# Patient Record
Sex: Female | Born: 1945 | Race: White | Hispanic: No | Smoking: Former smoker
Health system: Southern US, Community
[De-identification: ages and names within clinical notes are randomized; demographics above are authoritative.]

## PROBLEM LIST (undated history)

## (undated) DIAGNOSIS — I10 Essential (primary) hypertension: Secondary | ICD-10-CM

## (undated) DIAGNOSIS — R0600 Dyspnea, unspecified: Secondary | ICD-10-CM

## (undated) DIAGNOSIS — C801 Malignant (primary) neoplasm, unspecified: Secondary | ICD-10-CM

## (undated) DIAGNOSIS — J449 Chronic obstructive pulmonary disease, unspecified: Secondary | ICD-10-CM

## (undated) DIAGNOSIS — K219 Gastro-esophageal reflux disease without esophagitis: Secondary | ICD-10-CM

## (undated) DIAGNOSIS — E119 Type 2 diabetes mellitus without complications: Secondary | ICD-10-CM

## (undated) DIAGNOSIS — E785 Hyperlipidemia, unspecified: Secondary | ICD-10-CM

## (undated) DIAGNOSIS — Z8489 Family history of other specified conditions: Secondary | ICD-10-CM

## (undated) DIAGNOSIS — G43909 Migraine, unspecified, not intractable, without status migrainosus: Secondary | ICD-10-CM

## (undated) DIAGNOSIS — J189 Pneumonia, unspecified organism: Secondary | ICD-10-CM

## (undated) HISTORY — PX: DENTAL SURGERY: SHX609

## (undated) HISTORY — PX: BREAST BIOPSY: SHX20

## (undated) HISTORY — PX: RETINAL TEAR REPAIR CRYOTHERAPY: SHX5304

## (undated) HISTORY — PX: COLON SURGERY: SHX602

## (undated) HISTORY — DX: Chronic obstructive pulmonary disease, unspecified: J44.9

## (undated) HISTORY — PX: BREAST LUMPECTOMY: SHX2

## (undated) HISTORY — DX: Migraine, unspecified, not intractable, without status migrainosus: G43.909

## (undated) HISTORY — DX: Hyperlipidemia, unspecified: E78.5

## (undated) HISTORY — DX: Type 2 diabetes mellitus without complications: E11.9

## (undated) HISTORY — DX: Essential (primary) hypertension: I10

## (undated) HISTORY — PX: BREAST EXCISIONAL BIOPSY: SUR124

---

## 2002-10-17 HISTORY — PX: BUNIONECTOMY: SHX129

## 2010-01-25 LAB — PULMONARY FUNCTION TEST

## 2011-01-04 ENCOUNTER — Encounter: Payer: Self-pay | Admitting: Pulmonary Disease

## 2011-01-05 ENCOUNTER — Encounter: Payer: Self-pay | Admitting: Pulmonary Disease

## 2011-01-05 ENCOUNTER — Ambulatory Visit (INDEPENDENT_AMBULATORY_CARE_PROVIDER_SITE_OTHER): Payer: 59 | Admitting: Pulmonary Disease

## 2011-01-05 DIAGNOSIS — J449 Chronic obstructive pulmonary disease, unspecified: Secondary | ICD-10-CM | POA: Insufficient documentation

## 2011-01-05 DIAGNOSIS — J438 Other emphysema: Secondary | ICD-10-CM

## 2011-01-05 MED ORDER — FLUTICASONE-SALMETEROL 100-50 MCG/DOSE IN AEPB: 1.0000 | INHALATION_SPRAY | Freq: Two times a day (BID) | RESPIRATORY_TRACT | Status: DC

## 2011-01-05 MED ORDER — ALBUTEROL SULFATE HFA 108 (90 BASE) MCG/ACT IN AERS: 2.0000 | INHALATION_SPRAY | Freq: Four times a day (QID) | RESPIRATORY_TRACT | Status: DC | PRN

## 2011-01-05 NOTE — Progress Notes (Signed)
  Subjective:    Patient ID: Yolanda Morris, female    DOB: [birth year], 65 y.o.   MRN: [id number]  HPI The pt is a 64y/o female who I have been asked to see for management of emphysema.  She was diagnosed in 2009 by Dr Gerome Apley in [place], and I have yet to get his records.  She tells me she has "severe" disease.  She has fairly good exercise tolerance, and gets on treadmill everyday.  No sob with ADL's, but will get sob when around cleaning liquids.  She currently is on advair, and rarely uses rescue inhaler.  Is not on oxygen.  She has not had an exac in over a year, and no recent chest infection.  Denies chronic cough  Or LE edema.  Her last pfts were a year ago.   Review of Systems Pt c/o shortness of breath with activity, non-productive cough, sore throat and ear ache.  Pt denies shortness of breath at rest, productive cough, coughing up blood, chest pain, irregular heartbeats, acid heartburn, indigestion, loss of appetite, weight change, abdominal pain, difficulty swallowing, tooth/dental problems, headache, nasal congestion, sneezing, itching, anxiety, depression, hand/feet swelling, joint stiffness or pain, rash or change in color of mucus.      Objective:   Physical Exam  Constitutional: She is oriented to person, place, and time. She appears well-developed and well-nourished.  HENT:  Nose: Nose normal.  Mouth/Throat: Oropharynx is clear and moist. No oropharyngeal exudate.  Eyes: EOM are normal. Pupils are equal, round, and reactive to light. Left eye exhibits no discharge.  Neck: Neck supple. No JVD present. No thyromegaly present.  Cardiovascular: Normal rate, regular rhythm and normal heart sounds.  Exam reveals no gallop and no friction rub.   No murmur heard. Pulmonary/Chest: Effort normal and breath sounds normal. No respiratory distress. She has no wheezes. She has no rales.  Abdominal: Soft. Bowel sounds are normal. There is no tenderness.  Musculoskeletal: She exhibits no  edema.  Lymphadenopathy:    She has no cervical adenopathy.  Neurological: She is alert and oriented to person, place, and time.       Moves all 4 without obvious deficits  Skin:       No cyanosis           Assessment & Plan:  Emphysema The pt has a history of significant emphysema by her history, but I have yet to get records from her prior pulmonologist.  She is doing quite well on current meds, with last exacerb over a year ago.  She has not been tried on spiriva, and may be worthwhile giving her a short trial on this med.  She is exercising on her own, but I have asked her to consider pulm rehab program at [facility]  Overall, is doing well from a QOL standpoint.  Will also need to see if she has had an AAT level sent in the past?

## 2011-01-05 NOTE — Progress Notes (Deleted)
  Subjective:    Patient ID: Yolanda Morris, female    DOB: 10-19-1945, 65 y.o.   MRN: [id number]  HPI    Review of Systems Pt c/o shortness of breath with activity, non-productive cough, sore throat and ear ache. Pt denies shortness of breath at rest, productive cough, coughing up blood, chest pain, irregular heartbeats, acid heartburn, indigestion, loss of appetite, weight change, abdominal pain, difficulty swallowing, tooth/dental problems, headache, nasal congestion, sneezing, itching, anxiety, depression, hand/feet swelling, joint stiffness or pain, rash or change in color of mucus.     Objective:   Physical Exam        Assessment & Plan:

## 2011-01-05 NOTE — Progress Notes (Signed)
Addended by: Arman Filter on: 01/05/2011 11:37 AM   Modules accepted: Orders

## 2011-01-05 NOTE — Assessment & Plan Note (Signed)
The pt has a history of significant emphysema by her history, but I have yet to get records from her prior pulmonologist.  She is doing quite well on current meds, with last exacerb over a year ago.  She has not been tried on spiriva, and may be worthwhile giving her a short trial on this med.  She is exercising on her own, but I have asked her to consider pulm rehab program at [facility].  Overall, is doing well from a QOL standpoint.  Will also need to see if she has had an AAT level sent in the past?

## 2011-01-05 NOTE — Patient Instructions (Addendum)
Continue with current advair  Trial of spiriva one inhalation each am.  Call if helpful and we can send in script Think about pulmonary rehab program at [place]. Work on weight loss followup with me in 6mos, but sooner if having problems.  I will call if something additional needs to be done after getting records from Dr. Gerome Apley.

## 2011-01-07 ENCOUNTER — Telehealth: Payer: Self-pay | Admitting: Pulmonary Disease

## 2011-01-07 NOTE — Telephone Encounter (Signed)
Megan please advise if you have received these records [facility], [place]

## 2011-01-07 NOTE — Telephone Encounter (Signed)
Called and spoke with pt.  Informed her I still haven't received records from Dr. Eulis Foster.  Refaxed ROI to Dr. Eulis Foster office's. Informed pt I will call her once I receive these records.  Pt states she does have some records at home and will check to see if any of it is records from Dr. Eulis Foster and then will call me back and let me know.

## 2011-01-11 ENCOUNTER — Telehealth: Payer: Self-pay | Admitting: *Deleted

## 2011-01-11 NOTE — Telephone Encounter (Signed)
LMOM to inform pt we finally received records from [facility].

## 2011-01-11 NOTE — Telephone Encounter (Signed)
Pt called back. Informed her we received records.  Nothing further needed.

## 2011-01-12 ENCOUNTER — Telehealth: Payer: Self-pay | Admitting: Pulmonary Disease

## 2011-01-13 ENCOUNTER — Telehealth: Payer: Self-pay | Admitting: Pulmonary Disease

## 2011-01-13 NOTE — Telephone Encounter (Signed)
.... (  cont. From prev documentation)  Pt states she will check through her records she has at home to see if Dr. Eulis Foster checked an AAT.  Pt states she will call me if she finds this results.  Otherwise, pt states she will come to the lab tomorrow to have bloodwork drawn.

## 2011-01-13 NOTE — Telephone Encounter (Signed)
[id number] 

## 2011-01-13 NOTE — Telephone Encounter (Signed)
Returning Megans call. 

## 2011-01-13 NOTE — Telephone Encounter (Signed)
Spoke with pt and she states that she never had alpha1 done and so she will come to the lab tomorrow am and have this done as per previous conversation with [place].

## 2011-01-13 NOTE — Telephone Encounter (Signed)
Called and spoke with pt.  Pt aware of KC's recs.  Pt thought Dr. Eulis Foster did an AAT level on her.  I informed pt the records that Dr. Eulis Foster sent didn't have an AAT.  Pt states she will check

## 2011-01-14 ENCOUNTER — Other Ambulatory Visit: Payer: 59

## 2011-01-14 ENCOUNTER — Other Ambulatory Visit: Payer: Self-pay | Admitting: Pulmonary Disease

## 2011-01-18 LAB — ALPHA-1-ANTITRYPSIN: A-1 Antitrypsin, Ser: 117 mg/dL (ref 83–200)

## 2011-01-19 ENCOUNTER — Encounter: Payer: Self-pay | Admitting: Pulmonary Disease

## 2011-01-26 ENCOUNTER — Telehealth: Payer: Self-pay | Admitting: *Deleted

## 2011-01-26 MED ORDER — TIOTROPIUM BROMIDE MONOHYDRATE 18 MCG IN CAPS: 18.0000 ug | ORAL_CAPSULE | Freq: Every day | RESPIRATORY_TRACT | Status: DC

## 2011-01-26 NOTE — Telephone Encounter (Signed)
Called and spoke with pt.  Pt aware rx sent to [facility] for 90 day supply. And to keep 6 month f/u appt but call if any issues arise.

## 2011-01-26 NOTE — Telephone Encounter (Signed)
Per KC, need to inform pt AAT results were normal. [id number]

## 2011-01-26 NOTE — Telephone Encounter (Signed)
Called and spoke with pt. Pt aware of AAT results.  Pt also wanted to give [facility] an update.  Pt states she was started on Spiriva at last ov and was told to call back and give update.  Pt states she feels the spiriva is helping her breathing.  Pt states she is has joined an exercise group with her church and has noticed she is able to stay on the treadmill longer.  Therefore, pt is requesting rx for Spiriva (90 day supply) be sent to [facility], please advise if this is ok or not.  Thank you.

## 2011-01-26 NOTE — Telephone Encounter (Signed)
Ok to call in spiriva.  Tell her to keep 6mos f/u apptm, and let me know if having issues.

## 2011-06-09 ENCOUNTER — Other Ambulatory Visit (HOSPITAL_COMMUNITY)
Admission: RE | Admit: 2011-06-09 | Discharge: 2011-06-09 | Disposition: A | Discharge status: Home / Self Care | Payer: Medicare Other | Source: Ambulatory Visit | Attending: Family Medicine | Admitting: Family Medicine

## 2011-06-09 DIAGNOSIS — Z0000 Encounter for general adult medical examination without abnormal findings: Secondary | ICD-10-CM | POA: Insufficient documentation

## 2011-07-08 ENCOUNTER — Telehealth: Payer: Self-pay | Admitting: Pulmonary Disease

## 2011-07-08 ENCOUNTER — Encounter: Payer: Self-pay | Admitting: Pulmonary Disease

## 2011-07-08 ENCOUNTER — Ambulatory Visit (INDEPENDENT_AMBULATORY_CARE_PROVIDER_SITE_OTHER): Payer: Medicare Other | Admitting: Pulmonary Disease

## 2011-07-08 DIAGNOSIS — J209 Acute bronchitis, unspecified: Secondary | ICD-10-CM | POA: Insufficient documentation

## 2011-07-08 DIAGNOSIS — J438 Other emphysema: Secondary | ICD-10-CM

## 2011-07-08 MED ORDER — CEFDINIR 300 MG PO CAPS: ORAL_CAPSULE | ORAL | Status: DC

## 2011-07-08 MED ORDER — TIOTROPIUM BROMIDE MONOHYDRATE 18 MCG IN CAPS: 18.0000 ug | ORAL_CAPSULE | Freq: Every day | RESPIRATORY_TRACT | Status: DC

## 2011-07-08 MED ORDER — HYDROCODONE-HOMATROPINE 5-1.5 MG/5ML PO SYRP: 5.0000 mL | ORAL_SOLUTION | Freq: Four times a day (QID) | ORAL | Status: AC | PRN

## 2011-07-08 MED ORDER — FLUTICASONE-SALMETEROL 100-50 MCG/DOSE IN AEPB: 1.0000 | INHALATION_SPRAY | Freq: Two times a day (BID) | RESPIRATORY_TRACT | Status: DC

## 2011-07-08 NOTE — Telephone Encounter (Signed)
Pt is aware prescriptions have been sent to [facility] as requested.

## 2011-07-08 NOTE — Assessment & Plan Note (Signed)
The patient had been doing very well from an emphysema standpoint on her current meds prior to getting sick.  I've asked her to stay on her current medical regimen, and to followup with me in 6 months.  She has already received her flu shot this fall from her primary care physician.

## 2011-07-08 NOTE — Progress Notes (Signed)
  Subjective:    Patient ID: Yolanda Morris, female    DOB: 19-Nov-1945, 65 y.o.   MRN: [id number]  HPI Patient comes in today for followup of her known emphysema.  She been doing very well on her Advair and Spiriva for the summer, and had not had an acute exacerbation or pulmonary infection.  2 weeks ago she developed a runny nose and sore throat, followed by chest congestion, and now cough with purulent mucus.  She has been trying over-the-counter medications at home without improvement.  She denies any fevers, chills, or sweats.  She has noticed only some increasing shortness of breath since being sick.   Review of Systems  Constitutional: Negative for fever and unexpected weight change.  HENT: Positive for sore throat, rhinorrhea and sneezing. Negative for ear pain, nosebleeds, congestion, trouble swallowing, dental problem, postnasal drip and sinus pressure.   Eyes: Negative for redness and itching.  Respiratory: Positive for cough, chest tightness, shortness of breath and wheezing.   Cardiovascular: Negative for palpitations and leg swelling.  Gastrointestinal: Negative for nausea and vomiting.  Genitourinary: Negative for dysuria.  Musculoskeletal: Negative for joint swelling.  Skin: Negative for rash.  Neurological: Positive for headaches.  Hematological: Bruises/bleeds easily.  Psychiatric/Behavioral: Negative for dysphoric mood. The patient is not nervous/anxious.        Objective:   Physical Exam Well-developed female in no acute distress Nose without purulence or discharge, oropharynx clear Chest with mild decrease in breath sounds, no wheezes or rhonchi Cardiac exam with regular rate and rhythm Lower extremities without edema, no cyanosis noted Alert and oriented, moves all 4 extremities.       Assessment & Plan:

## 2011-07-08 NOTE — Assessment & Plan Note (Signed)
The patient has had a congested cough for the last 2 weeks, and now productive of yellow mucus.  More than likely this is a bacterial infection given its length of time, and I would also lean toward treatment with an antibiotic given her underlying lung disease.  We'll give her 5 days of Omnicef, and she is to call us if she does not see improvement.

## 2011-07-08 NOTE — Patient Instructions (Addendum)
Will treat with omnicef 300mg  two each am for next 5 days Can use cough syrup as needed for cough at night No change in breathing meds. followup with me in 6mos.

## 2011-07-12 ENCOUNTER — Telehealth: Payer: Self-pay | Admitting: Pulmonary Disease

## 2011-07-12 MED ORDER — PREDNISONE 10 MG PO TABS: ORAL_TABLET | ORAL | Status: DC

## 2011-07-12 MED ORDER — LEVOFLOXACIN 750 MG PO TABS: 750.0000 mg | ORAL_TABLET | Freq: Every day | ORAL | Status: DC

## 2011-07-12 MED ORDER — LEVOFLOXACIN 750 MG PO TABS: 750.0000 mg | ORAL_TABLET | Freq: Every day | ORAL | Status: AC

## 2011-07-12 NOTE — Telephone Encounter (Signed)
Error.Yolanda Morris ° °

## 2011-07-12 NOTE — Telephone Encounter (Signed)
Ok to call in levquin 750mg  one a day for 5 days Prednisone 10mg , 4 each day for 2 days, then 3 each day for 2 days, then 2 each day for 2 days.  Then stop

## 2011-07-12 NOTE — Telephone Encounter (Signed)
Spoke with pt and notified of recs per [facility]. She verbalized understanding and rxs were sent to pharm.

## 2011-07-12 NOTE — Telephone Encounter (Signed)
levaquin and pred taper were sent earlier to mail pharm, [facility].  I have now resent these to [facility] on [address].  I called pt and informed her rxs have now been sent to [facility] on [address] and will call [facility] and cancel the rxs so they will not be mailed.  I apologized for this inconvenience.  She verbalized understanding and was ok with this.  I called [facility] and spoke with Bernette Redbird, Pharmacist.  I advised to pls cancel the rxs for levaquin and pred taper as they were sent in error.  He verbalized understanding of this.

## 2011-07-12 NOTE — Telephone Encounter (Signed)
Spoke with pt. She was last seen on 07/08/11- was txed with 5 days of omnicef, last dose is today and she states cough no better. Still producing sputum, moderate amount that has changed from yellow to green in color. No fever, wheeze, or change in her breathing. She states feels achy, but this is no change since seen last wk. She states will come in for ov if [facility] thinks necc, but prefers not to since just seen. KC, pls advise, thanks! Allergies  Allergen Reactions  . Bacitracin     rash

## 2011-12-05 ENCOUNTER — Other Ambulatory Visit: Payer: Self-pay | Admitting: Family Medicine

## 2011-12-05 DIAGNOSIS — Z1231 Encounter for screening mammogram for malignant neoplasm of breast: Secondary | ICD-10-CM

## 2011-12-06 DIAGNOSIS — H2510 Age-related nuclear cataract, unspecified eye: Secondary | ICD-10-CM | POA: Diagnosis not present

## 2011-12-27 ENCOUNTER — Ambulatory Visit
Admission: RE | Admit: 2011-12-27 | Discharge: 2011-12-27 | Disposition: A | Discharge status: Home / Self Care | Payer: Medicare Other | Source: Ambulatory Visit | Attending: Family Medicine | Admitting: Family Medicine

## 2011-12-27 DIAGNOSIS — Z1231 Encounter for screening mammogram for malignant neoplasm of breast: Secondary | ICD-10-CM

## 2012-01-06 ENCOUNTER — Encounter: Payer: Self-pay | Admitting: Pulmonary Disease

## 2012-01-06 ENCOUNTER — Ambulatory Visit (INDEPENDENT_AMBULATORY_CARE_PROVIDER_SITE_OTHER): Payer: Medicare Other | Admitting: Pulmonary Disease

## 2012-01-06 DIAGNOSIS — J438 Other emphysema: Secondary | ICD-10-CM | POA: Diagnosis not present

## 2012-01-06 NOTE — Patient Instructions (Signed)
Will increase advair to 250/50 strength as a trial.  Let me know if works well for you, and we can send in prescription Ok to try coming off spiriva to see how you respond. Try to stay as active as possible, work on weight loss followup with me in 6mos.

## 2012-01-06 NOTE — Assessment & Plan Note (Signed)
The patient has done very well since her last visit, and feels that her breathing is at baseline.  She is asking about the cost of her medications, and whether she can get by on one medication alone.  I discussed with her the treatment of COPD and medical guidelines, and would try discontinuing the Spiriva to see if she can get by on Advair alone.  However, I think she needs to be on the 250/50 strength if she wishes to do this.  She will let me know how the trial goes.

## 2012-01-06 NOTE — Progress Notes (Signed)
  Subjective:    Patient ID: Yolanda Morris, female    DOB: [birth year], 66 y.o.   MRN: [id number]  HPI The patient comes in today for followup of her known COPD.  She has done fairly well since the last visit, and has not had a chest infection or acute exacerbation since that time.  She notices that her breathing is not as good when temperatures are very cold outside, and I discussed with her using a scarf or cloth of some kind to warm the air.    Review of Systems  Constitutional: Negative for fever and unexpected weight change.  HENT: Negative for ear pain, nosebleeds, congestion, sore throat, rhinorrhea, sneezing, trouble swallowing, dental problem, postnasal drip and sinus pressure.   Eyes: Positive for itching. Negative for redness.  Respiratory: Positive for shortness of breath. Negative for cough, chest tightness and wheezing.   Cardiovascular: Negative for palpitations and leg swelling.  Gastrointestinal: Negative for nausea and vomiting.  Genitourinary: Negative for dysuria.  Musculoskeletal: Negative for joint swelling.  Skin: Negative for rash.  Neurological: Positive for headaches.  Hematological: Bruises/bleeds easily.  Psychiatric/Behavioral: Negative for dysphoric mood. The patient is not nervous/anxious.        Objective:   Physical Exam Overweight female in no acute distress Nose without purulence or discharge noted Chest with mild decreased breath sounds, but adequate air flow, no wheezing Cardiac exam with regular rate and rhythm Lower extremities without edema, cyanosis Alert and oriented, moves all 4 extremities.       Assessment & Plan:

## 2012-01-09 ENCOUNTER — Telehealth: Payer: Self-pay | Admitting: Pulmonary Disease

## 2012-01-09 NOTE — Telephone Encounter (Signed)
I spoke with pt and she states when she was here on 01/06/12 she forgot to mention that she just received a 3 month supply of the advair 150. She is wanting to know can she finish out this supply or does KC want her to go ahead and start the advair 250. Please advise Dr. Shelle Iron, thanks

## 2012-01-09 NOTE — Telephone Encounter (Signed)
It is ok for her to use up the supply of lower dose advair, but if her breathing worsens off spiriva, need to get on the advair 250/50.

## 2012-01-09 NOTE — Telephone Encounter (Signed)
Yolanda Morris spoke with pt.

## 2012-01-09 NOTE — Telephone Encounter (Signed)
I spoke with pt and is aware of KC recs. She voiced her understanding and had no questions 

## 2012-01-12 DIAGNOSIS — I10 Essential (primary) hypertension: Secondary | ICD-10-CM | POA: Diagnosis not present

## 2012-01-12 DIAGNOSIS — E785 Hyperlipidemia, unspecified: Secondary | ICD-10-CM | POA: Diagnosis not present

## 2012-01-12 DIAGNOSIS — J449 Chronic obstructive pulmonary disease, unspecified: Secondary | ICD-10-CM | POA: Diagnosis not present

## 2012-01-12 DIAGNOSIS — E119 Type 2 diabetes mellitus without complications: Secondary | ICD-10-CM | POA: Diagnosis not present

## 2012-01-12 DIAGNOSIS — Z86010 Personal history of colonic polyps: Secondary | ICD-10-CM | POA: Diagnosis not present

## 2012-02-15 ENCOUNTER — Telehealth: Payer: Self-pay | Admitting: Pulmonary Disease

## 2012-02-15 NOTE — Telephone Encounter (Signed)
I do not want her to be OFF advair She can restart the spiriva, but I think she needs to be either on the 250/50 or 100/50 of advair at the same time.

## 2012-02-15 NOTE — Telephone Encounter (Signed)
I spoke with pt and she states since being on the lower dose of advair 150 she has noticed her breathing has became worse. This morning she restarted the spiriva and has a 30 day supply of this to see if this will help. She states she is going to take this and once this finishes she will start he samples of the advair 250-50. Pt states she just wanted to give [facility] an heads up on what she is doing. Will forward to [facility] so he is aware.

## 2012-02-15 NOTE — Telephone Encounter (Signed)
I spoke with pt and she states she will keep taking the advair 100-50 with the spiriva this month and then in June she is going to start the advair 250-50 since she has enough samples for one month. She states the reason is bc the insurance will cover the advair 250-50 starting July. Will forward back to [facility] as an [place]

## 2012-02-16 NOTE — Telephone Encounter (Signed)
Because this is an FYI, will sign and forward to [facility].

## 2012-03-23 ENCOUNTER — Telehealth: Payer: Self-pay | Admitting: Pulmonary Disease

## 2012-03-23 MED ORDER — FLUTICASONE-SALMETEROL 250-50 MCG/DOSE IN AEPB: 1.0000 | INHALATION_SPRAY | Freq: Two times a day (BID) | RESPIRATORY_TRACT | Status: DC

## 2012-03-23 NOTE — Telephone Encounter (Signed)
Patient is requesting RX for Advair 250/50 which she has been taking along with the Spiriva and is doing well. She requests new RX be sent to [facility] for 90 day supply with refills. This has been done for the pt.

## 2012-04-12 DIAGNOSIS — Z09 Encounter for follow-up examination after completed treatment for conditions other than malignant neoplasm: Secondary | ICD-10-CM | POA: Diagnosis not present

## 2012-04-12 DIAGNOSIS — Z86010 Personal history of colonic polyps: Secondary | ICD-10-CM | POA: Diagnosis not present

## 2012-06-01 DIAGNOSIS — N390 Urinary tract infection, site not specified: Secondary | ICD-10-CM | POA: Diagnosis not present

## 2012-06-01 DIAGNOSIS — R350 Frequency of micturition: Secondary | ICD-10-CM | POA: Diagnosis not present

## 2012-06-01 DIAGNOSIS — J449 Chronic obstructive pulmonary disease, unspecified: Secondary | ICD-10-CM | POA: Diagnosis not present

## 2012-07-09 ENCOUNTER — Ambulatory Visit: Payer: Medicare Other | Admitting: Pulmonary Disease

## 2012-07-10 DIAGNOSIS — Z23 Encounter for immunization: Secondary | ICD-10-CM | POA: Diagnosis not present

## 2012-07-10 DIAGNOSIS — E785 Hyperlipidemia, unspecified: Secondary | ICD-10-CM | POA: Diagnosis not present

## 2012-07-10 DIAGNOSIS — I10 Essential (primary) hypertension: Secondary | ICD-10-CM | POA: Diagnosis not present

## 2012-07-10 DIAGNOSIS — J449 Chronic obstructive pulmonary disease, unspecified: Secondary | ICD-10-CM | POA: Diagnosis not present

## 2012-07-10 DIAGNOSIS — E119 Type 2 diabetes mellitus without complications: Secondary | ICD-10-CM | POA: Diagnosis not present

## 2012-07-10 DIAGNOSIS — Z124 Encounter for screening for malignant neoplasm of cervix: Secondary | ICD-10-CM | POA: Diagnosis not present

## 2012-07-10 DIAGNOSIS — Z0000 Encounter for general adult medical examination without abnormal findings: Secondary | ICD-10-CM | POA: Diagnosis not present

## 2012-07-10 DIAGNOSIS — Z1331 Encounter for screening for depression: Secondary | ICD-10-CM | POA: Diagnosis not present

## 2012-07-10 DIAGNOSIS — M810 Age-related osteoporosis without current pathological fracture: Secondary | ICD-10-CM | POA: Diagnosis not present

## 2012-07-11 ENCOUNTER — Encounter: Payer: Self-pay | Admitting: Pulmonary Disease

## 2012-07-11 ENCOUNTER — Ambulatory Visit (INDEPENDENT_AMBULATORY_CARE_PROVIDER_SITE_OTHER): Payer: Medicare Other | Admitting: Pulmonary Disease

## 2012-07-11 VITALS — BP 120/64 | HR 71 | Temp 97.5°F | Ht 64.0 in | Wt 157.6 lb

## 2012-07-11 DIAGNOSIS — J449 Chronic obstructive pulmonary disease, unspecified: Secondary | ICD-10-CM

## 2012-07-11 NOTE — Progress Notes (Signed)
  Subjective:    Patient ID: Yolanda Morris, female    DOB: 13-Dec-1945, 66 y.o.   MRN: [id number]  HPI The patient comes in today for followup of her known COPD.  She has been fairly stable since the last visit, but did have to stop taking Spiriva because of medication cost.  She has seen a difference in her breathing and mucus production since being off the medication.  She has been trying to stay active, and is interested in attending pulmonary rehabilitation.  She is also noting some postnasal drip with a cough productive of slightly tinted mucus.  She has not had an acute exacerbation since the last visit.  She has received a flu shot from her primary care physician.   Review of Systems  Constitutional: Negative for fever and unexpected weight change.  HENT: Positive for rhinorrhea, sneezing and postnasal drip. Negative for ear pain, nosebleeds, congestion, sore throat, trouble swallowing, dental problem and sinus pressure.   Eyes: Negative for redness and itching.  Respiratory: Positive for cough and shortness of breath. Negative for chest tightness and wheezing.   Cardiovascular: Negative for palpitations and leg swelling.  Gastrointestinal: Negative for nausea and vomiting.  Genitourinary: Negative for dysuria.  Musculoskeletal: Negative for joint swelling.  Skin: Negative for rash.  Neurological: Positive for headaches.  Hematological: Does not bruise/bleed easily.  Psychiatric/Behavioral: Negative for dysphoric mood. The patient is not nervous/anxious.        Objective:   Physical Exam Well-developed female in no acute distress Nose without purulence or discharge noted Chest with decreased breath sounds throughout, no wheezing Cardiac exam with regular rate and rhythm Lower extremities without significant edema, no cyanosis Alert and oriented, moves all 4 extremities.       Assessment & Plan:

## 2012-07-11 NOTE — Assessment & Plan Note (Addendum)
PFT's 2011:  FEV1 1.25 (54%), ratio 52, +airtrapping, DLCO 77% Cleda Daub 06/2012:  FEV1 1.00 (43%), ratio 51%   The patient is maintaining a reasonable baseline on Advair alone, but clearly had less symptoms when Spiriva was added to to this medication.  Unfortunately, she will not qualify for any patient assistance programs, and she will let me know when she can possibly afford going back on Spiriva with her Advair.  I will refer her to pulmonary rehabilitation, and I've asked her to work on weight loss as well. Her spiro today shows a decline in FEV1, but only a little more than expected for natural aging.

## 2012-07-11 NOTE — Patient Instructions (Addendum)
Stay on advair Will refer to pulmonary rehab at [place] Work on weight reduction followup with me in 6mos.

## 2012-07-12 DIAGNOSIS — Z23 Encounter for immunization: Secondary | ICD-10-CM | POA: Diagnosis not present

## 2012-07-14 DIAGNOSIS — R21 Rash and other nonspecific skin eruption: Secondary | ICD-10-CM | POA: Diagnosis not present

## 2012-08-06 DIAGNOSIS — J449 Chronic obstructive pulmonary disease, unspecified: Secondary | ICD-10-CM | POA: Diagnosis not present

## 2012-08-06 DIAGNOSIS — J40 Bronchitis, not specified as acute or chronic: Secondary | ICD-10-CM | POA: Diagnosis not present

## 2012-09-19 ENCOUNTER — Encounter (HOSPITAL_COMMUNITY): Payer: Self-pay

## 2012-09-19 ENCOUNTER — Encounter (HOSPITAL_COMMUNITY)
Admission: RE | Admit: 2012-09-19 | Discharge: 2012-09-19 | Disposition: A | Discharge status: Home / Self Care | Payer: Medicare Other | Source: Ambulatory Visit | Attending: Pulmonary Disease | Admitting: Pulmonary Disease

## 2012-09-19 DIAGNOSIS — R0602 Shortness of breath: Secondary | ICD-10-CM | POA: Insufficient documentation

## 2012-09-19 DIAGNOSIS — Z5189 Encounter for other specified aftercare: Secondary | ICD-10-CM | POA: Insufficient documentation

## 2012-09-19 DIAGNOSIS — R05 Cough: Secondary | ICD-10-CM | POA: Insufficient documentation

## 2012-09-19 DIAGNOSIS — R059 Cough, unspecified: Secondary | ICD-10-CM | POA: Insufficient documentation

## 2012-09-19 DIAGNOSIS — J45909 Unspecified asthma, uncomplicated: Secondary | ICD-10-CM | POA: Insufficient documentation

## 2012-09-19 NOTE — Progress Notes (Addendum)
Yolanda Morris came today for orientation to Pulmonary Rehab.  On arrival, she was very hoarse, talking in whispers,  Having increased cough, non productive.  She also has had some general aching and more dyspnea with exertion.   Dr. Teddy Spike office notified of the above and appointment made with Yolanda Morris for tomorrow at 10:45 am.  Yolanda Morris has not used her rescue inhaler and does not have it with her today.  She was instructed to used when she returned home, use saline gargle and nasal irrigation and rest.  We did review her health history and medications,  Demonstration and practice of PLB using pulse oximeter.  Patient able to return demonstration satisfactorily. Safety and hand hygiene in the exercise area reviewed with patient.  Patient voices understanding.   Goals were set for Pulmonary Rehab.  We did not do 6 min walk test today, will do on her first day of exercise.  She is advised to wait at least one week until she if fully recovered from this current illness.  We look forward to working with this very nice lady.   Total time one on one 55 minutes.  Cathie Olden RN

## 2012-09-20 ENCOUNTER — Ambulatory Visit (INDEPENDENT_AMBULATORY_CARE_PROVIDER_SITE_OTHER): Payer: Medicare Other | Admitting: Adult Health

## 2012-09-20 ENCOUNTER — Encounter: Payer: Self-pay | Admitting: Adult Health

## 2012-09-20 ENCOUNTER — Encounter (HOSPITAL_COMMUNITY): Admission: RE | Admit: 2012-09-20 | Payer: Medicare Other | Source: Ambulatory Visit

## 2012-09-20 VITALS — BP 134/78 | HR 87 | Temp 97.6°F | Ht 63.5 in | Wt 153.0 lb

## 2012-09-20 DIAGNOSIS — J449 Chronic obstructive pulmonary disease, unspecified: Secondary | ICD-10-CM | POA: Diagnosis not present

## 2012-09-20 MED ORDER — AMOXICILLIN-POT CLAVULANATE 875-125 MG PO TABS: 1.0000 | ORAL_TABLET | Freq: Two times a day (BID) | ORAL | Status: AC

## 2012-09-20 MED ORDER — PREDNISONE 10 MG PO TABS: ORAL_TABLET | ORAL | Status: DC

## 2012-09-20 NOTE — Assessment & Plan Note (Signed)
Exacerbation  xopenex in office given   Plan  Augmentin 875mg  Twice daily  For 7 days  Mucinex DM Twice daily  For cough/congestion  Prednisone taper over next week.  Fluids and rest .  Saline nasal rinses As needed   Please contact office for sooner follow up if symptoms do not improve or worsen or seek emergency care  follow up Dr. Shelle Iron as planned and As needed

## 2012-09-20 NOTE — Progress Notes (Signed)
  Subjective:    Patient ID: Yolanda Morris, female    DOB: Sep 01, 1946, 66 y.o.   MRN: [id number]  HPI 66 yr old female with known hx of COPD   09/20/2012 Acute OV  Complains of increased SOB, tightness in chest, prod cough with dark yellow/brown mucus, sore throat, head congestion w/ PND, hoarseness x4days - denies wheezing, f/c/s Mucinex is not helping.  Cough and congestion are getting worse.  No fever , chest pain , hemoptysis .     Review of Systems Constitutional:   No  weight loss, night sweats,  Fevers, chills, fatigue, or  lassitude.  HEENT:   No headaches,  Difficulty swallowing,  Tooth/dental problems, or  Sore throat,                No sneezing, itching, ear ache,  +nasal congestion, post nasal drip,   CV:  No chest pain,  Orthopnea, PND, swelling in lower extremities, anasarca, dizziness, palpitations, syncope.   GI  No heartburn, indigestion, abdominal pain, nausea, vomiting, diarrhea, change in bowel habits, loss of appetite, bloody stools.   Resp:    No coughing up of blood.   No chest wall deformity  Skin: no rash or lesions.  GU: no dysuria, change in color of urine, no urgency or frequency.  No flank pain, no hematuria   MS:  No joint pain or swelling.  No decreased range of motion.     Psych:  No change in mood or affect. No depression or anxiety.  No memory loss.        Objective:   Physical Exam GEN: A/Ox3; pleasant , NAD, well nourished   HEENT:  [place]/AT,  EACs-clear, TMs-wnl, NOSE-clear drainage THROAT-clear, no lesions, no postnasal drip or exudate noted.   NECK:  Supple w/ fair ROM; no JVD; normal carotid impulses w/o bruits; no thyromegaly or nodules palpated; no lymphadenopathy.  RESP  Coarse Rhonchi no accessory muscle use, no dullness to percussion  CARD:  RRR, no m/r/g  , no peripheral edema, pulses intact, no cyanosis or clubbing.  GI:   Soft & nt; nml bowel sounds; no organomegaly or masses detected.  Musco: Warm bil, no deformities  or joint swelling noted.   Neuro: alert, no focal deficits noted.    Skin: Warm, no lesions or rashes          Assessment & Plan:

## 2012-09-20 NOTE — Patient Instructions (Addendum)
Augmentin 875mg  Twice daily  For 7 days  Mucinex DM Twice daily  For cough/congestion  Prednisone taper over next week.  Fluids and rest .  Saline nasal rinses As needed   Please contact office for sooner follow up if symptoms do not improve or worsen or seek emergency care  follow up Dr. Shelle Iron as planned and As needed

## 2012-09-21 NOTE — Progress Notes (Signed)
Ov reviewed, and agree with above.

## 2012-09-25 ENCOUNTER — Encounter (HOSPITAL_COMMUNITY): Payer: Medicare Other

## 2012-09-27 ENCOUNTER — Encounter (HOSPITAL_COMMUNITY)
Admission: RE | Admit: 2012-09-27 | Discharge: 2012-09-27 | Disposition: A | Discharge status: Home / Self Care | Payer: Medicare Other | Source: Ambulatory Visit | Attending: Pulmonary Disease | Admitting: Pulmonary Disease

## 2012-09-27 DIAGNOSIS — Z5189 Encounter for other specified aftercare: Secondary | ICD-10-CM | POA: Diagnosis not present

## 2012-09-27 DIAGNOSIS — R05 Cough: Secondary | ICD-10-CM | POA: Diagnosis not present

## 2012-09-27 DIAGNOSIS — J45909 Unspecified asthma, uncomplicated: Secondary | ICD-10-CM | POA: Diagnosis not present

## 2012-09-27 DIAGNOSIS — R0602 Shortness of breath: Secondary | ICD-10-CM | POA: Diagnosis not present

## 2012-09-27 NOTE — Progress Notes (Signed)
First day of exercise in Pulmonary Rehab.  She had a delayed start due to exacerbation.  She is still somewhat hoarse.  Exercise limited to low level.  Tolerated well.  We will continue to encourage and support.  Cathie Olden RN

## 2012-10-02 ENCOUNTER — Encounter (HOSPITAL_COMMUNITY)
Admission: RE | Admit: 2012-10-02 | Discharge: 2012-10-02 | Disposition: A | Discharge status: Home / Self Care | Payer: Medicare Other | Source: Ambulatory Visit | Attending: Pulmonary Disease | Admitting: Pulmonary Disease

## 2012-10-02 DIAGNOSIS — R0602 Shortness of breath: Secondary | ICD-10-CM | POA: Diagnosis not present

## 2012-10-02 DIAGNOSIS — R05 Cough: Secondary | ICD-10-CM | POA: Diagnosis not present

## 2012-10-02 DIAGNOSIS — Z5189 Encounter for other specified aftercare: Secondary | ICD-10-CM | POA: Diagnosis not present

## 2012-10-02 DIAGNOSIS — J45909 Unspecified asthma, uncomplicated: Secondary | ICD-10-CM | POA: Diagnosis not present

## 2012-10-04 ENCOUNTER — Encounter (HOSPITAL_COMMUNITY)
Admission: RE | Admit: 2012-10-04 | Discharge: 2012-10-04 | Disposition: A | Discharge status: Home / Self Care | Payer: Medicare Other | Source: Ambulatory Visit | Attending: Pulmonary Disease | Admitting: Pulmonary Disease

## 2012-10-04 DIAGNOSIS — J45909 Unspecified asthma, uncomplicated: Secondary | ICD-10-CM | POA: Diagnosis not present

## 2012-10-04 DIAGNOSIS — R0602 Shortness of breath: Secondary | ICD-10-CM | POA: Diagnosis not present

## 2012-10-04 DIAGNOSIS — Z5189 Encounter for other specified aftercare: Secondary | ICD-10-CM | POA: Diagnosis not present

## 2012-10-04 DIAGNOSIS — R05 Cough: Secondary | ICD-10-CM | POA: Diagnosis not present

## 2012-10-09 ENCOUNTER — Encounter (HOSPITAL_COMMUNITY): Payer: Medicare Other

## 2012-10-11 ENCOUNTER — Encounter (HOSPITAL_COMMUNITY): Payer: Medicare Other

## 2012-10-16 ENCOUNTER — Other Ambulatory Visit: Payer: Self-pay | Admitting: Adult Health

## 2012-10-16 ENCOUNTER — Encounter (HOSPITAL_COMMUNITY)
Admission: RE | Admit: 2012-10-16 | Discharge: 2012-10-16 | Disposition: A | Discharge status: Home / Self Care | Payer: Medicare Other | Source: Ambulatory Visit | Attending: Pulmonary Disease | Admitting: Pulmonary Disease

## 2012-10-16 DIAGNOSIS — R05 Cough: Secondary | ICD-10-CM | POA: Diagnosis not present

## 2012-10-16 DIAGNOSIS — J45909 Unspecified asthma, uncomplicated: Secondary | ICD-10-CM | POA: Diagnosis not present

## 2012-10-16 DIAGNOSIS — Z5189 Encounter for other specified aftercare: Secondary | ICD-10-CM | POA: Diagnosis not present

## 2012-10-16 DIAGNOSIS — R0602 Shortness of breath: Secondary | ICD-10-CM | POA: Diagnosis not present

## 2012-10-16 NOTE — Progress Notes (Signed)
Completed home exercise with patient. Reviewed exercise progression, routine, exercising at a comfortable pace, RPE/Dyspnea scales, how important it is to own a pulse oximeter and how to use one, weather conditions, warning signs and symptoms with exercise, and CP/NTG. We discussed when to call MD. Patient voices understanding. Patient has a goal to  Improve her ADLs and her breathing. No how far she can push herself with exertion. Will continue to encourage and support.

## 2012-10-18 ENCOUNTER — Encounter (HOSPITAL_COMMUNITY)
Admission: RE | Admit: 2012-10-18 | Discharge: 2012-10-18 | Disposition: A | Discharge status: Home / Self Care | Payer: Medicare Other | Source: Ambulatory Visit | Attending: Pulmonary Disease | Admitting: Pulmonary Disease

## 2012-10-18 ENCOUNTER — Other Ambulatory Visit: Payer: Self-pay | Admitting: Pulmonary Disease

## 2012-10-18 DIAGNOSIS — R05 Cough: Secondary | ICD-10-CM | POA: Insufficient documentation

## 2012-10-18 DIAGNOSIS — R0602 Shortness of breath: Secondary | ICD-10-CM | POA: Diagnosis not present

## 2012-10-18 DIAGNOSIS — Z5189 Encounter for other specified aftercare: Secondary | ICD-10-CM | POA: Insufficient documentation

## 2012-10-18 DIAGNOSIS — R059 Cough, unspecified: Secondary | ICD-10-CM | POA: Insufficient documentation

## 2012-10-18 DIAGNOSIS — J45909 Unspecified asthma, uncomplicated: Secondary | ICD-10-CM | POA: Diagnosis not present

## 2012-10-18 MED ORDER — ALBUTEROL SULFATE HFA 108 (90 BASE) MCG/ACT IN AERS: 2.0000 | INHALATION_SPRAY | Freq: Four times a day (QID) | RESPIRATORY_TRACT | Status: DC | PRN

## 2012-10-18 MED ORDER — FLUTICASONE-SALMETEROL 250-50 MCG/DOSE IN AEPB: 1.0000 | INHALATION_SPRAY | Freq: Two times a day (BID) | RESPIRATORY_TRACT | Status: DC

## 2012-10-18 NOTE — Telephone Encounter (Signed)
Rx's have been sent, pt is aware.

## 2012-10-23 ENCOUNTER — Encounter (HOSPITAL_COMMUNITY)
Admission: RE | Admit: 2012-10-23 | Discharge: 2012-10-23 | Disposition: A | Discharge status: Home / Self Care | Payer: Medicare Other | Source: Ambulatory Visit | Attending: Pulmonary Disease | Admitting: Pulmonary Disease

## 2012-10-25 ENCOUNTER — Encounter (HOSPITAL_COMMUNITY)
Admission: RE | Admit: 2012-10-25 | Discharge: 2012-10-25 | Disposition: A | Discharge status: Home / Self Care | Payer: Medicare Other | Source: Ambulatory Visit | Attending: Pulmonary Disease | Admitting: Pulmonary Disease

## 2012-10-30 ENCOUNTER — Encounter (HOSPITAL_COMMUNITY)
Admission: RE | Admit: 2012-10-30 | Discharge: 2012-10-30 | Disposition: A | Discharge status: Home / Self Care | Payer: Medicare Other | Source: Ambulatory Visit | Attending: Pulmonary Disease | Admitting: Pulmonary Disease

## 2012-10-31 ENCOUNTER — Encounter: Payer: Self-pay | Admitting: Pulmonary Disease

## 2012-10-31 ENCOUNTER — Telehealth: Payer: Self-pay | Admitting: Pulmonary Disease

## 2012-10-31 NOTE — Telephone Encounter (Signed)
She is to stay on the advair, and caution her about using old medications. She was on spiriva in past, but stopped because she could not afford.

## 2012-10-31 NOTE — Telephone Encounter (Signed)
ATC NA unable to leave VM WCB 

## 2012-10-31 NOTE — Telephone Encounter (Signed)
Pt returned call. Yolanda Morris °

## 2012-10-31 NOTE — Telephone Encounter (Signed)
I spoke with pt. She stated she ran out of advair 250-50 waiting for her shipment from [facility]. So she decided to use her old advair 100/50 this morning. Reason being is because she was noticing chest tx. She also found an old box of spiriva KC had her on in the past with the advair and decided to use that as well bc per pt this helped her in the past. Today she should get her new adviar 250/50 and her rescue inhaler. Per pt it was [facility] fault reason being she is getting rx late and she ran out bc she ordered this on time. She just wanted to make [facility] aware of what she did do with old RX. Please advise KC thanks

## 2012-10-31 NOTE — Telephone Encounter (Signed)
Pt is advised.Remie Mathison, CMA  

## 2012-11-01 ENCOUNTER — Encounter (HOSPITAL_COMMUNITY)
Admission: RE | Admit: 2012-11-01 | Discharge: 2012-11-01 | Disposition: A | Discharge status: Home / Self Care | Payer: Medicare Other | Source: Ambulatory Visit | Attending: Pulmonary Disease | Admitting: Pulmonary Disease

## 2012-11-01 NOTE — Progress Notes (Signed)
Yolanda Morris's post exercise blood sugar was 51 post exercise.  Patient was asymptomatic.  Patient was given a glucose gel.  Repeat blood sugar 142.  Yolanda Morris takes metformin every evening.  Dr Michaelle Copas office called and notified. Will fax exercise flow sheets to Dr. Michaelle Copas office for review.  Yolanda Morris had a ham sandwich and fruit for lunch today.  Yolanda Morris will eat a little more prior to her next exercise session.

## 2012-11-06 ENCOUNTER — Encounter (HOSPITAL_COMMUNITY)
Admission: RE | Admit: 2012-11-06 | Discharge: 2012-11-06 | Disposition: A | Discharge status: Home / Self Care | Payer: Medicare Other | Source: Ambulatory Visit | Attending: Pulmonary Disease | Admitting: Pulmonary Disease

## 2012-11-08 ENCOUNTER — Encounter (HOSPITAL_COMMUNITY)
Admission: RE | Admit: 2012-11-08 | Discharge: 2012-11-08 | Disposition: A | Discharge status: Home / Self Care | Payer: Medicare Other | Source: Ambulatory Visit | Attending: Pulmonary Disease | Admitting: Pulmonary Disease

## 2012-11-13 ENCOUNTER — Encounter (HOSPITAL_COMMUNITY)
Admission: RE | Admit: 2012-11-13 | Discharge: 2012-11-13 | Disposition: A | Discharge status: Home / Self Care | Payer: Medicare Other | Source: Ambulatory Visit | Attending: Pulmonary Disease | Admitting: Pulmonary Disease

## 2012-11-13 NOTE — Progress Notes (Signed)
Pt post exercise CBG-66.  Pt given ginger ale.  Recheck 15 minutes later-119.  Pt asymptomatic.  CBG-140 prior to exercise.  Pt states she is holding her metformin and keeping journal at home. Pt reports her highest CBG-173, she did not work out the day before and she admits to eating cookies and coffee. Pt plans to share this information with Dr. Katrinka Blazing at upcoming appt.    Pt will continue her current regimen.

## 2012-11-15 ENCOUNTER — Encounter (HOSPITAL_COMMUNITY)
Admission: RE | Admit: 2012-11-15 | Discharge: 2012-11-15 | Disposition: A | Discharge status: Home / Self Care | Payer: Medicare Other | Source: Ambulatory Visit | Attending: Pulmonary Disease | Admitting: Pulmonary Disease

## 2012-11-20 ENCOUNTER — Encounter (HOSPITAL_COMMUNITY)
Admission: RE | Admit: 2012-11-20 | Discharge: 2012-11-20 | Disposition: A | Discharge status: Home / Self Care | Payer: Medicare Other | Source: Ambulatory Visit | Attending: Pulmonary Disease | Admitting: Pulmonary Disease

## 2012-11-20 DIAGNOSIS — R059 Cough, unspecified: Secondary | ICD-10-CM | POA: Diagnosis not present

## 2012-11-20 DIAGNOSIS — R0602 Shortness of breath: Secondary | ICD-10-CM | POA: Insufficient documentation

## 2012-11-20 DIAGNOSIS — Z5189 Encounter for other specified aftercare: Secondary | ICD-10-CM | POA: Diagnosis not present

## 2012-11-20 DIAGNOSIS — J45909 Unspecified asthma, uncomplicated: Secondary | ICD-10-CM | POA: Diagnosis not present

## 2012-11-20 DIAGNOSIS — R05 Cough: Secondary | ICD-10-CM | POA: Insufficient documentation

## 2012-11-22 ENCOUNTER — Encounter (HOSPITAL_COMMUNITY)
Admission: RE | Admit: 2012-11-22 | Discharge: 2012-11-22 | Disposition: A | Discharge status: Home / Self Care | Payer: Medicare Other | Source: Ambulatory Visit | Attending: Pulmonary Disease | Admitting: Pulmonary Disease

## 2012-11-23 DIAGNOSIS — E119 Type 2 diabetes mellitus without complications: Secondary | ICD-10-CM | POA: Diagnosis not present

## 2012-11-23 DIAGNOSIS — H02839 Dermatochalasis of unspecified eye, unspecified eyelid: Secondary | ICD-10-CM | POA: Diagnosis not present

## 2012-11-23 DIAGNOSIS — H2510 Age-related nuclear cataract, unspecified eye: Secondary | ICD-10-CM | POA: Diagnosis not present

## 2012-11-27 ENCOUNTER — Encounter (HOSPITAL_COMMUNITY)
Admission: RE | Admit: 2012-11-27 | Discharge: 2012-11-27 | Disposition: A | Discharge status: Home / Self Care | Payer: Medicare Other | Source: Ambulatory Visit | Attending: Pulmonary Disease | Admitting: Pulmonary Disease

## 2012-11-27 NOTE — Progress Notes (Addendum)
Yolanda Morris 67 y.o. female Time Spent: 40 minutes Nutrition Note Spoke with pt. Pt is overweight.  Pt eats 3 meals a day; most meals prepared at home.  There are some ways the pt can make her eating habits healthier.  Pt's Rate Your Plate results reviewed with pt.  Pt expressed understanding.  Pt does avoids some salty food; uses no added salt canned food "when available."  Pt does not add salt to food. Pt is diabetic. Pt has held her Metformin x "about 2 weeks" due to hypoglycemia during exercise. Pt checks CBG's 1 time a day.  CBG's reportedly 90-120 mg/dL.  Pt unable to recall recommended CBG ranges before meals.  Recommended CBG ranges before and after meals reviewed with pt. Pt expressed understanding of the information reviewed. Per discussion, pt wants to lose wt. 24 hour food recall completed with pt. Pt educated re: 1200 kcal diet and given 5-day, 1200 kcal menu idea handout.   24 hour food recall Breakfast 2 slices whole wheat toast with Promise margarine and black coffee  Lunch Ham sandwich on honey wheat bread with mustard, 1/2 apple with 1 TBSP Peanut Butter, and 5 green olives  Dinner 1.5 c salad with 4 TBSP [place] Onion dressing 1 cup white pasta 1.5 cup meat sauce Water  Bedtime snack Popcorn popped in oil Diet Coke  Nutrition Diagnosis   Food-and nutrition-related knowledge deficit related to lack of exposure to information as related to diagnosis of pulmonary disease   Overweight related to excessive energy intake as evidenced by a BMI of 26.2 Nutrition Rx/Est. Daily Nutrition Needs for: ? wt loss 1200-1300 Kcal  85-95 gm protein   1500 mg or less sodium     150 gm CHO Nutrition Intervention   Pt's individual nutrition plan and goals reviewed with pt.   Pt to remove Promise and 1 slice of whole wheat toast from breakfast and add protein and fruit   Add a whole piece of fruit and/or 1/2 piece of fruit and non-starchy vegetables to lunch.   Pt to incorporate more  whole grains in diet (e.g. Switch from white pasta to wheat pasta)   Pt to decrease salad dressing added to salads by placing salad dressing on the side and dipping her fork or measuring salad dressing used.    Benefits of adopting healthy eating habits discussed when pt's Rate Your Plate reviewed.   Pt to attend the Nutrition and Lung Disease class - met   Pt's PCP, Merri Brunette, notified re: pt holding Metformin and recommend A1c re-check in 3 months.   Continual client-centered nutrition education by RD, as part of interdisciplinary care. Goal(s) 1. Pt to identify and limit food sources of sodium. 2. Identify food quantities necessary to achieve wt loss of  -2# per week to a goal wt of 57.2-65.5 kg (126-144 lb) at graduation from pulmonary rehab. 3. Describe the benefit of including fruits, vegetables, whole grains, and low-fat dairy products in a healthy meal plan. Monitor and Evaluate progress toward nutrition goal with team.  Nutrition Risk: Low

## 2012-11-29 ENCOUNTER — Encounter (HOSPITAL_COMMUNITY): Payer: Medicare Other

## 2012-12-04 ENCOUNTER — Encounter (HOSPITAL_COMMUNITY)
Admission: RE | Admit: 2012-12-04 | Discharge: 2012-12-04 | Disposition: A | Discharge status: Home / Self Care | Payer: Medicare Other | Source: Ambulatory Visit | Attending: Pulmonary Disease | Admitting: Pulmonary Disease

## 2012-12-06 ENCOUNTER — Encounter (HOSPITAL_COMMUNITY)
Admission: RE | Admit: 2012-12-06 | Discharge: 2012-12-06 | Disposition: A | Discharge status: Home / Self Care | Payer: Medicare Other | Source: Ambulatory Visit | Attending: Pulmonary Disease | Admitting: Pulmonary Disease

## 2012-12-11 ENCOUNTER — Encounter (HOSPITAL_COMMUNITY)
Admission: RE | Admit: 2012-12-11 | Discharge: 2012-12-11 | Disposition: A | Discharge status: Home / Self Care | Payer: Medicare Other | Source: Ambulatory Visit | Attending: Pulmonary Disease | Admitting: Pulmonary Disease

## 2012-12-13 ENCOUNTER — Encounter (HOSPITAL_COMMUNITY)
Admission: RE | Admit: 2012-12-13 | Discharge: 2012-12-13 | Disposition: A | Discharge status: Home / Self Care | Payer: Medicare Other | Source: Ambulatory Visit | Attending: Pulmonary Disease | Admitting: Pulmonary Disease

## 2012-12-18 ENCOUNTER — Encounter (HOSPITAL_COMMUNITY)
Admission: RE | Admit: 2012-12-18 | Discharge: 2012-12-18 | Disposition: A | Discharge status: Home / Self Care | Payer: Medicare Other | Source: Ambulatory Visit | Attending: Pulmonary Disease | Admitting: Pulmonary Disease

## 2012-12-18 DIAGNOSIS — R05 Cough: Secondary | ICD-10-CM | POA: Diagnosis not present

## 2012-12-18 DIAGNOSIS — J45909 Unspecified asthma, uncomplicated: Secondary | ICD-10-CM | POA: Insufficient documentation

## 2012-12-18 DIAGNOSIS — R0602 Shortness of breath: Secondary | ICD-10-CM | POA: Insufficient documentation

## 2012-12-18 DIAGNOSIS — Z5189 Encounter for other specified aftercare: Secondary | ICD-10-CM | POA: Diagnosis not present

## 2012-12-18 DIAGNOSIS — R059 Cough, unspecified: Secondary | ICD-10-CM | POA: Insufficient documentation

## 2012-12-20 ENCOUNTER — Encounter (HOSPITAL_COMMUNITY)
Admission: RE | Admit: 2012-12-20 | Discharge: 2012-12-20 | Disposition: A | Discharge status: Home / Self Care | Payer: Medicare Other | Source: Ambulatory Visit | Attending: Pulmonary Disease | Admitting: Pulmonary Disease

## 2012-12-25 ENCOUNTER — Encounter (HOSPITAL_COMMUNITY)
Admission: RE | Admit: 2012-12-25 | Discharge: 2012-12-25 | Disposition: A | Discharge status: Home / Self Care | Payer: Medicare Other | Source: Ambulatory Visit | Attending: Pulmonary Disease | Admitting: Pulmonary Disease

## 2012-12-25 DIAGNOSIS — Z5189 Encounter for other specified aftercare: Secondary | ICD-10-CM | POA: Diagnosis not present

## 2012-12-25 DIAGNOSIS — R0602 Shortness of breath: Secondary | ICD-10-CM | POA: Diagnosis not present

## 2012-12-25 DIAGNOSIS — J45909 Unspecified asthma, uncomplicated: Secondary | ICD-10-CM | POA: Diagnosis not present

## 2012-12-25 DIAGNOSIS — R05 Cough: Secondary | ICD-10-CM | POA: Diagnosis not present

## 2012-12-27 ENCOUNTER — Encounter (HOSPITAL_COMMUNITY)
Admission: RE | Admit: 2012-12-27 | Discharge: 2012-12-27 | Disposition: A | Discharge status: Home / Self Care | Payer: Medicare Other | Source: Ambulatory Visit | Attending: Pulmonary Disease | Admitting: Pulmonary Disease

## 2012-12-27 DIAGNOSIS — Z5189 Encounter for other specified aftercare: Secondary | ICD-10-CM | POA: Diagnosis not present

## 2012-12-27 DIAGNOSIS — R0602 Shortness of breath: Secondary | ICD-10-CM | POA: Diagnosis not present

## 2012-12-27 DIAGNOSIS — R05 Cough: Secondary | ICD-10-CM | POA: Diagnosis not present

## 2012-12-27 NOTE — Progress Notes (Signed)
Teresea graduates today from Pulmonary rehab.  Navya plans to continue exercise at the [facility].  Wiley has a treadmill at home.

## 2013-01-01 ENCOUNTER — Encounter (HOSPITAL_COMMUNITY): Payer: Medicare Other

## 2013-01-02 NOTE — Progress Notes (Signed)
Pulmonary Rehabilitation Program Outcomes Report   Orientation:  09/19/12 Graduate Date:  12/28/11 Discharge Date:  12/28/11  # of sessions completed: 24  Pulmonologist: Clance Class Time:  1:30  A.  Exercise Program:  Tolerates exercise @ 2.61 METS for 45 minutes, Walk Test Results:  Pre: 1,090 ft and Post: 1,100 ft, Improved functional capacity  0.92 %, No Change  muscular strength  0.00 %, Improved dyspnea score 54.29 %, Improved education score 8.33 %, Discharged to home exercise program.  Anticipated compliance:  excellent and Discharged  B.  Mental Health:  Good mental attitude and Quality of Life (QOL)  changes:  Overall  +3.18 %, Health/Functioning +5.56 %, Socioeconomics +3.22 %, Psych/Spiritual +3.58 %, Family -4.00 %    C.  Education/Instruction/Skills  Uses Perceived Exertion Scale and/or Dyspnea Scale and Attended 8 education classes  Demonstrates accurate diaphragmatic breathing and pursed lip breathing. Home Exercise Given on 10-16-2012  D.  Nutrition/Weight Control/Body Composition:  % Body Fat  34.7 and Patient has gained .7 kg   E.  Blood Lipids    No results found for this basename: CHOL, HDL, LDLCALC, LDLDIRECT, TRIG, CHOLHDL    F.  Lifestyle Changes:  Making positive lifestyle changes  G.  Symptoms noted with exercise:  Hyperglycemic and Exertional hypertension  Report Completed By:  Yolanda Morris. Yolanda Thomann, MS, NASM, CES    Comments:  Pt has completed the pulmonary rehab undergrad accomplishing all program and personal goals. Pt tolerated well and did excellent with all exercises. Patient had no complaints. Pt plans to go to [employer] to continue her exercising. Patient overall quality of life has improved and so has her health and function scores. Patient was very compliant with her attendance and her home exercises. Exercise sessions have been scanned in.

## 2013-01-03 ENCOUNTER — Encounter (HOSPITAL_COMMUNITY): Payer: Medicare Other

## 2013-01-08 ENCOUNTER — Encounter: Payer: Self-pay | Admitting: Pulmonary Disease

## 2013-01-08 ENCOUNTER — Ambulatory Visit (INDEPENDENT_AMBULATORY_CARE_PROVIDER_SITE_OTHER): Payer: Medicare Other | Admitting: Pulmonary Disease

## 2013-01-08 VITALS — BP 118/72 | HR 78 | Temp 98.1°F | Ht 63.5 in | Wt 156.4 lb

## 2013-01-08 DIAGNOSIS — J449 Chronic obstructive pulmonary disease, unspecified: Secondary | ICD-10-CM

## 2013-01-08 NOTE — Patient Instructions (Addendum)
No change in medications, but will give you the patient assistance forms for spiriva.  Work on conditioning and weight loss followup with me in 6mos if doing well.

## 2013-01-08 NOTE — Progress Notes (Signed)
  Subjective:    Patient ID: Yolanda Morris, female    DOB: September 10, 1946, 67 y.o.   MRN: [id number]  HPI Patient comes in today for followup of her known COPD.  She is staying on Advair, and has completed her pulmonary rehabilitation.  She feels this has helped her greatly, and plans on continuing her program at a [employer] facility.  She had a mild flare in December of last year, but not since that time.  She rarely uses her rescue inhaler.   Review of Systems  Constitutional: Negative for fever and unexpected weight change.  HENT: Negative for ear pain, nosebleeds, congestion, sore throat, rhinorrhea, sneezing, trouble swallowing, dental problem, postnasal drip and sinus pressure.   Eyes: Negative for redness and itching.  Respiratory: Positive for cough. Negative for chest tightness, shortness of breath and wheezing.   Cardiovascular: Negative for palpitations and leg swelling.  Gastrointestinal: Negative for nausea and vomiting.  Genitourinary: Negative for dysuria.  Musculoskeletal: Negative for joint swelling.  Skin: Negative for rash.  Neurological: Positive for headaches.  Hematological: Does not bruise/bleed easily.  Psychiatric/Behavioral: Negative for dysphoric mood. The patient is not nervous/anxious.        Objective:   Physical Exam Overweight female in no acute distress Nose with purulent discharge noted Neck without lymphadenopathy or thyromegaly Chest with mild decrease in breath sounds, no wheezes or crackles Cardiac exam with regular rate and rhythm Lower extremities without edema, no cyanosis Alert and oriented, moves all 4 extremities.       Assessment & Plan:

## 2013-01-08 NOTE — Assessment & Plan Note (Signed)
The patient is doing fairly well from a COPD standpoint.  She feels that her breathing and functional status are much improved since completing pulmonary rehabilitation.  She has not had a recent acute exacerbation, and rarely uses her rescue inhaler.  I have stressed her the importance of continuing her conditioning program, as well as working on weight loss.  She did have a significant response to the addition of Spiriva, but was unable to afford the medication.  We'll give her the paperwork for the patient assistance program.

## 2013-01-09 DIAGNOSIS — I10 Essential (primary) hypertension: Secondary | ICD-10-CM | POA: Diagnosis not present

## 2013-01-09 DIAGNOSIS — E119 Type 2 diabetes mellitus without complications: Secondary | ICD-10-CM | POA: Diagnosis not present

## 2013-01-09 DIAGNOSIS — E785 Hyperlipidemia, unspecified: Secondary | ICD-10-CM | POA: Diagnosis not present

## 2013-02-01 DIAGNOSIS — J209 Acute bronchitis, unspecified: Secondary | ICD-10-CM | POA: Diagnosis not present

## 2013-02-01 DIAGNOSIS — R0989 Other specified symptoms and signs involving the circulatory and respiratory systems: Secondary | ICD-10-CM | POA: Diagnosis not present

## 2013-02-01 DIAGNOSIS — R05 Cough: Secondary | ICD-10-CM | POA: Diagnosis not present

## 2013-02-12 ENCOUNTER — Ambulatory Visit (INDEPENDENT_AMBULATORY_CARE_PROVIDER_SITE_OTHER): Payer: Medicare Other | Admitting: Pulmonary Disease

## 2013-02-12 ENCOUNTER — Encounter: Payer: Self-pay | Admitting: Pulmonary Disease

## 2013-02-12 VITALS — BP 138/80 | HR 83 | Temp 97.0°F | Ht 63.5 in | Wt 155.0 lb

## 2013-02-12 DIAGNOSIS — J019 Acute sinusitis, unspecified: Secondary | ICD-10-CM

## 2013-02-12 DIAGNOSIS — J209 Acute bronchitis, unspecified: Secondary | ICD-10-CM | POA: Diagnosis not present

## 2013-02-12 DIAGNOSIS — J449 Chronic obstructive pulmonary disease, unspecified: Secondary | ICD-10-CM

## 2013-02-12 MED ORDER — PREDNISONE 10 MG PO TABS: ORAL_TABLET | ORAL | Status: DC

## 2013-02-12 MED ORDER — AMOXICILLIN-POT CLAVULANATE 875-125 MG PO TABS: 1.0000 | ORAL_TABLET | Freq: Two times a day (BID) | ORAL | Status: DC

## 2013-02-12 MED ORDER — NYSTATIN 100000 UNIT/ML MT SUSP: OROMUCOSAL | Status: DC

## 2013-02-12 NOTE — Assessment & Plan Note (Signed)
The patient's history is most consistent with an acute sinobronchitis.  She was initially treated for her sinus infection, and now it sounds more of a chest issue.  I think she will need an extended treatment of antibiotics, as well as a course of prednisone.  She is at increased risk for COPD exacerbation given her underlying lung disease.  We'll also get her to work more aggressively on nasal hygiene.

## 2013-02-12 NOTE — Progress Notes (Signed)
  Subjective:    Patient ID: Yolanda Morris, female    DOB: 01-12-1946, 67 y.o.   MRN: [id number]  HPI Patient comes in today for an acute sick visit.  She has a history of COPD, and gives a few week history that started out as sinus congestion with purulence and postnasal drip.  She was seen by an urgent care while out-of-town, and treated with an antibiotic for a week.  She did have improvement in her sinus symptoms, but then she began to develop chest congestion, as well as a cough with purulent mucus.  She has had some increasing shortness of breath.  Her sinus drainage and congestion has not totally resolved as well.  She denies any fevers or chills currently.   Review of Systems  Constitutional: Negative for fever and unexpected weight change.  HENT: Positive for congestion, sore throat, rhinorrhea, sneezing, mouth sores ( x 2-3 days---in mouth and throat), postnasal drip and sinus pressure. Negative for ear pain, nosebleeds, trouble swallowing and dental problem.   Eyes: Negative for redness and itching.  Respiratory: Positive for cough, chest tightness and shortness of breath. Negative for wheezing.   Cardiovascular: Negative for palpitations and leg swelling.  Gastrointestinal: Negative for nausea and vomiting.  Genitourinary: Negative for dysuria.  Musculoskeletal: Negative for joint swelling.  Skin: Negative for rash.  Neurological: Negative for headaches.  Hematological: Does not bruise/bleed easily.  Psychiatric/Behavioral: Negative for dysphoric mood. The patient is not nervous/anxious.        Objective:   Physical Exam Well-developed female in no acute distress Nose with erythematous changes, but no purulence Oropharynx without exudates, but mild thrush noted on tongue  Neck without lymphadenopathy or thyromegaly Chest with decreased breath sounds, no wheezing Cardiac exam is regular rate and rhythm Lower extremities without edema, no cyanosis  alert and oriented, moves  all 4 extremities.       Assessment & Plan:

## 2013-02-12 NOTE — Addendum Note (Signed)
Addended by: Nita Sells on: 02/12/2013 12:22 PM   Modules accepted: Orders

## 2013-02-12 NOTE — Patient Instructions (Addendum)
Will treat with augmentin 875mg  one in am and pm for next 7 days.  Take on full stomach with large glass of water. Use neilmed sinus rinses to clean out sinuses/congestion. Will treat with a short course of prednisone to decrease inflammation and get you better faster. Nystatin mouthwash for your mouth ulcers as directed.  Let us know if you are not improving.

## 2013-03-27 ENCOUNTER — Telehealth: Payer: Self-pay | Admitting: Pulmonary Disease

## 2013-03-27 MED ORDER — CEFDINIR 300 MG PO CAPS: ORAL_CAPSULE | ORAL | Status: DC

## 2013-03-27 NOTE — Telephone Encounter (Signed)
Pt aware of recs. rx called in 

## 2013-03-27 NOTE — Telephone Encounter (Signed)
Can do omnicef 30mg  take 2 each am for 5 days.

## 2013-03-27 NOTE — Telephone Encounter (Signed)
Called spoke with patient who reports her cough never resolved from last ov on 4.29.14 but has now worsened x2 weeks w/ yellow mucus production, wheezing, some increased DOE, chest tightness and fatigue.  Pt denies any f/c/s.  Pt stated that while her cough has worsened, she does not feel ill enough to where she is not as active (still working out on the treadmill, running errands, yard work, Catering manager.).   Last ov w/ KC 4.29.14: Patient Instructions    Will treat with augmentin 875mg  one in am and pm for next 7 days. Take on full stomach with large glass of water.  Use neilmed sinus rinses to clean out sinuses/congestion.  Will treat with a short course of prednisone to decrease inflammation and get you better faster.  Nystatin mouthwash for your mouth ulcers as directed.  Let us know if you are not improving.    Dr Shelle Iron please advise, thank you [facility] Allergies  Allergen Reactions  . Bacitracin     rash  . Levofloxacin     hives

## 2013-03-27 NOTE — Telephone Encounter (Signed)
Ok to treat her with levaquin 750mg  one a day for 5 days for possible bronchitis. She can take mucinex, and needs to continue sinus rinses if she is having nasal symptoms. If this does not resolve, or if it comes back quickly, will do scan of sinuses.

## 2013-03-27 NOTE — Telephone Encounter (Signed)
Dr Shelle Iron, pt has documented allergy to levaquin. Please advise, thank you.

## 2013-04-08 ENCOUNTER — Telehealth: Payer: Self-pay | Admitting: Pulmonary Disease

## 2013-04-08 MED ORDER — FLUTICASONE-SALMETEROL 250-50 MCG/DOSE IN AEPB: 1.0000 | INHALATION_SPRAY | Freq: Two times a day (BID) | RESPIRATORY_TRACT | Status: DC

## 2013-04-08 NOTE — Telephone Encounter (Signed)
Spoke with pt requesting rx for advair rx sent nothing further needed.

## 2013-05-22 ENCOUNTER — Other Ambulatory Visit: Payer: Self-pay

## 2013-07-05 ENCOUNTER — Ambulatory Visit (INDEPENDENT_AMBULATORY_CARE_PROVIDER_SITE_OTHER): Payer: Medicare Other | Admitting: Pulmonary Disease

## 2013-07-05 ENCOUNTER — Encounter: Payer: Self-pay | Admitting: Pulmonary Disease

## 2013-07-05 ENCOUNTER — Ambulatory Visit (INDEPENDENT_AMBULATORY_CARE_PROVIDER_SITE_OTHER)
Admission: RE | Admit: 2013-07-05 | Discharge: 2013-07-05 | Disposition: A | Discharge status: Home / Self Care | Payer: Medicare Other | Source: Ambulatory Visit | Attending: Pulmonary Disease | Admitting: Pulmonary Disease

## 2013-07-05 VITALS — BP 122/76 | HR 84 | Temp 97.3°F | Ht 63.5 in | Wt 156.6 lb

## 2013-07-05 DIAGNOSIS — J449 Chronic obstructive pulmonary disease, unspecified: Secondary | ICD-10-CM

## 2013-07-05 DIAGNOSIS — R05 Cough: Secondary | ICD-10-CM

## 2013-07-05 DIAGNOSIS — J40 Bronchitis, not specified as acute or chronic: Secondary | ICD-10-CM | POA: Diagnosis not present

## 2013-07-05 NOTE — Assessment & Plan Note (Signed)
The patient is stable from a COPD standpoint.

## 2013-07-05 NOTE — Assessment & Plan Note (Signed)
The patient has a persistent dry cough that is occasionally productive of discolored mucus.  The differential for this includes chronic sinusitis, postnasal drip, reflux disease, and cyclical coughing.  I do not think it is related to something in the chest, but will check a chest x-ray today for completeness.  If this is unremarkable, she will need a limited scan of her sinuses for completeness.  I would also like to work on treatment for various causes of upper airway cough, including postnasal drip and reflux.  Finally, I am concerned her dry powder Advair may be contributing to her upper airway dysfunction and cough.  We'll try her on an MDI with a spacer.

## 2013-07-05 NOTE — Progress Notes (Signed)
  Subjective:    Patient ID: Yolanda Morris, female    DOB: [birth year], 67 y.o.   MRN: [id number]  HPI Patient comes in today for acute sick visit.  She has known COPD that has been well controlled with her Advair inhaler, but over the last 3-4 months has had a persistent cough occasionally productive of purulent mucus.  It is primarily dry in nature.  The patient has had an acute sinusitis in the interim, and treated with antibiotics.  She continues to have some sinus symptoms.  She does have residual postnasal drip, and most recently has been having some reflux symptoms at night.  She clearly has a cyclical cough mechanism as well.  She's tells me that her shortness of breath is well controlled.   Review of Systems  Constitutional: Negative for fever and unexpected weight change.  HENT: Positive for congestion ( chest congestion--yellow mucus), sore throat and voice change. Negative for ear pain, nosebleeds, rhinorrhea, sneezing, trouble swallowing, dental problem, postnasal drip and sinus pressure.   Eyes: Negative for redness and itching.  Respiratory: Positive for cough and shortness of breath. Negative for chest tightness and wheezing.   Cardiovascular: Positive for chest pain. Negative for palpitations and leg swelling.  Gastrointestinal: Positive for nausea. Negative for vomiting.  Genitourinary: Negative for dysuria.  Musculoskeletal: Negative for joint swelling.  Skin: Negative for rash.  Neurological: Negative for headaches.  Hematological: Does not bruise/bleed easily.  Psychiatric/Behavioral: Negative for dysphoric mood. The patient is not nervous/anxious.        Objective:   Physical Exam Overweight female in no acute distress Nose without purulence, but mild erythema and edema noted Oropharynx clear Neck without lymphadenopathy or thyromegaly Chest totally clear to auscultation, no wheezes Cardiac exam regular rate and rhythm Lower extremities without significant edema,  no cyanosis Alert and oriented, moves 4 extremities.       Assessment & Plan:

## 2013-07-05 NOTE — Patient Instructions (Addendum)
Will check a chest xray today.  If normal, will image sinuses to make sure no chronic sinusitis. Hold zyrtec, and take chlorpheniramine 4mg  otc, take 2 at bedtime and one at lunch each day for next 2 weeks. Take dexilant 60mg  one each am for next 2 weeks. Stop advair for now, and will change to symbicort 160/4.5  2 inhalations each am and pm with spacer.  Rinse mouth well and gargle after using. Use hard candy to prevent throat clearing and cough. Please call me in 2 weeks to give update.

## 2013-07-08 ENCOUNTER — Other Ambulatory Visit: Payer: Self-pay | Admitting: Pulmonary Disease

## 2013-07-08 DIAGNOSIS — R05 Cough: Secondary | ICD-10-CM

## 2013-07-10 ENCOUNTER — Ambulatory Visit (INDEPENDENT_AMBULATORY_CARE_PROVIDER_SITE_OTHER)
Admission: RE | Admit: 2013-07-10 | Discharge: 2013-07-10 | Disposition: A | Discharge status: Home / Self Care | Payer: Medicare Other | Source: Ambulatory Visit | Attending: Pulmonary Disease | Admitting: Pulmonary Disease

## 2013-07-10 DIAGNOSIS — R05 Cough: Secondary | ICD-10-CM

## 2013-07-11 ENCOUNTER — Ambulatory Visit: Payer: Medicare Other | Admitting: Pulmonary Disease

## 2013-07-12 ENCOUNTER — Telehealth: Payer: Self-pay | Admitting: Pulmonary Disease

## 2013-07-12 NOTE — Telephone Encounter (Signed)
Result Notes    Notes Recorded by Barbaraann Share, MD on 07/10/2013 at 5:59 PM Please let pt know that scan showed no sinusitis or sinus disease. Stick with prior plan as outlined.   I spoke with patient about results and she verbalized understanding and had no questions

## 2013-07-17 ENCOUNTER — Telehealth: Payer: Self-pay | Admitting: Pulmonary Disease

## 2013-07-17 NOTE — Telephone Encounter (Signed)
If her cough is better, ok to stop PPI.  Would continue antihistamine as needed, and avoid throat clearing.  Stay on symbicort.  Would like to see her back in 4 weeks to check on things.

## 2013-07-17 NOTE — Telephone Encounter (Signed)
     Patient Instructions    Will check a chest xray today.  If normal, will image sinuses to make sure no chronic sinusitis. Hold zyrtec, and take chlorpheniramine 4mg  otc, take 2 at bedtime and one at lunch each day for next 2 weeks. Take dexilant 60mg  one each am for next 2 weeks. Stop advair for now, and will change to symbicort 160/4.5  2 inhalations each am and pm with spacer.  Rinse mouth well and gargle after using. Use hard candy to prevent throat clearing and cough. Please call me in 2 weeks to give update  ----  I spoke with pt. She calling to give update. She has been doing everything KC has recommended. She tried dexilant 60 mg and will soon be out after thrusday. She is wanting to know if she can stop this medication and see how well she does.Pt reports she prefers not to be on acid reflux medication if she does not have to.  She reports she still has mostly dry cough w/ occasional yellow phlem. She stated the cough is about 80% better. The symbicort seems to be doing well with her. She is rinsing out after each use. Using hard candy to bathe back of throat. She is staying away from peppermint. She is taking chlorpheniramine 1 at lunch and 2 at bedtime. Please advise regarding dexilant KC thanks

## 2013-07-18 NOTE — Telephone Encounter (Signed)
Pt is aware and appt scheduled. Nothing further needed

## 2013-07-30 DIAGNOSIS — Z23 Encounter for immunization: Secondary | ICD-10-CM | POA: Diagnosis not present

## 2013-08-05 ENCOUNTER — Telehealth: Payer: Self-pay | Admitting: Pulmonary Disease

## 2013-08-05 MED ORDER — BUDESONIDE-FORMOTEROL FUMARATE 160-4.5 MCG/ACT IN AERO: 2.0000 | INHALATION_SPRAY | Freq: Two times a day (BID) | RESPIRATORY_TRACT | Status: DC

## 2013-08-05 NOTE — Telephone Encounter (Signed)
Advised pt that we did not have samples of Symbicort at this time. Requested for a refill be sent to her pharmacy. Nothing further was needed at this time.

## 2013-08-22 ENCOUNTER — Other Ambulatory Visit: Payer: Self-pay

## 2013-08-22 DIAGNOSIS — Z1231 Encounter for screening mammogram for malignant neoplasm of breast: Secondary | ICD-10-CM

## 2013-08-23 ENCOUNTER — Encounter: Payer: Self-pay | Admitting: Pulmonary Disease

## 2013-08-23 ENCOUNTER — Ambulatory Visit (INDEPENDENT_AMBULATORY_CARE_PROVIDER_SITE_OTHER): Payer: Medicare Other | Admitting: Pulmonary Disease

## 2013-08-23 VITALS — BP 150/80 | HR 94 | Temp 98.0°F | Ht 63.0 in | Wt 157.6 lb

## 2013-08-23 DIAGNOSIS — R05 Cough: Secondary | ICD-10-CM | POA: Diagnosis not present

## 2013-08-23 DIAGNOSIS — J449 Chronic obstructive pulmonary disease, unspecified: Secondary | ICD-10-CM

## 2013-08-23 NOTE — Assessment & Plan Note (Signed)
The patient's cough is completely resolved, and I suspect it was secondary to her Advair.  We also treated her for reflux and postnasal drip for a short period of time, and the patient tells me that she also discontinued her osteoporosis medication.  Will leave her on symbicort with the spacer and see how she does.  She needs to talk with her primary physician about osteoporosis prevention.

## 2013-08-23 NOTE — Assessment & Plan Note (Signed)
The patient is doing well from a COPD standpoint on her current regimen.  I have stressed to her the importance of some type of exercise program and aggressive weight loss.

## 2013-08-23 NOTE — Progress Notes (Signed)
  Subjective:    Patient ID: Yolanda Morris, female    DOB: 05-06-1946, 67 y.o.   MRN: [id number]  HPI The patient comes in today for followup of her known COPD.  She had an increased cough for the last visit, and this was felt to be secondary to upper airway issues.  Her Advair was changed to symbicort in order to minimize irritation to the upper airway, and she was also treated for a few weeks with an antihistamine and proton pump inhibitor.  She tells me that her cough totally resolved, and that she is much improved.  Her breathing has been stable from the last visit.   Review of Systems  Constitutional: Negative for fever and unexpected weight change.  HENT: Negative for congestion, dental problem, ear pain, nosebleeds, postnasal drip, rhinorrhea, sinus pressure, sneezing, sore throat and trouble swallowing.   Eyes: Negative for redness and itching.  Respiratory: Negative for cough, chest tightness, shortness of breath and wheezing.   Cardiovascular: Negative for palpitations and leg swelling.  Gastrointestinal: Negative for nausea and vomiting.  Genitourinary: Negative for dysuria.  Musculoskeletal: Negative for joint swelling.  Skin: Negative for rash.  Neurological: Negative for headaches.  Hematological: Does not bruise/bleed easily.  Psychiatric/Behavioral: Negative for dysphoric mood. The patient is not nervous/anxious.        Objective:   Physical Exam Overweight female in no acute distress Nose without purulence or discharge noted Neck without lymphadenopathy or thyromegaly Chest with decreased breath sounds, no active wheezing or crackles Cardiac exam with regular rate and rhythm Lower extremities with minimal edema, no cyanosis Alert and oriented, moves all 4 extremities.       Assessment & Plan:

## 2013-08-23 NOTE — Patient Instructions (Signed)
Continue on symbicort with spacer.  Keep mouth rinsed well. Change your antihistamine to as needed. If increased reflux symptoms, get back on some type of medication. Work on weight reduction, as well as some type of exercise program. Discuss with your primary osteoporosis prevention (you have compressed T7 on xray). followup with me in 6mos if doing well.

## 2013-09-13 ENCOUNTER — Other Ambulatory Visit: Payer: Self-pay | Admitting: Pulmonary Disease

## 2013-09-16 DIAGNOSIS — J449 Chronic obstructive pulmonary disease, unspecified: Secondary | ICD-10-CM | POA: Diagnosis not present

## 2013-09-16 DIAGNOSIS — M810 Age-related osteoporosis without current pathological fracture: Secondary | ICD-10-CM | POA: Diagnosis not present

## 2013-09-16 DIAGNOSIS — Z1331 Encounter for screening for depression: Secondary | ICD-10-CM | POA: Diagnosis not present

## 2013-09-16 DIAGNOSIS — E119 Type 2 diabetes mellitus without complications: Secondary | ICD-10-CM | POA: Diagnosis not present

## 2013-09-16 DIAGNOSIS — I10 Essential (primary) hypertension: Secondary | ICD-10-CM | POA: Diagnosis not present

## 2013-09-16 DIAGNOSIS — E785 Hyperlipidemia, unspecified: Secondary | ICD-10-CM | POA: Diagnosis not present

## 2013-09-19 DIAGNOSIS — M810 Age-related osteoporosis without current pathological fracture: Secondary | ICD-10-CM | POA: Diagnosis not present

## 2013-09-24 ENCOUNTER — Ambulatory Visit: Payer: Medicare Other

## 2013-10-31 ENCOUNTER — Telehealth: Payer: Self-pay | Admitting: Pulmonary Disease

## 2013-10-31 MED ORDER — BUDESONIDE-FORMOTEROL FUMARATE 160-4.5 MCG/ACT IN AERO: INHALATION_SPRAY | RESPIRATORY_TRACT | Status: DC

## 2013-10-31 NOTE — Telephone Encounter (Signed)
Spoke with pt. Aware RX will be sent. Nothing further needed

## 2013-11-12 ENCOUNTER — Ambulatory Visit
Admission: RE | Admit: 2013-11-12 | Discharge: 2013-11-12 | Disposition: A | Discharge status: Home / Self Care | Payer: Medicare Other | Source: Ambulatory Visit

## 2013-11-12 DIAGNOSIS — Z1231 Encounter for screening mammogram for malignant neoplasm of breast: Secondary | ICD-10-CM

## 2013-11-21 DIAGNOSIS — L259 Unspecified contact dermatitis, unspecified cause: Secondary | ICD-10-CM | POA: Diagnosis not present

## 2013-11-21 DIAGNOSIS — L819 Disorder of pigmentation, unspecified: Secondary | ICD-10-CM | POA: Diagnosis not present

## 2013-11-21 DIAGNOSIS — L919 Hypertrophic disorder of the skin, unspecified: Secondary | ICD-10-CM | POA: Diagnosis not present

## 2013-11-21 DIAGNOSIS — D1801 Hemangioma of skin and subcutaneous tissue: Secondary | ICD-10-CM | POA: Diagnosis not present

## 2013-11-21 DIAGNOSIS — L909 Atrophic disorder of skin, unspecified: Secondary | ICD-10-CM | POA: Diagnosis not present

## 2013-11-21 DIAGNOSIS — D239 Other benign neoplasm of skin, unspecified: Secondary | ICD-10-CM | POA: Diagnosis not present

## 2013-11-21 DIAGNOSIS — L821 Other seborrheic keratosis: Secondary | ICD-10-CM | POA: Diagnosis not present

## 2013-11-21 DIAGNOSIS — Z85828 Personal history of other malignant neoplasm of skin: Secondary | ICD-10-CM | POA: Diagnosis not present

## 2013-11-26 DIAGNOSIS — M62838 Other muscle spasm: Secondary | ICD-10-CM | POA: Diagnosis not present

## 2013-11-26 DIAGNOSIS — IMO0002 Reserved for concepts with insufficient information to code with codable children: Secondary | ICD-10-CM | POA: Diagnosis not present

## 2013-12-02 ENCOUNTER — Ambulatory Visit
Admission: RE | Admit: 2013-12-02 | Discharge: 2013-12-02 | Disposition: A | Discharge status: Home / Self Care | Payer: Medicare Other | Source: Ambulatory Visit | Attending: Family Medicine | Admitting: Family Medicine

## 2013-12-02 ENCOUNTER — Other Ambulatory Visit: Payer: Self-pay | Admitting: Family Medicine

## 2013-12-02 DIAGNOSIS — M545 Low back pain, unspecified: Secondary | ICD-10-CM

## 2013-12-02 DIAGNOSIS — M4120 Other idiopathic scoliosis, site unspecified: Secondary | ICD-10-CM | POA: Diagnosis not present

## 2013-12-06 DIAGNOSIS — M546 Pain in thoracic spine: Secondary | ICD-10-CM | POA: Diagnosis not present

## 2013-12-11 DIAGNOSIS — M546 Pain in thoracic spine: Secondary | ICD-10-CM | POA: Diagnosis not present

## 2013-12-16 DIAGNOSIS — M546 Pain in thoracic spine: Secondary | ICD-10-CM | POA: Diagnosis not present

## 2013-12-18 DIAGNOSIS — E119 Type 2 diabetes mellitus without complications: Secondary | ICD-10-CM | POA: Diagnosis not present

## 2013-12-18 DIAGNOSIS — B009 Herpesviral infection, unspecified: Secondary | ICD-10-CM | POA: Diagnosis not present

## 2013-12-18 DIAGNOSIS — J449 Chronic obstructive pulmonary disease, unspecified: Secondary | ICD-10-CM | POA: Diagnosis not present

## 2013-12-18 DIAGNOSIS — M810 Age-related osteoporosis without current pathological fracture: Secondary | ICD-10-CM | POA: Diagnosis not present

## 2013-12-18 DIAGNOSIS — Z0000 Encounter for general adult medical examination without abnormal findings: Secondary | ICD-10-CM | POA: Diagnosis not present

## 2013-12-18 DIAGNOSIS — I10 Essential (primary) hypertension: Secondary | ICD-10-CM | POA: Diagnosis not present

## 2013-12-18 DIAGNOSIS — Z23 Encounter for immunization: Secondary | ICD-10-CM | POA: Diagnosis not present

## 2013-12-19 DIAGNOSIS — M546 Pain in thoracic spine: Secondary | ICD-10-CM | POA: Diagnosis not present

## 2013-12-23 DIAGNOSIS — M546 Pain in thoracic spine: Secondary | ICD-10-CM | POA: Diagnosis not present

## 2013-12-25 DIAGNOSIS — H33329 Round hole, unspecified eye: Secondary | ICD-10-CM | POA: Diagnosis not present

## 2013-12-25 DIAGNOSIS — E119 Type 2 diabetes mellitus without complications: Secondary | ICD-10-CM | POA: Diagnosis not present

## 2013-12-25 DIAGNOSIS — H2510 Age-related nuclear cataract, unspecified eye: Secondary | ICD-10-CM | POA: Diagnosis not present

## 2013-12-26 DIAGNOSIS — M546 Pain in thoracic spine: Secondary | ICD-10-CM | POA: Diagnosis not present

## 2013-12-30 DIAGNOSIS — M546 Pain in thoracic spine: Secondary | ICD-10-CM | POA: Diagnosis not present

## 2013-12-30 DIAGNOSIS — M810 Age-related osteoporosis without current pathological fracture: Secondary | ICD-10-CM | POA: Diagnosis not present

## 2013-12-30 DIAGNOSIS — Z8781 Personal history of (healed) traumatic fracture: Secondary | ICD-10-CM | POA: Diagnosis not present

## 2014-01-01 DIAGNOSIS — M810 Age-related osteoporosis without current pathological fracture: Secondary | ICD-10-CM | POA: Diagnosis not present

## 2014-01-01 DIAGNOSIS — Z8781 Personal history of (healed) traumatic fracture: Secondary | ICD-10-CM | POA: Diagnosis not present

## 2014-01-02 DIAGNOSIS — M546 Pain in thoracic spine: Secondary | ICD-10-CM | POA: Diagnosis not present

## 2014-01-02 DIAGNOSIS — Z8781 Personal history of (healed) traumatic fracture: Secondary | ICD-10-CM | POA: Diagnosis not present

## 2014-01-02 DIAGNOSIS — M810 Age-related osteoporosis without current pathological fracture: Secondary | ICD-10-CM | POA: Diagnosis not present

## 2014-01-10 DIAGNOSIS — M546 Pain in thoracic spine: Secondary | ICD-10-CM | POA: Diagnosis not present

## 2014-01-16 DIAGNOSIS — M546 Pain in thoracic spine: Secondary | ICD-10-CM | POA: Diagnosis not present

## 2014-01-22 DIAGNOSIS — M546 Pain in thoracic spine: Secondary | ICD-10-CM | POA: Diagnosis not present

## 2014-01-30 DIAGNOSIS — M546 Pain in thoracic spine: Secondary | ICD-10-CM | POA: Diagnosis not present

## 2014-02-05 DIAGNOSIS — Z8781 Personal history of (healed) traumatic fracture: Secondary | ICD-10-CM | POA: Diagnosis not present

## 2014-02-05 DIAGNOSIS — M810 Age-related osteoporosis without current pathological fracture: Secondary | ICD-10-CM | POA: Diagnosis not present

## 2014-02-11 DIAGNOSIS — M810 Age-related osteoporosis without current pathological fracture: Secondary | ICD-10-CM | POA: Diagnosis not present

## 2014-02-11 DIAGNOSIS — Z79899 Other long term (current) drug therapy: Secondary | ICD-10-CM | POA: Diagnosis not present

## 2014-02-20 ENCOUNTER — Ambulatory Visit (INDEPENDENT_AMBULATORY_CARE_PROVIDER_SITE_OTHER): Payer: Medicare Other | Admitting: Pulmonary Disease

## 2014-02-20 ENCOUNTER — Encounter: Payer: Self-pay | Admitting: Pulmonary Disease

## 2014-02-20 VITALS — BP 140/96 | HR 73 | Temp 98.0°F | Ht 62.4 in | Wt 157.0 lb

## 2014-02-20 DIAGNOSIS — J4489 Other specified chronic obstructive pulmonary disease: Secondary | ICD-10-CM

## 2014-02-20 DIAGNOSIS — J449 Chronic obstructive pulmonary disease, unspecified: Secondary | ICD-10-CM

## 2014-02-20 NOTE — Progress Notes (Signed)
   Subjective:    Patient ID: Yolanda Morris, female    DOB: 1946-04-09, 68 y.o.   MRN: [id number]  HPI Patient comes in today for followup of her known COPD. She is maintaining on Symbicort with a spacer and has done well with this. She has not had an acute exacerbation or infection since last visit, although she feels that her dyspnea on exertion is a little more than baseline. She does blames this on her increased allergy symptoms, but is not taking an antihistamine on a consistent basis.   Review of Systems  Constitutional: Negative for fever and unexpected weight change.  HENT: Negative for congestion, dental problem, ear pain, nosebleeds, postnasal drip, rhinorrhea, sinus pressure, sneezing, sore throat and trouble swallowing.   Eyes: Negative for redness and itching.  Respiratory: Positive for shortness of breath. Negative for cough, chest tightness and wheezing.   Cardiovascular: Negative for palpitations and leg swelling.  Gastrointestinal: Negative for nausea and vomiting.  Genitourinary: Negative for dysuria.  Musculoskeletal: Negative for joint swelling.  Skin: Negative for rash.  Neurological: Negative for headaches.  Hematological: Does not bruise/bleed easily.  Psychiatric/Behavioral: Negative for dysphoric mood. The patient is not nervous/anxious.        Objective:   Physical Exam Overweight female in no acute distress Nose without purulence or discharge noted Neck without lymphadenopathy or thyromegaly Chest with mild decrease in breath sounds, adequate airflow, no active wheezing. Cardiac exam with regular rate and rhythm Lower extremities without significant edema, no cyanosis Alert and oriented, moves all 4 extremities.       Assessment & Plan:

## 2014-02-20 NOTE — Patient Instructions (Signed)
Continue on Symbicort on a daily basis, but call if he feel your breathing is not adequate. Trial of Zyrtec 10 mg at bedtime for the next few weeks to see if it helps with your allergy symptoms Try and stay as active as possible, and work on weight loss. Followup with me in 6 months.

## 2014-02-20 NOTE — Assessment & Plan Note (Signed)
The patient overall is doing very well from a COPD standpoint. She has not had an acute exacerbation or pulmonary infection, although she feels that her dyspnea on exertion is a little more than usual because of allergies. I have discussed with her the various medications for treatment of COPD, and how we typically do a staircase method for an escalation of therapy as needed. For now, I would like for her to try Zyrtec at bedtime, and see how she does. I also stressed to her the importance of weight loss and conditioning

## 2014-03-12 ENCOUNTER — Ambulatory Visit (HOSPITAL_COMMUNITY)
Admission: RE | Admit: 2014-03-12 | Discharge: 2014-03-12 | Disposition: A | Discharge status: Home / Self Care | Payer: Medicare Other | Source: Ambulatory Visit | Attending: Internal Medicine | Admitting: Internal Medicine

## 2014-03-12 ENCOUNTER — Encounter (HOSPITAL_COMMUNITY): Payer: Self-pay

## 2014-03-12 ENCOUNTER — Other Ambulatory Visit (HOSPITAL_COMMUNITY): Payer: Self-pay | Admitting: Internal Medicine

## 2014-03-12 DIAGNOSIS — M810 Age-related osteoporosis without current pathological fracture: Secondary | ICD-10-CM | POA: Insufficient documentation

## 2014-03-12 MED ORDER — SODIUM CHLORIDE 0.9 % IV SOLN
Freq: Once | INTRAVENOUS | Status: AC
  Administered 2014-03-12: 14:00:00 via INTRAVENOUS

## 2014-03-12 MED ORDER — ZOLEDRONIC ACID 5 MG/100ML IV SOLN
5.0000 mg | Freq: Once | INTRAVENOUS | Status: DC
  Filled 2014-03-12: qty 100

## 2014-03-12 NOTE — Discharge Instructions (Signed)
RECLAST °Zoledronic Acid injection (Paget's Disease, Osteoporosis) °What is this medicine? °ZOLEDRONIC ACID (ZOE le dron ik AS id) lowers the amount of calcium loss from bone. It is used to treat Paget's disease and osteoporosis in women. °This medicine may be used for other purposes; ask your health care provider or pharmacist if you have questions. °COMMON BRAND NAME(S): Reclast, Zometa °What should I tell my health care provider before I take this medicine? °They need to know if you have any of these conditions: °-aspirin-sensitive asthma °-cancer, especially if you are receiving medicines used to treat cancer °-dental disease or wear dentures °-infection °-kidney disease °-low levels of calcium in the blood °-past surgery on the parathyroid gland or intestines °-receiving corticosteroids like dexamethasone or prednisone °-an unusual or allergic reaction to zoledronic acid, other medicines, foods, dyes, or preservatives °-pregnant or trying to get pregnant °-breast-feeding °How should I use this medicine? °This medicine is for infusion into a vein. It is given by a health care professional in a hospital or clinic setting. °Talk to your pediatrician regarding the use of this medicine in children. This medicine is not approved for use in children. °Overdosage: If you think you have taken too much of this medicine contact a poison control center or emergency room at once. °NOTE: This medicine is only for you. Do not share this medicine with others. °What if I miss a dose? °It is important not to miss your dose. Call your doctor or health care professional if you are unable to keep an appointment. °What may interact with this medicine? °-certain antibiotics given by injection °-NSAIDs, medicines for pain and inflammation, like ibuprofen or naproxen °-some diuretics like bumetanide, furosemide °-teriparatide °This list may not describe all possible interactions. Give your health care provider a list of all the  medicines, herbs, non-prescription drugs, or dietary supplements you use. Also tell them if you smoke, drink alcohol, or use illegal drugs. Some items may interact with your medicine. °What should I watch for while using this medicine? °Visit your doctor or health care professional for regular checkups. It may be some time before you see the benefit from this medicine. Do not stop taking your medicine unless your doctor tells you to. Your doctor may order blood tests or other tests to see how you are doing. °Women should inform their doctor if they wish to become pregnant or think they might be pregnant. There is a potential for serious side effects to an unborn child. Talk to your health care professional or pharmacist for more information. °You should make sure that you get enough calcium and vitamin D while you are taking this medicine. Discuss the foods you eat and the vitamins you take with your health care professional. °Some people who take this medicine have severe bone, joint, and/or muscle pain. This medicine may also increase your risk for jaw problems or a broken thigh bone. Tell your doctor right away if you have severe pain in your jaw, bones, joints, or muscles. Tell your doctor if you have any pain that does not go away or that gets worse. °Tell your dentist and dental surgeon that you are taking this medicine. You should not have major dental surgery while on this medicine. See your dentist to have a dental exam and fix any dental problems before starting this medicine. Take good care of your teeth while on this medicine. Make sure you see your dentist for regular follow-up appointments. °What side effects may I notice from receiving this   medicine? °Side effects that you should report to your doctor or health care professional as soon as possible: °-allergic reactions like skin rash, itching or hives, swelling of the face, lips, or tongue °-anxiety, confusion, or depression °-breathing  problems °-changes in vision °-eye pain °-feeling faint or lightheaded, falls °-jaw pain, especially after dental work °-mouth sores °-muscle cramps, stiffness, or weakness °-trouble passing urine or change in the amount of urine °Side effects that usually do not require medical attention (report to your doctor or health care professional if they continue or are bothersome): °-bone, joint, or muscle pain °-constipation °-diarrhea °-fever °-hair loss °-irritation at site where injected °-loss of appetite °-nausea, vomiting °-stomach upset °-trouble sleeping °-trouble swallowing °-weak or tired °This list may not describe all possible side effects. Call your doctor for medical advice about side effects. You may report side effects to FDA at 1-800-FDA-1088. °Where should I keep my medicine? °This drug is given in a hospital or clinic and will not be stored at home. °NOTE: This sheet is a summary. It may not cover all possible information. If you have questions about this medicine, talk to your doctor, pharmacist, or health care provider. °© 2014, [employer]. (2013-03-18 10:03:48) °Osteoporosis °Throughout your life, your body breaks down old bone and replaces it with new bone. As you get older, your body does not replace bone as quickly as it breaks it down. By the age of 30 years, most people begin to gradually lose bone because of the imbalance between bone loss and replacement. Some people lose more bone than others. Bone loss beyond a specified normal degree is considered osteoporosis.  °Osteoporosis affects the strength and durability of your bones. The inside of the ends of your bones and your flat bones, like the bones of your pelvis, look like honeycomb, filled with tiny open spaces. As bone loss occurs, your bones become less dense. This means that the open spaces inside your bones become bigger and the walls between these spaces become thinner. This makes your bones weaker. Bones of a person with  osteoporosis can become so weak that they can break (fracture) during minor accidents, such as a simple fall. °CAUSES  °The following factors have been associated with the development of osteoporosis: °· Smoking. °· Drinking more than 2 alcoholic drinks several days per week. °· Long-term use of certain medicines: °· Corticosteroids. °· Chemotherapy medicines. °· Thyroid medicines. °· Antiepileptic medicines. °· Gonadal hormone suppression medicine. °· Immunosuppression medicine. °· Being underweight. °· Lack of physical activity. °· Lack of exposure to the sun. This can lead to vitamin D deficiency. °· Certain medical conditions: °· Certain inflammatory bowel diseases, such as Crohn disease and ulcerative colitis. °· Diabetes. °· Hyperthyroidism. °· Hyperparathyroidism. °RISK FACTORS °Anyone can develop osteoporosis. However, the following factors can increase your risk of developing osteoporosis: °· Gender Women are at higher risk than men. °· Age Being older than 50 years increases your risk. °· Ethnicity White and Asian people have an increased risk. °· Weight Being extremely underweight can increase your risk of osteoporosis. °· Family history of osteoporosis Having a family member who has developed osteoporosis can increase your risk. °SYMPTOMS  °Usually, people with osteoporosis have no symptoms.  °DIAGNOSIS  °Signs during a physical exam that may prompt your caregiver to suspect osteoporosis include: °· Decreased height. This is usually caused by the compression of the bones that form your spine (vertebrae) because they have weakened and become fractured. °· A curving or rounding of   the upper back (kyphosis). To confirm signs of osteoporosis, your caregiver may request a procedure that uses 2 low-dose X-ray beams with different levels of energy to measure your bone mineral density (dual-energy X-ray absorptiometry [DXA]). Also, your caregiver may check your level of vitamin D. TREATMENT  The goal of  osteoporosis treatment is to strengthen bones in order to decrease the risk of bone fractures. There are different types of medicines available to help achieve this goal. Some of these medicines work by slowing the processes of bone loss. Some medicines work by increasing bone density. Treatment also involves making sure that your levels of calcium and vitamin D are adequate. PREVENTION  There are things you can do to help prevent osteoporosis. Adequate intake of calcium and vitamin D can help you achieve optimal bone mineral density. Regular exercise can also help, especially resistance and weight-bearing activities. If you smoke, quitting smoking is an important part of osteoporosis prevention. MAKE SURE YOU:  Understand these instructions.  Will watch your condition.  Will get help right away if you are not doing well or get worse. FOR MORE INFORMATION www.osteo.org and EquipmentWeekly.com.ee Document Released: 07/13/2005 Document Revised: 01/28/2013 Document Reviewed: 09/17/2011 [facility] Patient Information 2014 [place], [place].

## 2014-03-12 NOTE — Progress Notes (Signed)
Uneventful infusion of 1st RECLAST. Pt discharged accompanied by sister

## 2014-04-28 DIAGNOSIS — E119 Type 2 diabetes mellitus without complications: Secondary | ICD-10-CM | POA: Diagnosis not present

## 2014-04-28 DIAGNOSIS — I10 Essential (primary) hypertension: Secondary | ICD-10-CM | POA: Diagnosis not present

## 2014-04-28 DIAGNOSIS — E785 Hyperlipidemia, unspecified: Secondary | ICD-10-CM | POA: Diagnosis not present

## 2014-04-28 DIAGNOSIS — J449 Chronic obstructive pulmonary disease, unspecified: Secondary | ICD-10-CM | POA: Diagnosis not present

## 2014-04-28 DIAGNOSIS — M810 Age-related osteoporosis without current pathological fracture: Secondary | ICD-10-CM | POA: Diagnosis not present

## 2014-06-17 ENCOUNTER — Ambulatory Visit: Payer: Medicare Other | Attending: Internal Medicine | Admitting: Physical Therapy

## 2014-06-17 DIAGNOSIS — M8448XA Pathological fracture, other site, initial encounter for fracture: Secondary | ICD-10-CM | POA: Insufficient documentation

## 2014-06-17 DIAGNOSIS — E119 Type 2 diabetes mellitus without complications: Secondary | ICD-10-CM | POA: Diagnosis not present

## 2014-06-17 DIAGNOSIS — IMO0001 Reserved for inherently not codable concepts without codable children: Secondary | ICD-10-CM | POA: Diagnosis not present

## 2014-06-17 DIAGNOSIS — M810 Age-related osteoporosis without current pathological fracture: Secondary | ICD-10-CM | POA: Insufficient documentation

## 2014-06-17 DIAGNOSIS — J449 Chronic obstructive pulmonary disease, unspecified: Secondary | ICD-10-CM | POA: Diagnosis not present

## 2014-06-17 DIAGNOSIS — R5381 Other malaise: Secondary | ICD-10-CM | POA: Diagnosis not present

## 2014-06-17 DIAGNOSIS — J4489 Other specified chronic obstructive pulmonary disease: Secondary | ICD-10-CM | POA: Diagnosis not present

## 2014-06-17 DIAGNOSIS — I10 Essential (primary) hypertension: Secondary | ICD-10-CM | POA: Insufficient documentation

## 2014-06-17 DIAGNOSIS — M546 Pain in thoracic spine: Secondary | ICD-10-CM | POA: Insufficient documentation

## 2014-06-25 ENCOUNTER — Ambulatory Visit: Payer: Medicare Other | Admitting: Physical Therapy

## 2014-06-25 DIAGNOSIS — IMO0001 Reserved for inherently not codable concepts without codable children: Secondary | ICD-10-CM | POA: Diagnosis not present

## 2014-07-01 ENCOUNTER — Ambulatory Visit: Payer: Medicare Other | Admitting: Physical Therapy

## 2014-07-01 DIAGNOSIS — IMO0001 Reserved for inherently not codable concepts without codable children: Secondary | ICD-10-CM | POA: Diagnosis not present

## 2014-07-08 ENCOUNTER — Ambulatory Visit: Payer: Medicare Other | Admitting: Physical Therapy

## 2014-07-08 DIAGNOSIS — IMO0001 Reserved for inherently not codable concepts without codable children: Secondary | ICD-10-CM | POA: Diagnosis not present

## 2014-07-15 ENCOUNTER — Ambulatory Visit: Payer: Medicare Other | Admitting: Physical Therapy

## 2014-07-15 DIAGNOSIS — IMO0001 Reserved for inherently not codable concepts without codable children: Secondary | ICD-10-CM | POA: Diagnosis not present

## 2014-07-15 DIAGNOSIS — Z23 Encounter for immunization: Secondary | ICD-10-CM | POA: Diagnosis not present

## 2014-07-22 ENCOUNTER — Encounter: Payer: Medicare Other | Admitting: Physical Therapy

## 2014-07-23 ENCOUNTER — Encounter: Payer: Medicare Other | Admitting: Physical Therapy

## 2014-07-25 ENCOUNTER — Ambulatory Visit: Payer: Medicare Other | Attending: Internal Medicine | Admitting: Physical Therapy

## 2014-07-25 DIAGNOSIS — R5381 Other malaise: Secondary | ICD-10-CM | POA: Insufficient documentation

## 2014-07-25 DIAGNOSIS — M810 Age-related osteoporosis without current pathological fracture: Secondary | ICD-10-CM | POA: Insufficient documentation

## 2014-07-25 DIAGNOSIS — M546 Pain in thoracic spine: Secondary | ICD-10-CM | POA: Insufficient documentation

## 2014-07-25 DIAGNOSIS — I10 Essential (primary) hypertension: Secondary | ICD-10-CM | POA: Insufficient documentation

## 2014-07-25 DIAGNOSIS — M8088XA Other osteoporosis with current pathological fracture, vertebra(e), initial encounter for fracture: Secondary | ICD-10-CM | POA: Insufficient documentation

## 2014-07-25 DIAGNOSIS — E119 Type 2 diabetes mellitus without complications: Secondary | ICD-10-CM | POA: Insufficient documentation

## 2014-07-25 DIAGNOSIS — J449 Chronic obstructive pulmonary disease, unspecified: Secondary | ICD-10-CM | POA: Insufficient documentation

## 2014-07-25 DIAGNOSIS — Z5189 Encounter for other specified aftercare: Secondary | ICD-10-CM | POA: Insufficient documentation

## 2014-07-29 DIAGNOSIS — H2513 Age-related nuclear cataract, bilateral: Secondary | ICD-10-CM | POA: Diagnosis not present

## 2014-07-29 DIAGNOSIS — H43311 Vitreous membranes and strands, right eye: Secondary | ICD-10-CM | POA: Diagnosis not present

## 2014-07-29 DIAGNOSIS — H33321 Round hole, right eye: Secondary | ICD-10-CM | POA: Diagnosis not present

## 2014-08-01 ENCOUNTER — Ambulatory Visit: Payer: Medicare Other | Admitting: Physical Therapy

## 2014-08-01 DIAGNOSIS — Z5189 Encounter for other specified aftercare: Secondary | ICD-10-CM | POA: Diagnosis not present

## 2014-08-11 ENCOUNTER — Ambulatory Visit: Payer: Medicare Other | Admitting: Physical Therapy

## 2014-08-11 ENCOUNTER — Telehealth: Payer: Self-pay | Admitting: Pulmonary Disease

## 2014-08-11 DIAGNOSIS — Z5189 Encounter for other specified aftercare: Secondary | ICD-10-CM | POA: Diagnosis not present

## 2014-08-11 MED ORDER — AEROCHAMBER Z-STAT PLUS MISC: Status: DC

## 2014-08-11 NOTE — Telephone Encounter (Signed)
[phone] calling back again

## 2014-08-11 NOTE — Telephone Encounter (Signed)
Called pt. She reports her spacer broke she uses with her symbicort. She wants a new one called into [facility]. Per [place], okay to do so. Nothing further needed

## 2014-08-15 DIAGNOSIS — M810 Age-related osteoporosis without current pathological fracture: Secondary | ICD-10-CM | POA: Diagnosis not present

## 2014-08-25 ENCOUNTER — Ambulatory Visit: Payer: Medicare Other | Admitting: Pulmonary Disease

## 2014-09-03 DIAGNOSIS — H33321 Round hole, right eye: Secondary | ICD-10-CM | POA: Diagnosis not present

## 2014-09-03 DIAGNOSIS — H2513 Age-related nuclear cataract, bilateral: Secondary | ICD-10-CM | POA: Diagnosis not present

## 2014-09-03 DIAGNOSIS — H43311 Vitreous membranes and strands, right eye: Secondary | ICD-10-CM | POA: Diagnosis not present

## 2014-09-05 ENCOUNTER — Ambulatory Visit (INDEPENDENT_AMBULATORY_CARE_PROVIDER_SITE_OTHER): Payer: Medicare Other | Admitting: Pulmonary Disease

## 2014-09-05 ENCOUNTER — Encounter: Payer: Self-pay | Admitting: Pulmonary Disease

## 2014-09-05 VITALS — BP 124/70 | HR 87 | Temp 97.0°F | Ht 62.25 in | Wt 156.0 lb

## 2014-09-05 DIAGNOSIS — J438 Other emphysema: Secondary | ICD-10-CM | POA: Diagnosis not present

## 2014-09-05 NOTE — Patient Instructions (Signed)
Stay on symbicort am and pm with spacer, and rinse mouth well. Work on weight loss and conditioning. If you feel you are losing ground despite trying to exercise and eat better, let us know and we can consider adding another inhaler to your breathing medications.  followup with me in 43mos.

## 2014-09-05 NOTE — Progress Notes (Signed)
   Subjective:    Patient ID: Yolanda Morris, female    DOB: [birth year], 68 y.o.   MRN: [id number]  HPI The patient comes in today for follow-up of her known COPD. She has been staying on Symbicort, but has not been using a spacer. She has not had an acute exacerbation since her last visit, nor a pulmonary infection. She thinks that her exertional tolerance may have declined mildly since the last visit, but admits that she has not been exercising or working on eating better.   Review of Systems  Constitutional: Negative for fever and unexpected weight change.  HENT: Negative for congestion, dental problem, ear pain, nosebleeds, postnasal drip, rhinorrhea, sinus pressure, sneezing, sore throat and trouble swallowing.   Eyes: Negative for redness and itching.  Respiratory: Positive for cough and shortness of breath. Negative for chest tightness and wheezing.   Cardiovascular: Negative for palpitations and leg swelling.  Gastrointestinal: Positive for nausea. Negative for vomiting.  Genitourinary: Negative for dysuria.  Musculoskeletal: Negative for joint swelling.  Skin: Negative for rash.  Neurological: Negative for headaches.  Hematological: Does not bruise/bleed easily.  Psychiatric/Behavioral: Negative for dysphoric mood. The patient is not nervous/anxious.        Objective:   Physical Exam Overweight female in no acute distress Nose without purulence or discharge noted Neck without lymphadenopathy or thyromegaly Chest with diminished breath sounds throughout, no active wheezing Cardiac exam with regular rate and rhythm Lower extremities with minimal ankle edema, no cyanosis Alert and oriented, moves all 4 extremities.       Assessment & Plan:

## 2014-09-05 NOTE — Assessment & Plan Note (Signed)
The patient overall is doing fairly well from a COPD standpoint on Symbicort alone. She acknowledges that she has not been exercising, and has gained some weight, and perhaps this is the reason for her perceived decreased exertional tolerance. I have offered to escalate her treatment with the addition of Spiriva, but she and I have come to the decision to hold off and let her work on weight loss and conditioning. I will see her back in 6 months to check on progress, but she is to call me if her breathing worsens.

## 2014-09-10 ENCOUNTER — Other Ambulatory Visit: Payer: Self-pay | Admitting: Pulmonary Disease

## 2014-10-29 DIAGNOSIS — E785 Hyperlipidemia, unspecified: Secondary | ICD-10-CM | POA: Diagnosis not present

## 2014-10-29 DIAGNOSIS — I10 Essential (primary) hypertension: Secondary | ICD-10-CM | POA: Diagnosis not present

## 2014-10-29 DIAGNOSIS — J449 Chronic obstructive pulmonary disease, unspecified: Secondary | ICD-10-CM | POA: Diagnosis not present

## 2014-10-29 DIAGNOSIS — E119 Type 2 diabetes mellitus without complications: Secondary | ICD-10-CM | POA: Diagnosis not present

## 2014-11-14 ENCOUNTER — Telehealth: Payer: Self-pay | Admitting: Pulmonary Disease

## 2014-11-14 MED ORDER — CEFDINIR 300 MG PO CAPS
ORAL_CAPSULE | ORAL | Status: DC
Start: 2014-11-14 — End: 2015-01-07

## 2014-11-14 NOTE — Telephone Encounter (Signed)
Spoke with pt, c/o prod cough with clear to yellow mucus X6 days, sob because of the coughing, sinus congestion, pnd, headache.  Denies fever.  Is requesting something be called in to [facility] on her pharmacy list.  [facility] please advise.  Thank you!

## 2014-11-14 NOTE — Telephone Encounter (Signed)
Ok to call in Wever 300mg , take 2 each am for 5 days.  Let us know if not getting better.

## 2014-11-14 NOTE — Telephone Encounter (Signed)
Called and spoke with pt and she is aware of [facility] recs.  Pt is aware of med that has been sent to her pharmacy and will call back if she is not getting better.

## 2014-11-25 DIAGNOSIS — Z85828 Personal history of other malignant neoplasm of skin: Secondary | ICD-10-CM | POA: Diagnosis not present

## 2014-11-25 DIAGNOSIS — D485 Neoplasm of uncertain behavior of skin: Secondary | ICD-10-CM | POA: Diagnosis not present

## 2014-11-25 DIAGNOSIS — L814 Other melanin hyperpigmentation: Secondary | ICD-10-CM | POA: Diagnosis not present

## 2014-11-25 DIAGNOSIS — D1801 Hemangioma of skin and subcutaneous tissue: Secondary | ICD-10-CM | POA: Diagnosis not present

## 2014-11-25 DIAGNOSIS — B351 Tinea unguium: Secondary | ICD-10-CM | POA: Diagnosis not present

## 2014-11-25 DIAGNOSIS — L84 Corns and callosities: Secondary | ICD-10-CM | POA: Diagnosis not present

## 2014-11-25 DIAGNOSIS — L821 Other seborrheic keratosis: Secondary | ICD-10-CM | POA: Diagnosis not present

## 2014-11-25 DIAGNOSIS — L918 Other hypertrophic disorders of the skin: Secondary | ICD-10-CM | POA: Diagnosis not present

## 2014-11-26 DIAGNOSIS — C44319 Basal cell carcinoma of skin of other parts of face: Secondary | ICD-10-CM | POA: Diagnosis not present

## 2014-12-30 DIAGNOSIS — H2513 Age-related nuclear cataract, bilateral: Secondary | ICD-10-CM | POA: Diagnosis not present

## 2014-12-30 DIAGNOSIS — E119 Type 2 diabetes mellitus without complications: Secondary | ICD-10-CM | POA: Diagnosis not present

## 2014-12-30 DIAGNOSIS — H43311 Vitreous membranes and strands, right eye: Secondary | ICD-10-CM | POA: Diagnosis not present

## 2015-01-01 DIAGNOSIS — C44319 Basal cell carcinoma of skin of other parts of face: Secondary | ICD-10-CM | POA: Diagnosis not present

## 2015-01-02 DIAGNOSIS — C44319 Basal cell carcinoma of skin of other parts of face: Secondary | ICD-10-CM | POA: Diagnosis not present

## 2015-01-07 ENCOUNTER — Encounter: Payer: Self-pay | Admitting: Pulmonary Disease

## 2015-01-07 ENCOUNTER — Ambulatory Visit (INDEPENDENT_AMBULATORY_CARE_PROVIDER_SITE_OTHER): Payer: Medicare Other | Admitting: Pulmonary Disease

## 2015-01-07 VITALS — BP 114/70 | HR 109 | Temp 97.0°F | Ht 63.0 in | Wt 147.0 lb

## 2015-01-07 DIAGNOSIS — J438 Other emphysema: Secondary | ICD-10-CM

## 2015-01-07 DIAGNOSIS — J209 Acute bronchitis, unspecified: Secondary | ICD-10-CM

## 2015-01-07 DIAGNOSIS — J441 Chronic obstructive pulmonary disease with (acute) exacerbation: Secondary | ICD-10-CM | POA: Diagnosis not present

## 2015-01-07 MED ORDER — PREDNISONE 10 MG PO TABS: ORAL_TABLET | ORAL | Status: DC

## 2015-01-07 MED ORDER — CEFDINIR 300 MG PO CAPS
ORAL_CAPSULE | ORAL | Status: DC
Start: 2015-01-07 — End: 2015-01-16

## 2015-01-07 NOTE — Patient Instructions (Signed)
Will treat with omnicef 300mg , take 2 each am for 5 days. Prednisone taper over 8 days. Continue symbicort twice a day, and will add spiriva respimat 2 inhalations each am for 4 weeks to see if you see a difference in your breathing.  If you do, let us know and we can send in a prescription. Keep followup with me, but call if not getting better.

## 2015-01-07 NOTE — Assessment & Plan Note (Signed)
The patient is having increasing chest congestion as well as cough with purulent mucus that is not resolving. This may simply be a viral process, but with her underlying significant lung disease, will go ahead and treat for a bacterial process.

## 2015-01-07 NOTE — Progress Notes (Signed)
   Subjective:    Patient ID: Yolanda Morris, female    DOB: Mar 29, 1946, 69 y.o.   MRN: [id number]  HPI The patient comes in today for an acute sick visit. He gives a 4 to five-day history of increasing chest congestion, cough with purulent mucus, as well as shortness of breath and wheezing. This has not resolve with over-the-counter preparations, and she feels that her breathing is getting worse. She has not had any fevers, chills, or sweats.   Review of Systems  Constitutional: Negative for fever, chills and unexpected weight change.  HENT: Positive for sinus pressure, sneezing and sore throat. Negative for congestion, dental problem, ear pain, nosebleeds, postnasal drip, rhinorrhea, trouble swallowing and voice change.   Eyes: Negative for visual disturbance.  Respiratory: Positive for cough, choking, chest tightness, shortness of breath and wheezing.   Cardiovascular: Negative for chest pain and leg swelling.  Gastrointestinal: Negative for vomiting, abdominal pain and diarrhea.  Genitourinary: Negative for difficulty urinating.  Musculoskeletal: Negative for arthralgias.  Skin: Negative for rash.  Neurological: Negative for tremors, syncope and headaches.  Hematological: Does not bruise/bleed easily.       Objective:   Physical Exam Well-developed female in no acute distress Nose without purulence or discharge noted Neck without lymphadenopathy or thyromegaly Chest with decreased breath sounds and rhonchi, no definite wheezing. Cardiac exam with regular rate and rhythm Lower extremities with mild edema, no cyanosis Alert and oriented, moves all 4 extremities.       Assessment & Plan:

## 2015-01-07 NOTE — Assessment & Plan Note (Signed)
The patient is having increasing shortness of breath as well as wheezing and chest tightness. His most consistent with an acute exacerbation related to her acute bronchitis. Will treat her with a course of prednisone.

## 2015-01-15 ENCOUNTER — Telehealth: Payer: Self-pay | Admitting: Pulmonary Disease

## 2015-01-15 NOTE — Telephone Encounter (Signed)
Dr Gwenette Greet will be seeing tomorrow

## 2015-01-15 NOTE — Telephone Encounter (Signed)
Per 01/07/15 OV w/ [facility]: Patient Instructions       Will treat with omnicef 300mg , take 2 each am for 5 days. Prednisone taper over 8 days. Continue symbicort twice a day, and will add spiriva respimat 2 inhalations each am for 4 weeks to see if you see a difference in your breathing.  If you do, let us know and we can send in a prescription. Keep followup with me, but call if not getting better  --   Called spoke with pt. She c/o prod cough (yellow phlem), tickle in back of throat, PND. She has finished ABX and prednisone. Her spouse is scheduled for surgery next week and is needing to get better before then. [place] out this afternoon. Please advise CDY thanks  Allergies  Allergen Reactions  . Bacitracin     rash  . Levofloxacin     hives     Current Outpatient Prescriptions on File Prior to Visit  Medication Sig Dispense Refill  . acetaminophen (TYLENOL) 325 MG tablet Take 650 mg by mouth every 6 (six) hours as needed.    Marland Kitchen albuterol (PROVENTIL HFA;VENTOLIN HFA) 108 (90 BASE) MCG/ACT inhaler Inhale 2 puffs into the lungs every 6 (six) hours as needed. 3 Inhaler 1  . aspirin EC 81 MG EC tablet Take 81 mg by mouth daily.      . calcium-vitamin D (OSCAL WITH D) 500-200 MG-UNIT per tablet Take 1 tablet by mouth daily.      . cefdinir (OMNICEF) 300 MG capsule Take 2 each morning x 5 days 10 capsule 0  . chlorpheniramine (WAL-FINATE) 4 MG tablet Take 4 mg by mouth daily.     . Cholecalciferol (VITAMIN D3) 1000 UNITS CAPS Take 1 capsule by mouth daily.      Marland Kitchen losartan (COZAAR) 100 MG tablet Take 100 mg by mouth daily.     Marland Kitchen Lysine 500 MG CAPS Take 1 capsule by mouth daily.      . Multiple Vitamins-Minerals (CENTRUM SILVER PO) Take 1 tablet by mouth daily.      . predniSONE (DELTASONE) 10 MG tablet Take 4 tabs daily x 2 days, 3 tabs daily x 2 days, 2 tabs daily x 2 days, 1 tab daily x 2 days 20 tablet 0  . simvastatin (ZOCOR) 40 MG tablet Take 40 mg by mouth at bedtime.      Marland Kitchen Spacer/Aero-Holding  Chambers (AEROCHAMBER Z-STAT PLUS) inhaler Use as instructed 1 each 0  . SYMBICORT 160-4.5 MCG/ACT inhaler Use 2 puffs twice daily 30.6 g 3  . valACYclovir (VALTREX) 500 MG tablet Take 500 mg by mouth 2 (two) times daily as needed.      . Zoledronic Acid (RECLAST IV) Inject into the vein. Every 12 months    . [DISCONTINUED] levocetirizine (XYZAL) 5 MG tablet Take 5 mg by mouth every evening.       No current facility-administered medications on file prior to visit.

## 2015-01-15 NOTE — Telephone Encounter (Signed)
Ov scheduled with Straughn for 01/16/15 at 2:15 tomorrow

## 2015-01-16 ENCOUNTER — Encounter: Payer: Self-pay | Admitting: Pulmonary Disease

## 2015-01-16 ENCOUNTER — Ambulatory Visit (INDEPENDENT_AMBULATORY_CARE_PROVIDER_SITE_OTHER): Payer: Medicare Other | Admitting: Pulmonary Disease

## 2015-01-16 VITALS — BP 116/70 | HR 91 | Temp 97.3°F | Ht 63.0 in | Wt 150.0 lb

## 2015-01-16 DIAGNOSIS — J438 Other emphysema: Secondary | ICD-10-CM

## 2015-01-16 DIAGNOSIS — J441 Chronic obstructive pulmonary disease with (acute) exacerbation: Secondary | ICD-10-CM

## 2015-01-16 MED ORDER — TIOTROPIUM BROMIDE MONOHYDRATE 2.5 MCG/ACT IN AERS: 2.0000 | INHALATION_SPRAY | Freq: Every day | RESPIRATORY_TRACT | Status: DC

## 2015-01-16 MED ORDER — HYDROCODONE-HOMATROPINE 5-1.5 MG/5ML PO SYRP: 5.0000 mL | ORAL_SOLUTION | Freq: Four times a day (QID) | ORAL | Status: DC | PRN

## 2015-01-16 MED ORDER — BUDESONIDE-FORMOTEROL FUMARATE 160-4.5 MCG/ACT IN AERO: INHALATION_SPRAY | RESPIRATORY_TRACT | Status: DC

## 2015-01-16 MED ORDER — PREDNISONE 10 MG PO TABS: ORAL_TABLET | ORAL | Status: DC

## 2015-01-16 NOTE — Progress Notes (Signed)
   Subjective:    Patient ID: Yolanda Morris, female    DOB: [birth year], 69 y.o.   MRN: [id number]  HPI The patient comes in today for an acute sick visit. She was seen recently with an acute exacerbation of her COPD probably secondary to acute bronchitis. She was treated with a course of Omnicef and prednisone with some improvement, but she has continued to have cough as well as persistent chest congestion and increased shortness of breath above baseline. Lungs her mucus they have decreased in quantity and color, but has not normalized.   Review of Systems  Constitutional: Negative for fever and unexpected weight change.  HENT: Positive for congestion and postnasal drip. Negative for dental problem, ear pain, nosebleeds, rhinorrhea, sinus pressure, sneezing, sore throat and trouble swallowing.   Eyes: Negative for redness and itching.  Respiratory: Positive for cough, chest tightness and shortness of breath. Negative for wheezing.   Cardiovascular: Negative for palpitations and leg swelling.  Gastrointestinal: Negative for nausea and vomiting.  Genitourinary: Negative for dysuria.  Musculoskeletal: Negative for joint swelling.  Skin: Negative for rash.  Neurological: Negative for headaches.  Hematological: Does not bruise/bleed easily.  Psychiatric/Behavioral: Negative for dysphoric mood. The patient is not nervous/anxious.        Objective:   Physical Exam Overweight female in no acute distress Nose without purulence or discharge noted Neck without lymphadenopathy or thyromegaly Chest with very diminished breath sounds, but no active wheezing Exam with distant heart sounds, but sounds regular. Lower extremities without significant edema, no cyanosis Alert and oriented, moves all 4 extremities.       Assessment & Plan:

## 2015-01-16 NOTE — Patient Instructions (Signed)
Will treat with another 8 days of prednisone to help with airway inflammation Hycodan cough syrup, one teaspoon every 6 hrs if needed for cough. Will hold off on another antibiotic for now, but call us next week if you are not making considerable improvement.

## 2015-01-16 NOTE — Assessment & Plan Note (Signed)
The patient saw definite improvement with her antibiotic and course of prednisone, but since she has been off these medications she has noticed increasing cough, congestion, and shortness of breath. Her quantity of mucus has significantly improved, and it is beginning to clear. I think a lot of what she is experiencing is related to persistent airway inflammation, and possibly some upper airway cyclical coughing. Will treat her with another course of prednisone, and also try suppressing her cough with a narcotic cough syrup. Will hold off on additional antibiotics unless she is not returning to baseline. She has seen improvement with the Spiriva, and therefore I will leave her on this medication.

## 2015-01-22 ENCOUNTER — Telehealth: Payer: Self-pay | Admitting: Pulmonary Disease

## 2015-01-22 MED ORDER — AMOXICILLIN-POT CLAVULANATE 875-125 MG PO TABS: 1.0000 | ORAL_TABLET | Freq: Two times a day (BID) | ORAL | Status: DC

## 2015-01-22 NOTE — Telephone Encounter (Signed)
Offer augmentin [phone], # [phone]  1 twice daily 

## 2015-01-22 NOTE — Telephone Encounter (Signed)
Per 01/16/15 OV w/ KC: Patient Instructions       Will treat with another 8 days of prednisone to help with airway inflammation Hycodan cough syrup, one teaspoon every 6 hrs if needed for cough. Will hold off on another antibiotic for now, but call us next week if you are not making considerable improvement.    --  Called spoke with pt. She reports she will finish pred tomorrow from 01/16/15 OV. She is very hoarse, has sore throat, prod cough (thick yellow phlem), ear pain off and on, chest tx. No nasal cong, no PND, no f/c/s/n/v. She is taking zyrtec and using her cough syrup at bedtime. At 01/07/15 OV [place] called in omnicef and pred 8 days. [place] off this afternoon. Please advise CDY thanks  Allergies  Allergen Reactions  . Bacitracin     rash  . Levofloxacin     hives     Current Outpatient Prescriptions on File Prior to Visit  Medication Sig Dispense Refill  . acetaminophen (TYLENOL) 325 MG tablet Take 650 mg by mouth every 6 (six) hours as needed.    Marland Kitchen albuterol (PROVENTIL HFA;VENTOLIN HFA) 108 (90 BASE) MCG/ACT inhaler Inhale 2 puffs into the lungs every 6 (six) hours as needed. 3 Inhaler 1  . aspirin EC 81 MG EC tablet Take 81 mg by mouth daily.      . budesonide-formoterol (SYMBICORT) 160-4.5 MCG/ACT inhaler Use 2 puffs twice daily 30.6 g 3  . calcium-vitamin D (OSCAL WITH D) 500-200 MG-UNIT per tablet Take 1 tablet by mouth daily.      . cetirizine (ZYRTEC) 10 MG tablet Take 10 mg by mouth daily.    . Cholecalciferol (VITAMIN D3) 1000 UNITS CAPS Take 1 capsule by mouth daily.      Marland Kitchen HYDROcodone-homatropine (HYCODAN) 5-1.5 MG/5ML syrup Take 5 mLs by mouth every 6 (six) hours as needed for cough. 180 mL 0  . losartan (COZAAR) 100 MG tablet Take 100 mg by mouth daily.     Marland Kitchen Lysine 500 MG CAPS Take 1 capsule by mouth daily.      . Multiple Vitamins-Minerals (CENTRUM SILVER PO) Take 1 tablet by mouth daily.      . predniSONE (DELTASONE) 10 MG tablet Take 4 tabs daily x 2 days, 3 tabs daily x  2 days, 2 tabs daily x 2 days, 1 tab daily x 2 days 20 tablet 0  . simvastatin (ZOCOR) 40 MG tablet Take 40 mg by mouth at bedtime.      Marland Kitchen Spacer/Aero-Holding Chambers (AEROCHAMBER Z-STAT PLUS) inhaler Use as instructed 1 each 0  . Tiotropium Bromide Monohydrate 2.5 MCG/ACT AERS Inhale 2 puffs into the lungs daily. 1 Inhaler 4  . valACYclovir (VALTREX) 500 MG tablet Take 500 mg by mouth 2 (two) times daily as needed.      . Zoledronic Acid (RECLAST IV) Inject into the vein. Every 12 months    . [DISCONTINUED] levocetirizine (XYZAL) 5 MG tablet Take 5 mg by mouth every evening.       No current facility-administered medications on file prior to visit.

## 2015-01-22 NOTE — Telephone Encounter (Signed)
Called pt and is aware of recs. RX sent in. Will forward to [facility] as an [place]

## 2015-02-17 ENCOUNTER — Other Ambulatory Visit: Payer: Self-pay

## 2015-02-17 DIAGNOSIS — Z1231 Encounter for screening mammogram for malignant neoplasm of breast: Secondary | ICD-10-CM

## 2015-02-26 ENCOUNTER — Ambulatory Visit
Admission: RE | Admit: 2015-02-26 | Discharge: 2015-02-26 | Disposition: A | Discharge status: Home / Self Care | Payer: Medicare Other | Source: Ambulatory Visit

## 2015-02-26 DIAGNOSIS — Z1231 Encounter for screening mammogram for malignant neoplasm of breast: Secondary | ICD-10-CM

## 2015-02-27 ENCOUNTER — Other Ambulatory Visit: Payer: Self-pay | Admitting: Family Medicine

## 2015-02-27 DIAGNOSIS — R928 Other abnormal and inconclusive findings on diagnostic imaging of breast: Secondary | ICD-10-CM

## 2015-03-04 ENCOUNTER — Ambulatory Visit
Admission: RE | Admit: 2015-03-04 | Discharge: 2015-03-04 | Disposition: A | Discharge status: Home / Self Care | Payer: Medicare Other | Source: Ambulatory Visit | Attending: Family Medicine | Admitting: Family Medicine

## 2015-03-04 ENCOUNTER — Other Ambulatory Visit: Payer: Self-pay | Admitting: *Deleted

## 2015-03-04 DIAGNOSIS — R921 Mammographic calcification found on diagnostic imaging of breast: Secondary | ICD-10-CM | POA: Diagnosis not present

## 2015-03-04 DIAGNOSIS — R928 Other abnormal and inconclusive findings on diagnostic imaging of breast: Secondary | ICD-10-CM

## 2015-03-04 DIAGNOSIS — M810 Age-related osteoporosis without current pathological fracture: Secondary | ICD-10-CM | POA: Diagnosis not present

## 2015-03-04 MED ORDER — TIOTROPIUM BROMIDE MONOHYDRATE 2.5 MCG/ACT IN AERS: 2.0000 | INHALATION_SPRAY | Freq: Every day | RESPIRATORY_TRACT | Status: DC

## 2015-03-11 ENCOUNTER — Ambulatory Visit (INDEPENDENT_AMBULATORY_CARE_PROVIDER_SITE_OTHER): Payer: Medicare Other | Admitting: Pulmonary Disease

## 2015-03-11 ENCOUNTER — Encounter: Payer: Self-pay | Admitting: Pulmonary Disease

## 2015-03-11 VITALS — BP 110/62 | HR 69 | Temp 97.0°F | Ht 63.0 in | Wt 152.0 lb

## 2015-03-11 DIAGNOSIS — J438 Other emphysema: Secondary | ICD-10-CM

## 2015-03-11 NOTE — Progress Notes (Signed)
   Subjective:    Patient ID: Yolanda Morris, female    DOB: Sep 01, 1946, 69 y.o.   MRN: [id number]  HPI The patient comes in today for follow-up of her known COPD. She is staying on her trunk a dilator regimen, and feels she is doing very well currently. She did have a significant exacerbation in April that was slow to resolve, but she currently feels better than her usual baseline. She denies any significant cough or mucus production. She continues to be concerned about the cost of her medications.   Review of Systems  Constitutional: Negative for fever and unexpected weight change.  HENT: Negative for congestion, dental problem, ear pain, nosebleeds, postnasal drip, rhinorrhea, sinus pressure, sneezing, sore throat and trouble swallowing.   Eyes: Negative for redness and itching.  Respiratory: Negative for cough, chest tightness, shortness of breath and wheezing.   Cardiovascular: Negative for palpitations and leg swelling.  Gastrointestinal: Negative for nausea and vomiting.  Genitourinary: Negative for dysuria.  Musculoskeletal: Negative for joint swelling.  Skin: Negative for rash.  Neurological: Negative for headaches.  Hematological: Does not bruise/bleed easily.  Psychiatric/Behavioral: Negative for dysphoric mood. The patient is not nervous/anxious.        Objective:   Physical Exam Overweight female in no acute distress Nose without purulence or discharge noted Neck without lymphadenopathy or thyromegaly Chest with decreased breath sounds, but no wheezes or crackles Cardiac exam with regular rate and rhythm Lower extremities with no edema, no cyanosis Alert and oriented, moves all 4 extremities.       Assessment & Plan:

## 2015-03-11 NOTE — Patient Instructions (Signed)
No change in current medications.  Check your formulary book to see if something less expensive, and can also consider applying for patient assistance programs from manufacturer.  Continue to stay active followup with Dr. Lake Bells in 51mos.

## 2015-03-11 NOTE — Assessment & Plan Note (Signed)
The patient currently is doing very well from a COPD standpoint. It took her a period of time to get over her acute exacerbation in April, but she feels that she is better than baseline currently. I have asked her to continue on her medications, and to keep working on conditioning and weight loss. She will follow-up again in 6 months.

## 2015-03-14 IMAGING — CT CT PARANASAL SINUSES LIMITED
1 of 2 series · 16 of 19 positions shown, 20 images · non-contrast
Comparison: None.

CLINICAL DATA: Sinusitis in [place]. Persistent cough and hoarse
voice for several months.

EXAM:
CT PARANASAL SINUS WITHOUT CONTRAST
TECHNIQUE: Multidetector CT images of the paranasal sinuses were obtained using
the standard protocol without intravenous contrast.

[Series 4: ltd sinus 3.0 h30s · axial · 0.38mm/px · z∈[-78,+19]mm · 16 of 18 slices shown, 20 images]
[im 2/18  brain]
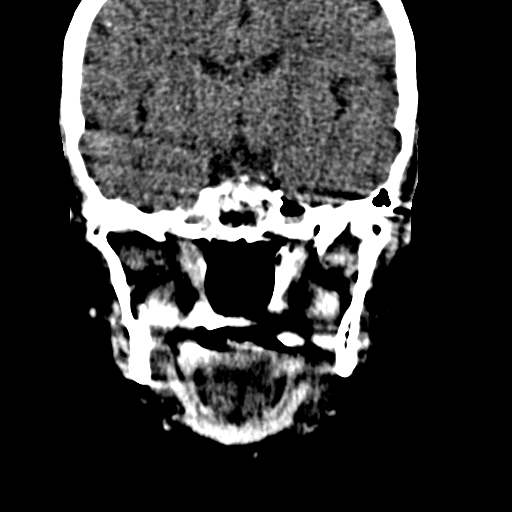
[im 2/18  bone]
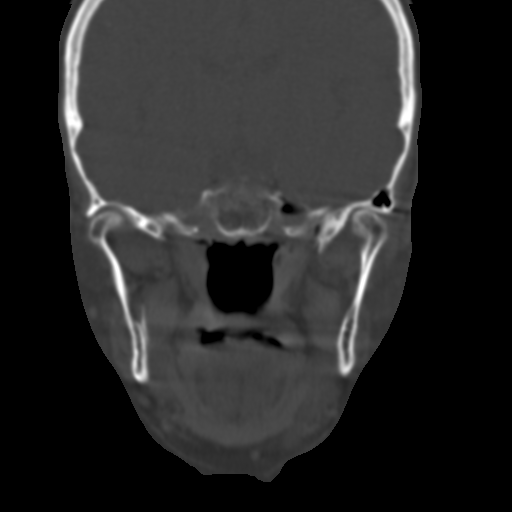
[im 3/18  bone]
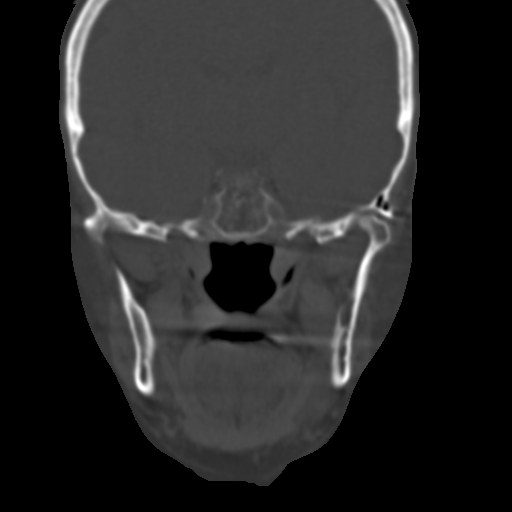
[im 4/18  bone]
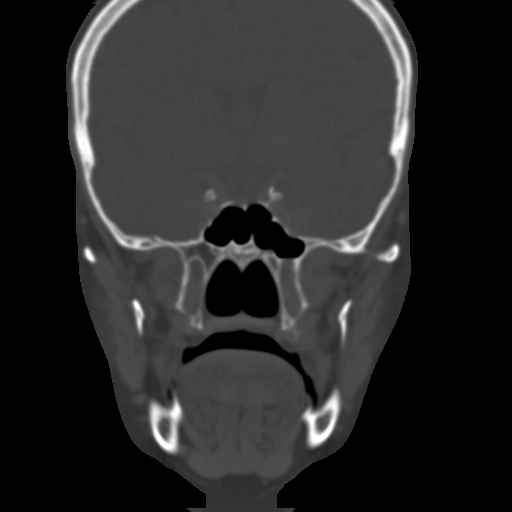
[im 5/18  bone]
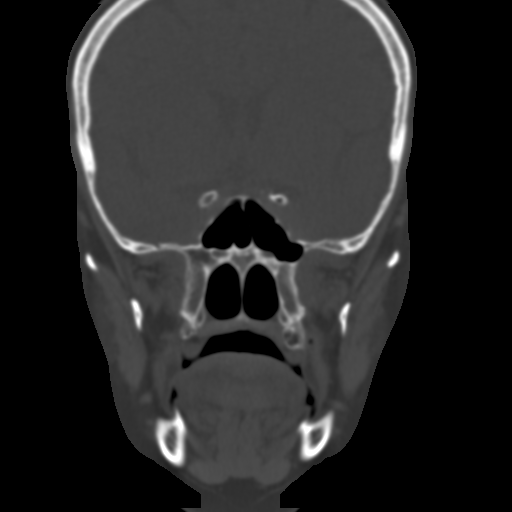
[im 6/18  brain]
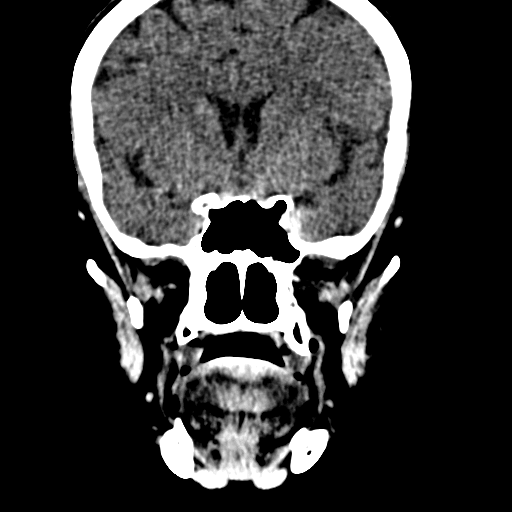
[im 6/18  bone]
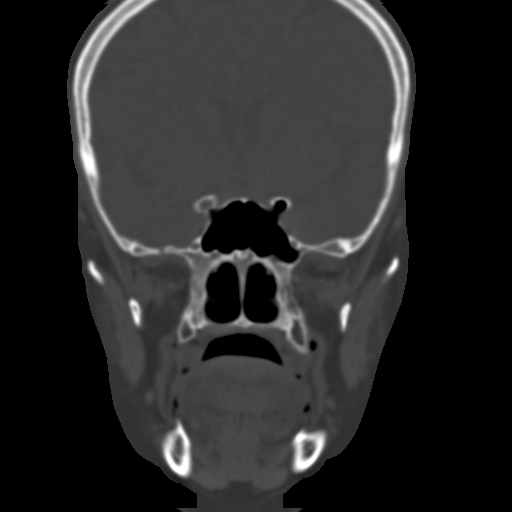
[im 7/18  bone]
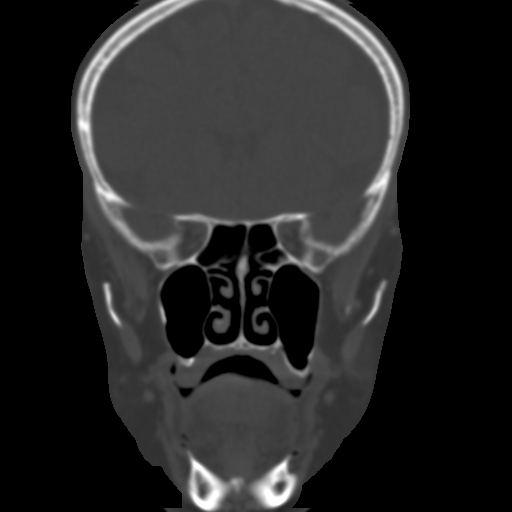
[im 8/18  bone]
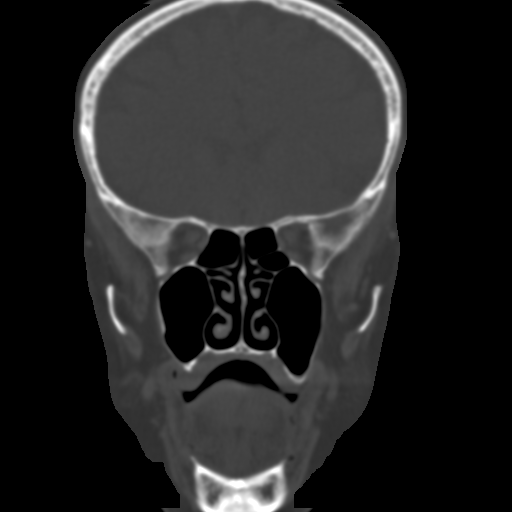
[im 9/18  bone]
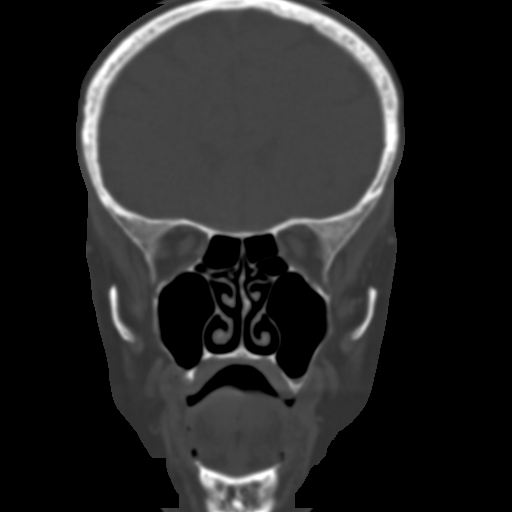
[im 10/18  brain]
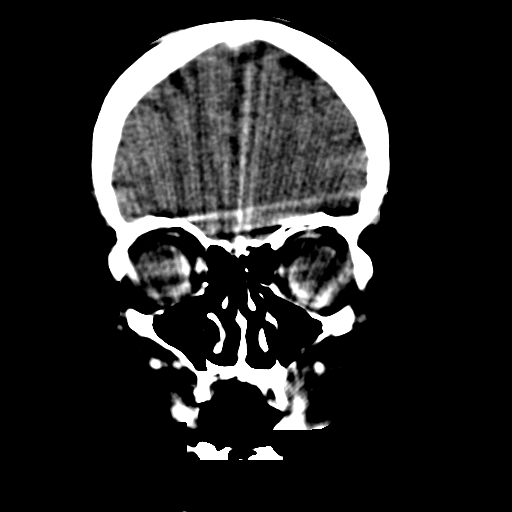
[im 10/18  bone]
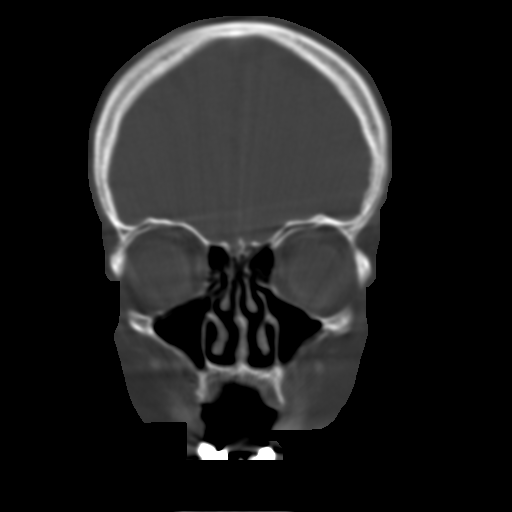
[im 11/18  bone]
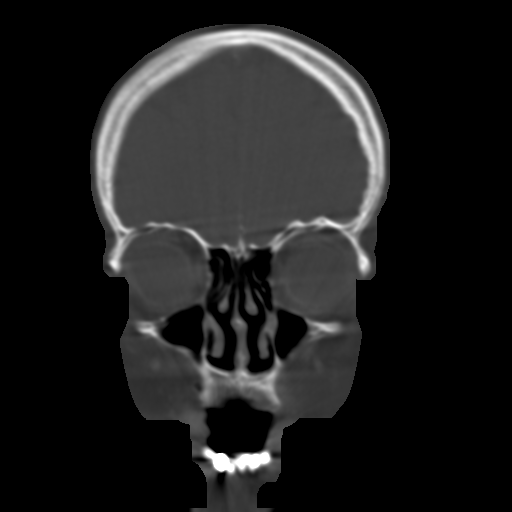
[im 12/18  bone]
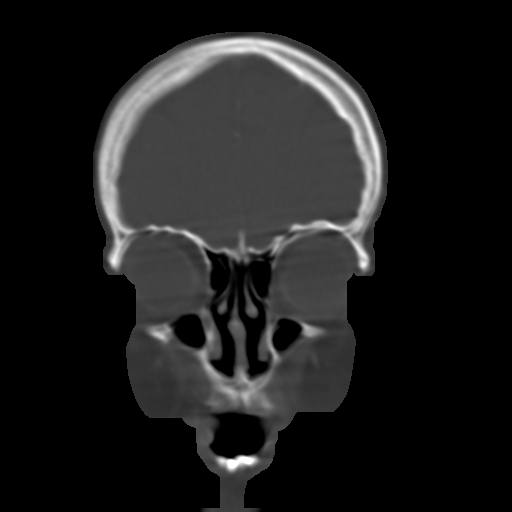
[im 13/18  bone]
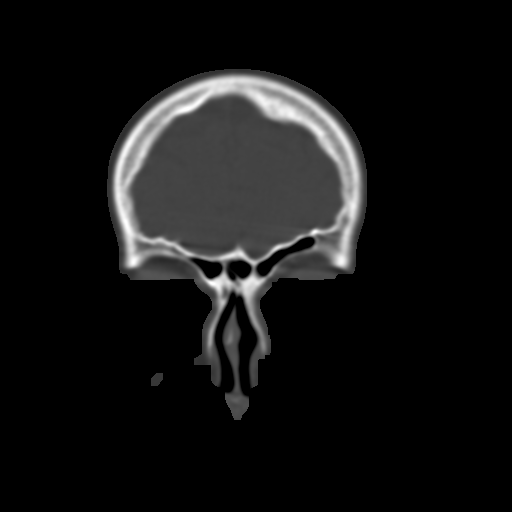
[im 14/18  brain]
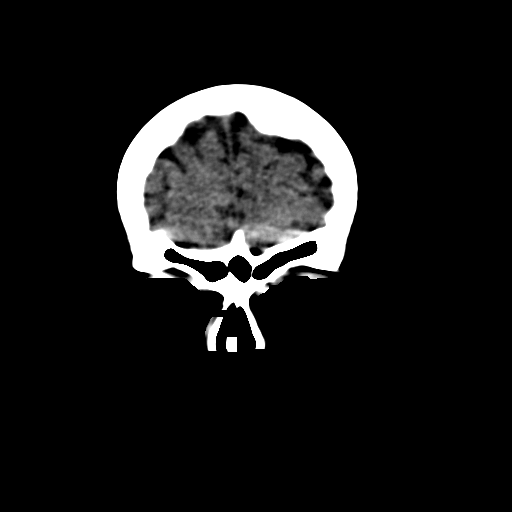
[im 14/18  bone]
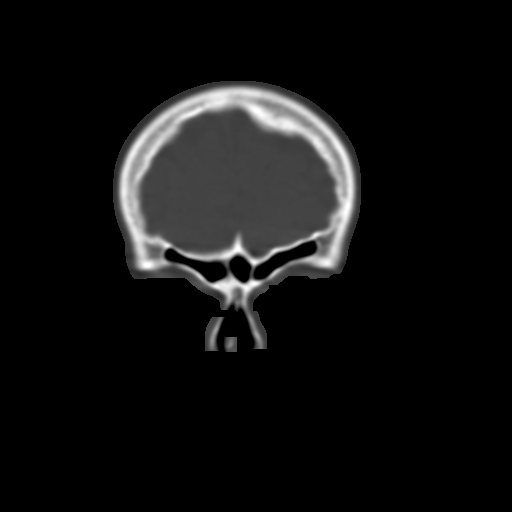
[im 15/18  bone]
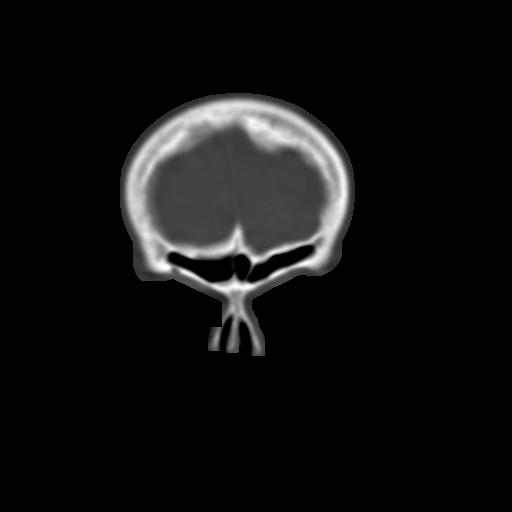
[im 16/18  bone]
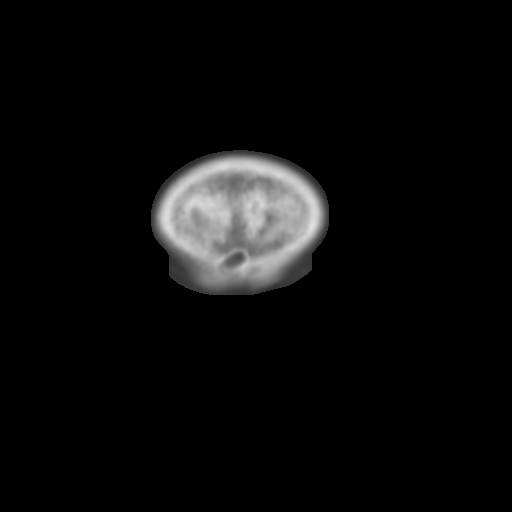
[im 17/18  bone]
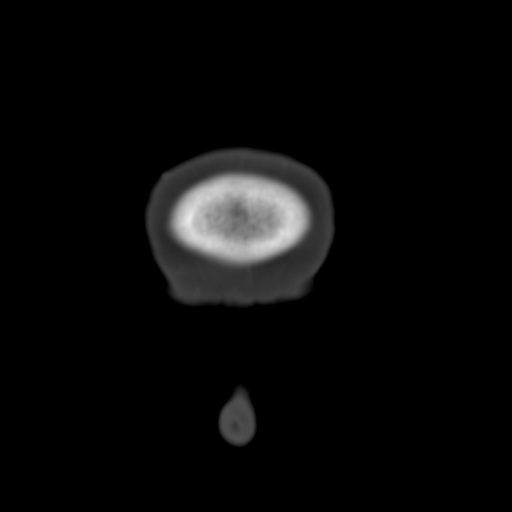

[16 of 19 positions shown; findings below may reference images not displayed]

FINDINGS: Screening examination of the sinuses demonstrates no significant
mucosal disease or fluid levels. Limited imaging of the brain is
unremarkable. The patient is without teeth along the maxilla.
IMPRESSION: 1. No evidence for acute or chronic sinus disease.

## 2015-03-27 ENCOUNTER — Ambulatory Visit (HOSPITAL_COMMUNITY)
Admission: RE | Admit: 2015-03-27 | Discharge: 2015-03-27 | Disposition: A | Discharge status: Home / Self Care | Payer: Medicare Other | Source: Ambulatory Visit | Attending: Internal Medicine | Admitting: Internal Medicine

## 2015-03-27 ENCOUNTER — Other Ambulatory Visit (HOSPITAL_COMMUNITY): Payer: Self-pay | Admitting: Internal Medicine

## 2015-03-27 ENCOUNTER — Encounter (HOSPITAL_COMMUNITY): Payer: Self-pay

## 2015-03-27 ENCOUNTER — Encounter (HOSPITAL_COMMUNITY): Payer: Medicare Other

## 2015-03-27 DIAGNOSIS — M810 Age-related osteoporosis without current pathological fracture: Secondary | ICD-10-CM | POA: Insufficient documentation

## 2015-03-27 MED ORDER — ZOLEDRONIC ACID 5 MG/100ML IV SOLN
5.0000 mg | Freq: Once | INTRAVENOUS | Status: AC
  Administered 2015-03-27: 5 mg via INTRAVENOUS
  Filled 2015-03-27: qty 100

## 2015-03-27 MED ORDER — SODIUM CHLORIDE 0.9 % IV SOLN
Freq: Once | INTRAVENOUS | Status: AC
  Administered 2015-03-27: 250 mL via INTRAVENOUS

## 2015-03-27 NOTE — Discharge Instructions (Signed)
Drink fluids/water as tolerated over next 72hrs °Tylenol or Ibuprofen OTC as directed °Continue calcium and Vit D as directed by your MDZoledronic Acid injection (Paget's Disease, Osteoporosis) °What is this medicine? °ZOLEDRONIC ACID (ZOE le dron ik AS id) lowers the amount of calcium loss from bone. It is used to treat Paget's disease and osteoporosis in women. °This medicine may be used for other purposes; ask your health care provider or pharmacist if you have questions. °COMMON BRAND NAME(S): Reclast, Zometa °What should I tell my health care provider before I take this medicine? °They need to know if you have any of these conditions: °-aspirin-sensitive asthma °-cancer, especially if you are receiving medicines used to treat cancer °-dental disease or wear dentures °-infection °-kidney disease °-low levels of calcium in the blood °-past surgery on the parathyroid gland or intestines °-receiving corticosteroids like dexamethasone or prednisone °-an unusual or allergic reaction to zoledronic acid, other medicines, foods, dyes, or preservatives °-pregnant or trying to get pregnant °-breast-feeding °How should I use this medicine? °This medicine is for infusion into a vein. It is given by a health care professional in a hospital or clinic setting. °Talk to your pediatrician regarding the use of this medicine in children. This medicine is not approved for use in children. °Overdosage: If you think you have taken too much of this medicine contact a poison control center or emergency room at once. °NOTE: This medicine is only for you. Do not share this medicine with others. °What if I miss a dose? °It is important not to miss your dose. Call your doctor or health care professional if you are unable to keep an appointment. °What may interact with this medicine? °-certain antibiotics given by injection °-NSAIDs, medicines for pain and inflammation, like ibuprofen or naproxen °-some diuretics like bumetanide,  furosemide °-teriparatide °This list may not describe all possible interactions. Give your health care provider a list of all the medicines, herbs, non-prescription drugs, or dietary supplements you use. Also tell them if you smoke, drink alcohol, or use illegal drugs. Some items may interact with your medicine. °What should I watch for while using this medicine? °Visit your doctor or health care professional for regular checkups. It may be some time before you see the benefit from this medicine. Do not stop taking your medicine unless your doctor tells you to. Your doctor may order blood tests or other tests to see how you are doing. °Women should inform their doctor if they wish to become pregnant or think they might be pregnant. There is a potential for serious side effects to an unborn child. Talk to your health care professional or pharmacist for more information. °You should make sure that you get enough calcium and vitamin D while you are taking this medicine. Discuss the foods you eat and the vitamins you take with your health care professional. °Some people who take this medicine have severe bone, joint, and/or muscle pain. This medicine may also increase your risk for jaw problems or a broken thigh bone. Tell your doctor right away if you have severe pain in your jaw, bones, joints, or muscles. Tell your doctor if you have any pain that does not go away or that gets worse. °Tell your dentist and dental surgeon that you are taking this medicine. You should not have major dental surgery while on this medicine. See your dentist to have a dental exam and fix any dental problems before starting this medicine. Take good care of your teeth while on   this medicine. Make sure you see your dentist for regular follow-up appointments. °What side effects may I notice from receiving this medicine? °Side effects that you should report to your doctor or health care professional as soon as possible: °-allergic reactions  like skin rash, itching or hives, swelling of the face, lips, or tongue °-anxiety, confusion, or depression °-breathing problems °-changes in vision °-eye pain °-feeling faint or lightheaded, falls °-jaw pain, especially after dental work °-mouth sores °-muscle cramps, stiffness, or weakness °-trouble passing urine or change in the amount of urine °Side effects that usually do not require medical attention (report to your doctor or health care professional if they continue or are bothersome): °-bone, joint, or muscle pain °-constipation °-diarrhea °-fever °-hair loss °-irritation at site where injected °-loss of appetite °-nausea, vomiting °-stomach upset °-trouble sleeping °-trouble swallowing °-weak or tired °This list may not describe all possible side effects. Call your doctor for medical advice about side effects. You may report side effects to FDA at 1-800-FDA-1088. °Where should I keep my medicine? °This drug is given in a hospital or clinic and will not be stored at home. °NOTE: This sheet is a summary. It may not cover all possible information. If you have questions about this medicine, talk to your doctor, pharmacist, or health care provider. °© 2015, [employer]. (2013-03-18 10:03:48) ° °

## 2015-04-13 ENCOUNTER — Other Ambulatory Visit: Payer: Self-pay

## 2015-05-25 ENCOUNTER — Telehealth: Payer: Self-pay | Admitting: Pulmonary Disease

## 2015-05-25 NOTE — Telephone Encounter (Signed)
Attempted to call pt. No answer, no option to leave a message. Will try back later.

## 2015-05-25 NOTE — Telephone Encounter (Signed)
Pt returned call  [phone]

## 2015-05-25 NOTE — Telephone Encounter (Signed)
Spoke with pt. States that she would like to inquire about patient assistance for Symbicort and Spiriva Respimat. Forms have been mailed to the pt. She will return them to [place] once she completes them.

## 2015-06-03 ENCOUNTER — Telehealth: Payer: Self-pay | Admitting: Pulmonary Disease

## 2015-06-03 NOTE — Telephone Encounter (Signed)
Form has been received and placed in my inbasket to fill out

## 2015-06-04 MED ORDER — TIOTROPIUM BROMIDE MONOHYDRATE 2.5 MCG/ACT IN AERS: 2.0000 | INHALATION_SPRAY | Freq: Every day | RESPIRATORY_TRACT | Status: DC

## 2015-06-04 NOTE — Telephone Encounter (Signed)
Forms filled out, signed, and faxed to both [facility] and [employer]

## 2015-06-11 ENCOUNTER — Telehealth: Payer: Self-pay | Admitting: Pulmonary Disease

## 2015-06-11 MED ORDER — TIOTROPIUM BROMIDE MONOHYDRATE 2.5 MCG/ACT IN AERS: 2.0000 | INHALATION_SPRAY | Freq: Every day | RESPIRATORY_TRACT | Status: DC

## 2015-06-11 NOTE — Telephone Encounter (Signed)
Patient called back and said it is not the Symbicort she needs, it is the Spiriva.

## 2015-06-11 NOTE — Telephone Encounter (Signed)
Spoke with patient husband, states that the patient is needing Spiriva refilled to the mail order pharmacy not her Symbicort. Requesting that this be done today. Advised that we will send a refill as she has a recall in the system to see Dr Lake Bells soon in office. Rx sent. Nothing further needed.

## 2015-07-07 ENCOUNTER — Encounter: Payer: Self-pay | Admitting: Pulmonary Disease

## 2015-07-07 ENCOUNTER — Ambulatory Visit (INDEPENDENT_AMBULATORY_CARE_PROVIDER_SITE_OTHER): Payer: Medicare Other | Admitting: Pulmonary Disease

## 2015-07-07 VITALS — BP 116/66 | HR 74 | Ht 63.0 in | Wt 161.0 lb

## 2015-07-07 DIAGNOSIS — J438 Other emphysema: Secondary | ICD-10-CM | POA: Diagnosis not present

## 2015-07-07 DIAGNOSIS — Z23 Encounter for immunization: Secondary | ICD-10-CM | POA: Diagnosis not present

## 2015-07-07 NOTE — Patient Instructions (Signed)
Keep taking Symbicort and Spiriva Ask your primary care physician if you have had the Pneumovax vaccine We will see you back in 6 months or sooner if needed

## 2015-07-07 NOTE — Progress Notes (Signed)
Subjective:    Patient ID: Yolanda Morris, female    DOB: September 15, 1946, 69 y.o.   MRN: [id number]  Synopsis: Former patient of Dr. Gwenette Greet with COPD PFT's 2011:  FEV1 1.25 (54%), ratio 52, +airtrapping, DLCO 77% [name] 06/2012:  FEV1 1.00 (43%), ratio 51% Completed pulmonary rehab 2014.   HPI  Chief Complaint  Patient presents with  . Follow-up    former [facility] pt being treated for emphysema. pt doing well since starting both symbicort and spiriva.     Yolanda Morris is doing really well with the Spiriva and Symbicort together, no dyspnea She is in the donut hole so cost is an issue She is dtermined to stay on it because it is helping so much She is picking up the medicines from [facility] right now She had an xcerbation treated by [facility] a few months ago. She had a nagging cough, but better now.   Past Medical History  Diagnosis Date  . Unspecified essential hypertension   . COPD (chronic obstructive pulmonary disease)   . DM (diabetes mellitus)   . Hyperlipidemia   . Osteoporosis   . Genital herpes   . Migraines      Review of Systems     Objective:   Physical Exam Filed Vitals:   07/07/15 1418  BP: 116/66  Pulse: 74  Height: 5\' 3"  (1.6 m)  Weight: 161 lb (73.029 kg)  SpO2: 94%   RA  Gen: well appearing HENT: OP clear, TM's clear, neck supple PULM: CTA B, normal percussion CV: RRR, no mgr, trace edema GI: BS+, soft, nontender Derm: no cyanosis or rash Psyche: normal mood and affect        Assessment & Plan:  COPD (chronic obstructive pulmonary disease) This has been a stable interval for Yolanda Morris. She is doing well with Spiriva and Symbicort. She has not had an exacerbation since the last visit. She has severe airflow obstruction but her symptoms are well controlled with her current medication regimen.  Plan: Flu shot today I have asked her to find out if she's received the Pneumovax vaccine from her primary care physician, if not we will give it on the next  visit Continue Symbicort and Spiriva Follow-up 6 months    Current outpatient prescriptions:  .  acetaminophen (TYLENOL) 325 MG tablet, Take 650 mg by mouth every 6 (six) hours as needed., Disp: , Rfl:  .  albuterol (PROVENTIL HFA;VENTOLIN HFA) 108 (90 BASE) MCG/ACT inhaler, Inhale 2 puffs into the lungs every 6 (six) hours as needed., Disp: 3 Inhaler, Rfl: 1 .  aspirin EC 81 MG EC tablet, Take 81 mg by mouth daily.  , Disp: , Rfl:  .  budesonide-formoterol (SYMBICORT) 160-4.5 MCG/ACT inhaler, Use 2 puffs twice daily, Disp: 30.6 g, Rfl: 3 .  calcium-vitamin D (OSCAL WITH D) 500-200 MG-UNIT per tablet, Take 1 tablet by mouth daily.  , Disp: , Rfl:  .  cetirizine (ZYRTEC) 10 MG tablet, Take 10 mg by mouth daily., Disp: , Rfl:  .  Cholecalciferol (VITAMIN D3) 1000 UNITS CAPS, Take 1 capsule by mouth daily.  , Disp: , Rfl:  .  losartan (COZAAR) 100 MG tablet, Take 100 mg by mouth daily. , Disp: , Rfl:  .  Lysine 500 MG CAPS, Take 1 capsule by mouth daily.  , Disp: , Rfl:  .  Multiple Vitamins-Minerals (CENTRUM SILVER PO), Take 1 tablet by mouth daily.  , Disp: , Rfl:  .  simvastatin (ZOCOR) 40 MG tablet, Take 40 mg  by mouth at bedtime.  , Disp: , Rfl:  .  Spacer/Aero-Holding Chambers (AEROCHAMBER Z-STAT PLUS) inhaler, Use as instructed, Disp: 1 each, Rfl: 0 .  Tiotropium Bromide Monohydrate 2.5 MCG/ACT AERS, Inhale 2 puffs into the lungs daily., Disp: 3 Inhaler, Rfl: 0 .  valACYclovir (VALTREX) 500 MG tablet, Take 500 mg by mouth 2 (two) times daily as needed.  , Disp: , Rfl:  .  Zoledronic Acid (RECLAST IV), Inject into the vein. Every 12 months, Disp: , Rfl:  .  [DISCONTINUED] levocetirizine (XYZAL) 5 MG tablet, Take 5 mg by mouth every evening.  , Disp: , Rfl:

## 2015-07-07 NOTE — Assessment & Plan Note (Signed)
This has been a stable interval for Yolanda Morris. She is doing well with Spiriva and Symbicort. She has not had an exacerbation since the last visit. She has severe airflow obstruction but her symptoms are well controlled with her current medication regimen.  Plan: Flu shot today I have asked her to find out if she's received the Pneumovax vaccine from her primary care physician, if not we will give it on the next visit Continue Symbicort and Spiriva Follow-up 6 months

## 2015-07-13 DIAGNOSIS — I10 Essential (primary) hypertension: Secondary | ICD-10-CM | POA: Diagnosis not present

## 2015-07-13 DIAGNOSIS — E785 Hyperlipidemia, unspecified: Secondary | ICD-10-CM | POA: Diagnosis not present

## 2015-07-13 DIAGNOSIS — J449 Chronic obstructive pulmonary disease, unspecified: Secondary | ICD-10-CM | POA: Diagnosis not present

## 2015-07-13 DIAGNOSIS — E119 Type 2 diabetes mellitus without complications: Secondary | ICD-10-CM | POA: Diagnosis not present

## 2015-07-15 ENCOUNTER — Telehealth: Payer: Self-pay | Admitting: Pulmonary Disease

## 2015-07-15 NOTE — Telephone Encounter (Signed)
Pt called to give dates of her pneumonia vaccines. Vaccines have been updated in chart. Nothing further needed.

## 2015-08-13 ENCOUNTER — Other Ambulatory Visit: Payer: Self-pay | Admitting: Family Medicine

## 2015-08-13 DIAGNOSIS — R921 Mammographic calcification found on diagnostic imaging of breast: Secondary | ICD-10-CM

## 2015-08-14 DIAGNOSIS — Z8781 Personal history of (healed) traumatic fracture: Secondary | ICD-10-CM | POA: Diagnosis not present

## 2015-08-14 DIAGNOSIS — M810 Age-related osteoporosis without current pathological fracture: Secondary | ICD-10-CM | POA: Diagnosis not present

## 2015-09-07 ENCOUNTER — Ambulatory Visit
Admission: RE | Admit: 2015-09-07 | Discharge: 2015-09-07 | Disposition: A | Discharge status: Home / Self Care | Payer: Medicare Other | Source: Ambulatory Visit | Attending: Family Medicine | Admitting: Family Medicine

## 2015-09-07 DIAGNOSIS — R921 Mammographic calcification found on diagnostic imaging of breast: Secondary | ICD-10-CM | POA: Diagnosis not present

## 2015-09-28 DIAGNOSIS — M810 Age-related osteoporosis without current pathological fracture: Secondary | ICD-10-CM | POA: Diagnosis not present

## 2015-10-20 ENCOUNTER — Telehealth: Payer: Self-pay | Admitting: Pulmonary Disease

## 2015-10-20 MED ORDER — TIOTROPIUM BROMIDE MONOHYDRATE 2.5 MCG/ACT IN AERS: 2.0000 | INHALATION_SPRAY | Freq: Every day | RESPIRATORY_TRACT | Status: DC

## 2015-10-20 MED ORDER — BUDESONIDE-FORMOTEROL FUMARATE 160-4.5 MCG/ACT IN AERO: INHALATION_SPRAY | RESPIRATORY_TRACT | Status: DC

## 2015-10-20 NOTE — Telephone Encounter (Signed)
Requested refills of Spiriva and Symbicort sent to [facility] 90-day supply Refills sent x 1 refill each. Nothing further needed.

## 2015-12-01 DIAGNOSIS — L821 Other seborrheic keratosis: Secondary | ICD-10-CM | POA: Diagnosis not present

## 2015-12-01 DIAGNOSIS — Z23 Encounter for immunization: Secondary | ICD-10-CM | POA: Diagnosis not present

## 2015-12-01 DIAGNOSIS — L814 Other melanin hyperpigmentation: Secondary | ICD-10-CM | POA: Diagnosis not present

## 2015-12-01 DIAGNOSIS — L309 Dermatitis, unspecified: Secondary | ICD-10-CM | POA: Diagnosis not present

## 2015-12-01 DIAGNOSIS — D225 Melanocytic nevi of trunk: Secondary | ICD-10-CM | POA: Diagnosis not present

## 2015-12-01 DIAGNOSIS — D1801 Hemangioma of skin and subcutaneous tissue: Secondary | ICD-10-CM | POA: Diagnosis not present

## 2015-12-01 DIAGNOSIS — L859 Epidermal thickening, unspecified: Secondary | ICD-10-CM | POA: Diagnosis not present

## 2015-12-01 DIAGNOSIS — Z85828 Personal history of other malignant neoplasm of skin: Secondary | ICD-10-CM | POA: Diagnosis not present

## 2015-12-28 DIAGNOSIS — E119 Type 2 diabetes mellitus without complications: Secondary | ICD-10-CM | POA: Diagnosis not present

## 2015-12-28 DIAGNOSIS — Z0000 Encounter for general adult medical examination without abnormal findings: Secondary | ICD-10-CM | POA: Diagnosis not present

## 2015-12-28 DIAGNOSIS — E785 Hyperlipidemia, unspecified: Secondary | ICD-10-CM | POA: Diagnosis not present

## 2015-12-28 DIAGNOSIS — I10 Essential (primary) hypertension: Secondary | ICD-10-CM | POA: Diagnosis not present

## 2015-12-28 DIAGNOSIS — Z1389 Encounter for screening for other disorder: Secondary | ICD-10-CM | POA: Diagnosis not present

## 2015-12-28 DIAGNOSIS — J449 Chronic obstructive pulmonary disease, unspecified: Secondary | ICD-10-CM | POA: Diagnosis not present

## 2015-12-28 DIAGNOSIS — M810 Age-related osteoporosis without current pathological fracture: Secondary | ICD-10-CM | POA: Diagnosis not present

## 2015-12-31 DIAGNOSIS — H2513 Age-related nuclear cataract, bilateral: Secondary | ICD-10-CM | POA: Diagnosis not present

## 2015-12-31 DIAGNOSIS — H43311 Vitreous membranes and strands, right eye: Secondary | ICD-10-CM | POA: Diagnosis not present

## 2015-12-31 DIAGNOSIS — E119 Type 2 diabetes mellitus without complications: Secondary | ICD-10-CM | POA: Diagnosis not present

## 2016-01-11 ENCOUNTER — Ambulatory Visit (INDEPENDENT_AMBULATORY_CARE_PROVIDER_SITE_OTHER): Payer: Medicare Other | Admitting: Pulmonary Disease

## 2016-01-11 ENCOUNTER — Encounter: Payer: Self-pay | Admitting: Pulmonary Disease

## 2016-01-11 DIAGNOSIS — J438 Other emphysema: Secondary | ICD-10-CM

## 2016-01-11 MED ORDER — ALBUTEROL SULFATE HFA 108 (90 BASE) MCG/ACT IN AERS: 2.0000 | INHALATION_SPRAY | Freq: Four times a day (QID) | RESPIRATORY_TRACT | Status: DC | PRN

## 2016-01-11 NOTE — Progress Notes (Signed)
Subjective:    Patient ID: Yolanda Morris, female    DOB: September 11, 1946, 70 y.o.   MRN: [id number]  Synopsis: Former patient of Dr. Gwenette Greet with COPD PFT's 2011:  FEV1 1.25 (54%), ratio 52, +airtrapping, DLCO 77% [name] 06/2012:  FEV1 1.00 (43%), ratio 51% Completed pulmonary rehab 2014.   HPI  Chief Complaint  Patient presents with  . Follow-up    pt c/o sob with exertion, occasional nonprod cough that pt sums up to weather changes.     Asheena has been OK since the last visit.  She remains on Spiriva and Symbicort.  She has not had any ER, or urgent doctor's visits for respiratory problems. She thinks that the Spiriva really helps with exercise and she feels better when climbing hills and exercising.  She notices that her oxygen level is better as well.   Past Medical History  Diagnosis Date  . Unspecified essential hypertension   . COPD (chronic obstructive pulmonary disease) ([place])   . DM (diabetes mellitus) ([place])   . Hyperlipidemia   . Osteoporosis   . Genital herpes   . Migraines      Review of Systems     Objective:   Physical Exam Filed Vitals:   01/11/16 0910  BP: 126/80  Pulse: 83  Height: 5\' 3"  (1.6 m)  Weight: 164 lb 9.6 oz (74.662 kg)  SpO2: 93%   RA  Gen: well appearing HENT: OP clear, TM's clear, neck supple PULM: CTA B, normal percussion CV: RRR, no mgr, trace edema GI: BS+, soft, nontender Derm: no cyanosis or rash Psyche: normal mood and affect        Assessment & Plan:  COPD (chronic obstructive pulmonary disease) This has been a stable interval for [name].  She has been exercising more which is encouraging.  Plan: Continue Spiriva and Symbicort Continue current regimen. F/U 6 months     Current outpatient prescriptions:  .  acetaminophen (TYLENOL) 325 MG tablet, Take 650 mg by mouth every 6 (six) hours as needed., Disp: , Rfl:  .  aspirin EC 81 MG EC tablet, Take 81 mg by mouth daily.  , Disp: , Rfl:  .  budesonide-formoterol  (SYMBICORT) 160-4.5 MCG/ACT inhaler, Use 2 puffs twice daily, Disp: 30.6 g, Rfl: 1 .  calcium carbonate (OS-CAL - DOSED IN MG OF ELEMENTAL CALCIUM) 1250 (500 Ca) MG tablet, Take 1 tablet by mouth daily with breakfast., Disp: , Rfl:  .  chlorpheniramine (CHLOR-TRIMETON) 4 MG tablet, Take 4 mg by mouth daily as needed for allergies., Disp: , Rfl:  .  Cholecalciferol (VITAMIN D3) 1000 UNITS CAPS, Take 1 capsule by mouth daily.  , Disp: , Rfl:  .  Cranberry 1000 MG CAPS, Take 1 capsule by mouth daily., Disp: , Rfl:  .  losartan (COZAAR) 100 MG tablet, Take 100 mg by mouth daily. , Disp: , Rfl:  .  Lysine 500 MG CAPS, Take 1 capsule by mouth daily.  , Disp: , Rfl:  .  Multiple Vitamins-Minerals (CENTRUM SILVER PO), Take 1 tablet by mouth daily.  , Disp: , Rfl:  .  omeprazole (PRILOSEC) 20 MG capsule, Take 20 mg by mouth daily., Disp: , Rfl:  .  simvastatin (ZOCOR) 40 MG tablet, Take 40 mg by mouth at bedtime.  , Disp: , Rfl:  .  Spacer/Aero-Holding Chambers (AEROCHAMBER Z-STAT PLUS) inhaler, Use as instructed, Disp: 1 each, Rfl: 0 .  Tiotropium Bromide Monohydrate 2.5 MCG/ACT AERS, Inhale 2 puffs into the lungs daily., Disp:  3 Inhaler, Rfl: 1 .  valACYclovir (VALTREX) 500 MG tablet, Take 500 mg by mouth 2 (two) times daily as needed.  , Disp: , Rfl:  .  Zoledronic Acid (RECLAST IV), Inject into the vein. Every 12 months, Disp: , Rfl:  .  albuterol (PROVENTIL HFA;VENTOLIN HFA) 108 (90 BASE) MCG/ACT inhaler, Inhale 2 puffs into the lungs every 6 (six) hours as needed. (Patient not taking: Reported on 01/11/2016), Disp: 3 Inhaler, Rfl: 1 .  [DISCONTINUED] levocetirizine (XYZAL) 5 MG tablet, Take 5 mg by mouth every evening.  , Disp: , Rfl:

## 2016-01-11 NOTE — Assessment & Plan Note (Signed)
This has been a stable interval for Yolanda Morris.  She has been exercising more which is encouraging.  Plan: Continue Spiriva and Symbicort Continue current regimen. F/U 6 months

## 2016-01-11 NOTE — Patient Instructions (Signed)
Continue taking Symbicort and Spiriva for now If you decide between now and the next visit you want to substitute Spiriva for a DuoNeb used 4 times a day, that is fine we can make that arrangement via phone Follow-up with me in 6 months or sooner if needed

## 2016-01-15 ENCOUNTER — Telehealth: Payer: Self-pay | Admitting: Pulmonary Disease

## 2016-01-15 MED ORDER — ALBUTEROL SULFATE HFA 108 (90 BASE) MCG/ACT IN AERS: 2.0000 | INHALATION_SPRAY | Freq: Four times a day (QID) | RESPIRATORY_TRACT | Status: DC | PRN

## 2016-01-15 NOTE — Telephone Encounter (Signed)
RX for proair has been sent into [facility]. Nothing further needed

## 2016-01-19 ENCOUNTER — Ambulatory Visit (INDEPENDENT_AMBULATORY_CARE_PROVIDER_SITE_OTHER): Payer: Medicare Other | Admitting: Acute Care

## 2016-01-19 ENCOUNTER — Encounter: Payer: Self-pay | Admitting: Acute Care

## 2016-01-19 VITALS — BP 134/86 | HR 90 | Temp 97.6°F | Ht 63.0 in | Wt 164.0 lb

## 2016-01-19 DIAGNOSIS — J209 Acute bronchitis, unspecified: Secondary | ICD-10-CM | POA: Diagnosis not present

## 2016-01-19 DIAGNOSIS — J438 Other emphysema: Secondary | ICD-10-CM | POA: Diagnosis not present

## 2016-01-19 MED ORDER — PREDNISONE 10 MG PO TABS: ORAL_TABLET | ORAL | Status: DC

## 2016-01-19 MED ORDER — AMOXICILLIN-POT CLAVULANATE 875-125 MG PO TABS: 1.0000 | ORAL_TABLET | Freq: Two times a day (BID) | ORAL | Status: DC

## 2016-01-19 MED ORDER — HYDROCODONE-HOMATROPINE 5-1.5 MG/5ML PO SYRP: 5.0000 mL | ORAL_SOLUTION | Freq: Four times a day (QID) | ORAL | Status: DC | PRN

## 2016-01-19 NOTE — Assessment & Plan Note (Signed)
Acute Bronchitis Sputum color change to brown Denies fever/ chest pain. Plan:  We will call in Augmentin for your bronchitis. Activia Yogurt with probiotic while on antibiotics Prednisone taper; 10 mg tablets: 4 tabs x 2 days, 3 tabs x 2 days, 2 tabs x 2 days 1 tab x 2 days then stop. Continue using Mucinex Hydromet cough syrup 1 teaspoon at night for sleep. Do not drive if sleepy. Sips of water to sooth cough Avoid mint and menthol in cough lozenges. Follow up as needed if you do not improve call the office to be seen. Please contact office for sooner follow up if symptoms do not improve or worsen or seek emergency care

## 2016-01-19 NOTE — Progress Notes (Signed)
Subjective:    Patient ID: Yolanda Morris, female    DOB: [birth year], 70 y.o.   MRN: [id number]  HPI  70 year old female With known COPD currently maintained on Spiriva and Symbicort.Previous [facility] patient currently being followed by Dr. Lake Bells.  Synopsis: Former patient of Dr. Gwenette Greet with COPD currently maintained on Spiriva and Symbicort. PFT's 2011: FEV1 1.25 (54%), ratio 52, +airtrapping, DLCO 77% [name] 06/2012: FEV1 1.00 (43%), ratio 51% Completed pulmonary rehab 2014.   01/19/2016: Acute office visit: Patient presents to the office today with a one-week history of cough, chest congestion and tightness, and sputum color change to dark brown. She states that she gets acute bronchitis approximately 2 times yearly. She denies fever, orthopnea, hemoptysis, chest pain or leg pain. No recent airline or auto travel.   Current outpatient prescriptions:  .  acetaminophen (TYLENOL) 325 MG tablet, Take 650 mg by mouth every 6 (six) hours as needed., Disp: , Rfl:  .  albuterol (PROAIR HFA) 108 (90 Base) MCG/ACT inhaler, Inhale 2 puffs into the lungs every 6 (six) hours as needed for wheezing or shortness of breath., Disp: 3 Inhaler, Rfl: 1 .  aspirin EC 81 MG EC tablet, Take 81 mg by mouth daily.  , Disp: , Rfl:  .  budesonide-formoterol (SYMBICORT) 160-4.5 MCG/ACT inhaler, Use 2 puffs twice daily, Disp: 30.6 g, Rfl: 1 .  calcium carbonate (OS-CAL - DOSED IN MG OF ELEMENTAL CALCIUM) 1250 (500 Ca) MG tablet, Take 1 tablet by mouth daily with breakfast., Disp: , Rfl:  .  chlorpheniramine (CHLOR-TRIMETON) 4 MG tablet, Take 4 mg by mouth daily as needed for allergies., Disp: , Rfl:  .  Cholecalciferol (VITAMIN D3) 1000 UNITS CAPS, Take 1 capsule by mouth daily.  , Disp: , Rfl:  .  Cranberry 1000 MG CAPS, Take 1 capsule by mouth daily. Pt takes 500 mg, Disp: , Rfl:  .  losartan (COZAAR) 100 MG tablet, Take 100 mg by mouth daily. , Disp: , Rfl:  .  Lysine 500 MG CAPS, Take 1 capsule by mouth  daily.  , Disp: , Rfl:  .  Multiple Vitamins-Minerals (CENTRUM SILVER PO), Take 1 tablet by mouth daily.  , Disp: , Rfl:  .  omeprazole (PRILOSEC) 20 MG capsule, Take 20 mg by mouth daily., Disp: , Rfl:  .  simvastatin (ZOCOR) 40 MG tablet, Take 40 mg by mouth at bedtime.  , Disp: , Rfl:  .  Spacer/Aero-Holding Chambers (AEROCHAMBER Z-STAT PLUS) inhaler, Use as instructed, Disp: 1 each, Rfl: 0 .  Tiotropium Bromide Monohydrate 2.5 MCG/ACT AERS, Inhale 2 puffs into the lungs daily., Disp: 3 Inhaler, Rfl: 1 .  valACYclovir (VALTREX) 500 MG tablet, Take 500 mg by mouth 2 (two) times daily as needed.  , Disp: , Rfl:  .  Zoledronic Acid (RECLAST IV), Inject into the vein. Every 12 months, Disp: , Rfl:  .  amoxicillin-clavulanate (AUGMENTIN) 875-125 MG tablet, Take 1 tablet by mouth 2 (two) times daily., Disp: 14 tablet, Rfl: 0 .  HYDROcodone-homatropine (HYCODAN) 5-1.5 MG/5ML syrup, Take 5 mLs by mouth every 6 (six) hours as needed for cough., Disp: 240 mL, Rfl: 0 .  predniSONE (DELTASONE) 10 MG tablet, Take 4 tabs with food x 2 days, 3 tabs x 2 days, 2 tabs x 2 days, 1 tab x 2 days, then STOP, Disp: 20 tablet, Rfl: 0 .  [DISCONTINUED] levocetirizine (XYZAL) 5 MG tablet, Take 5 mg by mouth every evening.  , Disp: , Rfl:  Past Medical History  Diagnosis Date  . Unspecified essential hypertension   . COPD (chronic obstructive pulmonary disease) ([place])   . DM (diabetes mellitus) ([place])   . Hyperlipidemia   . Osteoporosis   . Genital herpes   . Migraines     Allergies  Allergen Reactions  . Bacitracin     rash  . Levofloxacin     hives    Review of Systems Constitutional:   No  weight loss, night sweats,  Fevers, chills, fatigue, or  lassitude.  HEENT:   No headaches,  Difficulty swallowing,  Tooth/dental problems, or  Sore throat,                No sneezing, itching, ear ache, nasal congestion, post nasal drip,   CV:  No chest pain,  Orthopnea, PND, swelling in lower extremities,  anasarca, dizziness, palpitations, syncope.   GI  No heartburn, indigestion, abdominal pain, nausea, vomiting, diarrhea, change in bowel habits, loss of appetite, bloody stools.   Resp: No shortness of breath with exertion or at rest.  + excess mucus, + productive cough,  No non-productive cough,  No coughing up of blood.  + change in color of mucus.  No wheezing.  No chest wall deformity  Skin: no rash or lesions.  GU: no dysuria, change in color of urine, no urgency or frequency.  No flank pain, no hematuria   MS:  No joint pain or swelling.  No decreased range of motion.  No back pain.  Psych:  No change in mood or affect. No depression or anxiety.  No memory loss.        Objective:   Physical Exam  BP 134/86 mmHg  Pulse 90  Temp(Src) 97.6 F (36.4 C) (Oral)  Ht 5\' 3"  (1.6 m)  Wt 164 lb (74.39 kg)  BMI 29.06 kg/m2  SpO2 92%   Physical Exam:  General- No distress,  A&Ox3 ENT: No sinus tenderness, TM clear, pale nasal mucosa, no oral exudate,no post nasal drip, no LAN Cardiac: S1, S2, regular rate and rhythm, no murmur Chest: + wheeze/ no rales/ dullness; no accessory muscle use, no nasal flaring, no sternal retractions Abd.: Soft Non-tender Ext: No clubbing cyanosis, edema Neuro:  normal strength Skin: No rashes, warm and dry Psych: normal mood and behavior  Magdalen Spatz, AGACNP-BC [place] Pager # [phone] 01/19/2016     Assessment & Plan:

## 2016-01-19 NOTE — Assessment & Plan Note (Signed)
COPD Flare 2/2/ Bronchitis Plan: Prednisone taper; 10 mg tablets: 4 tabs x 2 days, 3 tabs x 2 days, 2 tabs x 2 days 1 tab x 2 days then stop. Continue Spiriva and Symbicort as maintenance. Continue Pro Air as rescue inhaler Please contact office for sooner follow up if symptoms do not improve or worsen or seek emergency care

## 2016-01-19 NOTE — Progress Notes (Signed)
Reviewed, agree 

## 2016-01-19 NOTE — Patient Instructions (Addendum)
It is nice to meet you today. We will call in Augmentin for your bronchitis. Activia Yogurt with probiotic while on antibiotics Prednisone taper; 10 mg tablets: 4 tabs x 2 days, 3 tabs x 2 days, 2 tabs x 2 days 1 tab x 2 days then stop. Continue using Mucinex Hydromet cough syrup 1 teaspoon at night for sleep. Do not drive if sleepy. Sips of water to sooth cough Avoid mint and menthol in cough lozenges. Follow up as needed if you do not improve call the office to be seen. Please contact office for sooner follow up if symptoms do not improve or worsen or seek emergency care

## 2016-01-20 ENCOUNTER — Telehealth: Payer: Self-pay | Admitting: Acute Care

## 2016-01-20 NOTE — Telephone Encounter (Signed)
Please call Yolanda Morris and make sure she has a Careers information officer for her [employer]. This is a med change due to her insurance and I want to make sure she has received the RX. Thanks so much.

## 2016-01-22 NOTE — Telephone Encounter (Signed)
Left message for patient to call back  

## 2016-01-22 NOTE — Telephone Encounter (Signed)
Patient returning call, CB [phone].  Patient states [facility] got the order from Dr. Lake Bells and states the medicine is on it's way. States she does not need a call back.

## 2016-02-04 ENCOUNTER — Other Ambulatory Visit: Payer: Self-pay | Admitting: Family Medicine

## 2016-02-04 ENCOUNTER — Other Ambulatory Visit: Payer: Self-pay

## 2016-02-04 DIAGNOSIS — R921 Mammographic calcification found on diagnostic imaging of breast: Secondary | ICD-10-CM

## 2016-02-29 ENCOUNTER — Ambulatory Visit
Admission: RE | Admit: 2016-02-29 | Discharge: 2016-02-29 | Disposition: A | Discharge status: Home / Self Care | Payer: Medicare Other | Source: Ambulatory Visit | Attending: Family Medicine | Admitting: Family Medicine

## 2016-02-29 DIAGNOSIS — R921 Mammographic calcification found on diagnostic imaging of breast: Secondary | ICD-10-CM | POA: Diagnosis not present

## 2016-03-16 ENCOUNTER — Telehealth: Payer: Self-pay | Admitting: Pulmonary Disease

## 2016-03-16 DIAGNOSIS — J438 Other emphysema: Secondary | ICD-10-CM

## 2016-03-16 MED ORDER — IPRATROPIUM-ALBUTEROL 0.5-2.5 (3) MG/3ML IN SOLN: 3.0000 mL | Freq: Four times a day (QID) | RESPIRATORY_TRACT | Status: DC

## 2016-03-16 NOTE — Telephone Encounter (Signed)
Patient Instructions     Continue taking Symbicort and Spiriva for now If you decide between now and the next visit you want to substitute Spiriva for a DuoNeb used 4 times a day, that is fine we can make that arrangement via phone Follow-up with me in 6 months or sooner if needed   Spoke with pt. She wants to go ahead and start Duoneb. Advised her that I would place an order for a neb machine and medication. Rx for Duoneb will be printed for BQ to sign. Nothing further was needed.

## 2016-03-22 ENCOUNTER — Telehealth: Payer: Self-pay | Admitting: Pulmonary Disease

## 2016-03-22 DIAGNOSIS — J438 Other emphysema: Secondary | ICD-10-CM

## 2016-03-22 MED ORDER — IPRATROPIUM-ALBUTEROL 0.5-2.5 (3) MG/3ML IN SOLN: 3.0000 mL | Freq: Four times a day (QID) | RESPIRATORY_TRACT | Status: DC

## 2016-03-22 NOTE — Telephone Encounter (Signed)
Patient calling to get DuoNeb sent to [facility] instead of [facility]. Rx sent to [facility]. Patient aware. Nothing further needed.    ipratropium-albuterol (DUONEB) 0.5-2.5 (3) MG/3ML SOLN 360 mL 5 03/22/2016      Sig - Route: Take 3 mLs by nebulization 4 (four) times daily. Dx: J44.9 - Nebulization     E-Prescribing Status: Receipt confirmed by pharmacy (03/22/2016 4:01 PM EDT)

## 2016-03-25 ENCOUNTER — Telehealth: Payer: Self-pay | Admitting: Pulmonary Disease

## 2016-03-25 DIAGNOSIS — J438 Other emphysema: Secondary | ICD-10-CM

## 2016-03-25 MED ORDER — IPRATROPIUM-ALBUTEROL 0.5-2.5 (3) MG/3ML IN SOLN: 3.0000 mL | Freq: Four times a day (QID) | RESPIRATORY_TRACT | Status: DC

## 2016-03-25 NOTE — Telephone Encounter (Signed)
Spoke with pt. She needs a prescription of Duoneb sent in to her local pharmacy. This has been taken care of. Nothing further was needed.

## 2016-03-31 DIAGNOSIS — Z1159 Encounter for screening for other viral diseases: Secondary | ICD-10-CM | POA: Diagnosis not present

## 2016-03-31 DIAGNOSIS — E119 Type 2 diabetes mellitus without complications: Secondary | ICD-10-CM | POA: Diagnosis not present

## 2016-04-01 DIAGNOSIS — Z8781 Personal history of (healed) traumatic fracture: Secondary | ICD-10-CM | POA: Diagnosis not present

## 2016-04-01 DIAGNOSIS — M810 Age-related osteoporosis without current pathological fracture: Secondary | ICD-10-CM | POA: Diagnosis not present

## 2016-04-11 ENCOUNTER — Telehealth: Payer: Self-pay | Admitting: Pulmonary Disease

## 2016-04-11 MED ORDER — BUDESONIDE-FORMOTEROL FUMARATE 160-4.5 MCG/ACT IN AERO: INHALATION_SPRAY | RESPIRATORY_TRACT | Status: DC

## 2016-04-11 NOTE — Telephone Encounter (Signed)
Spoke with pt. She needs a 90 day supply refill on Symbicort. This has been sent in to [facility]. Nothing further was needed.

## 2016-04-27 ENCOUNTER — Ambulatory Visit (HOSPITAL_COMMUNITY)
Admission: RE | Admit: 2016-04-27 | Discharge: 2016-04-27 | Disposition: A | Discharge status: Home / Self Care | Payer: Medicare Other | Source: Ambulatory Visit | Attending: Internal Medicine | Admitting: Internal Medicine

## 2016-04-27 ENCOUNTER — Encounter (HOSPITAL_COMMUNITY): Payer: Self-pay

## 2016-04-27 DIAGNOSIS — M810 Age-related osteoporosis without current pathological fracture: Secondary | ICD-10-CM | POA: Diagnosis not present

## 2016-04-27 MED ORDER — ZOLEDRONIC ACID 5 MG/100ML IV SOLN
5.0000 mg | Freq: Once | INTRAVENOUS | Status: AC
Start: 2016-04-27 — End: 2016-04-27
  Administered 2016-04-27: 5 mg via INTRAVENOUS
  Filled 2016-04-27: qty 100

## 2016-04-27 MED ORDER — SODIUM CHLORIDE 0.9 % IV SOLN
Freq: Once | INTRAVENOUS | Status: AC
  Administered 2016-04-27: 10:00:00 via INTRAVENOUS

## 2016-04-27 NOTE — Discharge Instructions (Signed)

## 2016-05-24 DIAGNOSIS — K529 Noninfective gastroenteritis and colitis, unspecified: Secondary | ICD-10-CM | POA: Diagnosis not present

## 2016-07-07 ENCOUNTER — Other Ambulatory Visit: Payer: Self-pay | Admitting: Pulmonary Disease

## 2016-07-11 ENCOUNTER — Encounter: Payer: Self-pay | Admitting: Pulmonary Disease

## 2016-07-11 ENCOUNTER — Ambulatory Visit (INDEPENDENT_AMBULATORY_CARE_PROVIDER_SITE_OTHER): Payer: Medicare Other | Admitting: Pulmonary Disease

## 2016-07-11 DIAGNOSIS — J410 Simple chronic bronchitis: Secondary | ICD-10-CM | POA: Diagnosis not present

## 2016-07-11 DIAGNOSIS — Z23 Encounter for immunization: Secondary | ICD-10-CM

## 2016-07-11 NOTE — Assessment & Plan Note (Signed)
She had one exacerbation this year but otherwise it has been a stable interval for her with her severe COPD.  We spent a significant amount of time talking about medication costs today. She would like to try to consolidate her medicines is much as possible. Currently on hand she's doing fairly well with DuoNeb 3 times a day to save money on Spiriva and then using Symbicort twice a day. I advised that we could consider switching to nebulized long-acting beta agonist and inhaled corticosteroid or consolidating to a combination long acting beta agonist and long-acting anti-muscarinic drug.  Plan: Flu shot today Continue Symbicort and DuoNeb 3 times a day for now When she gets to the end of her Symbicort supply she's going to call and weak and give her a sample of something like Stiolto or Anoro to try Follow-up 6 months

## 2016-07-11 NOTE — Progress Notes (Signed)
Subjective:    Patient ID: Yolanda Morris, female    DOB: 1945/11/12, 70 y.o.   MRN: [id number]  Synopsis: Former patient of Dr. Gwenette Greet with COPD PFT's 2011:  FEV1 1.25 (54%), ratio 52, +airtrapping, DLCO 77% [name] 06/2012:  FEV1 1.00 (43%), ratio 51% Completed pulmonary rehab 2014.   HPI  Chief Complaint  Patient presents with  . Follow-up    Pt reports feels breathing is okay. Has very little wheezing.    Daureen has not been taking her Spiriva.  She is taking duoneb three times per day.   She is trying to exercise more but she hasn't been out walking as much as she wanted.  She says that her blood sugar has been elevated.   She has experienced some back pain.  She took prednisone and an antibiotic in April due to a COPD flare and now feels better. Her breathing has been OK with the duoneb instead of Spiriva.    Past Medical History:  Diagnosis Date  . COPD (chronic obstructive pulmonary disease) ([place])   . DM (diabetes mellitus) ([place])   . Genital herpes   . Hyperlipidemia   . Migraines   . Osteoporosis   . Unspecified essential hypertension      Review of Systems     Objective:   Physical Exam Vitals:   07/11/16 0910  BP: 138/80  Pulse: 87  SpO2: 94%  Weight: 73.6 kg (162 lb 3.2 oz)  Height: 5\' 3"  (1.6 m)   RA  Gen: well appearing HENT: OP clear, neck supple PULM: CTA B poor air movement but no wheezing, normal percussion CV: RRR, no mgr, trace edema GI: BS+, soft, nontender Derm: no cyanosis or rash Psyche: normal mood and affect        Assessment & Plan:  COPD (chronic obstructive pulmonary disease) ([place]) She had one exacerbation this year but otherwise it has been a stable interval for her with her severe COPD.  We spent a significant amount of time talking about medication costs today. She would like to try to consolidate her medicines is much as possible. Currently on hand she's doing fairly well with DuoNeb 3 times a day to save money on  Spiriva and then using Symbicort twice a day. I advised that we could consider switching to nebulized long-acting beta agonist and inhaled corticosteroid or consolidating to a combination long acting beta agonist and long-acting anti-muscarinic drug.  Plan: Flu shot today Continue Symbicort and DuoNeb 3 times a day for now When she gets to the end of her Symbicort supply she's going to call and weak and give her a sample of something like Stiolto or Anoro to try Follow-up 6 months    Current Outpatient Prescriptions:  .  acetaminophen (TYLENOL) 325 MG tablet, Take 650 mg by mouth every 6 (six) hours as needed., Disp: , Rfl:  .  albuterol (PROAIR HFA) 108 (90 Base) MCG/ACT inhaler, Inhale 2 puffs into the lungs every 6 (six) hours as needed for wheezing or shortness of breath., Disp: 3 Inhaler, Rfl: 1 .  aspirin EC 81 MG EC tablet, Take 81 mg by mouth daily.  , Disp: , Rfl:  .  calcium carbonate (OS-CAL - DOSED IN MG OF ELEMENTAL CALCIUM) 1250 (500 Ca) MG tablet, Take 1 tablet by mouth daily with breakfast., Disp: , Rfl:  .  chlorpheniramine (CHLOR-TRIMETON) 4 MG tablet, Take 4 mg by mouth daily as needed for allergies., Disp: , Rfl:  .  Cholecalciferol (VITAMIN D3)  1000 UNITS CAPS, Take 1 capsule by mouth daily.  , Disp: , Rfl:  .  Cranberry 500 MG TABS, Take 1 tablet by mouth daily., Disp: , Rfl:  .  ipratropium-albuterol (DUONEB) 0.5-2.5 (3) MG/3ML SOLN, Take 3 mLs by nebulization 4 (four) times daily. Dx: J44.9, Disp: 360 mL, Rfl: 5 .  losartan (COZAAR) 100 MG tablet, Take 100 mg by mouth daily. , Disp: , Rfl:  .  Lysine 500 MG CAPS, Take 1 capsule by mouth daily.  , Disp: , Rfl:  .  Multiple Vitamins-Minerals (CENTRUM SILVER PO), Take 1 tablet by mouth daily.  , Disp: , Rfl:  .  omeprazole (PRILOSEC) 20 MG capsule, Take 20 mg by mouth daily., Disp: , Rfl:  .  simvastatin (ZOCOR) 40 MG tablet, Take 40 mg by mouth at bedtime.  , Disp: , Rfl:  .  Spacer/Aero-Holding Chambers (AEROCHAMBER  Z-STAT PLUS) inhaler, Use as instructed, Disp: 1 each, Rfl: 0 .  SYMBICORT 160-4.5 MCG/ACT inhaler, Use 2 puffs twice daily, Disp: 3 Inhaler, Rfl: 1 .  valACYclovir (VALTREX) 500 MG tablet, Take 500 mg by mouth 2 (two) times daily as needed.  , Disp: , Rfl:  .  Zoledronic Acid (RECLAST IV), Inject into the vein. Every 12 months, Disp: , Rfl:

## 2016-07-11 NOTE — Patient Instructions (Signed)
Keep taking her medicines as your doing When you get to the end of year Symbicort supply, call us and leaking give a sample of Stiolto or Anoro to try Keep using DuoNeb 3 times a day for now, 4 times a day is okay as well We will see you back in 6 months or sooner if needed

## 2016-08-01 DIAGNOSIS — E119 Type 2 diabetes mellitus without complications: Secondary | ICD-10-CM | POA: Diagnosis not present

## 2016-08-01 DIAGNOSIS — E785 Hyperlipidemia, unspecified: Secondary | ICD-10-CM | POA: Diagnosis not present

## 2016-08-01 DIAGNOSIS — I10 Essential (primary) hypertension: Secondary | ICD-10-CM | POA: Diagnosis not present

## 2016-09-06 ENCOUNTER — Telehealth: Payer: Self-pay | Admitting: Pulmonary Disease

## 2016-09-06 NOTE — Telephone Encounter (Signed)
Called and spoke with pt and she stated that she has the spiriva that will last for about 1 month.  She is requesting to go back to the spiriva for the month of December, since this will be a very busy month, she will not be able to take her nebulizer with her all the time and it will be easier to use the spiriva instead of the neb.  BQ please advise if you are ok with doing this for the pt.   Allergies  Allergen Reactions  . Bacitracin     rash  . Levofloxacin     hives

## 2016-09-07 NOTE — Telephone Encounter (Signed)
OK by me.  OK to send Rx

## 2016-09-07 NOTE — Telephone Encounter (Signed)
Pt aware of below message. Pt states she does not need a Rx sent in as she already has a refill. Pt voiced understanding and had no further questions. Nothing further needed.

## 2016-09-21 ENCOUNTER — Telehealth: Payer: Self-pay | Admitting: Pulmonary Disease

## 2016-09-21 NOTE — Telephone Encounter (Signed)
LMTCB

## 2016-09-22 MED ORDER — UMECLIDINIUM-VILANTEROL 62.5-25 MCG/INH IN AEPB: 1.0000 | INHALATION_SPRAY | Freq: Every day | RESPIRATORY_TRACT | 0 refills | Status: DC

## 2016-09-22 NOTE — Telephone Encounter (Signed)
  Assessment & Plan:  COPD (chronic obstructive pulmonary disease) ([place]) She had one exacerbation this year but otherwise it has been a stable interval for her with her severe COPD.  We spent a significant amount of time talking about medication costs today. She would like to try to consolidate her medicines is much as possible. Currently on hand she's doing fairly well with DuoNeb 3 times a day to save money on Spiriva and then using Symbicort twice a day. I advised that we could consider switching to nebulized long-acting beta agonist and inhaled corticosteroid or consolidating to a combination long acting beta agonist and long-acting anti-muscarinic drug.  Plan: Flu shot today Continue Symbicort and DuoNeb 3 times a day for now When she gets to the end of her Symbicort supply she's going to call and weak and give her a sample of something like Stiolto or Anoro to try Follow-up 6 months    Pt states she will finish her inhaler at the end of this month-she would like to come by and pick up Anoro sample prior to that so she can try it at the first of the year. Sample has been placed up front with note for Triage to teach patient how to use inhaler when she picks up sample. Nothing more needed at this time.

## 2016-10-20 ENCOUNTER — Telehealth: Payer: Self-pay | Admitting: Pulmonary Disease

## 2016-10-20 NOTE — Telephone Encounter (Signed)
Called and spoke to pt. Pt states she feels she is doing very well on the Anoro and has noticed an improvement in her breathing since starting it a few days ago. Pt is requesting an rx sent to [facility] (90 day supply), because this has not been filled by pulmonary nor does it states to fill if pt tolerates it, will need to get approval from BQ. Pt also states she has not been taking the Duoneb regularly, only as needed, and is questioning if she needs to take it regularly since she is doing so well without it. Two samples of Anoro have been left up front for pick up.   Dr. Lake Bells please advise if ok to send in Anoro rx and if ok to just use Duoneb prn. Thanks.

## 2016-10-21 MED ORDER — UMECLIDINIUM-VILANTEROL 62.5-25 MCG/INH IN AEPB: 1.0000 | INHALATION_SPRAY | Freq: Every day | RESPIRATORY_TRACT | 1 refills | Status: DC

## 2016-10-21 NOTE — Telephone Encounter (Signed)
Pt returning call to speak to nurse reguard yesterdays refill request she can be reached @ [phone].[name]

## 2016-10-21 NOTE — Telephone Encounter (Signed)
Rx has been sent in for 90 day supply of Anoro.

## 2016-10-21 NOTE — Telephone Encounter (Signed)
Spoke with pt. And informed her that the message was sent to BQ, and that he is not in clinic this week but the nurse yesterday placed two samples up front for her in in the meantime while we await his response. She will try to come today to pick up her samples. Nothing further is needed at this time, will await BQ's response

## 2016-10-21 NOTE — Telephone Encounter (Signed)
LM with pt's husband to have pt return our call.

## 2016-10-21 NOTE — Telephone Encounter (Signed)
Pt returning call and she knows not to [place] any other inhalers w/the anoro and also that the duoneb is only to be used prn and that we are to send in a 90 day supply of Anoro to her mail in pharm.[name]

## 2016-10-21 NOTE — Telephone Encounter (Signed)
Great, OK to send Anoro 90 day Rx to her mail-in pharmacy.  Make sure she knows that duoneb is only prn and she should not take any other maintenance inhalers (symbicort, dulera, etc) with the Anoro.

## 2016-12-05 DIAGNOSIS — Z85828 Personal history of other malignant neoplasm of skin: Secondary | ICD-10-CM | POA: Diagnosis not present

## 2016-12-05 DIAGNOSIS — Z23 Encounter for immunization: Secondary | ICD-10-CM | POA: Diagnosis not present

## 2016-12-05 DIAGNOSIS — L859 Epidermal thickening, unspecified: Secondary | ICD-10-CM | POA: Diagnosis not present

## 2016-12-05 DIAGNOSIS — L309 Dermatitis, unspecified: Secondary | ICD-10-CM | POA: Diagnosis not present

## 2016-12-05 DIAGNOSIS — L821 Other seborrheic keratosis: Secondary | ICD-10-CM | POA: Diagnosis not present

## 2016-12-05 DIAGNOSIS — D1801 Hemangioma of skin and subcutaneous tissue: Secondary | ICD-10-CM | POA: Diagnosis not present

## 2016-12-05 DIAGNOSIS — D225 Melanocytic nevi of trunk: Secondary | ICD-10-CM | POA: Diagnosis not present

## 2016-12-05 DIAGNOSIS — L814 Other melanin hyperpigmentation: Secondary | ICD-10-CM | POA: Diagnosis not present

## 2016-12-29 DIAGNOSIS — B009 Herpesviral infection, unspecified: Secondary | ICD-10-CM | POA: Diagnosis not present

## 2016-12-29 DIAGNOSIS — I10 Essential (primary) hypertension: Secondary | ICD-10-CM | POA: Diagnosis not present

## 2016-12-29 DIAGNOSIS — E119 Type 2 diabetes mellitus without complications: Secondary | ICD-10-CM | POA: Diagnosis not present

## 2016-12-29 DIAGNOSIS — E785 Hyperlipidemia, unspecified: Secondary | ICD-10-CM | POA: Diagnosis not present

## 2016-12-29 DIAGNOSIS — J449 Chronic obstructive pulmonary disease, unspecified: Secondary | ICD-10-CM | POA: Diagnosis not present

## 2016-12-29 DIAGNOSIS — R5383 Other fatigue: Secondary | ICD-10-CM | POA: Diagnosis not present

## 2016-12-29 DIAGNOSIS — Z1389 Encounter for screening for other disorder: Secondary | ICD-10-CM | POA: Diagnosis not present

## 2016-12-29 DIAGNOSIS — Z0000 Encounter for general adult medical examination without abnormal findings: Secondary | ICD-10-CM | POA: Diagnosis not present

## 2016-12-29 DIAGNOSIS — M810 Age-related osteoporosis without current pathological fracture: Secondary | ICD-10-CM | POA: Diagnosis not present

## 2017-01-11 ENCOUNTER — Ambulatory Visit (INDEPENDENT_AMBULATORY_CARE_PROVIDER_SITE_OTHER): Payer: Medicare Other | Admitting: Pulmonary Disease

## 2017-01-11 ENCOUNTER — Encounter: Payer: Self-pay | Admitting: Pulmonary Disease

## 2017-01-11 VITALS — BP 118/66 | HR 75 | Ht 63.0 in | Wt 162.0 lb

## 2017-01-11 DIAGNOSIS — J432 Centrilobular emphysema: Secondary | ICD-10-CM

## 2017-01-11 MED ORDER — UMECLIDINIUM-VILANTEROL 62.5-25 MCG/INH IN AEPB: 1.0000 | INHALATION_SPRAY | Freq: Every day | RESPIRATORY_TRACT | 3 refills | Status: DC

## 2017-01-11 NOTE — Progress Notes (Signed)
Subjective:    Patient ID: Yolanda Morris, female    DOB: 1946/06/02, 71 y.o.   MRN: [id number]  Synopsis: Former patient of Dr. Gwenette Greet with COPD PFT's 2011:  FEV1 1.25 (54%), ratio 52, +airtrapping, DLCO 77% [name] 06/2012:  FEV1 1.00 (43%), ratio 51% Completed pulmonary rehab 2014.   HPI  Chief Complaint  Patient presents with  . Follow-up    pt c/o occasional nonprod cough, sob worsened by weather.  doing well on Anoro   She says her breathing has been OK.  Sometimes on windy days she has dyspnea, but it hasn't been bad.  All in all she feel OK.  Sh eis feeling better with Anoro, particularly in the mornings.  She still uses the duoneb every now and then, but not regularly like before.    Apparently her Hgb A1c has been going up.    She is trying to exercise more, she sees her oxygen level drop sometimes to 89%.   Past Medical History:  Diagnosis Date  . COPD (chronic obstructive pulmonary disease) ([place])   . DM (diabetes mellitus) ([place])   . Genital herpes   . Hyperlipidemia   . Migraines   . Osteoporosis   . Unspecified essential hypertension      Review of Systems     Objective:   Physical Exam Vitals:   01/11/17 0909  BP: 118/66  Pulse: 75  SpO2: 95%  Weight: 162 lb (73.5 kg)  Height: 5\' 3"  (1.6 m)   RA  Gen: well appearing HENT: OP clear, TM's clear, neck supple PULM: CTA B, normal percussion CV: RRR, no mgr, trace edema GI: BS+, soft, nontender Derm: no cyanosis or rash Psyche: normal mood and affect         Assessment & Plan:  COPD (chronic obstructive pulmonary disease) ([place]) This has been a stable interval for her and she has been tolerating the Anoro well.  Plan: Continue Anoro Continue duoneb as needed f/u 6 months    Current Outpatient Prescriptions:  .  acetaminophen (TYLENOL) 325 MG tablet, Take 650 mg by mouth every 6 (six) hours as needed., Disp: , Rfl:  .  albuterol (PROAIR HFA) 108 (90 Base) MCG/ACT inhaler, Inhale 2  puffs into the lungs every 6 (six) hours as needed for wheezing or shortness of breath., Disp: 3 Inhaler, Rfl: 1 .  aspirin EC 81 MG EC tablet, Take 81 mg by mouth daily.  , Disp: , Rfl:  .  calcium carbonate (OS-CAL - DOSED IN MG OF ELEMENTAL CALCIUM) 1250 (500 Ca) MG tablet, Take 1 tablet by mouth daily with breakfast., Disp: , Rfl:  .  chlorpheniramine (CHLOR-TRIMETON) 4 MG tablet, Take 4 mg by mouth daily as needed for allergies., Disp: , Rfl:  .  Cholecalciferol (VITAMIN D3) 1000 UNITS CAPS, Take 1 capsule by mouth daily.  , Disp: , Rfl:  .  Cranberry 500 MG TABS, Take 1 tablet by mouth daily., Disp: , Rfl:  .  ipratropium-albuterol (DUONEB) 0.5-2.5 (3) MG/3ML SOLN, Take 3 mLs by nebulization 4 (four) times daily. Dx: J44.9, Disp: 360 mL, Rfl: 5 .  losartan (COZAAR) 100 MG tablet, Take 100 mg by mouth daily. , Disp: , Rfl:  .  Lysine 500 MG CAPS, Take 1 capsule by mouth daily.  , Disp: , Rfl:  .  Multiple Vitamins-Minerals (CENTRUM SILVER PO), Take 1 tablet by mouth daily.  , Disp: , Rfl:  .  omeprazole (PRILOSEC) 20 MG capsule, Take 20 mg by mouth  daily., Disp: , Rfl:  .  simvastatin (ZOCOR) 40 MG tablet, Take 40 mg by mouth at bedtime.  , Disp: , Rfl:  .  Spacer/Aero-Holding Chambers (AEROCHAMBER Z-STAT PLUS) inhaler, Use as instructed, Disp: 1 each, Rfl: 0 .  SYMBICORT 160-4.5 MCG/ACT inhaler, Use 2 puffs twice daily, Disp: 3 Inhaler, Rfl: 1 .  umeclidinium-vilanterol (ANORO ELLIPTA) 62.5-25 MCG/INH AEPB, Inhale 1 puff into the lungs daily., Disp: 180 each, Rfl: 1 .  valACYclovir (VALTREX) 500 MG tablet, Take 500 mg by mouth 2 (two) times daily as needed.  , Disp: , Rfl:  .  Zoledronic Acid (RECLAST IV), Inject into the vein. Every 12 months, Disp: , Rfl:

## 2017-01-11 NOTE — Patient Instructions (Signed)
For your COPD: Keep using Anoro daily Use the duoneb every 6 hours AS NEEDED for shortness of breath, not on a regular basis We will see you back in 6 months or sooner if needed

## 2017-01-11 NOTE — Assessment & Plan Note (Signed)
This has been a stable interval for her and she has been tolerating the Anoro well.  Plan: Continue Anoro Continue duoneb as needed f/u 6 months

## 2017-01-31 DIAGNOSIS — H5212 Myopia, left eye: Secondary | ICD-10-CM | POA: Diagnosis not present

## 2017-01-31 DIAGNOSIS — H5201 Hypermetropia, right eye: Secondary | ICD-10-CM | POA: Diagnosis not present

## 2017-01-31 DIAGNOSIS — H2513 Age-related nuclear cataract, bilateral: Secondary | ICD-10-CM | POA: Diagnosis not present

## 2017-01-31 DIAGNOSIS — E119 Type 2 diabetes mellitus without complications: Secondary | ICD-10-CM | POA: Diagnosis not present

## 2017-02-06 ENCOUNTER — Other Ambulatory Visit: Payer: Self-pay | Admitting: Family Medicine

## 2017-02-06 DIAGNOSIS — R921 Mammographic calcification found on diagnostic imaging of breast: Secondary | ICD-10-CM

## 2017-03-01 ENCOUNTER — Ambulatory Visit
Admission: RE | Admit: 2017-03-01 | Discharge: 2017-03-01 | Disposition: A | Discharge status: Home / Self Care | Payer: Medicare Other | Source: Ambulatory Visit | Attending: Family Medicine | Admitting: Family Medicine

## 2017-03-01 DIAGNOSIS — R921 Mammographic calcification found on diagnostic imaging of breast: Secondary | ICD-10-CM

## 2017-03-01 DIAGNOSIS — R920 Mammographic microcalcification found on diagnostic imaging of breast: Secondary | ICD-10-CM | POA: Diagnosis not present

## 2017-04-04 DIAGNOSIS — M810 Age-related osteoporosis without current pathological fracture: Secondary | ICD-10-CM | POA: Diagnosis not present

## 2017-04-04 DIAGNOSIS — Z8781 Personal history of (healed) traumatic fracture: Secondary | ICD-10-CM | POA: Diagnosis not present

## 2017-05-11 DIAGNOSIS — M810 Age-related osteoporosis without current pathological fracture: Secondary | ICD-10-CM | POA: Diagnosis not present

## 2017-05-16 MED ORDER — ZOLEDRONIC ACID 5 MG/100ML IV SOLN: 5.0000 mg | Freq: Once | INTRAVENOUS | Status: DC

## 2017-05-16 MED ORDER — SODIUM CHLORIDE 0.9 % IV SOLN
INTRAVENOUS | Status: AC
  Administered 2017-05-23: 10:00:00 via INTRAVENOUS

## 2017-05-23 ENCOUNTER — Encounter (HOSPITAL_COMMUNITY): Payer: Self-pay

## 2017-05-23 ENCOUNTER — Ambulatory Visit (HOSPITAL_COMMUNITY)
Admission: RE | Admit: 2017-05-23 | Discharge: 2017-05-23 | Disposition: A | Discharge status: Home / Self Care | Payer: Medicare Other | Source: Ambulatory Visit | Attending: Internal Medicine | Admitting: Internal Medicine

## 2017-05-23 DIAGNOSIS — M810 Age-related osteoporosis without current pathological fracture: Secondary | ICD-10-CM | POA: Insufficient documentation

## 2017-05-23 HISTORY — DX: Dyspnea, unspecified: R06.00

## 2017-05-23 MED ORDER — ZOLEDRONIC ACID 5 MG/100ML IV SOLN
5.0000 mg | Freq: Once | INTRAVENOUS | Status: AC
  Administered 2017-05-23: 5 mg via INTRAVENOUS
  Filled 2017-05-23: qty 100

## 2017-05-23 MED ORDER — ZOLEDRONIC ACID 5 MG/100ML IV SOLN: 5.0000 mg | Freq: Once | INTRAVENOUS | Status: DC

## 2017-05-23 MED ORDER — SODIUM CHLORIDE 0.9 % IV SOLN: INTRAVENOUS | Status: AC

## 2017-05-23 NOTE — Discharge Instructions (Signed)
Zoledronic Acid injection (Paget's Disease, Osteoporosis) / reclast infusion  °What is this medicine? °ZOLEDRONIC ACID (ZOE le dron ik AS id) lowers the amount of calcium loss from bone. It is used to treat Paget's disease and osteoporosis in women. °This medicine may be used for other purposes; ask your health care provider or pharmacist if you have questions. °COMMON BRAND NAME(S): Reclast, Zometa °What should I tell my health care provider before I take this medicine? °They need to know if you have any of these conditions: °-aspirin-sensitive asthma °-cancer, especially if you are receiving medicines used to treat cancer °-dental disease or wear dentures °-infection °-kidney disease °-low levels of calcium in the blood °-past surgery on the parathyroid gland or intestines °-receiving corticosteroids like dexamethasone or prednisone °-an unusual or allergic reaction to zoledronic acid, other medicines, foods, dyes, or preservatives °-pregnant or trying to get pregnant °-breast-feeding °How should I use this medicine? °This medicine is for infusion into a vein. It is given by a health care professional in a hospital or clinic setting. °Talk to your pediatrician regarding the use of this medicine in children. This medicine is not approved for use in children. °Overdosage: If you think you have taken too much of this medicine contact a poison control center or emergency room at once. °NOTE: This medicine is only for you. Do not share this medicine with others. °What if I miss a dose? °It is important not to miss your dose. Call your doctor or health care professional if you are unable to keep an appointment. °What may interact with this medicine? °-certain antibiotics given by injection °-NSAIDs, medicines for pain and inflammation, like ibuprofen or naproxen °-some diuretics like bumetanide, furosemide °-teriparatide °This list may not describe all possible interactions. Give your health care provider a list of all  the medicines, herbs, non-prescription drugs, or dietary supplements you use. Also tell them if you smoke, drink alcohol, or use illegal drugs. Some items may interact with your medicine. °What should I watch for while using this medicine? °Visit your doctor or health care professional for regular checkups. It may be some time before you see the benefit from this medicine. Do not stop taking your medicine unless your doctor tells you to. Your doctor may order blood tests or other tests to see how you are doing. °Women should inform their doctor if they wish to become pregnant or think they might be pregnant. There is a potential for serious side effects to an unborn child. Talk to your health care professional or pharmacist for more information. °You should make sure that you get enough calcium and vitamin D while you are taking this medicine. Discuss the foods you eat and the vitamins you take with your health care professional. °Some people who take this medicine have severe bone, joint, and/or muscle pain. This medicine may also increase your risk for jaw problems or a broken thigh bone. Tell your doctor right away if you have severe pain in your jaw, bones, joints, or muscles. Tell your doctor if you have any pain that does not go away or that gets worse. °Tell your dentist and dental surgeon that you are taking this medicine. You should not have major dental surgery while on this medicine. See your dentist to have a dental exam and fix any dental problems before starting this medicine. Take good care of your teeth while on this medicine. Make sure you see your dentist for regular follow-up appointments. °What side effects may I notice   from receiving this medicine? °Side effects that you should report to your doctor or health care professional as soon as possible: °-allergic reactions like skin rash, itching or hives, swelling of the face, lips, or tongue °-anxiety, confusion, or depression °-breathing  problems °-changes in vision °-eye pain °-feeling faint or lightheaded, falls °-jaw pain, especially after dental work °-mouth sores °-muscle cramps, stiffness, or weakness °-redness, blistering, peeling or loosening of the skin, including inside the mouth °-trouble passing urine or change in the amount of urine °Side effects that usually do not require medical attention (report to your doctor or health care professional if they continue or are bothersome): °-bone, joint, or muscle pain °-constipation °-diarrhea °-fever °-hair loss °-irritation at site where injected °-loss of appetite °-nausea, vomiting °-stomach upset °-trouble sleeping °-trouble swallowing °-weak or tired °This list may not describe all possible side effects. Call your doctor for medical advice about side effects. You may report side effects to FDA at 1-800-FDA-1088. °Where should I keep my medicine? °This drug is given in a hospital or clinic and will not be stored at home. °NOTE: This sheet is a summary. It may not cover all possible information. If you have questions about this medicine, talk to your doctor, pharmacist, or health care provider. °© 2018 [employer] (2014-03-01 14:19:57) ° °

## 2017-05-29 ENCOUNTER — Other Ambulatory Visit: Payer: Self-pay | Admitting: Family Medicine

## 2017-05-29 ENCOUNTER — Ambulatory Visit
Admission: RE | Admit: 2017-05-29 | Discharge: 2017-05-29 | Disposition: A | Discharge status: Home / Self Care | Payer: Medicare Other | Source: Ambulatory Visit | Attending: Family Medicine | Admitting: Family Medicine

## 2017-05-29 DIAGNOSIS — M79672 Pain in left foot: Secondary | ICD-10-CM | POA: Diagnosis not present

## 2017-05-29 DIAGNOSIS — E119 Type 2 diabetes mellitus without complications: Secondary | ICD-10-CM | POA: Diagnosis not present

## 2017-07-07 ENCOUNTER — Telehealth: Payer: Self-pay | Admitting: Pulmonary Disease

## 2017-07-07 NOTE — Telephone Encounter (Signed)
Attempted to contact pt. No answer, no option to leave a message. Will try back.  

## 2017-07-10 NOTE — Telephone Encounter (Signed)
lmtcb for pt.  

## 2017-07-11 MED ORDER — ALBUTEROL SULFATE HFA 108 (90 BASE) MCG/ACT IN AERS: 2.0000 | INHALATION_SPRAY | Freq: Four times a day (QID) | RESPIRATORY_TRACT | 3 refills | Status: DC | PRN

## 2017-07-11 MED ORDER — UMECLIDINIUM-VILANTEROL 62.5-25 MCG/INH IN AEPB
1.0000 | INHALATION_SPRAY | Freq: Every day | RESPIRATORY_TRACT | 3 refills | Status: DC
Start: 2017-07-11 — End: 2018-07-24

## 2017-07-11 NOTE — Telephone Encounter (Signed)
Called and spoke with pt and she is aware of refills that have been sent to the pharmacy.  Nothing further is needed.  

## 2017-07-12 ENCOUNTER — Ambulatory Visit (INDEPENDENT_AMBULATORY_CARE_PROVIDER_SITE_OTHER): Payer: Medicare Other | Admitting: Pulmonary Disease

## 2017-07-12 ENCOUNTER — Encounter: Payer: Self-pay | Admitting: Pulmonary Disease

## 2017-07-12 VITALS — BP 126/74 | HR 78 | Ht 63.0 in | Wt 155.2 lb

## 2017-07-12 DIAGNOSIS — Z23 Encounter for immunization: Secondary | ICD-10-CM

## 2017-07-12 DIAGNOSIS — J432 Centrilobular emphysema: Secondary | ICD-10-CM | POA: Diagnosis not present

## 2017-07-12 NOTE — Progress Notes (Signed)
Subjective:    Patient ID: Yolanda Morris, female    DOB: 11-Jun-1946, 71 y.o.   MRN: [id number]  Synopsis: Former patient of Dr. Gwenette Greet with COPD PFT's 2011:  FEV1 1.25 (54%), ratio 52, +airtrapping, DLCO 77% Yolanda Morris 06/2012:  FEV1 1.00 (43%), ratio 51% Completed pulmonary rehab 2014.   HPI  Chief Complaint  Patient presents with  . Follow-up    pt states she is doing well, does note some sob with exertion.    Ayva has been doing well.  She has not needed to use her nebulizer for rescue at all since the last visit.  She continues to have some dyspnea when she is active: vacuuming, on treadmill. Typically worse in the afternoons.    No bronchitis or pneumonia since the last visit.  Her husband was diagnosed with stage IV bladder cancer and he is about to go on hospice.    Past Medical History:  Diagnosis Date  . COPD (chronic obstructive pulmonary disease) ([place])   . DM (diabetes mellitus) ([place])   . Dyspnea    increased activity   . Genital herpes   . Hyperlipidemia   . Migraines   . Osteoporosis   . Unspecified essential hypertension      Review of Systems     Objective:   Physical Exam Vitals:   07/12/17 0906  BP: 126/74  Pulse: 78  SpO2: 94%  Weight: 155 lb 3.2 oz (70.4 kg)  Height: 5\' 3"  (1.6 m)   RA  Gen: well appearing HENT: OP clear, TM's clear, neck supple PULM: CTA B, normal percussion CV: RRR, no mgr, trace edema GI: BS+, soft, nontender Derm: no cyanosis or rash Psyche: normal mood and affect          Assessment & Plan:   Centrilobular emphysema (HCC)  Discussion: This has been a stable interval for Yolanda Morris despite her dealing with her husband's advanced cancer.    Plan: COPD: Continue Anoro Continue duoneb as needed for shortness of breath Flu shot today Stay active    Current Outpatient Prescriptions:  .  acetaminophen (TYLENOL) 325 MG tablet, Take 650 mg by mouth every 6 (six) hours as needed., Disp: , Rfl:  .   albuterol (PROAIR HFA) 108 (90 Base) MCG/ACT inhaler, Inhale 2 puffs into the lungs every 6 (six) hours as needed for wheezing or shortness of breath., Disp: 3 Inhaler, Rfl: 3 .  aspirin EC 81 MG EC tablet, Take 81 mg by mouth daily.  , Disp: , Rfl:  .  calcium carbonate (OS-CAL - DOSED IN MG OF ELEMENTAL CALCIUM) 1250 (500 Ca) MG tablet, Take 1 tablet by mouth daily with breakfast., Disp: , Rfl:  .  chlorpheniramine (CHLOR-TRIMETON) 4 MG tablet, Take 4 mg by mouth daily as needed for allergies., Disp: , Rfl:  .  Cholecalciferol (VITAMIN D3) 1000 UNITS CAPS, Take 1 capsule by mouth daily.  , Disp: , Rfl:  .  Cranberry 500 MG TABS, Take 1 tablet by mouth daily., Disp: , Rfl:  .  ipratropium-albuterol (DUONEB) 0.5-2.5 (3) MG/3ML SOLN, Take 3 mLs by nebulization 4 (four) times daily. Dx: J44.9, Disp: 360 mL, Rfl: 5 .  losartan (COZAAR) 100 MG tablet, Take 100 mg by mouth daily. , Disp: , Rfl:  .  Lysine 500 MG CAPS, Take 1 capsule by mouth daily.  , Disp: , Rfl:  .  Multiple Vitamins-Minerals (CENTRUM SILVER PO), Take 1 tablet by mouth daily.  , Disp: , Rfl:  .  omeprazole (  PRILOSEC) 20 MG capsule, Take 20 mg by mouth daily., Disp: , Rfl:  .  simvastatin (ZOCOR) 40 MG tablet, Take 40 mg by mouth at bedtime.  , Disp: , Rfl:  .  Spacer/Aero-Holding Chambers (AEROCHAMBER Z-STAT PLUS) inhaler, Use as instructed, Disp: 1 each, Rfl: 0 .  SYMBICORT 160-4.5 MCG/ACT inhaler, Use 2 puffs twice daily, Disp: 3 Inhaler, Rfl: 1 .  umeclidinium-vilanterol (ANORO ELLIPTA) 62.5-25 MCG/INH AEPB, Inhale 1 puff into the lungs daily., Disp: 180 each, Rfl: 3 .  valACYclovir (VALTREX) 500 MG tablet, Take 500 mg by mouth 2 (two) times daily as needed.  , Disp: , Rfl:  .  Zoledronic Acid (RECLAST IV), Inject into the vein. Every 12 months, Disp: , Rfl:

## 2017-07-12 NOTE — Patient Instructions (Signed)
COPD: Continue Anoro Continue duoneb as needed for shortness of breath Flu shot today Stay active

## 2017-07-14 ENCOUNTER — Other Ambulatory Visit: Payer: Self-pay

## 2017-07-14 MED ORDER — ALBUTEROL SULFATE HFA 108 (90 BASE) MCG/ACT IN AERS: 2.0000 | INHALATION_SPRAY | Freq: Four times a day (QID) | RESPIRATORY_TRACT | 3 refills | Status: DC | PRN

## 2017-08-10 ENCOUNTER — Telehealth: Payer: Self-pay | Admitting: Pulmonary Disease

## 2017-08-10 NOTE — Telephone Encounter (Signed)
Spoke with patient. She stated that she received a letter last month stating that [facility] would not cover her ProAir. Advised patient that ProAir was never sent in, but a similar medication Proventil was sent in. I explained to her that they are the same medications. She verbalized understanding.   Called [facility] to see which medication was denied. Pharmacist stated that the Proventil was not covered, but Ventolin was. It appears that the pharmacy only ran one name. He stated that he would go ahead and process the Ventolin RX.   Patient is aware that the Ventolin is now on the way. Nothing else needed at time of call.

## 2017-12-01 ENCOUNTER — Ambulatory Visit (INDEPENDENT_AMBULATORY_CARE_PROVIDER_SITE_OTHER): Payer: Medicare Other | Admitting: Adult Health

## 2017-12-01 ENCOUNTER — Encounter: Payer: Self-pay | Admitting: Adult Health

## 2017-12-01 DIAGNOSIS — J441 Chronic obstructive pulmonary disease with (acute) exacerbation: Secondary | ICD-10-CM

## 2017-12-01 MED ORDER — AZITHROMYCIN 250 MG PO TABS: ORAL_TABLET | ORAL | 0 refills | Status: DC

## 2017-12-01 MED ORDER — UMECLIDINIUM-VILANTEROL 62.5-25 MCG/INH IN AEPB: 1.0000 | INHALATION_SPRAY | Freq: Every day | RESPIRATORY_TRACT | 0 refills | Status: DC

## 2017-12-01 NOTE — Progress Notes (Signed)
Reviewed, agree 

## 2017-12-01 NOTE — Progress Notes (Signed)
@Patient  ID: Yolanda Morris, female    DOB: [birth year], 72 y.o.   MRN: [id number]  Chief Complaint  Patient presents with  . Acute Visit    cough     Referring provider: [name], MD  HPI: 72 yo female former smoker followed for COPD   TEST  PFT's 2011:  FEV1 1.25 (54%), ratio 52, +airtrapping, DLCO 77% [name] 06/2012:  FEV1 1.00 (43%), ratio 51% Completed pulmonary rehab 2014.   12/01/2017 Acute OV : COPD Pt presents for an acute office visit.  Patient complains over the last 4 days that she has had increased cough congestion with thick yellow mucus, sinus drainage sinus pressure and shortness of breath.  She denies any fever ,hemoptysis chest pain orthopnea PND or increased leg swelling.  She remains on [facility] daily for her severe COPD.  She has been taking Mucinex and Tylenol.     Allergies  Allergen Reactions  . Bacitracin     rash  . Levofloxacin     hives    Immunization History  Administered Date(s) Administered  . Influenza Split 06/18/2011, 07/10/2012, 08/17/2013  . Influenza, High Dose Seasonal PF 07/12/2017  . Influenza,inj,Quad PF,6+ Mos 07/07/2015, 07/11/2016  . Influenza-Unspecified 07/16/2014  . Pneumococcal Conjugate-13 12/18/2013  . Pneumococcal Polysaccharide-23 07/10/2012    Past Medical History:  Diagnosis Date  . COPD (chronic obstructive pulmonary disease) ([place])   . DM (diabetes mellitus) ([place])   . Dyspnea    increased activity   . Genital herpes   . Hyperlipidemia   . Migraines   . Osteoporosis   . Unspecified essential hypertension     Tobacco History: Social History   Tobacco Use  Smoking Status Former Smoker  . Packs/day: 2.00  . Years: 25.00  . Pack years: 50.00  . Types: Cigarettes  . Last attempt to quit: 10/17/1996  . Years since quitting: 21.1  Smokeless Tobacco Never Used   Counseling given: Not Answered   Outpatient Encounter Medications as of 12/01/2017  Medication Sig  . acetaminophen (TYLENOL) 325 MG  tablet Take 650 mg by mouth every 6 (six) hours as needed.  Marland Kitchen albuterol (PROVENTIL HFA;VENTOLIN HFA) 108 (90 Base) MCG/ACT inhaler Inhale 2 puffs into the lungs every 6 (six) hours as needed for wheezing or shortness of breath.  Marland Kitchen aspirin EC 81 MG EC tablet Take 81 mg by mouth daily.    . calcium carbonate (OS-CAL - DOSED IN MG OF ELEMENTAL CALCIUM) 1250 (500 Ca) MG tablet Take 1 tablet by mouth daily with breakfast.  . chlorpheniramine (CHLOR-TRIMETON) 4 MG tablet Take 4 mg by mouth daily as needed for allergies.  . Cholecalciferol (VITAMIN D3) 1000 UNITS CAPS Take 1 capsule by mouth daily.    . Cranberry 500 MG TABS Take 1 tablet by mouth daily.  Marland Kitchen ipratropium-albuterol (DUONEB) 0.5-2.5 (3) MG/3ML SOLN Take 3 mLs by nebulization 4 (four) times daily. Dx: J44.9  . losartan (COZAAR) 100 MG tablet Take 100 mg by mouth daily.   Marland Kitchen Lysine 500 MG CAPS Take 1 capsule by mouth daily.    . Multiple Vitamins-Minerals (CENTRUM SILVER PO) Take 1 tablet by mouth daily.    Marland Kitchen omeprazole (PRILOSEC) 20 MG capsule Take 20 mg by mouth daily.  . simvastatin (ZOCOR) 40 MG tablet Take 40 mg by mouth at bedtime.    Marland Kitchen Spacer/Aero-Holding Chambers (AEROCHAMBER Z-STAT PLUS) inhaler Use as instructed  . umeclidinium-vilanterol (ANORO ELLIPTA) 62.5-25 MCG/INH AEPB Inhale 1 puff into the lungs daily.  . valACYclovir (VALTREX)  500 MG tablet Take 500 mg by mouth 2 (two) times daily as needed.    . Zoledronic Acid (RECLAST IV) Inject into the vein. Every 12 months  . azithromycin (ZITHROMAX) 250 MG tablet Take as directed  . [DISCONTINUED] levocetirizine (XYZAL) 5 MG tablet Take 5 mg by mouth every evening.    . [DISCONTINUED] SYMBICORT 160-4.5 MCG/ACT inhaler Use 2 puffs twice daily (Patient not taking: Reported on 12/01/2017)   No facility-administered encounter medications on file as of 12/01/2017.      Review of Systems  Constitutional:   No  weight loss, night sweats,  Fevers, chills,  +fatigue, or   lassitude.  HEENT:   No headaches,  Difficulty swallowing,  Tooth/dental problems, or  Sore throat,                No sneezing, itching, ear ache,  +nasal congestion, post nasal drip,   CV:  No chest pain,  Orthopnea, PND, swelling in lower extremities, anasarca, dizziness, palpitations, syncope.   GI  No heartburn, indigestion, abdominal pain, nausea, vomiting, diarrhea, change in bowel habits, loss of appetite, bloody stools.   Resp:   No chest wall deformity  Skin: no rash or lesions.  GU: no dysuria, change in color of urine, no urgency or frequency.  No flank pain, no hematuria   MS:  No joint pain or swelling.  No decreased range of motion.  No back pain.    Physical Exam  BP 116/64 (BP Location: Left Arm, Cuff Size: Normal)   Pulse 85   Ht 5' 2.75" (1.594 m)   Wt 152 lb 6.4 oz (69.1 kg)   SpO2 94%   BMI 27.21 kg/m   GEN: A/Ox3; pleasant , NAD,elderly and thin    HEENT:  [place]/AT,  EACs-clear, TMs-wnl, NOSE-clear, THROAT-clear, no lesions, no postnasal drip or exudate noted.   NECK:  Supple w/ fair ROM; no JVD; normal carotid impulses w/o bruits; no thyromegaly or nodules palpated; no lymphadenopathy.    RESP decreased breath in the bases , no accessory muscle use, no dullness to percussion  CARD:  RRR, no m/r/g, no peripheral edema, pulses intact, no cyanosis or clubbing.  GI:   Soft & nt; nml bowel sounds; no organomegaly or masses detected.   Musco: Warm bil, no deformities or joint swelling noted.   Neuro: alert, no focal deficits noted.    Skin: Warm, no lesions or rashes    Lab Results:  CBC No results found for: WBC, RBC, HGB, HCT, PLT, MCV, MCH, MCHC, RDW, LYMPHSABS, MONOABS, EOSABS, BASOSABS  BMET No results found for: NA, K, CL, CO2, GLUCOSE, BUN, CREATININE, CALCIUM, GFRNONAA, GFRAA  BNP No results found for: BNP  ProBNP No results found for: PROBNP  Imaging: No results found.   Assessment & Plan:   COPD exacerbation Acute  exacerbation with URI /Bronchitis   Plan  Patient Instructions  Zpack take as directed .  Mucinex DM Twice daily  As needed  Cough/congestion  Continue on ANORO daily , rinse after use.  Fluids and rest  Follow up with Dr. Lake Bells as planned and As needed   Please contact office for sooner follow up if symptoms do not improve or worsen or seek emergency care         Rexene Edison, NP 12/01/2017

## 2017-12-01 NOTE — Patient Instructions (Signed)
Zpack take as directed .  Mucinex DM Twice daily  As needed  Cough/congestion  Continue on ANORO daily , rinse after use.  Fluids and rest  Follow up with Dr. Lake Bells as planned and As needed   Please contact office for sooner follow up if symptoms do not improve or worsen or seek emergency care

## 2017-12-01 NOTE — Addendum Note (Signed)
Addended by: Parke Poisson E on: 12/01/2017 11:47 AM   Modules accepted: Orders

## 2017-12-01 NOTE — Assessment & Plan Note (Signed)
Acute exacerbation with URI /Bronchitis   Plan  Patient Instructions  Zpack take as directed .  Mucinex DM Twice daily  As needed  Cough/congestion  Continue on ANORO daily , rinse after use.  Fluids and rest  Follow up with Dr. Lake Bells as planned and As needed   Please contact office for sooner follow up if symptoms do not improve or worsen or seek emergency care

## 2017-12-05 ENCOUNTER — Telehealth: Payer: Self-pay | Admitting: Adult Health

## 2017-12-05 MED ORDER — PREDNISONE 10 MG PO TABS: ORAL_TABLET | ORAL | 0 refills | Status: DC

## 2017-12-05 NOTE — Telephone Encounter (Signed)
Called and spoke with patient, she states that she was seen on 2.15 by TP. She has finished her antibiotic but is still taking the mucinex. Patient complains of body aches, chills, sneezing, heaviness in chest, congestion, SOB, and lungs hurting. Patient denies fever and nausea. TP please advise on what patient needs to do, thanks.

## 2017-12-05 NOTE — Telephone Encounter (Signed)
Pt aware of recs.  rx sent to preferred pharmacy.  Nothing further needed.  

## 2017-12-05 NOTE — Telephone Encounter (Signed)
lmtcb X1 for pt.  Will call in pred taper after speaking to pt.

## 2017-12-05 NOTE — Telephone Encounter (Signed)
Pt is returning call. Cb is [phone]

## 2017-12-05 NOTE — Telephone Encounter (Signed)
Sorry to hear that . Prednisone taper over next week  Prednisone 10mg  #20 4 tabs for 2 days, then 3 tabs for 2 days, 2 tabs for 2 days, then 1 tab for 2 days, then stop  If not improving will need ov  Please contact office for sooner follow up if symptoms do not improve or worsen or seek emergency care

## 2018-01-04 DIAGNOSIS — Z1389 Encounter for screening for other disorder: Secondary | ICD-10-CM | POA: Diagnosis not present

## 2018-01-04 DIAGNOSIS — Z1211 Encounter for screening for malignant neoplasm of colon: Secondary | ICD-10-CM | POA: Diagnosis not present

## 2018-01-04 DIAGNOSIS — Z0000 Encounter for general adult medical examination without abnormal findings: Secondary | ICD-10-CM | POA: Diagnosis not present

## 2018-01-04 DIAGNOSIS — E785 Hyperlipidemia, unspecified: Secondary | ICD-10-CM | POA: Diagnosis not present

## 2018-01-04 DIAGNOSIS — J449 Chronic obstructive pulmonary disease, unspecified: Secondary | ICD-10-CM | POA: Diagnosis not present

## 2018-01-04 DIAGNOSIS — E119 Type 2 diabetes mellitus without complications: Secondary | ICD-10-CM | POA: Diagnosis not present

## 2018-01-04 DIAGNOSIS — M810 Age-related osteoporosis without current pathological fracture: Secondary | ICD-10-CM | POA: Diagnosis not present

## 2018-01-04 DIAGNOSIS — I10 Essential (primary) hypertension: Secondary | ICD-10-CM | POA: Diagnosis not present

## 2018-01-26 ENCOUNTER — Other Ambulatory Visit: Payer: Self-pay | Admitting: Family Medicine

## 2018-01-26 DIAGNOSIS — Z1231 Encounter for screening mammogram for malignant neoplasm of breast: Secondary | ICD-10-CM

## 2018-03-05 ENCOUNTER — Ambulatory Visit
Admission: RE | Admit: 2018-03-05 | Discharge: 2018-03-05 | Disposition: A | Discharge status: Home / Self Care | Payer: Medicare Other | Source: Ambulatory Visit | Attending: Family Medicine | Admitting: Family Medicine

## 2018-03-05 DIAGNOSIS — Z1231 Encounter for screening mammogram for malignant neoplasm of breast: Secondary | ICD-10-CM | POA: Diagnosis not present

## 2018-05-03 ENCOUNTER — Other Ambulatory Visit: Payer: Self-pay | Admitting: Internal Medicine

## 2018-05-03 ENCOUNTER — Ambulatory Visit
Admission: RE | Admit: 2018-05-03 | Discharge: 2018-05-03 | Disposition: A | Discharge status: Home / Self Care | Payer: Medicare Other | Source: Ambulatory Visit | Attending: Internal Medicine | Admitting: Internal Medicine

## 2018-05-03 DIAGNOSIS — M810 Age-related osteoporosis without current pathological fracture: Secondary | ICD-10-CM

## 2018-05-03 DIAGNOSIS — Z5181 Encounter for therapeutic drug level monitoring: Secondary | ICD-10-CM | POA: Diagnosis not present

## 2018-05-03 DIAGNOSIS — Z8781 Personal history of (healed) traumatic fracture: Secondary | ICD-10-CM | POA: Diagnosis not present

## 2018-05-03 DIAGNOSIS — M79651 Pain in right thigh: Secondary | ICD-10-CM

## 2018-05-03 DIAGNOSIS — M79659 Pain in unspecified thigh: Secondary | ICD-10-CM | POA: Diagnosis not present

## 2018-05-03 DIAGNOSIS — M79652 Pain in left thigh: Secondary | ICD-10-CM

## 2018-05-28 DIAGNOSIS — D126 Benign neoplasm of colon, unspecified: Secondary | ICD-10-CM | POA: Diagnosis not present

## 2018-05-28 DIAGNOSIS — Z86010 Personal history of colonic polyps: Secondary | ICD-10-CM | POA: Diagnosis not present

## 2018-05-30 DIAGNOSIS — D126 Benign neoplasm of colon, unspecified: Secondary | ICD-10-CM | POA: Diagnosis not present

## 2018-05-31 ENCOUNTER — Ambulatory Visit (HOSPITAL_COMMUNITY)
Admission: RE | Admit: 2018-05-31 | Discharge: 2018-05-31 | Disposition: A | Discharge status: Home / Self Care | Payer: Medicare Other | Source: Ambulatory Visit | Attending: Internal Medicine | Admitting: Internal Medicine

## 2018-05-31 DIAGNOSIS — M810 Age-related osteoporosis without current pathological fracture: Secondary | ICD-10-CM | POA: Insufficient documentation

## 2018-05-31 MED ORDER — SODIUM CHLORIDE 0.9 % IV SOLN
INTRAVENOUS | Status: DC
  Administered 2018-05-31: 09:00:00 via INTRAVENOUS

## 2018-05-31 MED ORDER — ZOLEDRONIC ACID 5 MG/100ML IV SOLN
5.0000 mg | Freq: Once | INTRAVENOUS | Status: AC
  Administered 2018-05-31: 5 mg via INTRAVENOUS
  Filled 2018-05-31: qty 100

## 2018-05-31 NOTE — Discharge Instructions (Signed)

## 2018-07-02 ENCOUNTER — Other Ambulatory Visit: Payer: Self-pay | Admitting: Pulmonary Disease

## 2018-07-02 DIAGNOSIS — J438 Other emphysema: Secondary | ICD-10-CM

## 2018-07-02 DIAGNOSIS — H6121 Impacted cerumen, right ear: Secondary | ICD-10-CM | POA: Diagnosis not present

## 2018-07-02 DIAGNOSIS — H9203 Otalgia, bilateral: Secondary | ICD-10-CM | POA: Diagnosis not present

## 2018-07-02 DIAGNOSIS — H9191 Unspecified hearing loss, right ear: Secondary | ICD-10-CM | POA: Diagnosis not present

## 2018-07-09 DIAGNOSIS — J449 Chronic obstructive pulmonary disease, unspecified: Secondary | ICD-10-CM | POA: Diagnosis not present

## 2018-07-09 DIAGNOSIS — Z7984 Long term (current) use of oral hypoglycemic drugs: Secondary | ICD-10-CM | POA: Diagnosis not present

## 2018-07-09 DIAGNOSIS — E1169 Type 2 diabetes mellitus with other specified complication: Secondary | ICD-10-CM | POA: Diagnosis not present

## 2018-07-09 DIAGNOSIS — I10 Essential (primary) hypertension: Secondary | ICD-10-CM | POA: Diagnosis not present

## 2018-07-09 DIAGNOSIS — E785 Hyperlipidemia, unspecified: Secondary | ICD-10-CM | POA: Diagnosis not present

## 2018-07-11 DIAGNOSIS — Z23 Encounter for immunization: Secondary | ICD-10-CM | POA: Diagnosis not present

## 2018-07-18 DIAGNOSIS — E119 Type 2 diabetes mellitus without complications: Secondary | ICD-10-CM | POA: Diagnosis not present

## 2018-07-18 DIAGNOSIS — H43311 Vitreous membranes and strands, right eye: Secondary | ICD-10-CM | POA: Diagnosis not present

## 2018-07-18 DIAGNOSIS — H2513 Age-related nuclear cataract, bilateral: Secondary | ICD-10-CM | POA: Diagnosis not present

## 2018-07-24 ENCOUNTER — Other Ambulatory Visit: Payer: Self-pay | Admitting: Pulmonary Disease

## 2018-08-15 ENCOUNTER — Encounter: Payer: Self-pay | Admitting: Pulmonary Disease

## 2018-08-15 ENCOUNTER — Ambulatory Visit (INDEPENDENT_AMBULATORY_CARE_PROVIDER_SITE_OTHER): Payer: Medicare Other | Admitting: Pulmonary Disease

## 2018-08-15 ENCOUNTER — Ambulatory Visit (INDEPENDENT_AMBULATORY_CARE_PROVIDER_SITE_OTHER)
Admission: RE | Admit: 2018-08-15 | Discharge: 2018-08-15 | Disposition: A | Discharge status: Home / Self Care | Payer: Medicare Other | Source: Ambulatory Visit | Attending: Pulmonary Disease | Admitting: Pulmonary Disease

## 2018-08-15 VITALS — BP 128/68 | HR 93 | Ht 62.0 in | Wt 153.0 lb

## 2018-08-15 DIAGNOSIS — J432 Centrilobular emphysema: Secondary | ICD-10-CM

## 2018-08-15 DIAGNOSIS — R0602 Shortness of breath: Secondary | ICD-10-CM | POA: Diagnosis not present

## 2018-08-15 DIAGNOSIS — R0600 Dyspnea, unspecified: Secondary | ICD-10-CM | POA: Diagnosis not present

## 2018-08-15 MED ORDER — GLYCOPYRROLATE-FORMOTEROL 9-4.8 MCG/ACT IN AERO: 2.0000 | INHALATION_SPRAY | Freq: Two times a day (BID) | RESPIRATORY_TRACT | 0 refills | Status: DC

## 2018-08-15 NOTE — Progress Notes (Addendum)
Subjective:    Patient ID: Yolanda Morris, female    DOB: [birth year], 72 y.o.   MRN: [id number]  Synopsis: Former patient of Dr. Gwenette Greet with COPD  Completed pulmonary rehab 2014.   HPI  Chief Complaint  Patient presents with  . Follow-up    increased shortness of breath   Yolanda Morris says that she has been feeling more short of breath for the last year.  She specifically she says that when she walks to her neighbor's house which is about 2 blocks away she is gasping for air.  She will take albuterol nebulized prior to the walk and this will sometimes help but she still has severe symptoms.  She says that she has not been sick with bronchitis or pneumonia since the last visit.  She has not had problems with leg swelling.  She continues to take Anoro 1 puff daily but she feels that it runs out by the afternoon.  She says that she has been using more albuterol nebulized throughout the day for the last several months.  She denies any change in cough.  She says she does not wheeze and she does not produce mucus.  Flu shot was given last month   Past Medical History:  Diagnosis Date  . COPD (chronic obstructive pulmonary disease) ([place])   . DM (diabetes mellitus) ([place])   . Dyspnea    increased activity   . Genital herpes   . Hyperlipidemia   . Migraines   . Osteoporosis   . Unspecified essential hypertension      Review of Systems  Constitutional: Negative for chills, fatigue and fever.  HENT: Negative for postnasal drip, rhinorrhea, sinus pressure and sinus pain.   Respiratory: Positive for shortness of breath. Negative for choking, wheezing and stridor.   Cardiovascular: Negative for chest pain, palpitations and leg swelling.       Objective:   Physical Exam Vitals:   08/15/18 1600  BP: 128/68  Pulse: 93  SpO2: 99%  Weight: 153 lb (69.4 kg)  Height: 5\' 2"  (1.575 m)   RA  Gen: well appearing HENT: OP clear, TM's clear, neck supple PULM: Poor air movement B, normal  percussion CV: RRR, no mgr, trace edema GI: BS+, soft, nontender Derm: no cyanosis or rash Psyche: normal mood and affect    CBC No results found for: WBC, RBC, HGB, HCT, PLT, MCV, MCH, MCHC, RDW, LYMPHSABS, MONOABS, EOSABS, BASOSABS  BMET No results found for: NA, K, CL, CO2, GLUCOSE, BUN, CREATININE, CALCIUM, GFRNONAA, GFRAA  No recent records available for review  Spirometry: PFT's 2011:  FEV1 1.25 (54%), ratio 52, +airtrapping, DLCO 77% Spiro 06/2012:  FEV1 1.00 (43%), ratio 51% Spirometry October 2019 FEV1 0.8 L 38% predicted   Ambulatory oximetry testing August 15, 2018: O2 saturation dropped to 90% while walking 500 feet on room air       Assessment & Plan:   Dyspnea, unspecified type - Plan: Spirometry with graph, DG Chest 2 View  Centrilobular emphysema (HCC)  Discussion: Yolanda Morris has been experiencing more severe shortness of breath from her chronic chronic obstructive lung disease.  She says that this has been worsening in the last year.  On physical exam she has poor airflow obstruction in 6 years ago she had severe airflow obstruction.  So I think that the most likely explanation is that her lung function is worsening as we would anticipate with anyone who is aging and has COPD.  That being said, is a former smoker she  is at increased risk for malignancy so I think getting a chest x-ray is worthwhile.  I think she may also be at a point where inhaled controller medicines are no longer effective and she may need to be changed to a nebulized form.  However before we go that for I like to try her on a twice a day controller medicine.  Plan: Worsening shortness of breath due to severe COPD: Chest x-ray today Stop Anoro Try taking Bivespi twice a day call me if you think that this medicine is effective If that medicine is not effective then we will change you to nebulized controller medicines I am glad you have had a flu shot Practice good hand hygiene Stay  active We will check a spirometry test today We will check your oxygen level while walking today  I would like for you to follow-up in 6 weeks so that we can see how you are doing on the new controller medicine  Addendum 09/26/2018  Bivespi not effective, discussed with patient.  She would like to try using nebulized controller medicines: will Rx Yupelri daily, Perforomist bid for COPD  Roselie Awkward, MD [place] [facility] Pager: [phone] Cell: [phone] If no response, call [phone]    Current Outpatient Medications:  .  acetaminophen (TYLENOL) 325 MG tablet, Take 650 mg by mouth every 6 (six) hours as needed., Disp: , Rfl:  .  albuterol (PROVENTIL HFA;VENTOLIN HFA) 108 (90 Base) MCG/ACT inhaler, Inhale 2 puffs into the lungs every 6 (six) hours as needed for wheezing or shortness of breath., Disp: 3 Inhaler, Rfl: 3 .  ANORO ELLIPTA 62.5-25 MCG/INH AEPB, USE 1 INHALATION DAILY, Disp: 180 each, Rfl: 3 .  aspirin EC 81 MG EC tablet, Take 81 mg by mouth daily.  , Disp: , Rfl:  .  calcium carbonate (OS-CAL - DOSED IN MG OF ELEMENTAL CALCIUM) 1250 (500 Ca) MG tablet, Take 1 tablet by mouth daily with breakfast., Disp: , Rfl:  .  chlorpheniramine (CHLOR-TRIMETON) 4 MG tablet, Take 4 mg by mouth daily as needed for allergies., Disp: , Rfl:  .  Cholecalciferol (VITAMIN D3) 1000 UNITS CAPS, Take 1 capsule by mouth daily.  , Disp: , Rfl:  .  Cranberry 500 MG TABS, Take 1 tablet by mouth daily., Disp: , Rfl:  .  ipratropium-albuterol (DUONEB) 0.5-2.5 (3) MG/3ML SOLN, USE 1 VIAL IN NEBULIZER 4 TIMES DAILY. Generic: DUONEB, Disp: 90 mL, Rfl: 0 .  losartan (COZAAR) 100 MG tablet, Take 100 mg by mouth daily. , Disp: , Rfl:  .  Lysine 500 MG CAPS, Take 1 capsule by mouth daily.  , Disp: , Rfl:  .  metFORMIN (GLUCOPHAGE-XR) 500 MG 24 hr tablet, , Disp: , Rfl:  .  Multiple Vitamins-Minerals (CENTRUM SILVER PO), Take 1 tablet by mouth daily.  , Disp: , Rfl:  .  omeprazole (PRILOSEC) 20 MG capsule, Take  20 mg by mouth daily., Disp: , Rfl:  .  simvastatin (ZOCOR) 40 MG tablet, Take 40 mg by mouth at bedtime.  , Disp: , Rfl:  .  Spacer/Aero-Holding Chambers (AEROCHAMBER Z-STAT PLUS) inhaler, Use as instructed, Disp: 1 each, Rfl: 0 .  valACYclovir (VALTREX) 500 MG tablet, Take 500 mg by mouth 2 (two) times daily as needed.  , Disp: , Rfl:  .  Zoledronic Acid (RECLAST IV), Inject into the vein. Every 12 months, Disp: , Rfl:  .  Glycopyrrolate-Formoterol (BEVESPI AEROSPHERE) 9-4.8 MCG/ACT AERO, Inhale 2 puffs into the lungs 2 (two) times daily., Disp:  2 Inhaler, Rfl: 0

## 2018-08-15 NOTE — Patient Instructions (Signed)
Worsening shortness of breath due to severe COPD: Chest x-ray today Stop Anoro Try taking Bivespi twice a day call me if you think that this medicine is effective If that medicine is not effective then we will change you to nebulized controller medicines I am glad you have had a flu shot Practice good hand hygiene Stay active We will check a spirometry test today We will check your oxygen level while walking today  I would like for you to follow-up in 6 weeks so that we can see how you are doing on the new controller medicine

## 2018-08-28 ENCOUNTER — Telehealth: Payer: Self-pay | Admitting: Pulmonary Disease

## 2018-08-28 NOTE — Telephone Encounter (Signed)
LMOM TCB x1  Per 10.30.19 visit with BQ: Patient Instructions Worsening shortness of breath due to severe COPD: Chest x-ray today Stop Anoro Try taking Bivespi twice a day call me if you think that this medicine is effective If that medicine is not effective then we will change you to nebulized controller medicines I am glad you have had a flu shot Practice good hand hygiene Stay active We will check a spirometry test today We will check your oxygen level while walking today   I would like for you to follow-up in 6 weeks so that we can see how you are doing on the new controller medicine

## 2018-08-28 NOTE — Telephone Encounter (Signed)
She states she wanted to know if he wants to start her on Anoro by nebulizer as BQ had discussed so she can try this out before her follow up with Aaron Edelman on 12/17.  Pharm is [facility] on [address] in [place].

## 2018-08-28 NOTE — Telephone Encounter (Signed)
OK to Rx Yupelri daily and Perforomist bid through medicare part B

## 2018-08-28 NOTE — Telephone Encounter (Signed)
Spoke with pt, she states she would like update on the [place]. She states the Anoro inhaler worked better for her so she wants to try the nebulizer medication instead. She is wanting to know if we can go ahead and order the nebulizer and medications now before she comes back on 12/17 for a follow up so she can advise if these medications are doing better. BQ please advise.  Current Outpatient Medications on File Prior to Visit  Medication Sig Dispense Refill  . acetaminophen (TYLENOL) 325 MG tablet Take 650 mg by mouth every 6 (six) hours as needed.    Marland Kitchen albuterol (PROVENTIL HFA;VENTOLIN HFA) 108 (90 Base) MCG/ACT inhaler Inhale 2 puffs into the lungs every 6 (six) hours as needed for wheezing or shortness of breath. 3 Inhaler 3  . ANORO ELLIPTA 62.5-25 MCG/INH AEPB USE 1 INHALATION DAILY 180 each 3  . aspirin EC 81 MG EC tablet Take 81 mg by mouth daily.      . calcium carbonate (OS-CAL - DOSED IN MG OF ELEMENTAL CALCIUM) 1250 (500 Ca) MG tablet Take 1 tablet by mouth daily with breakfast.    . chlorpheniramine (CHLOR-TRIMETON) 4 MG tablet Take 4 mg by mouth daily as needed for allergies.    . Cholecalciferol (VITAMIN D3) 1000 UNITS CAPS Take 1 capsule by mouth daily.      . Cranberry 500 MG TABS Take 1 tablet by mouth daily.    . Glycopyrrolate-Formoterol (BEVESPI AEROSPHERE) 9-4.8 MCG/ACT AERO Inhale 2 puffs into the lungs 2 (two) times daily. 2 Inhaler 0  . ipratropium-albuterol (DUONEB) 0.5-2.5 (3) MG/3ML SOLN USE 1 VIAL IN NEBULIZER 4 TIMES DAILY. Generic: DUONEB 90 mL 0  . losartan (COZAAR) 100 MG tablet Take 100 mg by mouth daily.     Marland Kitchen Lysine 500 MG CAPS Take 1 capsule by mouth daily.      . metFORMIN (GLUCOPHAGE-XR) 500 MG 24 hr tablet     . Multiple Vitamins-Minerals (CENTRUM SILVER PO) Take 1 tablet by mouth daily.      Marland Kitchen omeprazole (PRILOSEC) 20 MG capsule Take 20 mg by mouth daily.    . simvastatin (ZOCOR) 40 MG tablet Take 40 mg by mouth at bedtime.      Marland Kitchen Spacer/Aero-Holding  Chambers (AEROCHAMBER Z-STAT PLUS) inhaler Use as instructed 1 each 0  . valACYclovir (VALTREX) 500 MG tablet Take 500 mg by mouth 2 (two) times daily as needed.      . Zoledronic Acid (RECLAST IV) Inject into the vein. Every 12 months    . [DISCONTINUED] levocetirizine (XYZAL) 5 MG tablet Take 5 mg by mouth every evening.       No current facility-administered medications on file prior to visit.    Allergies  Allergen Reactions  . Bacitracin     rash  . Levofloxacin     hives

## 2018-08-29 ENCOUNTER — Telehealth: Payer: Self-pay | Admitting: Pulmonary Disease

## 2018-08-29 MED ORDER — REVEFENACIN 175 MCG/3ML IN SOLN: 3.0000 mL | Freq: Two times a day (BID) | RESPIRATORY_TRACT | 0 refills | Status: DC

## 2018-08-29 MED ORDER — FORMOTEROL FUMARATE 20 MCG/2ML IN NEBU: 20.0000 ug | INHALATION_SOLUTION | Freq: Two times a day (BID) | RESPIRATORY_TRACT | 3 refills | Status: DC

## 2018-08-29 NOTE — Telephone Encounter (Signed)
Patient returned phone call; pt contact # [phone]

## 2018-08-29 NOTE — Telephone Encounter (Signed)
Called patient unable to reach left message to give us a call back.

## 2018-08-29 NOTE — Telephone Encounter (Signed)
Called and spoke with [place]. Verification given. Nothing further needed.

## 2018-08-29 NOTE — Telephone Encounter (Signed)
Called pt and advised message from the provider. Pt understood and verbalized understanding. Nothing further is needed.   Rx's for nebulizer was sent to [facility].

## 2018-09-18 ENCOUNTER — Telehealth: Payer: Self-pay | Admitting: Pulmonary Disease

## 2018-09-18 NOTE — Telephone Encounter (Signed)
Spoke with [facility] pharmacy and they stated the patient could receive her Yuperli and Performist but we had to fax in the last two office notes to [phone]. I will fax this in to them and let pt know. She understood and nothing further Is needed.

## 2018-09-20 ENCOUNTER — Telehealth: Payer: Self-pay | Admitting: Pulmonary Disease

## 2018-09-20 DIAGNOSIS — L821 Other seborrheic keratosis: Secondary | ICD-10-CM | POA: Diagnosis not present

## 2018-09-20 DIAGNOSIS — L570 Actinic keratosis: Secondary | ICD-10-CM | POA: Diagnosis not present

## 2018-09-20 DIAGNOSIS — Z23 Encounter for immunization: Secondary | ICD-10-CM | POA: Diagnosis not present

## 2018-09-20 DIAGNOSIS — Z85828 Personal history of other malignant neoplasm of skin: Secondary | ICD-10-CM | POA: Diagnosis not present

## 2018-09-20 NOTE — Telephone Encounter (Signed)
Call made to [facility] [phone]. Spoke with Marolyn Hammock, they need office visit notes faxed to [phone]. Confirmation confirmed.   Left message informing the patient that we have faxed information and if she needed anything else to give [place] a call. Nothing further is needed at this time.

## 2018-09-21 ENCOUNTER — Telehealth: Payer: Self-pay | Admitting: Pulmonary Disease

## 2018-09-21 NOTE — Telephone Encounter (Signed)
Call made to patient, patient states [facility] states they need the office note addended to mention the medication Yupelri in order to get it approved for the patient. Call made to [facility] [phone], per pharmacist the office note needs to mention  DX of COPD and Yupelri and Perforomist dosage and usage.   BQ please advise once office note has been addended from 08/15/2018. Thanks.

## 2018-09-24 NOTE — Telephone Encounter (Signed)
BQ is out of office until 10/02/2018 will hold in box until he returns,

## 2018-09-26 NOTE — Telephone Encounter (Signed)
done

## 2018-09-26 NOTE — Telephone Encounter (Signed)
BQ please advise once available, thank you.

## 2018-10-01 NOTE — Progress Notes (Signed)
@Patient  ID: Yolanda Morris, female    DOB: 1946/06/28, 72 y.o.   MRN: [id number]  Chief Complaint  Patient presents with  . Follow-up    COPD    Referring provider: [name], MD  HPI:  72 year old female former smoker followed in our office for COPD  PMH:  Smoker/ Smoking History: Former smoker.  Quit 1998.  50-pack-year smoking history. Maintenance:  Yupelri and Performist, prev failed: bevespi and anoro Pt of: Dr. Lake Bells  Former patient of Dr. Gwenette Greet with COPD  Completed pulmonary rehab 2014.   Recent [place] Pulmonary Encounters:     10/02/2018  - Visit   72 year old female former smoker presenting to the office today for 6-week follow-up for management of her COPD.  At last office visit patient was instructed to try Bevespi and was provided a sample.  Patient reports that this did not help her breathing she did not like it and she went back to her Anoro Ellipta.  Orders were then placed to start patient on new [place] and perform his nebulized medications to replace her Anoro Ellipta as we have concerns the patient struggling with dry powder inhalers and does not have enough of an inspiratory pull.  Patient is still maintained on Anoro Ellipta she has not received her nebulized medications from the [facility].  Patient still believes that she is not getting all of her medication as she struggles to have a deep inspiratory pull.  MMRC - Breathlessness Score 2 - on level ground, I walk slower than people of the same age because of breathlessness, or have to stop for breathe when walking to my own pace  Patient has a handicap placard application that she would like filled out today.  Patient also had questions regarding her chest x-ray from last office visit which showed a new mid compression fracture mid thoracic vertebral body new since 2014.    Tests:  Spirometry: PFT's 2011:  FEV1 1.25 (54%), ratio 52, +airtrapping, DLCO 77% [name] 06/2012:  FEV1 1.00 (43%),  ratio 51% Spirometry October 2019 FEV1 0.8 L 38% predicted  08/16/2018-chest x-ray-COPD and bibasilar scarring, mild compression fracture mid thoracic vertebral body, new since 2014  08/15/2018-spirometry- FVC 1.5 (56%), ratio 52, FEV1 39  Ambulatory oximetry testing August 15, 2018: O2 saturation dropped to 90% while walking 500 feet on room air  FENO:  No results found for: NITRICOXIDE  PFT: No flowsheet data found.  Imaging: No results found.  Chart Review:    Specialty Problems      Pulmonary Problems   COPD (chronic obstructive pulmonary disease) ([place])    PFT's 2011:  FEV1 1.25 (54%), ratio 52, +airtrapping, DLCO 77% [name] 06/2012:  FEV1 1.00 (43%), ratio 51% Completed pulmonary rehab 2014.       Acute bronchitis   Acute sinusitis   Cough   COPD exacerbation (HCC)      Allergies  Allergen Reactions  . Bacitracin     rash  . Levofloxacin     hives    Immunization History  Administered Date(s) Administered  . Influenza Split 06/18/2011, 07/10/2012, 08/17/2013  . Influenza, High Dose Seasonal PF 07/12/2017  . Influenza,inj,Quad PF,6+ Mos 07/07/2015, 07/11/2016  . Influenza-Unspecified 07/16/2014, 07/16/2018  . Pneumococcal Conjugate-13 12/18/2013  . Pneumococcal Polysaccharide-23 07/10/2012    Past Medical History:  Diagnosis Date  . COPD (chronic obstructive pulmonary disease) ([place])   . DM (diabetes mellitus) ([place])   . Dyspnea    increased activity   . Genital herpes   .  Hyperlipidemia   . Migraines   . Osteoporosis   . Unspecified essential hypertension     Tobacco History: Social History   Tobacco Use  Smoking Status Former Smoker  . Packs/day: 2.00  . Years: 25.00  . Pack years: 50.00  . Types: Cigarettes  . Last attempt to quit: 10/17/1996  . Years since quitting: 21.9  Smokeless Tobacco Never Used   Counseling given: Yes  Continue to not smoke  Outpatient Encounter Medications as of 10/02/2018  Medication Sig  .  acetaminophen (TYLENOL) 325 MG tablet Take 650 mg by mouth every 6 (six) hours as needed.  Marland Kitchen albuterol (PROVENTIL HFA;VENTOLIN HFA) 108 (90 Base) MCG/ACT inhaler Inhale 2 puffs into the lungs every 6 (six) hours as needed for wheezing or shortness of breath.  Jearl Klinefelter ELLIPTA 62.5-25 MCG/INH AEPB USE 1 INHALATION DAILY  . aspirin EC 81 MG EC tablet Take 81 mg by mouth daily.    . calcium carbonate (OS-CAL - DOSED IN MG OF ELEMENTAL CALCIUM) 1250 (500 Ca) MG tablet Take 1 tablet by mouth daily with breakfast.  . chlorpheniramine (CHLOR-TRIMETON) 4 MG tablet Take 4 mg by mouth daily as needed for allergies.  . Cholecalciferol (VITAMIN D3) 1000 UNITS CAPS Take 1 capsule by mouth daily.    . Cranberry 500 MG TABS Take 1 tablet by mouth daily.  Marland Kitchen losartan (COZAAR) 100 MG tablet Take 100 mg by mouth daily.   Marland Kitchen Lysine 500 MG CAPS Take 1 capsule by mouth daily.    . metFORMIN (GLUCOPHAGE-XR) 500 MG 24 hr tablet   . Multiple Vitamins-Minerals (CENTRUM SILVER PO) Take 1 tablet by mouth daily.    Marland Kitchen omeprazole (PRILOSEC) 20 MG capsule Take 20 mg by mouth every other day.   . simvastatin (ZOCOR) 40 MG tablet Take 40 mg by mouth at bedtime.    . valACYclovir (VALTREX) 500 MG tablet Take 500 mg by mouth 2 (two) times daily as needed.    . Zoledronic Acid (RECLAST IV) Inject into the vein. Every 12 months  . [DISCONTINUED] ipratropium-albuterol (DUONEB) 0.5-2.5 (3) MG/3ML SOLN USE 1 VIAL IN NEBULIZER 4 TIMES DAILY. Generic: DUONEB  . albuterol (PROAIR HFA) 108 (90 Base) MCG/ACT inhaler Inhale 1-2 puffs into the lungs every 6 (six) hours as needed for wheezing or shortness of breath.  Marland Kitchen albuterol (PROVENTIL) (2.5 MG/3ML) 0.083% nebulizer solution Take 3 mLs (2.5 mg total) by nebulization every 6 (six) hours as needed for wheezing or shortness of breath.  . formoterol (PERFOROMIST) 20 MCG/2ML nebulizer solution Take 2 mLs (20 mcg total) by nebulization 2 (two) times daily. (Patient not taking: Reported on  10/02/2018)  . Glycopyrrolate-Formoterol (BEVESPI AEROSPHERE) 9-4.8 MCG/ACT AERO Inhale 2 puffs into the lungs 2 (two) times daily. (Patient not taking: Reported on 10/02/2018)  . Revefenacin (YUPELRI) 175 MCG/3ML SOLN Inhale 3 mLs into the lungs 2 (two) times daily. (Patient not taking: Reported on 10/02/2018)  . Spacer/Aero-Holding Chambers (AEROCHAMBER Z-STAT PLUS) inhaler Use as instructed (Patient not taking: Reported on 10/02/2018)  . Tiotropium Bromide-Olodaterol (STIOLTO RESPIMAT) 2.5-2.5 MCG/ACT AERS Inhale 2 puffs into the lungs daily.  . [DISCONTINUED] levocetirizine (XYZAL) 5 MG tablet Take 5 mg by mouth every evening.     No facility-administered encounter medications on file as of 10/02/2018.     Review of Systems  Review of Systems  Constitutional: Positive for fatigue. Negative for chills, fever and unexpected weight change.  HENT: Negative for congestion, ear pain, postnasal drip, sinus pressure and sinus pain.  Respiratory: Positive for cough (dry cough ), shortness of breath and wheezing. Negative for chest tightness.   Cardiovascular: Negative for chest pain and palpitations.  Gastrointestinal: Negative for blood in stool, diarrhea, nausea and vomiting.  Musculoskeletal: Negative for arthralgias.  Skin: Negative for color change.  Allergic/Immunologic: Negative for environmental allergies (rain and damp weather are triggers ) and food allergies.  Neurological: Negative for dizziness, light-headedness and headaches.  Psychiatric/Behavioral: Negative for dysphoric mood. The patient is not nervous/anxious.   All other systems reviewed and are negative.    Physical Exam  BP 132/82 (BP Location: Left Arm, Cuff Size: Normal)   Pulse 92   Ht 5\' 2"  (1.575 m)   Wt 158 lb (71.7 kg)   SpO2 92%   BMI 28.90 kg/m   Wt Readings from Last 5 Encounters:  10/02/18 158 lb (71.7 kg)  08/15/18 153 lb (69.4 kg)  05/31/18 152 lb (68.9 kg)  12/01/17 152 lb 6.4 oz (69.1 kg)    07/12/17 155 lb 3.2 oz (70.4 kg)   RA  Physical Exam  Constitutional: She is oriented to person, place, and time and well-developed, well-nourished, and in no distress. No distress.  HENT:  Head: Normocephalic and atraumatic.  Right Ear: Hearing, tympanic membrane, external ear and ear canal normal.  Left Ear: Hearing, tympanic membrane, external ear and ear canal normal.  Nose: Nose normal. Right sinus exhibits no maxillary sinus tenderness and no frontal sinus tenderness. Left sinus exhibits no maxillary sinus tenderness and no frontal sinus tenderness.  Mouth/Throat: Uvula is midline and oropharynx is clear and moist. No oropharyngeal exudate.  Eyes: Pupils are equal, round, and reactive to light.  Neck: Normal range of motion. Neck supple.  Cardiovascular: Normal rate, regular rhythm and normal heart sounds.  Pulmonary/Chest: Effort normal and breath sounds normal. No accessory muscle usage. No respiratory distress. She has no decreased breath sounds. She has no wheezes. She has no rhonchi.  Musculoskeletal: Normal range of motion.        General: No edema.  Lymphadenopathy:    She has no cervical adenopathy.  Neurological: She is alert and oriented to person, place, and time. Gait normal.  Skin: Skin is warm and dry. She is not diaphoretic. No erythema.  Psychiatric: Mood, memory, affect and judgment normal.  Nursing note and vitals reviewed.     Lab Results:  CBC No results found for: WBC, RBC, HGB, HCT, PLT, MCV, MCH, MCHC, RDW, LYMPHSABS, MONOABS, EOSABS, BASOSABS  BMET No results found for: NA, K, CL, CO2, GLUCOSE, BUN, CREATININE, CALCIUM, GFRNONAA, GFRAA  BNP No results found for: BNP  ProBNP No results found for: PROBNP    Assessment & Plan:   Pleasant 72 year old female patient complaining follow-up with our office today.  Will trial patient on Stiolto Respimat inhaler.  While we are working with [facility] pharmacy to see what patient's cost would be for  nebulized [place] and [place] medications.  Discussed with patient that she can no longer use DuoNeb nebulized medications and she can use albuterol nebs which we have sent a prescription.  Completed handicap placard form.  Have also routed chest x-ray to primary care for follow up.   Follow-up in 6 weeks or sooner if symptoms worsen  COPD (chronic obstructive pulmonary disease) (HCC) Trial of Stiolto Respimat inhaler >>>2 puffs daily >>>Take this no matter what >>>This is not a rescue inhaler  Call us to let us know how you are doing with [place]   Call [facility] pharmacy regarding  [place] Bees and Performist  >>>[phone] >>>ask for estimate of cost   Placed an order for albuterol nebs which you can use as needed every 6 hours for sob or wheezing   Follow up in 6 weeks   This appointment was 34 minutes along with over 50% of the time direct face-to-face patient care, assessment, plan of care follow-up discussion of medications, and treatment of COPD.   Lauraine Rinne, NP 10/02/2018

## 2018-10-02 ENCOUNTER — Telehealth: Payer: Self-pay | Admitting: Pulmonary Disease

## 2018-10-02 ENCOUNTER — Encounter: Payer: Self-pay | Admitting: Pulmonary Disease

## 2018-10-02 ENCOUNTER — Ambulatory Visit (INDEPENDENT_AMBULATORY_CARE_PROVIDER_SITE_OTHER): Payer: Medicare Other | Admitting: Pulmonary Disease

## 2018-10-02 VITALS — BP 132/82 | HR 92 | Ht 62.0 in | Wt 158.0 lb

## 2018-10-02 DIAGNOSIS — J432 Centrilobular emphysema: Secondary | ICD-10-CM | POA: Diagnosis not present

## 2018-10-02 DIAGNOSIS — J410 Simple chronic bronchitis: Secondary | ICD-10-CM

## 2018-10-02 MED ORDER — ALBUTEROL SULFATE (2.5 MG/3ML) 0.083% IN NEBU: 2.5000 mg | INHALATION_SOLUTION | Freq: Four times a day (QID) | RESPIRATORY_TRACT | 3 refills | Status: DC | PRN

## 2018-10-02 MED ORDER — ALBUTEROL SULFATE HFA 108 (90 BASE) MCG/ACT IN AERS: 1.0000 | INHALATION_SPRAY | Freq: Four times a day (QID) | RESPIRATORY_TRACT | 3 refills | Status: DC | PRN

## 2018-10-02 MED ORDER — TIOTROPIUM BROMIDE-OLODATEROL 2.5-2.5 MCG/ACT IN AERS: 2.0000 | INHALATION_SPRAY | Freq: Every day | RESPIRATORY_TRACT | 0 refills | Status: DC

## 2018-10-02 NOTE — Progress Notes (Signed)
Patient seen in the office today and instructed on use of stiolto respimat.  Patient expressed understanding and demonstrated technique.

## 2018-10-02 NOTE — Telephone Encounter (Signed)
I am glad patient was able to obtain these nebulized medications.  Once patient starts daily Yupelri and Perfomist twice a day this will replace her Stiolto and she will stop using Stiolto.  Patient can continue to use albuterol nebulizers as needed every 6 hours for shortness of breath or wheezing.  Patient can use Stiolto sample until nebulized medications arrive.   Wyn Quaker FNP

## 2018-10-02 NOTE — Progress Notes (Signed)
Reviewed, agree 

## 2018-10-02 NOTE — Telephone Encounter (Signed)
Called and spoke with pt. Pt stated to me she was given a Stiolto inhaler to do a trial to see how she liked it. Pt also stated she received a call from [place] in regards to the yupelri and performist. Pt decided she would go ahead and have these two neb sol shipped to her address due to being told by [facility] pharmacy since she had met her deductible, this would be the best time to go ahead and have these meds shipped to her address.  Pt stated she should be receiving both meds by the end of next week. Pt wanted to know if she should continue taking the stiolto once she received the neb sol or what she should do at that point. Aaron Edelman, please advise on this for pt. Thanks!

## 2018-10-02 NOTE — Assessment & Plan Note (Signed)
Trial of Stiolto Respimat inhaler >>>2 puffs daily >>>Take this no matter what >>>This is not a rescue inhaler  Call us to let us know how you are doing with Stiolto   Call [facility] pharmacy regarding Yupelri and Performist  >>>[phone] >>>ask for estimate of cost   Placed an order for albuterol nebs which you can use as needed every 6 hours for sob or wheezing   Follow up in 6 weeks

## 2018-10-02 NOTE — Patient Instructions (Signed)
Trial of Stiolto Respimat inhaler >>>2 puffs daily >>>Take this no matter what >>>This is not a rescue inhaler  Call us to let us know how you are doing with Stiolto   Call [place] pharmacy regarding Yupelri and Performist  >>>[phone] >>>ask for estimate of cost   Placed an order for albuterol nebs which you can use as needed every 6 hours for sob or wheezing   Follow up in 6 weeks      It is flu season:   >>>Remember to be washing your hands regularly, using hand sanitizer, be careful to use around herself with has contact with people who are sick will increase her chances of getting sick yourself. >>> Best ways to protect herself from the flu: Receive the yearly flu vaccine, practice good hand hygiene washing with soap and also using hand sanitizer when available, eat a nutritious meals, get adequate rest, hydrate appropriately   Please contact the office if your symptoms worsen or you have concerns that you are not improving.   Thank you for choosing [facility] for your healthcare, and for allowing [place] to partner with you on your healthcare journey. I am thankful to be able to provide care to you today.   Wyn Quaker FNP-C

## 2018-10-02 NOTE — Telephone Encounter (Signed)
Attempted to call pt but unable to reach her. Left message for pt to return call. 

## 2018-10-03 NOTE — Telephone Encounter (Signed)
Patient returned call, CB is [phone].

## 2018-10-03 NOTE — Telephone Encounter (Signed)
Spoke with pt,aware of recs.  Nothing further needed.  

## 2018-10-03 NOTE — Telephone Encounter (Signed)
LMTCB

## 2018-10-03 NOTE — Telephone Encounter (Signed)
Patient returned calll, CB is [phone].

## 2018-10-22 ENCOUNTER — Telehealth: Payer: Self-pay | Admitting: Pulmonary Disease

## 2018-10-22 NOTE — Progress Notes (Addendum)
@Patient  ID: Yolanda Morris, female    DOB: December 26, 1945, 73 y.o.   MRN: [id number]  Chief Complaint  Patient presents with  . Follow-up    Medication changes    Referring provider: [name], MD  HPI:  73 year old female former smoker followed in our office for COPD   Smoker/ Smoking History: Former smoker.  Quit 1998.  50-pack-year smoking history. Maintenance:  Yupelri and Performist, prev failed: bevespi and anoro Pt of: Dr. Lake Bells  Former patient of Dr. Gwenette Greet with COPD  Completed pulmonary rehab 2014.   10/23/2018  - Visit   74 year old female former smoker presenting to our office today for an acute visit.  Patient reports that since starting nebulized medications (Perforomist and Yupelri) she has noticed an increased shortness of breath, fatigue.  Patient also reports that her shortness of breath is worse when laying flat.  She has had episodes of chest tightness which was resolved when she took her performance.  Patient denies acute symptoms such as fever, cough, chills, sinus pain or congestion.  Patient reports that she does not feel like she is having a COPD exacerbation.  Patient believes her breathing was better managed while she was on Anoro Ellipta.  October/2019 spirometry did show an FEV1 of 0.8 L.  MMRC - Breathlessness Score 3 - I stop for breath after walking about 100 yards or after a few minutes on level ground (isle at grocery store is 164ft)     Tests:  Spirometry: PFT's 2011:  FEV1 1.25 (54%), ratio 52, +airtrapping, DLCO 77% Spiro 06/2012:  FEV1 1.00 (43%), ratio 51% Spirometry October 2019 FEV1 0.8 L 38% predicted  08/16/2018-chest x-ray-COPD and bibasilar scarring, mild compression fracture mid thoracic vertebral body, new since 2014  08/15/2018-spirometry- FVC 1.5 (56%), ratio 52, FEV1 39  Ambulatory oximetry testing August 15, 2018: O2 saturation dropped to 90% while walking 500 feet on room air  FENO:  No results found for:  NITRICOXIDE  PFT: No flowsheet data found.  Imaging: Dg Chest 2 View  Result Date: 10/23/2018 CLINICAL DATA:  Dry cough. EXAM: CHEST - 2 VIEW COMPARISON:  08/15/2018. FINDINGS: Mediastinum and hilar structures normal. Mild bibasilar atelectasis and/or scarring again noted. No pleural effusion pneumothorax. Biapical pleural-parenchymal thickening noted consistent with scarring. Similar findings noted on prior exam. Stable upper thoracic vertebral body compression fractures. IMPRESSION: Mild bibasilar subsegmental atelectasis and/or scarring again noted. No acute pulmonary infiltrate. Electronically Signed   By: Marcello Moores  Register   On: 10/23/2018 12:44      Specialty Problems      Pulmonary Problems   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)    PFT's 2011:  FEV1 1.25 (54%), ratio 52, +airtrapping, DLCO 77% [name] 06/2012:  FEV1 1.00 (43%), ratio 51% Completed pulmonary rehab 2014.   08/15/2018-spirometry- FVC 1.5 (56%), ratio 52, FEV1 39       Acute bronchitis   Acute sinusitis   Cough   COPD exacerbation (HCC)   Shortness of breath      Allergies  Allergen Reactions  . Bacitracin     rash  . Levofloxacin     hives    Immunization History  Administered Date(s) Administered  . Influenza Split 06/18/2011, 07/10/2012, 08/17/2013  . Influenza, High Dose Seasonal PF 07/12/2017  . Influenza,inj,Quad PF,6+ Mos 07/07/2015, 07/11/2016  . Influenza-Unspecified 07/16/2014, 07/16/2018  . Pneumococcal Conjugate-13 12/18/2013  . Pneumococcal Polysaccharide-23 07/10/2012    Past Medical History:  Diagnosis Date  . COPD (chronic obstructive pulmonary disease) ([place])   .  DM (diabetes mellitus) ([place])   . Dyspnea    increased activity   . Genital herpes   . Hyperlipidemia   . Migraines   . Osteoporosis   . Unspecified essential hypertension     Tobacco History: Social History   Tobacco Use  Smoking Status Former Smoker  . Packs/day: 2.00  . Years: 25.00  . Pack years:  50.00  . Types: Cigarettes  . Last attempt to quit: 10/17/1996  . Years since quitting: 22.0  Smokeless Tobacco Never Used   Counseling given: Yes  Continue to not smoke  Outpatient Encounter Medications as of 10/23/2018  Medication Sig  . acetaminophen (TYLENOL) 325 MG tablet Take 650 mg by mouth every 6 (six) hours as needed.  Marland Kitchen albuterol (PROVENTIL HFA;VENTOLIN HFA) 108 (90 Base) MCG/ACT inhaler Inhale 2 puffs into the lungs every 6 (six) hours as needed for wheezing or shortness of breath.  Marland Kitchen albuterol (PROVENTIL) (2.5 MG/3ML) 0.083% nebulizer solution Take 3 mLs (2.5 mg total) by nebulization every 6 (six) hours as needed for wheezing or shortness of breath.  Marland Kitchen aspirin EC 81 MG EC tablet Take 81 mg by mouth daily.    . calcium carbonate (OS-CAL - DOSED IN MG OF ELEMENTAL CALCIUM) 1250 (500 Ca) MG tablet Take 1 tablet by mouth daily with breakfast.  . chlorpheniramine (CHLOR-TRIMETON) 4 MG tablet Take 4 mg by mouth daily as needed for allergies.  . Cholecalciferol (VITAMIN D3) 1000 UNITS CAPS Take 1 capsule by mouth daily.    . Cranberry 500 MG TABS Take 1 tablet by mouth daily.  . formoterol (PERFOROMIST) 20 MCG/2ML nebulizer solution Take 2 mLs (20 mcg total) by nebulization 2 (two) times daily.  Marland Kitchen losartan (COZAAR) 100 MG tablet Take 100 mg by mouth daily.   Marland Kitchen Lysine 500 MG CAPS Take 1 capsule by mouth daily.    . metFORMIN (GLUCOPHAGE-XR) 500 MG 24 hr tablet   . Multiple Vitamins-Minerals (CENTRUM SILVER PO) Take 1 tablet by mouth daily.    Marland Kitchen omeprazole (PRILOSEC) 20 MG capsule Take 20 mg by mouth every other day.   . Revefenacin (YUPELRI) 175 MCG/3ML SOLN Inhale 3 mLs into the lungs 2 (two) times daily.  . simvastatin (ZOCOR) 40 MG tablet Take 40 mg by mouth at bedtime.    Marland Kitchen Spacer/Aero-Holding Chambers (AEROCHAMBER Z-STAT PLUS) inhaler Use as instructed  . valACYclovir (VALTREX) 500 MG tablet Take 500 mg by mouth 2 (two) times daily as needed.    . Zoledronic Acid (RECLAST IV)  Inject into the vein. Every 12 months  . albuterol (PROAIR HFA) 108 (90 Base) MCG/ACT inhaler Inhale 1-2 puffs into the lungs every 6 (six) hours as needed for wheezing or shortness of breath. (Patient not taking: Reported on 10/23/2018)  . ANORO ELLIPTA 62.5-25 MCG/INH AEPB USE 1 INHALATION DAILY (Patient not taking: Reported on 10/23/2018)  . Glycopyrrolate-Formoterol (BEVESPI AEROSPHERE) 9-4.8 MCG/ACT AERO Inhale 2 puffs into the lungs 2 (two) times daily. (Patient not taking: Reported on 10/02/2018)  . Tiotropium Bromide-Olodaterol (STIOLTO RESPIMAT) 2.5-2.5 MCG/ACT AERS Inhale 2 puffs into the lungs daily. (Patient not taking: Reported on 10/23/2018)  . [DISCONTINUED] levocetirizine (XYZAL) 5 MG tablet Take 5 mg by mouth every evening.     No facility-administered encounter medications on file as of 10/23/2018.     Review of Systems  Review of Systems  Constitutional: Positive for activity change and fatigue. Negative for chills and fever.  HENT: Negative for congestion, postnasal drip, sinus pressure and sinus pain.  Respiratory: Positive for cough (dry cough ), chest tightness and shortness of breath (worse at night).   Cardiovascular: Negative for chest pain and palpitations.       +orthopnea  Gastrointestinal: Negative for diarrhea, nausea and vomiting.    Wells Criteria Modified Wells criteria: Clinical assessment for PE Clinical symptoms of DVT (leg swelling, pain with palpation) - 3 Other diagnoses less likely than pulmonary embolism - 3 Heart rate greater than 100 - 1.5 Immobilization (disease greater in 3 days) or surgery in the previous 4 weeks - 1.5 Previous DVT/PE - 1.5 Hemoptysis - 1 Malignancy - 1  Probability Traditional clinical probability assessment (Wells criteria) High - greater than 6 Moderate - 2 to 6 Low less than 2  Simplify clinical probability assessment (modified Wells criteria) PE likely-greater than 4 PE unlikely little less than or equal to  4   Physical Exam  BP 124/70 (BP Location: Left Arm, Cuff Size: Normal)   Pulse 93   Ht 5\' 2"  (1.575 m)   Wt 156 lb (70.8 kg)   SpO2 91%   BMI 28.53 kg/m   Wt Readings from Last 5 Encounters:  10/23/18 156 lb (70.8 kg)  10/02/18 158 lb (71.7 kg)  08/15/18 153 lb (69.4 kg)  05/31/18 152 lb (68.9 kg)  12/01/17 152 lb 6.4 oz (69.1 kg)    Physical Exam  Constitutional: She is oriented to person, place, and time and well-developed, well-nourished, and in no distress. Vital signs are normal. No distress.  HENT:  Head: Normocephalic and atraumatic.  Right Ear: Hearing, tympanic membrane, external ear and ear canal normal.  Left Ear: Hearing, tympanic membrane, external ear and ear canal normal.  Nose: Nose normal. Right sinus exhibits no maxillary sinus tenderness and no frontal sinus tenderness. Left sinus exhibits no maxillary sinus tenderness and no frontal sinus tenderness.  Mouth/Throat: Uvula is midline and oropharynx is clear and moist. No oropharyngeal exudate.  Eyes: Pupils are equal, round, and reactive to light.  Neck: Normal range of motion. Neck supple. No JVD present.  Cardiovascular: Normal rate, regular rhythm and normal heart sounds.  Pulmonary/Chest: Effort normal and breath sounds normal. No accessory muscle usage. No respiratory distress. She has no decreased breath sounds. She has no wheezes. She has no rhonchi.  +limited air movement through out all lobes, diminished in bases  +barrel chest  Musculoskeletal: Normal range of motion.        General: No edema.  Lymphadenopathy:    She has no cervical adenopathy.  Neurological: She is alert and oriented to person, place, and time. Gait normal.  Skin: Skin is warm and dry. She is not diaphoretic. No erythema.  Psychiatric: Mood, memory, affect and judgment normal.  Nursing note and vitals reviewed.     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) Assessment: Severe COPD based  off of October/2019 spirometry Patient appears deconditioned Barrel chest Limited air movement throughout all lobes No audible wheezing Diminished air movement in the bases left greater than right Wells criteria 0, low suspicion for PE Patient is not tachycardic, oxygen saturations are 91%, vitals are comparable to previous appointments  Plan: Stop Perforomist and Yupelri Chest x-ray today Lab work today -BNP and BMP Resume Anoro Ellipta Referral back to pulmonary rehab Follow-up with our office in 4 weeks     Shortness of breath Assessment: Worsening shortness of breath with medication change to Perforomist and Yupelri Wells score of 0, low suspicion for PE Oxygen saturations 91% Lungs clear but diminished breath sounds throughout Patient reports orthopnea and increased shortness of breath  Plan: Lab work- BNP and bmp Chest x-ray today Follow-up in 4 weeks      Lauraine Rinne, NP 10/23/2018   This appointment was 30 min long with over 50% of the time in direct face-to-face patient care, assessment, plan of care, and follow-up.

## 2018-10-22 NOTE — Telephone Encounter (Signed)
Called and spoke with Patient.  She stated that she started the perforomist and yupelri 9 days ago.  She does perforomist in the morning, followed by yupelri 30 minutes later, and performist in the evening (12 hrs from first dose).  She is using albuterol in the middle of the day for [facility].  She feels that her breathing has gotten worse since starting perforomist and yupelri.  Her next appointment is 11/13/18, and she is concerned if she should be seen before, or stop yupelri and perforomist. She is requesting suggestions.  Will route to Wyn Quaker, NP

## 2018-10-22 NOTE — Telephone Encounter (Signed)
Spoke with pt, aware of Brian's recs. Scheduled to see Aaron Edelman 10/23/2018 at 11:00 for evaluation and further discussion.  Nothing further needed at this time.

## 2018-10-22 NOTE — Telephone Encounter (Signed)
Pt can always have OV moved up. If patient would like to stop nebulized medications (Performist and Yupelri) and resume Anoro Ellipta she can.   Does she have any other symptoms? Fever? Wheezing? Etc. Could be a COPD exac at the same time of switching to nebs.   Difficult to say without an OV to evaluate.   Wyn Quaker FNP

## 2018-10-23 ENCOUNTER — Ambulatory Visit (INDEPENDENT_AMBULATORY_CARE_PROVIDER_SITE_OTHER): Payer: Medicare Other | Admitting: Pulmonary Disease

## 2018-10-23 ENCOUNTER — Encounter: Payer: Self-pay | Admitting: Pulmonary Disease

## 2018-10-23 ENCOUNTER — Other Ambulatory Visit (INDEPENDENT_AMBULATORY_CARE_PROVIDER_SITE_OTHER): Payer: Medicare Other

## 2018-10-23 ENCOUNTER — Ambulatory Visit (INDEPENDENT_AMBULATORY_CARE_PROVIDER_SITE_OTHER)
Admission: RE | Admit: 2018-10-23 | Discharge: 2018-10-23 | Disposition: A | Discharge status: Home / Self Care | Payer: Medicare Other | Source: Ambulatory Visit | Attending: Pulmonary Disease | Admitting: Pulmonary Disease

## 2018-10-23 VITALS — BP 124/70 | HR 93 | Ht 62.0 in | Wt 156.0 lb

## 2018-10-23 DIAGNOSIS — R0602 Shortness of breath: Secondary | ICD-10-CM | POA: Diagnosis not present

## 2018-10-23 DIAGNOSIS — J432 Centrilobular emphysema: Secondary | ICD-10-CM

## 2018-10-23 DIAGNOSIS — J984 Other disorders of lung: Secondary | ICD-10-CM | POA: Diagnosis not present

## 2018-10-23 LAB — BASIC METABOLIC PANEL
BUN: 12 mg/dL (ref 6–23)
CO2: 35 mEq/L — ABNORMAL HIGH (ref 19–32)
Calcium: 10.5 mg/dL (ref 8.4–10.5)
Chloride: 100 mEq/L (ref 96–112)
Creatinine, Ser: 0.66 mg/dL (ref 0.40–1.20)
GFR: 93.44 mL/min (ref >60.00)
Glucose, Bld: 96 mg/dL (ref 70–99)
POTASSIUM: 4.4 meq/L (ref 3.5–5.1)
Sodium: 141 mEq/L (ref 135–145)

## 2018-10-23 LAB — BRAIN NATRIURETIC PEPTIDE: Pro B Natriuretic peptide (BNP): 18 pg/mL (ref 0.0–100.0)

## 2018-10-23 NOTE — Progress Notes (Signed)
Labs are normal. Not holding on to fluid.  Wyn Quaker FNP

## 2018-10-23 NOTE — Progress Notes (Signed)
Chest x-ray results of come back.  These results are stable and comparable to previous chest x-ray.  No pneumonia or pleural effusion noted.  No changes in plan of care at this time  [place]

## 2018-10-23 NOTE — Patient Instructions (Addendum)
Chest x-ray today  Lab work today  Please proceed to the [employer] location go to the basement to receive your lab work and chest x-ray  Restart Anoro Ellipta  >>> Take 1 puff daily in the morning right when you wake up >>>Rinse your mouth out after use >>>This is a daily maintenance inhaler, NOT a rescue inhaler >>>Contact our office if you are having difficulties affording or obtaining this medication >>>It is important for you to be able to take this daily and not miss any doses  Please stop Yupelri and Perforomist at this time  Okay to continue using albuterol nebulized medications every 4-6 hours as needed for shortness of breath  Referral back to pulmonary rehab   Follow up in 4 weeks   It is flu season:   >>>Remember to be washing your hands regularly, using hand sanitizer, be careful to use around herself with has contact with people who are sick will increase her chances of getting sick yourself. >>> Best ways to protect herself from the flu: Receive the yearly flu vaccine, practice good hand hygiene washing with soap and also using hand sanitizer when available, eat a nutritious meals, get adequate rest, hydrate appropriately   Please contact the office if your symptoms worsen or you have concerns that you are not improving.   Thank you for choosing [facility] for your healthcare, and for allowing [place] to partner with you on your healthcare journey. I am thankful to be able to provide care to you today.   Wyn Quaker FNP-C

## 2018-10-23 NOTE — Assessment & Plan Note (Signed)
Assessment: Worsening shortness of breath with medication change to Perforomist and Yupelri Wells score of 0, low suspicion for PE Oxygen saturations 91% Lungs clear but diminished breath sounds throughout Patient reports orthopnea and increased shortness of breath  Plan: Lab work- BNP and bmp Chest x-ray today Follow-up in 4 weeks

## 2018-10-23 NOTE — Assessment & Plan Note (Addendum)
Assessment: Severe COPD based off of October/2019 spirometry Patient appears deconditioned Barrel chest Limited air movement throughout all lobes No audible wheezing Diminished air movement in the bases left greater than right Wells criteria 0, low suspicion for PE Patient is not tachycardic, oxygen saturations are 91%, vitals are comparable to previous appointments  Plan: Stop Perforomist and Yupelri Chest x-ray today Lab work today -BNP and BMP Resume Anoro Ellipta Referral back to pulmonary rehab Follow-up with our office in 4 weeks

## 2018-10-24 ENCOUNTER — Telehealth (HOSPITAL_COMMUNITY): Payer: Self-pay

## 2018-10-24 NOTE — Telephone Encounter (Signed)
Referral received from MD [facility] for Pulmonary Rehab with diagnosis of centrilobular emphysema. Clinical review of pt follow up appt on 10/23/18 Pulmonary office note with [facility] NP. Pt appropriate for scheduling for Pulmonary rehab.  Will forward to support staff for scheduling and verification of insurance eligibility/benefits with pt consent.   Joycelyn Man, RN, BSN Cardiac and Pulmonary Rehab Nurse

## 2018-11-02 ENCOUNTER — Telehealth (HOSPITAL_COMMUNITY): Payer: Self-pay

## 2018-11-02 NOTE — Telephone Encounter (Signed)
Called patient to see if she was interested in participating in the Pulmonary Rehab Program. Patient stated yes. Patient will come in for orientation on 11/09/2018 @ 930AM and will attend the 130PM exercise class. Went over insurance, patient verbalized understanding.  Mailed homework package.

## 2018-11-02 NOTE — Telephone Encounter (Signed)
Pt insurance is active and benefits verified through Medicare A/B. Co-pay $0.00, DED $198.00/$0.00 met, out of pocket $0.00/$0.00 met, co-insurance 0%. No pre-authorization required.  2ndary insurance is active and benefits verified through [employer]. Co-pay $0.00, DED $0.00/$0.00 met, out of pocket $0.00/$0.00 met, co-insurance 0%. No pre-authorization required.

## 2018-11-06 ENCOUNTER — Telehealth (HOSPITAL_COMMUNITY): Payer: Self-pay

## 2018-11-09 ENCOUNTER — Encounter (HOSPITAL_COMMUNITY): Payer: Self-pay

## 2018-11-09 ENCOUNTER — Encounter (HOSPITAL_COMMUNITY)
Admission: RE | Admit: 2018-11-09 | Discharge: 2018-11-09 | Disposition: A | Discharge status: Home / Self Care | Payer: Medicare Other | Source: Ambulatory Visit | Attending: Pulmonary Disease | Admitting: Pulmonary Disease

## 2018-11-09 VITALS — BP 125/56 | HR 81 | Temp 97.7°F | Resp 18 | Ht 62.75 in | Wt 158.7 lb

## 2018-11-09 DIAGNOSIS — J432 Centrilobular emphysema: Secondary | ICD-10-CM | POA: Diagnosis not present

## 2018-11-09 HISTORY — DX: Malignant (primary) neoplasm, unspecified: C80.1

## 2018-11-09 NOTE — Progress Notes (Signed)
Yolanda Morris 73 y.o. female Pulmonary Rehab Orientation Note Yolanda Morris was referred to pulmonary rehab by Dr. Lake Bells with the diagnosis of centrilobular emphysema. Patient arrived today in Cardiac and Pulmonary Rehab for orientation to Pulmonary Rehab. She walked in from [employer]. She does not carry portable oxygen. Per pt, she uses oxygen never. Color good, skin warm and dry. Patient is oriented to time and place. Patient's medical history, psychosocial health, and medications reviewed. Psychosocial assessment reveals pt lives alone with her dog. Pt is currently retired. Pt hobbies include reading, but she does state that she does not read as much as she used to before her husband got sick and then passed in December 2018. Pt reports her stress level is high. Areas of stress/anxiety include Health Finances.  Pt does exhibit signs of depression. Signs of depression include and difficulty falling asleep and difficulty maintaining sleep as well as overeating, feeling tired and having little energy, and trouble concentrating on things. PHQ2/9 score 0/6. Pt shows fair  coping skills with positive outlook. Pt seems to be trying to find her new "normal" since her husband has passed. With the time that he was sick and then passed she was his constant caregiver, so she is having to find ways to be more active and social. Will continue to monitor and evaluate progress toward psychosocial goal(s) of becoming more involved in activities that she is interested in while maintaining a positive outlook. Physical assessment reveals heart rate is normal, breath sounds clear to auscultation, no wheezes, rales, or rhonchi, diminished. Grip strength equal, strong. Distal pulses present. Patient reports she does take medications as prescribed. Patient states she follows a Regular diet, but states she does have a problem with over eating. The patient reports no specific efforts to gain or lose weight. Pt states she would like to  lose 10 pounds while she is in rehab. Patient's weight will be monitored closely. Demonstration and practice of PLB. Patient able to return demonstration satisfactorily. Safety and hand hygiene in the exercise area reviewed with patient. Patient voices understanding of the information reviewed. Department expectations discussed with patient and achievable goals were set. The patient shows enthusiasm about attending the program and we look forward to working with this nice lady. The patient is scheduled for a 6 min walk test on 11/15/18 at 1500 and to begin exercise on 11/22/18 with the 1:30 pm class.   [phone]  Joycelyn Man RN, BSN Cardiac and Pulmonary Rehab RN

## 2018-11-13 ENCOUNTER — Encounter: Payer: Self-pay | Admitting: Pulmonary Disease

## 2018-11-13 ENCOUNTER — Ambulatory Visit (INDEPENDENT_AMBULATORY_CARE_PROVIDER_SITE_OTHER): Payer: Medicare Other | Admitting: Pulmonary Disease

## 2018-11-13 VITALS — BP 128/62 | HR 76 | Ht 62.0 in | Wt 158.2 lb

## 2018-11-13 DIAGNOSIS — J410 Simple chronic bronchitis: Secondary | ICD-10-CM | POA: Diagnosis not present

## 2018-11-13 DIAGNOSIS — J309 Allergic rhinitis, unspecified: Secondary | ICD-10-CM | POA: Diagnosis not present

## 2018-11-13 MED ORDER — UMECLIDINIUM-VILANTEROL 62.5-25 MCG/INH IN AEPB: INHALATION_SPRAY | RESPIRATORY_TRACT | 6 refills | Status: DC

## 2018-11-13 NOTE — Progress Notes (Signed)
Reviewed, agree 

## 2018-11-13 NOTE — Assessment & Plan Note (Signed)
Assessment: COPD 3B mMRC 3 today Planning on starting pulmonary rehab next week Patient maintained well on Anoro Ellipta and patient prefers this inhaler Lungs clear to auscultation today Patient with dry cough likely related to allergies  Plan: Continue Anoro Ellipta inhaler Continue forward with pulmonary rehab Follow-up with our office in 3 months

## 2018-11-13 NOTE — Progress Notes (Signed)
@Patient  ID: Yolanda Morris, female    DOB: 1946/01/14, 73 y.o.   MRN: [id number]  Chief Complaint  Patient presents with  . Follow-up    COPD follow-up    Referring provider: [name], MD  HPI:  73 year old female former smoker followed in our office for COPD  Smoker/ Smoking History: Former smoker.  Quit 1998.  50-pack-year smoking history. Maintenance:  Anoro Ellipta Pt of: Dr. Lake Bells  Former patient of Dr. Gwenette Greet with COPD  Completed pulmonary rehab 2014.   11/13/2018  - Visit   73 year old female former smoker presenting to office today for COPD follow-up.  Patient reports that she is loving using her Anoro Ellipta inhaler.  Patient uses her rescue inhaler/albuterol nebs 1 time daily.  Patient reports that she mainly uses albuterol nebs once daily.  Patient reports her shortness of breath is still back at baseline which averages around and mMRC 3.  Patient reports she did have multiple episodes should she was having difficulty doing her laundry this week where she was short of breath because the washing machine Stopped and she had to lift out heavy wet blankets and towels.  Patient has been set up with pulmonary rehab and plans on starting next week.  MMRC - Breathlessness Score 3 - I stop for breath after walking about 100 yards or after a few minutes on level ground (isle at grocery store is 14ft)    Tests:  Spirometry: PFT's 2011:  FEV1 1.25 (54%), ratio 52, +airtrapping, DLCO 77% Spiro 06/2012:  FEV1 1.00 (43%), ratio 51% Spirometry October 2019 FEV1 0.8 L 38% predicted  08/16/2018-chest x-ray-COPD and bibasilar scarring, mild compression fracture mid thoracic vertebral body, new since 2014  08/15/2018-spirometry- FVC 1.5 (56%), ratio 52, FEV1 39  Ambulatory oximetry testing August 15, 2018: O2 saturation dropped to 90% while walking 500 feet on room air  FENO:  No results found for: NITRICOXIDE  PFT: No flowsheet data found.  Imaging: Dg Chest 2  View  Result Date: 10/23/2018 CLINICAL DATA:  Dry cough. EXAM: CHEST - 2 VIEW COMPARISON:  08/15/2018. FINDINGS: Mediastinum and hilar structures normal. Mild bibasilar atelectasis and/or scarring again noted. No pleural effusion pneumothorax. Biapical pleural-parenchymal thickening noted consistent with scarring. Similar findings noted on prior exam. Stable upper thoracic vertebral body compression fractures. IMPRESSION: Mild bibasilar subsegmental atelectasis and/or scarring again noted. No acute pulmonary infiltrate. Electronically Signed   By: Marcello Moores  Register   On: 10/23/2018 12:44      Specialty Problems      Pulmonary Problems   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)    PFT's 2011:  FEV1 1.25 (54%), ratio 52, +airtrapping, DLCO 77% [name] 06/2012:  FEV1 1.00 (43%), ratio 51% Completed pulmonary rehab 2014.   08/15/2018-spirometry- FVC 1.5 (56%), ratio 52, FEV1 39       Acute bronchitis   Acute sinusitis   Cough   COPD exacerbation (HCC)   Shortness of breath   Allergic rhinitis      Allergies  Allergen Reactions  . Bacitracin     rash  . Levofloxacin     hives    Immunization History  Administered Date(s) Administered  . Influenza Split 06/18/2011, 07/10/2012, 08/17/2013  . Influenza, High Dose Seasonal PF 07/12/2017  . Influenza,inj,Quad PF,6+ Mos 07/07/2015, 07/11/2016  . Influenza-Unspecified 07/16/2014, 07/16/2018  . Pneumococcal Conjugate-13 12/18/2013  . Pneumococcal Polysaccharide-23 07/10/2012    Past Medical History:  Diagnosis Date  . Cancer ([place])    skin  . COPD (  chronic obstructive pulmonary disease) ([place])   . DM (diabetes mellitus) ([place])   . Dyspnea    increased activity   . Genital herpes   . Hyperlipidemia   . Migraines   . Osteoporosis   . Unspecified essential hypertension     Tobacco History: Social History   Tobacco Use  Smoking Status Former Smoker  . Packs/day: 2.00  . Years: 25.00  . Pack years: 50.00  . Types:  Cigarettes  . Last attempt to quit: 10/17/1996  . Years since quitting: 22.0  Smokeless Tobacco Never Used   Counseling given: Yes  Continue to not smoke  Outpatient Encounter Medications as of 11/13/2018  Medication Sig  . acetaminophen (TYLENOL) 325 MG tablet Take 650 mg by mouth every 6 (six) hours as needed.  Marland Kitchen albuterol (PROAIR HFA) 108 (90 Base) MCG/ACT inhaler Inhale 1-2 puffs into the lungs every 6 (six) hours as needed for wheezing or shortness of breath.  Marland Kitchen albuterol (PROVENTIL HFA;VENTOLIN HFA) 108 (90 Base) MCG/ACT inhaler Inhale 2 puffs into the lungs every 6 (six) hours as needed for wheezing or shortness of breath.  Marland Kitchen albuterol (PROVENTIL) (2.5 MG/3ML) 0.083% nebulizer solution Take 3 mLs (2.5 mg total) by nebulization every 6 (six) hours as needed for wheezing or shortness of breath.  Marland Kitchen aspirin EC 81 MG EC tablet Take 81 mg by mouth daily.    . calcium carbonate (OS-CAL - DOSED IN MG OF ELEMENTAL CALCIUM) 1250 (500 Ca) MG tablet Take 1 tablet by mouth daily with breakfast.  . chlorpheniramine (CHLOR-TRIMETON) 4 MG tablet Take 4 mg by mouth daily as needed for allergies.  . Cholecalciferol (VITAMIN D3) 1000 UNITS CAPS Take 1 capsule by mouth daily.    . Cranberry 500 MG TABS Take 1 tablet by mouth daily.  . formoterol (PERFOROMIST) 20 MCG/2ML nebulizer solution Take 2 mLs (20 mcg total) by nebulization 2 (two) times daily.  Marland Kitchen losartan (COZAAR) 100 MG tablet Take 100 mg by mouth daily.   Marland Kitchen Lysine 500 MG CAPS Take 1 capsule by mouth daily.    . metFORMIN (GLUCOPHAGE-XR) 500 MG 24 hr tablet   . Multiple Vitamins-Minerals (CENTRUM SILVER PO) Take 1 tablet by mouth daily.    Marland Kitchen omeprazole (PRILOSEC) 20 MG capsule Take 20 mg by mouth every other day.   . Revefenacin (YUPELRI) 175 MCG/3ML SOLN Inhale 3 mLs into the lungs 2 (two) times daily.  . simvastatin (ZOCOR) 40 MG tablet Take 40 mg by mouth at bedtime.    Marland Kitchen Spacer/Aero-Holding Chambers (AEROCHAMBER Z-STAT PLUS) inhaler Use as  instructed  . umeclidinium-vilanterol (ANORO ELLIPTA) 62.5-25 MCG/INH AEPB USE 1 INHALATION DAILY  . valACYclovir (VALTREX) 500 MG tablet Take 500 mg by mouth 2 (two) times daily as needed.    . Zoledronic Acid (RECLAST IV) Inject into the vein. Every 12 months  . [DISCONTINUED] ANORO ELLIPTA 62.5-25 MCG/INH AEPB USE 1 INHALATION DAILY  . [DISCONTINUED] Glycopyrrolate-Formoterol (BEVESPI AEROSPHERE) 9-4.8 MCG/ACT AERO Inhale 2 puffs into the lungs 2 (two) times daily.  . [DISCONTINUED] Tiotropium Bromide-Olodaterol (STIOLTO RESPIMAT) 2.5-2.5 MCG/ACT AERS Inhale 2 puffs into the lungs daily.  . [DISCONTINUED] levocetirizine (XYZAL) 5 MG tablet Take 5 mg by mouth every evening.     No facility-administered encounter medications on file as of 11/13/2018.      Review of Systems  Review of Systems  Constitutional: Positive for fatigue (occasionally). Negative for chills, fever and unexpected weight change.  HENT: Negative for congestion, ear pain, postnasal drip, sinus pressure and  sinus pain.   Respiratory: Positive for chest tightness (occasionally ) and shortness of breath (with exertion). Negative for cough and wheezing.   Cardiovascular: Negative for chest pain and palpitations.  Gastrointestinal: Negative for diarrhea, nausea and vomiting.  Skin: Negative for color change.  Allergic/Immunologic: Negative for environmental allergies and food allergies.  Neurological: Negative for dizziness, light-headedness and headaches.  Psychiatric/Behavioral: Negative for dysphoric mood. The patient is not nervous/anxious.   All other systems reviewed and are negative.    Physical Exam  BP 128/62 (BP Location: Left Arm, Cuff Size: Normal)   Pulse 76   Ht 5\' 2"  (1.575 m)   Wt 158 lb 3.2 oz (71.8 kg)   SpO2 93%   BMI 28.94 kg/m   Wt Readings from Last 5 Encounters:  11/13/18 158 lb 3.2 oz (71.8 kg)  11/09/18 158 lb 11.7 oz (72 kg)  10/23/18 156 lb (70.8 kg)  10/02/18 158 lb (71.7 kg)    08/15/18 153 lb (69.4 kg)   Physical Exam  Constitutional: She is oriented to person, place, and time and well-developed, well-nourished, and in no distress. No distress.  HENT:  Head: Normocephalic and atraumatic.  Right Ear: Hearing, tympanic membrane, external ear and ear canal normal.  Left Ear: Hearing, tympanic membrane, external ear and ear canal normal.  Nose: Mucosal edema present. Right sinus exhibits no maxillary sinus tenderness and no frontal sinus tenderness. Left sinus exhibits no maxillary sinus tenderness and no frontal sinus tenderness.  Mouth/Throat: Uvula is midline and oropharynx is clear and moist. No oropharyngeal exudate.  + Postnasal drip  Eyes: Pupils are equal, round, and reactive to light.  Neck: Normal range of motion. Neck supple. No JVD present.  Cardiovascular: Normal rate, regular rhythm and normal heart sounds.  Pulmonary/Chest: Effort normal and breath sounds normal. No accessory muscle usage. No respiratory distress. She has no decreased breath sounds. She has no wheezes. She has no rhonchi.  Abdominal: Soft. Bowel sounds are normal. There is no abdominal tenderness.  Musculoskeletal: Normal range of motion.        General: No edema.  Lymphadenopathy:    She has no cervical adenopathy.  Neurological: She is alert and oriented to person, place, and time. Gait normal.  Skin: Skin is warm and dry. She is not diaphoretic. No erythema.  Psychiatric: Mood, memory, affect and judgment normal.  Nursing note and vitals reviewed.    Lab Results:  CBC No results found for: WBC, RBC, HGB, HCT, PLT, MCV, MCH, MCHC, RDW, LYMPHSABS, MONOABS, EOSABS, BASOSABS  BMET    Component Value Date/Time   NA 141 10/23/2018 1226   K 4.4 10/23/2018 1226   CL 100 10/23/2018 1226   CO2 35 (H) 10/23/2018 1226   GLUCOSE 96 10/23/2018 1226   BUN 12 10/23/2018 1226   CREATININE 0.66 10/23/2018 1226   CALCIUM 10.5 10/23/2018 1226    BNP No results found for:  BNP  ProBNP    Component Value Date/Time   PROBNP 18.0 10/23/2018 1226      Assessment & Plan:     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) Assessment: COPD 3B mMRC 3 today Planning on starting pulmonary rehab next week Patient maintained well on Anoro Ellipta and patient prefers this inhaler Lungs clear to auscultation today Patient with dry cough likely related to allergies  Plan: Continue Anoro Ellipta inhaler Continue forward with pulmonary rehab Follow-up with our office in 3 months  Allergic rhinitis Assessment: Mucosal edema Postnasal drip Patient reporting  dry cough over the last 2 to 3 weeks  Plan: Can start daily antihistamine such as Zyrtec as patient reports that she feels like Zyrtec works better for her than Chlortab's Patient can also start Flonase 1 spray each nostril as needed for nasal congestion and allergies     Lauraine Rinne, NP 11/13/2018   This appointment was 28 min long with over 50% of the time in direct face-to-face patient care, assessment, plan of care, and follow-up.

## 2018-11-13 NOTE — Assessment & Plan Note (Signed)
Assessment: Mucosal edema Postnasal drip Patient reporting dry cough over the last 2 to 3 weeks  Plan: Can start daily antihistamine such as Zyrtec as patient reports that she feels like Zyrtec works better for her than Chlortab's Patient can also start Flonase 1 spray each nostril as needed for nasal congestion and allergies

## 2018-11-13 NOTE — Patient Instructions (Signed)
Continue Anoro Ellipta  >>> Take 1 puff daily in the morning right when you wake up >>>Rinse your mouth out after use >>>This is a daily maintenance inhaler, NOT a rescue inhaler >>>Contact our office if you are having difficulties affording or obtaining this medication >>>It is important for you to be able to take this daily and not miss any doses   Please start taking a daily antihistamine:  >>>choose one of: zyrtec, claritin, allegra, or xyzal  >>>these are over the counter medications  >>>can choose generic option  >>>take daily  >>>this medication helps with allergies, post nasal drip, and cough   Okay to stop chlor tabs if you start a daily antihistamine listed above  Flonase or fluticasone nasal spray 1 spray each nostril as needed for nasal congestion and allergy symptoms  Try purchasing both of these medications over-the-counter can get generic, check [employer]  Continue forward with pulmonary rehab    Follow-up with our office in 3 months with Dr. Lake Bells preferably, if nothing available okay to see Wyn Quaker, NP   It is flu season:   >>>Remember to be washing your hands regularly, using hand sanitizer, be careful to use around herself with has contact with people who are sick will increase her chances of getting sick yourself. >>> Best ways to protect herself from the flu: Receive the yearly flu vaccine, practice good hand hygiene washing with soap and also using hand sanitizer when available, eat a nutritious meals, get adequate rest, hydrate appropriately   Please contact the office if your symptoms worsen or you have concerns that you are not improving.   Thank you for choosing [facility] for your healthcare, and for allowing [place] to partner with you on your healthcare journey. I am thankful to be able to provide care to you today.   Wyn Quaker FNP-C

## 2018-11-15 ENCOUNTER — Encounter (HOSPITAL_COMMUNITY)
Admission: RE | Admit: 2018-11-15 | Discharge: 2018-11-15 | Disposition: A | Discharge status: Home / Self Care | Payer: Medicare Other | Source: Ambulatory Visit | Attending: Pulmonary Disease | Admitting: Pulmonary Disease

## 2018-11-15 DIAGNOSIS — J432 Centrilobular emphysema: Secondary | ICD-10-CM

## 2018-11-15 NOTE — Progress Notes (Signed)
Pulmonary Individual Treatment Plan  Patient Details  Name: Yolanda Morris MRN: [id number] Date of Birth: Aug 12, 1946 Referring Provider:     Pulmonary Rehab Walk Test from 11/15/2018 in [facility]  Referring Provider  mcquaid      Initial Encounter Date:    Pulmonary Rehab Walk Test from 11/15/2018 in [facility]  Date  11/15/18      Visit Diagnosis: Centrilobular emphysema ([place])  Patient's Home Medications on Admission:   Current Outpatient Medications:  .  acetaminophen (TYLENOL) 325 MG tablet, Take 650 mg by mouth every 6 (six) hours as needed., Disp: , Rfl:  .  albuterol (PROAIR HFA) 108 (90 Base) MCG/ACT inhaler, Inhale 1-2 puffs into the lungs every 6 (six) hours as needed for wheezing or shortness of breath., Disp: 1 Inhaler, Rfl: 3 .  albuterol (PROVENTIL HFA;VENTOLIN HFA) 108 (90 Base) MCG/ACT inhaler, Inhale 2 puffs into the lungs every 6 (six) hours as needed for wheezing or shortness of breath., Disp: 3 Inhaler, Rfl: 3 .  albuterol (PROVENTIL) (2.5 MG/3ML) 0.083% nebulizer solution, Take 3 mLs (2.5 mg total) by nebulization every 6 (six) hours as needed for wheezing or shortness of breath., Disp: 360 mL, Rfl: 3 .  aspirin EC 81 MG EC tablet, Take 81 mg by mouth daily.  , Disp: , Rfl:  .  calcium carbonate (OS-CAL - DOSED IN MG OF ELEMENTAL CALCIUM) 1250 (500 Ca) MG tablet, Take 1 tablet by mouth daily with breakfast., Disp: , Rfl:  .  chlorpheniramine (CHLOR-TRIMETON) 4 MG tablet, Take 4 mg by mouth daily as needed for allergies., Disp: , Rfl:  .  Cholecalciferol (VITAMIN D3) 1000 UNITS CAPS, Take 1 capsule by mouth daily.  , Disp: , Rfl:  .  Cranberry 500 MG TABS, Take 1 tablet by mouth daily., Disp: , Rfl:  .  formoterol (PERFOROMIST) 20 MCG/2ML nebulizer solution, Take 2 mLs (20 mcg total) by nebulization 2 (two) times daily., Disp: 540 mL, Rfl: 3 .  losartan (COZAAR) 100 MG tablet, Take 100 mg by mouth daily.  , Disp: , Rfl:  .  Lysine 500 MG CAPS, Take 1 capsule by mouth daily.  , Disp: , Rfl:  .  metFORMIN (GLUCOPHAGE-XR) 500 MG 24 hr tablet, , Disp: , Rfl:  .  Multiple Vitamins-Minerals (CENTRUM SILVER PO), Take 1 tablet by mouth daily.  , Disp: , Rfl:  .  omeprazole (PRILOSEC) 20 MG capsule, Take 20 mg by mouth every other day. , Disp: , Rfl:  .  Revefenacin (YUPELRI) 175 MCG/3ML SOLN, Inhale 3 mLs into the lungs 2 (two) times daily., Disp: 180 mL, Rfl: 0 .  simvastatin (ZOCOR) 40 MG tablet, Take 40 mg by mouth at bedtime.  , Disp: , Rfl:  .  Spacer/Aero-Holding Chambers (AEROCHAMBER Z-STAT PLUS) inhaler, Use as instructed, Disp: 1 each, Rfl: 0 .  umeclidinium-vilanterol (ANORO ELLIPTA) 62.5-25 MCG/INH AEPB, USE 1 INHALATION DAILY, Disp: 30 each, Rfl: 6 .  valACYclovir (VALTREX) 500 MG tablet, Take 500 mg by mouth 2 (two) times daily as needed.  , Disp: , Rfl:  .  Zoledronic Acid (RECLAST IV), Inject into the vein. Every 12 months, Disp: , Rfl:   Past Medical History: Past Medical History:  Diagnosis Date  . Cancer ([place])    skin  . COPD (chronic obstructive pulmonary disease) ([place])   . DM (diabetes mellitus) ([place])   . Dyspnea    increased activity   . Genital herpes   .  Hyperlipidemia   . Migraines   . Osteoporosis   . Unspecified essential hypertension     Tobacco Use: Social History   Tobacco Use  Smoking Status Former Smoker  . Packs/day: 2.00  . Years: 25.00  . Pack years: 50.00  . Types: Cigarettes  . Last attempt to quit: 10/17/1996  . Years since quitting: 22.0  Smokeless Tobacco Never Used    Labs: Recent Review Flowsheet Data    There is no flowsheet data to display.      Capillary Blood Glucose: No results found for: GLUCAP   Pulmonary Assessment Scores: Pulmonary Assessment Scores    Row Name 11/09/18 1055 11/15/18 1631       ADL UCSD   ADL Phase  Entry  -    SOB Score total  36  -      CAT Score   CAT Score  20  -      mMRC Score   mMRC Score   -  1       Pulmonary Function Assessment: Pulmonary Function Assessment - 11/09/18 1053      Breath   Bilateral Breath Sounds  Clear;Decreased    Shortness of Breath  Yes;Panic with Shortness of Breath       Exercise Target Goals: Exercise Program Goal: Individual exercise prescription set using results from initial 6 min walk test and THRR while considering  patient's activity barriers and safety.   Exercise Prescription Goal: Initial exercise prescription builds to 30-45 minutes a day of aerobic activity, 2-3 days per week.  Home exercise guidelines will be given to patient during program as part of exercise prescription that the participant will acknowledge.  Activity Barriers & Risk Stratification: Activity Barriers & Cardiac Risk Stratification - 11/09/18 1038      Activity Barriers & Cardiac Risk Stratification   Activity Barriers  Back Problems;Deconditioning;Neck/Spine Problems;Shortness of Breath;History of Falls;Balance Concerns       6 Minute Walk: 6 Minute Walk    Row Name 11/15/18 1556         6 Minute Walk   Phase  Initial     Distance  1200 feet     Walk Time  6 minutes     # of Rest Breaks  0     RPE  12     Perceived Dyspnea   2     Symptoms  No     Resting HR  85 bpm     Resting BP  128/90     Resting Oxygen Saturation   91 %     Exercise Oxygen Saturation  during 6 min walk  87 %     Max Ex. HR  128 bpm     Max Ex. BP  144/90       Interval HR   1 Minute HR  92     2 Minute HR  95     3 Minute HR  122     4 Minute HR  112     5 Minute HR  128     6 Minute HR  120     2 Minute Post HR  114     Interval Heart Rate?  Yes       Interval Oxygen   Interval Oxygen?  Yes     Baseline Oxygen Saturation %  91 %     1 Minute Oxygen Saturation %  89 %     1 Minute Liters of Oxygen  0 L     2 Minute Oxygen Saturation %  88 %     2 Minute Liters of Oxygen  0 L     3 Minute Oxygen Saturation %  91 %     3 Minute Liters of Oxygen  0 L     4  Minute Oxygen Saturation %  87 %     4 Minute Liters of Oxygen  0 L     5 Minute Oxygen Saturation %  88 %     5 Minute Liters of Oxygen  0 L     6 Minute Oxygen Saturation %  87 %     6 Minute Liters of Oxygen  0 L     2 Minute Post Oxygen Saturation %  92 %     2 Minute Post Liters of Oxygen  0 L        Oxygen Initial Assessment: Oxygen Initial Assessment - 11/15/18 1631      Home Oxygen   Home Oxygen Device  None      Initial 6 min Walk   Oxygen Used  None      Program Oxygen Prescription   Program Oxygen Prescription  None   patient desaturated to 87% during 6MWT-will re-evaluate during exercise      Oxygen Re-Evaluation:   Oxygen Discharge (Final Oxygen Re-Evaluation):   Initial Exercise Prescription: Initial Exercise Prescription - 11/15/18 1500      Date of Initial Exercise RX and Referring Provider   Date  11/15/18    Referring Provider  mcquaid      Recumbant Bike   Level  1    Watts  10    Minutes  17      NuStep   Level  2    SPM  80    Minutes  17    METs  1.5      Track   Laps  10    Minutes  17      Prescription Details   Frequency (times per week)  2    Duration  Progress to 45 minutes of aerobic exercise without signs/symptoms of physical distress      Intensity   THRR 40-80% of Max Heartrate  59-118    Ratings of Perceived Exertion  11-15    Perceived Dyspnea  0-4      Resistance Training   Training Prescription  Yes    Weight  orange bands    Reps  10-15       Perform Capillary Blood Glucose checks as needed.  Exercise Prescription Changes:   Exercise Comments:   Exercise Goals and Review: Exercise Goals    Row Name 11/09/18 1042             Exercise Goals   Increase Physical Activity  Yes       Intervention  Provide advice, education, support and counseling about physical activity/exercise needs.;Develop an individualized exercise prescription for aerobic and resistive training based on initial evaluation  findings, risk stratification, comorbidities and participant's personal goals.       Expected Outcomes  Short Term: Attend rehab on a regular basis to increase amount of physical activity.;Long Term: Add in home exercise to make exercise part of routine and to increase amount of physical activity.;Long Term: Exercising regularly at least 3-5 days a week.       Increase Strength and Stamina  Yes       Intervention  Provide advice, education, support  and counseling about physical activity/exercise needs.;Develop an individualized exercise prescription for aerobic and resistive training based on initial evaluation findings, risk stratification, comorbidities and participant's personal goals.       Expected Outcomes  Short Term: Increase workloads from initial exercise prescription for resistance, speed, and METs.;Short Term: Perform resistance training exercises routinely during rehab and add in resistance training at home;Long Term: Improve cardiorespiratory fitness, muscular endurance and strength as measured by increased METs and functional capacity (6MWT)       Able to understand and use rate of perceived exertion (RPE) scale  Yes       Intervention  Provide education and explanation on how to use RPE scale       Expected Outcomes  Short Term: Able to use RPE daily in rehab to express subjective intensity level;Long Term:  Able to use RPE to guide intensity level when exercising independently       Able to understand and use Dyspnea scale  Yes       Intervention  Provide education and explanation on how to use Dyspnea scale       Expected Outcomes  Short Term: Able to use Dyspnea scale daily in rehab to express subjective sense of shortness of breath during exertion;Long Term: Able to use Dyspnea scale to guide intensity level when exercising independently       Knowledge and understanding of Target Heart Rate Range (THRR)  Yes       Intervention  Provide education and explanation of THRR including how  the numbers were predicted and where they are located for reference       Expected Outcomes  Short Term: Able to state/look up THRR;Long Term: Able to use THRR to govern intensity when exercising independently;Short Term: Able to use daily as guideline for intensity in rehab       Understanding of Exercise Prescription  Yes       Intervention  Provide education, explanation, and written materials on patient's individual exercise prescription       Expected Outcomes  Short Term: Able to explain program exercise prescription;Long Term: Able to explain home exercise prescription to exercise independently          Exercise Goals Re-Evaluation :   Discharge Exercise Prescription (Final Exercise Prescription Changes):   Nutrition:  Target Goals: Understanding of nutrition guidelines, daily intake of sodium 1500mg , cholesterol 200mg , calories 30% from fat and 7% or less from saturated fats, daily to have 5 or more servings of fruits and vegetables.  Biometrics: Pre Biometrics - 11/09/18 1047      Pre Biometrics   Grip Strength  21 kg        Nutrition Therapy Plan and Nutrition Goals:   Nutrition Assessments:   Nutrition Goals Re-Evaluation:   Nutrition Goals Discharge (Final Nutrition Goals Re-Evaluation):   Psychosocial: Target Goals: Acknowledge presence or absence of significant depression and/or stress, maximize coping skills, provide positive support system. Participant is able to verbalize types and ability to use techniques and skills needed for reducing stress and depression.  Initial Review & Psychosocial Screening: Initial Psych Review & Screening - 11/09/18 1205      Initial Review   Comments  pt husband passed in 09/2017 and pt states it has been an adjustment and stressful dealing with health issues by herself as well and rebudgeting for a one income house.      Family Dynamics   Comments  pt husband passed in 09/2017. pt now lives alone with dog  who also has  medical issues.       Screening Interventions   Interventions  To provide support and resources with identified psychosocial needs       Quality of Life Scores:  Scores of 19 and below usually indicate a poorer quality of life in these areas.  A difference of  2-3 points is a clinically meaningful difference.  A difference of 2-3 points in the total score of the Quality of Life Index has been associated with significant improvement in overall quality of life, self-image, physical symptoms, and general health in studies assessing change in quality of life.  PHQ-9: Recent Review Flowsheet Data    Depression screen [facility] 2/9 11/09/2018   Decreased Interest 0   Down, Depressed, Hopeless 0   PHQ - 2 Score 0   Altered sleeping 0   Tired, decreased energy 1   Change in appetite 3   Feeling bad or failure about yourself  0   Trouble concentrating 2   Moving slowly or fidgety/restless 0   Suicidal thoughts 0   PHQ-9 Score 6   Difficult doing work/chores Somewhat difficult     Interpretation of Total Score  Total Score Depression Severity:  1-4 = Minimal depression, 5-9 = Mild depression, 10-14 = Moderate depression, 15-19 = Moderately severe depression, 20-27 = Severe depression   Psychosocial Evaluation and Intervention: Psychosocial Evaluation - 11/09/18 1212      Psychosocial Evaluation & Interventions   Comments  Pt recently lost her husband and has been adjusting to life and income without him. pt also has a dog that is older in age and has some health problems but she states she is cherishing her time with him. Pt does have a sister who she maintains close contact with daily and who checks on her often. Pt likes to read, but states that doesnt as much as before her husband passed, but she watched movies and tv every evening with her dog which she enjoys.    Expected Outcomes  pt will become more involved in things that she used to participate in before her husband got sick. States she  wants to read more, especially a book that was given to her by her neice.    Continue Psychosocial Services   Follow up required by staff       Psychosocial Re-Evaluation:   Psychosocial Discharge (Final Psychosocial Re-Evaluation):   Education: Education Goals: Education classes will be provided on a weekly basis, covering required topics. Participant will state understanding/return demonstration of topics presented.  Learning Barriers/Preferences: Learning Barriers/Preferences - 11/09/18 1104      Learning Barriers/Preferences   Learning Barriers  Sight    Learning Preferences  Audio;Computer/Internet;Group Instruction;Individual Instruction;Pictoral;Skilled Demonstration;Verbal Instruction;Video;Written Material       Education Topics: Risk Factor Reduction:  -Group instruction that is supported by a PowerPoint presentation. Instructor discusses the definition of a risk factor, different risk factors for pulmonary disease, and how the heart and lungs work together.     Nutrition for Pulmonary Patient:  -Group instruction provided by PowerPoint slides, verbal discussion, and written materials to support subject matter. The instructor gives an explanation and review of healthy diet recommendations, which includes a discussion on weight management, recommendations for fruit and vegetable consumption, as well as protein, fluid, caffeine, fiber, sodium, sugar, and alcohol. Tips for eating when patients are short of breath are discussed.   Pursed Lip Breathing:  -Group instruction that is supported by demonstration and informational handouts. Instructor discusses the  benefits of pursed lip and diaphragmatic breathing and detailed demonstration on how to preform both.     Oxygen Safety:  -Group instruction provided by PowerPoint, verbal discussion, and written material to support subject matter. There is an overview of "What is Oxygen" and "Why do we need it".  Instructor also  reviews how to create a safe environment for oxygen use, the importance of using oxygen as prescribed, and the risks of noncompliance. There is a brief discussion on traveling with oxygen and resources the patient may utilize.   Oxygen Equipment:  -Group instruction provided by [facility] Staff utilizing handouts, written materials, and equipment demonstrations.   Signs and Symptoms:  -Group instruction provided by written material and verbal discussion to support subject matter. Warning signs and symptoms of infection, stroke, and heart attack are reviewed and when to call the physician/911 reinforced. Tips for preventing the spread of infection discussed.   Advanced Directives:  -Group instruction provided by verbal instruction and written material to support subject matter. Instructor reviews Advanced Directive laws and proper instruction for filling out document.   Pulmonary Video:  -Group video education that reviews the importance of medication and oxygen compliance, exercise, good nutrition, pulmonary hygiene, and pursed lip and diaphragmatic breathing for the pulmonary patient.   Exercise for the Pulmonary Patient:  -Group instruction that is supported by a PowerPoint presentation. Instructor discusses benefits of exercise, core components of exercise, frequency, duration, and intensity of an exercise routine, importance of utilizing pulse oximetry during exercise, safety while exercising, and options of places to exercise outside of rehab.     Pulmonary Medications:  -Verbally interactive group education provided by instructor with focus on inhaled medications and proper administration.   Anatomy and Physiology of the Respiratory System and Intimacy:  -Group instruction provided by PowerPoint, verbal discussion, and written material to support subject matter. Instructor reviews respiratory cycle and anatomical components of the respiratory system and their functions. Instructor  also reviews differences in obstructive and restrictive respiratory diseases with examples of each. Intimacy, Sex, and Sexuality differences are reviewed with a discussion on how relationships can change when diagnosed with pulmonary disease. Common sexual concerns are reviewed.   MD DAY -A group question and answer session with a medical doctor that allows participants to ask questions that relate to their pulmonary disease state.   OTHER EDUCATION -Group or individual verbal, written, or video instructions that support the educational goals of the pulmonary rehab program.   Holiday Eating Survival Tips:  -Group instruction provided by PowerPoint slides, verbal discussion, and written materials to support subject matter. The instructor gives patients tips, tricks, and techniques to help them not only survive but enjoy the holidays despite the onslaught of food that accompanies the holidays.   Knowledge Questionnaire Score: Knowledge Questionnaire Score - 11/09/18 1059      Knowledge Questionnaire Score   Pre Score  18/18       Core Components/Risk Factors/Patient Goals at Admission: Personal Goals and Risk Factors at Admission - 11/09/18 1105      Core Components/Risk Factors/Patient Goals on Admission    Weight Management  Weight Gain;Yes    Intervention  Weight Management: Develop a combined nutrition and exercise program designed to reach desired caloric intake, while maintaining appropriate intake of nutrient and fiber, sodium and fats, and appropriate energy expenditure required for the weight goal.    Admit Weight  158 lb 11.7 oz (72 kg)    Goal Weight: Short Term  148 lb (67.1  kg)    Expected Outcomes  Short Term: Continue to assess and modify interventions until short term weight is achieved;Long Term: Adherence to nutrition and physical activity/exercise program aimed toward attainment of established weight goal;Weight Loss: Understanding of general recommendations for a  balanced deficit meal plan, which promotes 1-2 lb weight loss per week and includes a negative energy balance of [phone] kcal/d;Understanding of distribution of calorie intake throughout the day with the consumption of 4-5 meals/snacks;Understanding recommendations for meals to include 15-35% energy as protein, 25-35% energy from fat, 35-60% energy from carbohydrates, less than 200mg  of dietary cholesterol, 20-35 gm of total fiber daily    Improve shortness of breath with ADL's  Yes    Intervention  Provide education, individualized exercise plan and daily activity instruction to help decrease symptoms of SOB with activities of daily living.    Expected Outcomes  Short Term: Improve cardiorespiratory fitness to achieve a reduction of symptoms when performing ADLs;Long Term: Be able to perform more ADLs without symptoms or delay the onset of symptoms    Hypertension  Yes    Intervention  Provide education on lifestyle modifcations including regular physical activity/exercise, weight management, moderate sodium restriction and increased consumption of fresh fruit, vegetables, and low fat dairy, alcohol moderation, and smoking cessation.;Monitor prescription use compliance.    Expected Outcomes  Short Term: Continued assessment and intervention until BP is < 140/70mm HG in hypertensive participants. < 130/28mm HG in hypertensive participants with diabetes, heart failure or chronic kidney disease.;Long Term: Maintenance of blood pressure at goal levels.    Lipids  Yes    Intervention  Provide education and support for participant on nutrition & aerobic/resistive exercise along with prescribed medications to achieve LDL 70mg , HDL >40mg .    Expected Outcomes  Short Term: Participant states understanding of desired cholesterol values and is compliant with medications prescribed. Participant is following exercise prescription and nutrition guidelines.;Long Term: Cholesterol controlled with medications as  prescribed, with individualized exercise RX and with personalized nutrition plan. Value goals: LDL < 70mg , HDL > 40 mg.    Stress  Yes    Intervention  Offer individual and/or small group education and counseling on adjustment to heart disease, stress management and health-related lifestyle change. Teach and support self-help strategies.;Refer participants experiencing significant psychosocial distress to appropriate mental health specialists for further evaluation and treatment. When possible, include family members and significant others in education/counseling sessions.    Expected Outcomes  Short Term: Participant demonstrates changes in health-related behavior, relaxation and other stress management skills, ability to obtain effective social support, and compliance with psychotropic medications if prescribed.;Long Term: Emotional wellbeing is indicated by absence of clinically significant psychosocial distress or social isolation.       Core Components/Risk Factors/Patient Goals Review:    Core Components/Risk Factors/Patient Goals at Discharge (Final Review):    ITP Comments: ITP Comments    Row Name 11/09/18 0952           ITP Comments  Dr. Jennet Maduro, Medical Director, Pulmonary Rehab          Comments:

## 2018-11-20 NOTE — Progress Notes (Signed)
Pulmonary Individual Treatment Plan  Patient Details  Name: Yolanda Morris MRN: [id number] Date of Birth: Aug 12, 1946 Referring Provider:     Pulmonary Rehab Walk Test from 11/15/2018 in [facility]  Referring Provider  mcquaid      Initial Encounter Date:    Pulmonary Rehab Walk Test from 11/15/2018 in [facility]  Date  11/15/18      Visit Diagnosis: Centrilobular emphysema ([place])  Patient's Home Medications on Admission:   Current Outpatient Medications:  .  acetaminophen (TYLENOL) 325 MG tablet, Take 650 mg by mouth every 6 (six) hours as needed., Disp: , Rfl:  .  albuterol (PROAIR HFA) 108 (90 Base) MCG/ACT inhaler, Inhale 1-2 puffs into the lungs every 6 (six) hours as needed for wheezing or shortness of breath., Disp: 1 Inhaler, Rfl: 3 .  albuterol (PROVENTIL HFA;VENTOLIN HFA) 108 (90 Base) MCG/ACT inhaler, Inhale 2 puffs into the lungs every 6 (six) hours as needed for wheezing or shortness of breath., Disp: 3 Inhaler, Rfl: 3 .  albuterol (PROVENTIL) (2.5 MG/3ML) 0.083% nebulizer solution, Take 3 mLs (2.5 mg total) by nebulization every 6 (six) hours as needed for wheezing or shortness of breath., Disp: 360 mL, Rfl: 3 .  aspirin EC 81 MG EC tablet, Take 81 mg by mouth daily.  , Disp: , Rfl:  .  calcium carbonate (OS-CAL - DOSED IN MG OF ELEMENTAL CALCIUM) 1250 (500 Ca) MG tablet, Take 1 tablet by mouth daily with breakfast., Disp: , Rfl:  .  chlorpheniramine (CHLOR-TRIMETON) 4 MG tablet, Take 4 mg by mouth daily as needed for allergies., Disp: , Rfl:  .  Cholecalciferol (VITAMIN D3) 1000 UNITS CAPS, Take 1 capsule by mouth daily.  , Disp: , Rfl:  .  Cranberry 500 MG TABS, Take 1 tablet by mouth daily., Disp: , Rfl:  .  formoterol (PERFOROMIST) 20 MCG/2ML nebulizer solution, Take 2 mLs (20 mcg total) by nebulization 2 (two) times daily., Disp: 540 mL, Rfl: 3 .  losartan (COZAAR) 100 MG tablet, Take 100 mg by mouth daily.  , Disp: , Rfl:  .  Lysine 500 MG CAPS, Take 1 capsule by mouth daily.  , Disp: , Rfl:  .  metFORMIN (GLUCOPHAGE-XR) 500 MG 24 hr tablet, , Disp: , Rfl:  .  Multiple Vitamins-Minerals (CENTRUM SILVER PO), Take 1 tablet by mouth daily.  , Disp: , Rfl:  .  omeprazole (PRILOSEC) 20 MG capsule, Take 20 mg by mouth every other day. , Disp: , Rfl:  .  Revefenacin (YUPELRI) 175 MCG/3ML SOLN, Inhale 3 mLs into the lungs 2 (two) times daily., Disp: 180 mL, Rfl: 0 .  simvastatin (ZOCOR) 40 MG tablet, Take 40 mg by mouth at bedtime.  , Disp: , Rfl:  .  Spacer/Aero-Holding Chambers (AEROCHAMBER Z-STAT PLUS) inhaler, Use as instructed, Disp: 1 each, Rfl: 0 .  umeclidinium-vilanterol (ANORO ELLIPTA) 62.5-25 MCG/INH AEPB, USE 1 INHALATION DAILY, Disp: 30 each, Rfl: 6 .  valACYclovir (VALTREX) 500 MG tablet, Take 500 mg by mouth 2 (two) times daily as needed.  , Disp: , Rfl:  .  Zoledronic Acid (RECLAST IV), Inject into the vein. Every 12 months, Disp: , Rfl:   Past Medical History: Past Medical History:  Diagnosis Date  . Cancer ([place])    skin  . COPD (chronic obstructive pulmonary disease) ([place])   . DM (diabetes mellitus) ([place])   . Dyspnea    increased activity   . Genital herpes   .  Hyperlipidemia   . Migraines   . Osteoporosis   . Unspecified essential hypertension     Tobacco Use: Social History   Tobacco Use  Smoking Status Former Smoker  . Packs/day: 2.00  . Years: 25.00  . Pack years: 50.00  . Types: Cigarettes  . Last attempt to quit: 10/17/1996  . Years since quitting: 22.1  Smokeless Tobacco Never Used    Labs: Recent Review Flowsheet Data    There is no flowsheet data to display.      Capillary Blood Glucose: No results found for: GLUCAP   Pulmonary Assessment Scores: Pulmonary Assessment Scores    Row Name 11/09/18 1055 11/15/18 1631       ADL UCSD   ADL Phase  Entry  -    SOB Score total  36  -      CAT Score   CAT Score  20  -      mMRC Score   mMRC Score   -  1       Pulmonary Function Assessment: Pulmonary Function Assessment - 11/09/18 1053      Breath   Bilateral Breath Sounds  Clear;Decreased    Shortness of Breath  Yes;Panic with Shortness of Breath       Exercise Target Goals: Exercise Program Goal: Individual exercise prescription set using results from initial 6 min walk test and THRR while considering  patient's activity barriers and safety.   Exercise Prescription Goal: Initial exercise prescription builds to 30-45 minutes a day of aerobic activity, 2-3 days per week.  Home exercise guidelines will be given to patient during program as part of exercise prescription that the participant will acknowledge.  Activity Barriers & Risk Stratification: Activity Barriers & Cardiac Risk Stratification - 11/09/18 1038      Activity Barriers & Cardiac Risk Stratification   Activity Barriers  Back Problems;Deconditioning;Neck/Spine Problems;Shortness of Breath;History of Falls;Balance Concerns       6 Minute Walk: 6 Minute Walk    Row Name 11/15/18 1556         6 Minute Walk   Phase  Initial     Distance  1200 feet     Walk Time  6 minutes     # of Rest Breaks  0     RPE  12     Perceived Dyspnea   2     Symptoms  No     Resting HR  85 bpm     Resting BP  128/90     Resting Oxygen Saturation   91 %     Exercise Oxygen Saturation  during 6 min walk  87 %     Max Ex. HR  128 bpm     Max Ex. BP  144/90       Interval HR   1 Minute HR  92     2 Minute HR  95     3 Minute HR  122     4 Minute HR  112     5 Minute HR  128     6 Minute HR  120     2 Minute Post HR  114     Interval Heart Rate?  Yes       Interval Oxygen   Interval Oxygen?  Yes     Baseline Oxygen Saturation %  91 %     1 Minute Oxygen Saturation %  89 %     1 Minute Liters of Oxygen  0 L     2 Minute Oxygen Saturation %  88 %     2 Minute Liters of Oxygen  0 L     3 Minute Oxygen Saturation %  91 %     3 Minute Liters of Oxygen  0 L     4  Minute Oxygen Saturation %  87 %     4 Minute Liters of Oxygen  0 L     5 Minute Oxygen Saturation %  88 %     5 Minute Liters of Oxygen  0 L     6 Minute Oxygen Saturation %  87 %     6 Minute Liters of Oxygen  0 L     2 Minute Post Oxygen Saturation %  92 %     2 Minute Post Liters of Oxygen  0 L        Oxygen Initial Assessment: Oxygen Initial Assessment - 11/15/18 1631      Home Oxygen   Home Oxygen Device  None      Initial 6 min Walk   Oxygen Used  None      Program Oxygen Prescription   Program Oxygen Prescription  None   patient desaturated to 87% during 6MWT-will re-evaluate during exercise      Oxygen Re-Evaluation:   Oxygen Discharge (Final Oxygen Re-Evaluation):   Initial Exercise Prescription: Initial Exercise Prescription - 11/15/18 1500      Date of Initial Exercise RX and Referring Provider   Date  11/15/18    Referring Provider  mcquaid      Recumbant Bike   Level  1    Watts  10    Minutes  17      NuStep   Level  2    SPM  80    Minutes  17    METs  1.5      Track   Laps  10    Minutes  17      Prescription Details   Frequency (times per week)  2    Duration  Progress to 45 minutes of aerobic exercise without signs/symptoms of physical distress      Intensity   THRR 40-80% of Max Heartrate  59-118    Ratings of Perceived Exertion  11-15    Perceived Dyspnea  0-4      Resistance Training   Training Prescription  Yes    Weight  orange bands    Reps  10-15       Perform Capillary Blood Glucose checks as needed.  Exercise Prescription Changes:   Exercise Comments:   Exercise Goals and Review: Exercise Goals    Row Name 11/09/18 1042             Exercise Goals   Increase Physical Activity  Yes       Intervention  Provide advice, education, support and counseling about physical activity/exercise needs.;Develop an individualized exercise prescription for aerobic and resistive training based on initial evaluation  findings, risk stratification, comorbidities and participant's personal goals.       Expected Outcomes  Short Term: Attend rehab on a regular basis to increase amount of physical activity.;Long Term: Add in home exercise to make exercise part of routine and to increase amount of physical activity.;Long Term: Exercising regularly at least 3-5 days a week.       Increase Strength and Stamina  Yes       Intervention  Provide advice, education, support  and counseling about physical activity/exercise needs.;Develop an individualized exercise prescription for aerobic and resistive training based on initial evaluation findings, risk stratification, comorbidities and participant's personal goals.       Expected Outcomes  Short Term: Increase workloads from initial exercise prescription for resistance, speed, and METs.;Short Term: Perform resistance training exercises routinely during rehab and add in resistance training at home;Long Term: Improve cardiorespiratory fitness, muscular endurance and strength as measured by increased METs and functional capacity (6MWT)       Able to understand and use rate of perceived exertion (RPE) scale  Yes       Intervention  Provide education and explanation on how to use RPE scale       Expected Outcomes  Short Term: Able to use RPE daily in rehab to express subjective intensity level;Long Term:  Able to use RPE to guide intensity level when exercising independently       Able to understand and use Dyspnea scale  Yes       Intervention  Provide education and explanation on how to use Dyspnea scale       Expected Outcomes  Short Term: Able to use Dyspnea scale daily in rehab to express subjective sense of shortness of breath during exertion;Long Term: Able to use Dyspnea scale to guide intensity level when exercising independently       Knowledge and understanding of Target Heart Rate Range (THRR)  Yes       Intervention  Provide education and explanation of THRR including how  the numbers were predicted and where they are located for reference       Expected Outcomes  Short Term: Able to state/look up THRR;Long Term: Able to use THRR to govern intensity when exercising independently;Short Term: Able to use daily as guideline for intensity in rehab       Understanding of Exercise Prescription  Yes       Intervention  Provide education, explanation, and written materials on patient's individual exercise prescription       Expected Outcomes  Short Term: Able to explain program exercise prescription;Long Term: Able to explain home exercise prescription to exercise independently          Exercise Goals Re-Evaluation :   Discharge Exercise Prescription (Final Exercise Prescription Changes):   Nutrition:  Target Goals: Understanding of nutrition guidelines, daily intake of sodium 1500mg , cholesterol 200mg , calories 30% from fat and 7% or less from saturated fats, daily to have 5 or more servings of fruits and vegetables.  Biometrics: Pre Biometrics - 11/09/18 1047      Pre Biometrics   Grip Strength  21 kg        Nutrition Therapy Plan and Nutrition Goals:   Nutrition Assessments:   Nutrition Goals Re-Evaluation:   Nutrition Goals Discharge (Final Nutrition Goals Re-Evaluation):   Psychosocial: Target Goals: Acknowledge presence or absence of significant depression and/or stress, maximize coping skills, provide positive support system. Participant is able to verbalize types and ability to use techniques and skills needed for reducing stress and depression.  Initial Review & Psychosocial Screening: Initial Psych Review & Screening - 11/09/18 1205      Initial Review   Comments  pt husband passed in 09/2017 and pt states it has been an adjustment and stressful dealing with health issues by herself as well and rebudgeting for a one income house.      Family Dynamics   Comments  pt husband passed in 09/2017. pt now lives alone with dog  who also has  medical issues.       Screening Interventions   Interventions  To provide support and resources with identified psychosocial needs       Quality of Life Scores:  Scores of 19 and below usually indicate a poorer quality of life in these areas.  A difference of  2-3 points is a clinically meaningful difference.  A difference of 2-3 points in the total score of the Quality of Life Index has been associated with significant improvement in overall quality of life, self-image, physical symptoms, and general health in studies assessing change in quality of life.  PHQ-9: Recent Review Flowsheet Data    Depression screen [facility] 2/9 11/09/2018   Decreased Interest 0   Down, Depressed, Hopeless 0   PHQ - 2 Score 0   Altered sleeping 0   Tired, decreased energy 1   Change in appetite 3   Feeling bad or failure about yourself  0   Trouble concentrating 2   Moving slowly or fidgety/restless 0   Suicidal thoughts 0   PHQ-9 Score 6   Difficult doing work/chores Somewhat difficult     Interpretation of Total Score  Total Score Depression Severity:  1-4 = Minimal depression, 5-9 = Mild depression, 10-14 = Moderate depression, 15-19 = Moderately severe depression, 20-27 = Severe depression   Psychosocial Evaluation and Intervention: Psychosocial Evaluation - 11/09/18 1212      Psychosocial Evaluation & Interventions   Comments  Pt recently lost her husband and has been adjusting to life and income without him. pt also has a dog that is older in age and has some health problems but she states she is cherishing her time with him. Pt does have a sister who she maintains close contact with daily and who checks on her often. Pt likes to read, but states that doesnt as much as before her husband passed, but she watched movies and tv every evening with her dog which she enjoys.    Expected Outcomes  pt will become more involved in things that she used to participate in before her husband got sick. States she  wants to read more, especially a book that was given to her by her neice.    Continue Psychosocial Services   Follow up required by staff       Psychosocial Re-Evaluation: Psychosocial Re-Evaluation    [place] Name 11/20/18 1704             Psychosocial Re-Evaluation   Current issues with  Current Stress Concerns       Comments  Has recently lost her husband which has been an adjustment, she has a dog who is a great comfort and has friends and family       Expected Outcomes  To deal with grief and stress in a healthy manner       Interventions  Encouraged to attend Pulmonary Rehabilitation for the exercise;Relaxation education;Stress management education       Continue Psychosocial Services   Follow up required by staff       Comments  Is just beginning program, has not exercised yet, will continue to monitor psychsocial for barriers         Initial Review   Source of Stress Concerns  Chronic Illness;Financial          Psychosocial Discharge (Final Psychosocial Re-Evaluation): Psychosocial Re-Evaluation - 11/20/18 1704      Psychosocial Re-Evaluation   Current issues with  Current Stress Concerns  Comments  Has recently lost her husband which has been an adjustment, she has a dog who is a great comfort and has friends and family    Expected Outcomes  To deal with grief and stress in a healthy manner    Interventions  Encouraged to attend Pulmonary Rehabilitation for the exercise;Relaxation education;Stress management education    Continue Psychosocial Services   Follow up required by staff    Comments  Is just beginning program, has not exercised yet, will continue to monitor psychsocial for barriers      Initial Review   Source of Stress Concerns  Chronic Illness;Financial       Education: Education Goals: Education classes will be provided on a weekly basis, covering required topics. Participant will state understanding/return demonstration of topics presented.  Learning  Barriers/Preferences: Learning Barriers/Preferences - 11/09/18 1104      Learning Barriers/Preferences   Learning Barriers  Sight    Learning Preferences  Audio;Computer/Internet;Group Instruction;Individual Instruction;Pictoral;Skilled Demonstration;Verbal Instruction;Video;Written Material       Education Topics: Risk Factor Reduction:  -Group instruction that is supported by a PowerPoint presentation. Instructor discusses the definition of a risk factor, different risk factors for pulmonary disease, and how the heart and lungs work together.     Nutrition for Pulmonary Patient:  -Group instruction provided by PowerPoint slides, verbal discussion, and written materials to support subject matter. The instructor gives an explanation and review of healthy diet recommendations, which includes a discussion on weight management, recommendations for fruit and vegetable consumption, as well as protein, fluid, caffeine, fiber, sodium, sugar, and alcohol. Tips for eating when patients are short of breath are discussed.   Pursed Lip Breathing:  -Group instruction that is supported by demonstration and informational handouts. Instructor discusses the benefits of pursed lip and diaphragmatic breathing and detailed demonstration on how to preform both.     Oxygen Safety:  -Group instruction provided by PowerPoint, verbal discussion, and written material to support subject matter. There is an overview of "What is Oxygen" and "Why do we need it".  Instructor also reviews how to create a safe environment for oxygen use, the importance of using oxygen as prescribed, and the risks of noncompliance. There is a brief discussion on traveling with oxygen and resources the patient may utilize.   Oxygen Equipment:  -Group instruction provided by [facility] Staff utilizing handouts, written materials, and equipment demonstrations.   Signs and Symptoms:  -Group instruction provided by written material and  verbal discussion to support subject matter. Warning signs and symptoms of infection, stroke, and heart attack are reviewed and when to call the physician/911 reinforced. Tips for preventing the spread of infection discussed.   Advanced Directives:  -Group instruction provided by verbal instruction and written material to support subject matter. Instructor reviews Advanced Directive laws and proper instruction for filling out document.   Pulmonary Video:  -Group video education that reviews the importance of medication and oxygen compliance, exercise, good nutrition, pulmonary hygiene, and pursed lip and diaphragmatic breathing for the pulmonary patient.   Exercise for the Pulmonary Patient:  -Group instruction that is supported by a PowerPoint presentation. Instructor discusses benefits of exercise, core components of exercise, frequency, duration, and intensity of an exercise routine, importance of utilizing pulse oximetry during exercise, safety while exercising, and options of places to exercise outside of rehab.     Pulmonary Medications:  -Verbally interactive group education provided by instructor with focus on inhaled medications and proper administration.   Anatomy and Physiology  of the Respiratory System and Intimacy:  -Group instruction provided by PowerPoint, verbal discussion, and written material to support subject matter. Instructor reviews respiratory cycle and anatomical components of the respiratory system and their functions. Instructor also reviews differences in obstructive and restrictive respiratory diseases with examples of each. Intimacy, Sex, and Sexuality differences are reviewed with a discussion on how relationships can change when diagnosed with pulmonary disease. Common sexual concerns are reviewed.   MD DAY -A group question and answer session with a medical doctor that allows participants to ask questions that relate to their pulmonary disease  state.   OTHER EDUCATION -Group or individual verbal, written, or video instructions that support the educational goals of the pulmonary rehab program.   Holiday Eating Survival Tips:  -Group instruction provided by PowerPoint slides, verbal discussion, and written materials to support subject matter. The instructor gives patients tips, tricks, and techniques to help them not only survive but enjoy the holidays despite the onslaught of food that accompanies the holidays.   Knowledge Questionnaire Score: Knowledge Questionnaire Score - 11/09/18 1059      Knowledge Questionnaire Score   Pre Score  18/18       Core Components/Risk Factors/Patient Goals at Admission: Personal Goals and Risk Factors at Admission - 11/09/18 1105      Core Components/Risk Factors/Patient Goals on Admission    Weight Management  Weight Gain;Yes    Intervention  Weight Management: Develop a combined nutrition and exercise program designed to reach desired caloric intake, while maintaining appropriate intake of nutrient and fiber, sodium and fats, and appropriate energy expenditure required for the weight goal.    Admit Weight  158 lb 11.7 oz (72 kg)    Goal Weight: Short Term  148 lb (67.1 kg)    Expected Outcomes  Short Term: Continue to assess and modify interventions until short term weight is achieved;Long Term: Adherence to nutrition and physical activity/exercise program aimed toward attainment of established weight goal;Weight Loss: Understanding of general recommendations for a balanced deficit meal plan, which promotes 1-2 lb weight loss per week and includes a negative energy balance of [phone] kcal/d;Understanding of distribution of calorie intake throughout the day with the consumption of 4-5 meals/snacks;Understanding recommendations for meals to include 15-35% energy as protein, 25-35% energy from fat, 35-60% energy from carbohydrates, less than 200mg  of dietary cholesterol, 20-35 gm of total fiber  daily    Improve shortness of breath with ADL's  Yes    Intervention  Provide education, individualized exercise plan and daily activity instruction to help decrease symptoms of SOB with activities of daily living.    Expected Outcomes  Short Term: Improve cardiorespiratory fitness to achieve a reduction of symptoms when performing ADLs;Long Term: Be able to perform more ADLs without symptoms or delay the onset of symptoms    Hypertension  Yes    Intervention  Provide education on lifestyle modifcations including regular physical activity/exercise, weight management, moderate sodium restriction and increased consumption of fresh fruit, vegetables, and low fat dairy, alcohol moderation, and smoking cessation.;Monitor prescription use compliance.    Expected Outcomes  Short Term: Continued assessment and intervention until BP is < 140/41mm HG in hypertensive participants. < 130/3mm HG in hypertensive participants with diabetes, heart failure or chronic kidney disease.;Long Term: Maintenance of blood pressure at goal levels.    Lipids  Yes    Intervention  Provide education and support for participant on nutrition & aerobic/resistive exercise along with prescribed medications to achieve LDL 70mg , HDL >40mg .  Expected Outcomes  Short Term: Participant states understanding of desired cholesterol values and is compliant with medications prescribed. Participant is following exercise prescription and nutrition guidelines.;Long Term: Cholesterol controlled with medications as prescribed, with individualized exercise RX and with personalized nutrition plan. Value goals: LDL < 70mg , HDL > 40 mg.    Stress  Yes    Intervention  Offer individual and/or small group education and counseling on adjustment to heart disease, stress management and health-related lifestyle change. Teach and support self-help strategies.;Refer participants experiencing significant psychosocial distress to appropriate mental health  specialists for further evaluation and treatment. When possible, include family members and significant others in education/counseling sessions.    Expected Outcomes  Short Term: Participant demonstrates changes in health-related behavior, relaxation and other stress management skills, ability to obtain effective social support, and compliance with psychotropic medications if prescribed.;Long Term: Emotional wellbeing is indicated by absence of clinically significant psychosocial distress or social isolation.       Core Components/Risk Factors/Patient Goals Review:  Goals and Risk Factor Review    Row Name 11/20/18 1708             Core Components/Risk Factors/Patient Goals Review   Personal Goals Review  Weight Management/Obesity;Improve shortness of breath with ADL's;Increase knowledge of respiratory medications and ability to use respiratory devices properly.;Stress;Develop more efficient breathing techniques such as purse lipped breathing and diaphragmatic breathing and practicing self-pacing with activity.       Review  Has not started the exercise sessions as of yet, but will begin 11/22/2018       Expected Outcomes  See admission goals          Core Components/Risk Factors/Patient Goals at Discharge (Final Review):  Goals and Risk Factor Review - 11/20/18 1708      Core Components/Risk Factors/Patient Goals Review   Personal Goals Review  Weight Management/Obesity;Improve shortness of breath with ADL's;Increase knowledge of respiratory medications and ability to use respiratory devices properly.;Stress;Develop more efficient breathing techniques such as purse lipped breathing and diaphragmatic breathing and practicing self-pacing with activity.    Review  Has not started the exercise sessions as of yet, but will begin 11/22/2018    Expected Outcomes  See admission goals       ITP Comments: ITP Comments    Row Name 11/09/18 8832           ITP Comments  Dr. Jennet Maduro, Medical  Director, Pulmonary Rehab          Comments: ITP REVIEW Pt is making expected progress toward pulmonary rehab goals after completing 0 sessions. Recommend continued exercise, life style modification, education, and utilization of breathing techniques to increase stamina and strength and decrease shortness of breath with exertion.

## 2018-11-22 ENCOUNTER — Encounter (HOSPITAL_COMMUNITY)
Admission: RE | Admit: 2018-11-22 | Discharge: 2018-11-22 | Disposition: A | Discharge status: Home / Self Care | Payer: Medicare Other | Source: Ambulatory Visit | Attending: Pulmonary Disease | Admitting: Pulmonary Disease

## 2018-11-22 DIAGNOSIS — J432 Centrilobular emphysema: Secondary | ICD-10-CM | POA: Diagnosis not present

## 2018-11-22 NOTE — Progress Notes (Signed)
Daily Session Note  Patient Details  Name: Yolanda Morris MRN: [id number] Date of Birth: 08/28/1946 Referring Provider:     Pulmonary Rehab Walk Test from 11/15/2018 in [facility]  Referring Provider  mcquaid      Encounter Date: 11/22/2018  Check In: Session Check In - 11/22/18 1349      Check-In   Supervising physician immediately available to respond to emergencies  [facility] immediately available    Physician(s)  Dr. Joseph    Location  [facility]    Staff Present  Lindsay Agnew RN, BSN;[name], MS, ACSM RCEP, Exercise Physiologist;Dalton Fletcher, MS, Exercise Physiologist;Lisa Hughes, RN    Medication changes reported      No    Fall or balance concerns reported     No    Tobacco Cessation  No Change    Warm-up and Cool-down  Performed as group-led instruction    Resistance Training Performed  Yes    VAD Patient?  No    PAD/SET Patient?  No      Pain Assessment   Currently in Pain?  No/denies    Pain Score  0-No pain    Multiple Pain Sites  No       Capillary Blood Glucose: No results found for this or any previous visit (from the past 24 hour(s)).    Social History   Tobacco Use  Smoking Status Former Smoker  . Packs/day: 2.00  . Years: 25.00  . Pack years: 50.00  . Types: Cigarettes  . Last attempt to quit: 10/17/1996  . Years since quitting: 22.1  Smokeless Tobacco Never Used    Goals Met:  Exercise tolerated well  Goals Unmet:  Not Applicable  Comments: Service time is from 1:30p to 3:15p    Dr. Wesam G. Yacoub is Medical Director for Pulmonary Rehab at  Hospital. 

## 2018-11-27 ENCOUNTER — Encounter (HOSPITAL_COMMUNITY)
Admission: RE | Admit: 2018-11-27 | Discharge: 2018-11-27 | Disposition: A | Discharge status: Home / Self Care | Payer: Medicare Other | Source: Ambulatory Visit | Attending: Pulmonary Disease | Admitting: Pulmonary Disease

## 2018-11-27 DIAGNOSIS — J432 Centrilobular emphysema: Secondary | ICD-10-CM | POA: Diagnosis not present

## 2018-11-27 NOTE — Progress Notes (Signed)
Daily Session Note  Patient Details  Name: Yolanda Morris MRN: [id number] Date of Birth: 04-Dec-1945 Referring Provider:     Pulmonary Rehab Walk Test from 11/15/2018 in [place]  Referring Provider  mcquaid      Encounter Date: 11/27/2018  Check In: Session Check In - 11/27/18 1330      Check-In   Supervising physician immediately available to respond to emergencies  [facility] immediately available    Physician(s)  Dr. Broadus John    Location  [facility]    Staff Present  [name], MS, Exercise Physiologist;[name], RN;[name], RN, BSN;[name], MS, ACSM RCEP, Exercise Physiologist    Medication changes reported      No    Fall or balance concerns reported     No    Warm-up and Cool-down  Performed as group-led Higher education careers adviser Performed  Yes    VAD Patient?  No    PAD/SET Patient?  No      Pain Assessment   Currently in Pain?  No/denies    Multiple Pain Sites  No       Capillary Blood Glucose: No results found for this or any previous visit (from the past 24 hour(s)).    Social History   Tobacco Use  Smoking Status Former Smoker  . Packs/day: 2.00  . Years: 25.00  . Pack years: 50.00  . Types: Cigarettes  . Last attempt to quit: 10/17/1996  . Years since quitting: 22.1  Smokeless Tobacco Never Used    Goals Met:  Proper associated with RPD/PD & O2 Sat Exercise tolerated well Strength training completed today  Goals Unmet:  Not Applicable  Comments: Service time is from 1330 to 1500   Dr. Rush Farmer is Medical Director for Pulmonary Rehab at [facility].

## 2018-11-29 ENCOUNTER — Encounter (HOSPITAL_COMMUNITY)
Admission: RE | Admit: 2018-11-29 | Discharge: 2018-11-29 | Disposition: A | Discharge status: Home / Self Care | Payer: Medicare Other | Source: Ambulatory Visit | Attending: Pulmonary Disease | Admitting: Pulmonary Disease

## 2018-11-29 DIAGNOSIS — J432 Centrilobular emphysema: Secondary | ICD-10-CM

## 2018-11-29 NOTE — Progress Notes (Signed)
Daily Session Note  Patient Details  Name: Yolanda Morris MRN: [id number] Date of Birth: [birth year] Referring Provider:     Pulmonary Rehab Walk Test from 11/15/2018 in [place]  Referring Provider  mcquaid      Encounter Date: 11/29/2018  Check In: Session Check In - 11/29/18 1330      Check-In   Supervising physician immediately available to respond to emergencies  [facility] immediately available       Capillary Blood Glucose: No results found for this or any previous visit (from the past 24 hour(s)).    Social History   Tobacco Use  Smoking Status Former Smoker  . Packs/day: 2.00  . Years: 25.00  . Pack years: 50.00  . Types: Cigarettes  . Last attempt to quit: 10/17/1996  . Years since quitting: 22.1  Smokeless Tobacco Never Used    Goals Met:  Proper associated with RPD/PD & O2 Sat Exercise tolerated well Strength training completed today  Goals Unmet:  Not Applicable  Comments: Service time is from 1330 to 1525   Dr. Rush Farmer is Medical Director for Pulmonary Rehab at [facility].

## 2018-11-30 NOTE — Progress Notes (Signed)
Yolanda Morris 73 y.o. female  DOB: 07-Jul-1946 MRN: [id number]           Nutrition Note 1. Centrilobular emphysema ([place])    Past Medical History:  Diagnosis Date  . Cancer ([place])    skin  . COPD (chronic obstructive pulmonary disease) ([place])   . DM (diabetes mellitus) ([place])   . Dyspnea    increased activity   . Genital herpes   . Hyperlipidemia   . Migraines   . Osteoporosis   . Unspecified essential hypertension    Meds reviewed.   Current Outpatient Medications (Endocrine & Metabolic):  .  metFORMIN (GLUCOPHAGE-XR) 500 MG 24 hr tablet,  .  Zoledronic Acid (RECLAST IV), Inject into the vein. Every 12 months  Current Outpatient Medications (Cardiovascular):  .  losartan (COZAAR) 100 MG tablet, Take 100 mg by mouth daily.  .  simvastatin (ZOCOR) 40 MG tablet, Take 40 mg by mouth at bedtime.    Current Outpatient Medications (Respiratory):  .  albuterol (PROAIR HFA) 108 (90 Base) MCG/ACT inhaler, Inhale 1-2 puffs into the lungs every 6 (six) hours as needed for wheezing or shortness of breath. Marland Kitchen  albuterol (PROVENTIL HFA;VENTOLIN HFA) 108 (90 Base) MCG/ACT inhaler, Inhale 2 puffs into the lungs every 6 (six) hours as needed for wheezing or shortness of breath. Marland Kitchen  albuterol (PROVENTIL) (2.5 MG/3ML) 0.083% nebulizer solution, Take 3 mLs (2.5 mg total) by nebulization every 6 (six) hours as needed for wheezing or shortness of breath. .  chlorpheniramine (CHLOR-TRIMETON) 4 MG tablet, Take 4 mg by mouth daily as needed for allergies. .  formoterol (PERFOROMIST) 20 MCG/2ML nebulizer solution, Take 2 mLs (20 mcg total) by nebulization 2 (two) times daily. .  Revefenacin (YUPELRI) 175 MCG/3ML SOLN, Inhale 3 mLs into the lungs 2 (two) times daily. Marland Kitchen  umeclidinium-vilanterol (ANORO ELLIPTA) 62.5-25 MCG/INH AEPB, USE 1 INHALATION DAILY  Current Outpatient Medications (Analgesics):  .  acetaminophen (TYLENOL) 325 MG tablet, Take 650 mg by mouth every 6 (six) hours as needed. Marland Kitchen  aspirin EC 81  MG EC tablet, Take 81 mg by mouth daily.     Current Outpatient Medications (Other):  .  calcium carbonate (OS-CAL - DOSED IN MG OF ELEMENTAL CALCIUM) 1250 (500 Ca) MG tablet, Take 1 tablet by mouth daily with breakfast. .  Cholecalciferol (VITAMIN D3) 1000 UNITS CAPS, Take 1 capsule by mouth daily.   .  Cranberry 500 MG TABS, Take 1 tablet by mouth daily. Marland Kitchen  Lysine 500 MG CAPS, Take 1 capsule by mouth daily.   .  Multiple Vitamins-Minerals (CENTRUM SILVER PO), Take 1 tablet by mouth daily.   Marland Kitchen  omeprazole (PRILOSEC) 20 MG capsule, Take 20 mg by mouth every other day.  Marland Kitchen  Spacer/Aero-Holding Chambers (AEROCHAMBER Z-STAT PLUS) inhaler, Use as instructed .  valACYclovir (VALTREX) 500 MG tablet, Take 500 mg by mouth 2 (two) times daily as needed.     Ht: Ht Readings from Last 1 Encounters:  11/13/18 5\' 2"  (1.575 m)     Wt:  Wt Readings from Last 6 Encounters:  11/13/18 158 lb 3.2 oz (71.8 kg)  11/09/18 158 lb 11.7 oz (72 kg)  10/23/18 156 lb (70.8 kg)  10/02/18 158 lb (71.7 kg)  08/15/18 153 lb (69.4 kg)  05/31/18 152 lb (68.9 kg)     BMI: 28.94    Current tobacco use? No    Labs:  Lipid Panel  No results found for: CHOL, TRIG, HDL, CHOLHDL, VLDL, LDLCALC, LDLDIRECT  No results  found for: HGBA1C  Nutrition Diagnosis ? Food-and nutrition-related knowledge deficit related to lack of exposure to information as related to diagnosis of pulmonary disease ? Overweight/obesity related to excessive energy intake as evidenced by a BMI of BMI: 28.94  Goal(s) 1. Identify food quantities necessary to achieve wt loss of  -2# per week to a goal wt loss of 2.7-10.9 kg (6-24 lb) at graduation from pulmonary rehab. 2. Pt to weigh and measure portions for accuracy 3. Pt to build a healthy plate including fruits, vegetables, whole grains, and low-fat dairy products in a healthy meal plan.  Plan:  Pt to attend Pulmonary Nutrition class Will provide client-centered nutrition education as  part of interdisciplinary care.    Monitor and Evaluate progress toward nutrition goal with team.   Laurina Bustle, MS, RD, LDN 11/30/2018 9:42 AM

## 2018-12-04 ENCOUNTER — Encounter (HOSPITAL_COMMUNITY)
Admission: RE | Admit: 2018-12-04 | Discharge: 2018-12-04 | Disposition: A | Discharge status: Home / Self Care | Payer: Medicare Other | Source: Ambulatory Visit | Attending: Pulmonary Disease | Admitting: Pulmonary Disease

## 2018-12-04 VITALS — Wt 160.1 lb

## 2018-12-04 DIAGNOSIS — J432 Centrilobular emphysema: Secondary | ICD-10-CM

## 2018-12-04 NOTE — Progress Notes (Signed)
Daily Session Note  Patient Details  Name: Yolanda Morris MRN: [id number] Date of Birth: [birth year] Referring Provider:     Pulmonary Rehab Walk Test from 11/15/2018 in [place]  Referring Provider  mcquaid      Encounter Date: 12/04/2018  Check In: Session Check In - 12/04/18 1404      Check-In   Supervising physician immediately available to respond to emergencies  [facility] immediately available    Physician(s)  Dr. Berle Mull    Location  [facility]    Staff Present  Rosebud Poles, RN, BSN;Carlette Carlton, RN, BSN;[name], MS, ACSM RCEP, Exercise Physiologist;Dalton Kris Mouton, MS, Exercise Physiologist;Jacqulin Brandenburger Ysidro Evert, RN    Medication changes reported      No    Fall or balance concerns reported     No    Tobacco Cessation  No Change    Warm-up and Cool-down  Performed as group-led instruction    Resistance Training Performed  Yes    VAD Patient?  No    PAD/SET Patient?  No      Pain Assessment   Currently in Pain?  No/denies    Pain Score  0-No pain    Multiple Pain Sites  No       Capillary Blood Glucose: No results found for this or any previous visit (from the past 24 hour(s)). POCT Glucose - 12/04/18 1601      POCT Blood Glucose   Pre-Exercise  116 mg/dL    Post-Exercise  101 mg/dL      Exercise Prescription Changes - 12/04/18 1600      Response to Exercise   Blood Pressure (Admit)  114/78    Blood Pressure (Exercise)  138/76    Blood Pressure (Exit)  130/80    Heart Rate (Admit)  88 bpm    Heart Rate (Exercise)  132 bpm    Heart Rate (Exit)  84 bpm   at end of cool down HR 119. Gave her water and feet elevated   Oxygen Saturation (Admit)  93 %    Oxygen Saturation (Exercise)  89 %    Oxygen Saturation (Exit)  92 %    Rating of Perceived Exertion (Exercise)  15    Perceived Dyspnea (Exercise)  2    Duration  Progress to 45 minutes of aerobic exercise without signs/symptoms of physical  distress    Intensity  Other (comment)      Progression   Progression  Continue to progress workloads to maintain intensity without signs/symptoms of physical distress.   40-80% of HRR     Resistance Training   Training Prescription  Yes    Weight  orange bands    Reps  10-15    Time  10 Minutes      Recumbant Bike   Level  1    Watts  10    Minutes  17      NuStep   Level  2    SPM  80    Minutes  17    METs  2.6      Track   Laps  --   6 mintue walk test      Social History   Tobacco Use  Smoking Status Former Smoker  . Packs/day: 2.00  . Years: 25.00  . Pack years: 50.00  . Types: Cigarettes  . Last attempt to quit: 10/17/1996  . Years since quitting: 22.1  Smokeless Tobacco Never Used  Goals Met:  Exercise tolerated well No report of cardiac concerns or symptoms Strength training completed today  Goals Unmet:  HR at end of cool down 119 gave pt water and elevated feet. HR down to 84 at discharge  Comments: Service time is from 1330 to 1500    Dr. Rush Farmer is Medical Director for Pulmonary Rehab at [facility].

## 2018-12-06 ENCOUNTER — Encounter (HOSPITAL_COMMUNITY)
Admission: RE | Admit: 2018-12-06 | Discharge: 2018-12-06 | Disposition: A | Discharge status: Home / Self Care | Payer: Medicare Other | Source: Ambulatory Visit | Attending: Pulmonary Disease | Admitting: Pulmonary Disease

## 2018-12-06 DIAGNOSIS — J432 Centrilobular emphysema: Secondary | ICD-10-CM | POA: Diagnosis not present

## 2018-12-06 NOTE — Progress Notes (Signed)
Daily Session Note  Patient Details  Name: Yolanda Morris MRN: [id number] Date of Birth: 01-15-1946 Referring Provider:     Pulmonary Rehab Walk Test from 11/15/2018 in [place]  Referring Provider  mcquaid      Encounter Date: 12/06/2018  Check In: Session Check In - 12/06/18 [place]      Check-In   Supervising physician immediately available to respond to emergencies  [facility] immediately available    Physician(s)  Dr. Herbert Moors    Location  [facility]    Staff Present  Joycelyn Man RN, BSN;Mckinzee Spirito Leonia Reeves, RN, BSN;[name], MS, ACSM RCEP, Exercise Physiologist;Dalton Kris Mouton, MS, Exercise Physiologist    Medication changes reported      No    Fall or balance concerns reported     No    Tobacco Cessation  No Change    Warm-up and Cool-down  Performed as group-led instruction    Resistance Training Performed  Yes    VAD Patient?  No    PAD/SET Patient?  No      Pain Assessment   Currently in Pain?  No/denies    Pain Score  0-No pain    Multiple Pain Sites  No       Capillary Blood Glucose: No results found for this or any previous visit (from the past 24 hour(s)).    Social History   Tobacco Use  Smoking Status Former Smoker  . Packs/day: 2.00  . Years: 25.00  . Pack years: 50.00  . Types: Cigarettes  . Last attempt to quit: 10/17/1996  . Years since quitting: 22.1  Smokeless Tobacco Never Used    Goals Met:  Proper associated with RPD/PD & O2 Sat Exercise tolerated well Strength training completed today  Goals Unmet:  Not Applicable  Comments: Service time is from 1330 to 1500   Dr. Rush Farmer is Medical Director for Pulmonary Rehab at [facility].

## 2018-12-06 NOTE — Progress Notes (Signed)
Yolanda Morris 73 y.o. female   DOB: 1946/03/16 MRN: [id number]          Nutrition 1. Centrilobular emphysema ([place])    Past Medical History:  Diagnosis Date  . Cancer ([place])    skin  . COPD (chronic obstructive pulmonary disease) ([place])   . DM (diabetes mellitus) ([place])   . Dyspnea    increased activity   . Genital herpes   . Hyperlipidemia   . Migraines   . Osteoporosis   . Unspecified essential hypertension      Meds reviewed.  Current Outpatient Medications (Endocrine & Metabolic):  .  metFORMIN (GLUCOPHAGE-XR) 500 MG 24 hr tablet,  .  Zoledronic Acid (RECLAST IV), Inject into the vein. Every 12 months  Current Outpatient Medications (Cardiovascular):  .  losartan (COZAAR) 100 MG tablet, Take 100 mg by mouth daily.  .  simvastatin (ZOCOR) 40 MG tablet, Take 40 mg by mouth at bedtime.    Current Outpatient Medications (Respiratory):  .  albuterol (PROAIR HFA) 108 (90 Base) MCG/ACT inhaler, Inhale 1-2 puffs into the lungs every 6 (six) hours as needed for wheezing or shortness of breath. Marland Kitchen  albuterol (PROVENTIL HFA;VENTOLIN HFA) 108 (90 Base) MCG/ACT inhaler, Inhale 2 puffs into the lungs every 6 (six) hours as needed for wheezing or shortness of breath. Marland Kitchen  albuterol (PROVENTIL) (2.5 MG/3ML) 0.083% nebulizer solution, Take 3 mLs (2.5 mg total) by nebulization every 6 (six) hours as needed for wheezing or shortness of breath. .  chlorpheniramine (CHLOR-TRIMETON) 4 MG tablet, Take 4 mg by mouth daily as needed for allergies. .  formoterol (PERFOROMIST) 20 MCG/2ML nebulizer solution, Take 2 mLs (20 mcg total) by nebulization 2 (two) times daily. .  Revefenacin (YUPELRI) 175 MCG/3ML SOLN, Inhale 3 mLs into the lungs 2 (two) times daily. Marland Kitchen  umeclidinium-vilanterol (ANORO ELLIPTA) 62.5-25 MCG/INH AEPB, USE 1 INHALATION DAILY  Current Outpatient Medications (Analgesics):  .  acetaminophen (TYLENOL) 325 MG tablet, Take 650 mg by mouth every 6 (six) hours as needed. Marland Kitchen  aspirin EC 81 MG  EC tablet, Take 81 mg by mouth daily.     Current Outpatient Medications (Other):  .  calcium carbonate (OS-CAL - DOSED IN MG OF ELEMENTAL CALCIUM) 1250 (500 Ca) MG tablet, Take 1 tablet by mouth daily with breakfast. .  Cholecalciferol (VITAMIN D3) 1000 UNITS CAPS, Take 1 capsule by mouth daily.   .  Cranberry 500 MG TABS, Take 1 tablet by mouth daily. Marland Kitchen  Lysine 500 MG CAPS, Take 1 capsule by mouth daily.   .  Multiple Vitamins-Minerals (CENTRUM SILVER PO), Take 1 tablet by mouth daily.   Marland Kitchen  omeprazole (PRILOSEC) 20 MG capsule, Take 20 mg by mouth every other day.  Marland Kitchen  Spacer/Aero-Holding Chambers (AEROCHAMBER Z-STAT PLUS) inhaler, Use as instructed .  valACYclovir (VALTREX) 500 MG tablet, Take 500 mg by mouth 2 (two) times daily as needed.    Ht: Ht Readings from Last 1 Encounters:  11/13/18 5\' 2"  (1.575 m)     Wt:  Wt Readings from Last 3 Encounters:  12/04/18 160 lb 0.9 oz (72.6 kg)  11/13/18 158 lb 3.2 oz (71.8 kg)  11/09/18 158 lb 11.7 oz (72 kg)     BMI: There is no height or weight on file to calculate BMI.     Current tobacco use? No  Labs:  Lipid Panel  No results found for: CHOL, TRIG, HDL, CHOLHDL, VLDL, LDLCALC, LDLDIRECT  No results found for: HGBA1C Note Spoke with pt. Pt  is overweight.  Pt eats 3 meals a day; most prepared at home. Recommended pt eat several small meals and snacks across the day. Making healthy food choices the majority of the time.  Pt's Rate Your Plate results reviewed with pt. Pt does not avoid salty food; uses canned/ convenience food.  Pt adds salt to food. The role of sodium in lung disease reviewed with pt. Pt expressed understanding of the information reviewed.Pt is has diabetes. Pt checks CBG's 2-3  times a day. CBG's reportedly  110-140's- mg/dL before meals.   Nutrition Diagnosis ? Food-and nutrition-related knowledge deficit related to lack of exposure to information as related to diagnosis of pulmonary disease ? Overweight/obesity  related to excessive energy intake as evidenced by a BMI of 29.27  Nutrition Intervention ? Pt's individual nutrition plan and goals reviewed with pt. ? Benefits of adopting healthy eating habits discussed when pt's Rate Your Plate reviewed.  Goal(s) 1. Identify food quantities necessary to achieve wt loss of  -2# per week to a goal wt loss of 6-24 lb at graduation from pulmonary rehab. 2. Pt to weigh and measure portions for accuracy. 3. Pt to build a healthy plate including fruits, vegetables, whole grains, and low-fat dairy products in a healthy meal plan. Plan:  Pt to attend Pulmonary Nutrition class Will provide client-centered nutrition education as part of interdisciplinary care.    Monitor and Evaluate progress toward nutrition goal with team.   Laurina Bustle, MS, RD, LDN 12/06/2018 3:16 PM

## 2018-12-11 ENCOUNTER — Encounter (HOSPITAL_COMMUNITY)
Admission: RE | Admit: 2018-12-11 | Discharge: 2018-12-11 | Disposition: A | Discharge status: Home / Self Care | Payer: Medicare Other | Source: Ambulatory Visit | Attending: Pulmonary Disease | Admitting: Pulmonary Disease

## 2018-12-11 DIAGNOSIS — J432 Centrilobular emphysema: Secondary | ICD-10-CM

## 2018-12-11 NOTE — Progress Notes (Signed)
Daily Session Note  Patient Details  Name: Yolanda Morris MRN: [id number] Date of Birth: [birth year] Referring Provider:     Pulmonary Rehab Walk Test from 11/15/2018 in [place]  Referring Provider  mcquaid      Encounter Date: 12/11/2018  Check In: Session Check In - 12/11/18 1547      Check-In   Supervising physician immediately available to respond to emergencies  [facility] immediately available    Physician(s)  Dr. Herbert Moors    Location  [facility]    Staff Present  Rosebud Poles, RN, BSN;[name], MS, ACSM RCEP, Exercise Physiologist;Dalton Kris Mouton, MS, Exercise Physiologist;Other    Medication changes reported      No    Fall or balance concerns reported     No    Tobacco Cessation  No Change    Warm-up and Cool-down  Performed as group-led instruction    Resistance Training Performed  Yes    VAD Patient?  No    PAD/SET Patient?  No      Pain Assessment   Currently in Pain?  No/denies    Pain Score  0-No pain    Multiple Pain Sites  No       Capillary Blood Glucose: No results found for this or any previous visit (from the past 24 hour(s)).    Social History   Tobacco Use  Smoking Status Former Smoker  . Packs/day: 2.00  . Years: 25.00  . Pack years: 50.00  . Types: Cigarettes  . Last attempt to quit: 10/17/1996  . Years since quitting: 22.1  Smokeless Tobacco Never Used    Goals Met:  Proper associated with RPD/PD & O2 Sat Exercise tolerated well Strength training completed today  Goals Unmet:  Not Applicable  Comments: Service time is from 1330 to 1500    Dr. Rush Farmer is Medical Director for Pulmonary Rehab at [facility].

## 2018-12-13 ENCOUNTER — Encounter (HOSPITAL_COMMUNITY)
Admission: RE | Admit: 2018-12-13 | Discharge: 2018-12-13 | Disposition: A | Discharge status: Home / Self Care | Payer: Medicare Other | Source: Ambulatory Visit | Attending: Pulmonary Disease | Admitting: Pulmonary Disease

## 2018-12-13 DIAGNOSIS — J432 Centrilobular emphysema: Secondary | ICD-10-CM | POA: Diagnosis not present

## 2018-12-13 NOTE — Progress Notes (Signed)
Daily Session Note  Patient Details  Name: Yolanda Morris MRN: [id number] Date of Birth: October 19, 1945 Referring Provider:     Pulmonary Rehab Walk Test from 11/15/2018 in [place]  Referring Provider  mcquaid      Encounter Date: 12/13/2018  Check In: Session Check In - 12/13/18 1341      Check-In   Supervising physician immediately available to respond to emergencies  [facility] immediately available    Physician(s)  Dr. Verlon Au    Location  [facility]    Staff Present  Rosebud Poles, RN, BSN;[name], MS, ACSM RCEP, Exercise Physiologist;Dalton Kris Mouton, MS, Exercise Physiologist;[name], RN    Medication changes reported      No    Fall or balance concerns reported     No    Tobacco Cessation  No Change    Warm-up and Cool-down  Performed as group-led instruction    Resistance Training Performed  Yes    VAD Patient?  No    PAD/SET Patient?  No      Pain Assessment   Currently in Pain?  No/denies       Capillary Blood Glucose: No results found for this or any previous visit (from the past 24 hour(s)).    Social History   Tobacco Use  Smoking Status Former Smoker  . Packs/day: 2.00  . Years: 25.00  . Pack years: 50.00  . Types: Cigarettes  . Last attempt to quit: 10/17/1996  . Years since quitting: 22.1  Smokeless Tobacco Never Used    Goals Met:  Proper associated with RPD/PD & O2 Sat Exercise tolerated well Strength training completed today  Goals Unmet:  Not Applicable  Comments: Service time is from 1330 to 1510    Dr. Rush Farmer is Medical Director for Pulmonary Rehab at [facility].

## 2018-12-18 ENCOUNTER — Telehealth (HOSPITAL_COMMUNITY): Payer: Self-pay | Admitting: Family Medicine

## 2018-12-18 ENCOUNTER — Encounter (HOSPITAL_COMMUNITY)
Admission: RE | Admit: 2018-12-18 | Discharge: 2018-12-18 | Disposition: A | Discharge status: Home / Self Care | Payer: Medicare Other | Source: Ambulatory Visit | Attending: Pulmonary Disease | Admitting: Pulmonary Disease

## 2018-12-18 DIAGNOSIS — J432 Centrilobular emphysema: Secondary | ICD-10-CM | POA: Insufficient documentation

## 2018-12-20 ENCOUNTER — Encounter (HOSPITAL_COMMUNITY)
Admission: RE | Admit: 2018-12-20 | Discharge: 2018-12-20 | Disposition: A | Discharge status: Home / Self Care | Payer: Medicare Other | Source: Ambulatory Visit | Attending: Pulmonary Disease | Admitting: Pulmonary Disease

## 2018-12-20 DIAGNOSIS — J432 Centrilobular emphysema: Secondary | ICD-10-CM

## 2018-12-20 NOTE — Progress Notes (Signed)
Pulmonary Individual Treatment Plan  Patient Details  Name: Yolanda Morris MRN: [id number] Date of Birth: Aug 12, 1946 Referring Provider:     Pulmonary Rehab Walk Test from 11/15/2018 in [facility]  Referring Provider  mcquaid      Initial Encounter Date:    Pulmonary Rehab Walk Test from 11/15/2018 in [facility]  Date  11/15/18      Visit Diagnosis: Centrilobular emphysema ([place])  Patient's Home Medications on Admission:   Current Outpatient Medications:  .  acetaminophen (TYLENOL) 325 MG tablet, Take 650 mg by mouth every 6 (six) hours as needed., Disp: , Rfl:  .  albuterol (PROAIR HFA) 108 (90 Base) MCG/ACT inhaler, Inhale 1-2 puffs into the lungs every 6 (six) hours as needed for wheezing or shortness of breath., Disp: 1 Inhaler, Rfl: 3 .  albuterol (PROVENTIL HFA;VENTOLIN HFA) 108 (90 Base) MCG/ACT inhaler, Inhale 2 puffs into the lungs every 6 (six) hours as needed for wheezing or shortness of breath., Disp: 3 Inhaler, Rfl: 3 .  albuterol (PROVENTIL) (2.5 MG/3ML) 0.083% nebulizer solution, Take 3 mLs (2.5 mg total) by nebulization every 6 (six) hours as needed for wheezing or shortness of breath., Disp: 360 mL, Rfl: 3 .  aspirin EC 81 MG EC tablet, Take 81 mg by mouth daily.  , Disp: , Rfl:  .  calcium carbonate (OS-CAL - DOSED IN MG OF ELEMENTAL CALCIUM) 1250 (500 Ca) MG tablet, Take 1 tablet by mouth daily with breakfast., Disp: , Rfl:  .  chlorpheniramine (CHLOR-TRIMETON) 4 MG tablet, Take 4 mg by mouth daily as needed for allergies., Disp: , Rfl:  .  Cholecalciferol (VITAMIN D3) 1000 UNITS CAPS, Take 1 capsule by mouth daily.  , Disp: , Rfl:  .  Cranberry 500 MG TABS, Take 1 tablet by mouth daily., Disp: , Rfl:  .  formoterol (PERFOROMIST) 20 MCG/2ML nebulizer solution, Take 2 mLs (20 mcg total) by nebulization 2 (two) times daily., Disp: 540 mL, Rfl: 3 .  losartan (COZAAR) 100 MG tablet, Take 100 mg by mouth daily.  , Disp: , Rfl:  .  Lysine 500 MG CAPS, Take 1 capsule by mouth daily.  , Disp: , Rfl:  .  metFORMIN (GLUCOPHAGE-XR) 500 MG 24 hr tablet, , Disp: , Rfl:  .  Multiple Vitamins-Minerals (CENTRUM SILVER PO), Take 1 tablet by mouth daily.  , Disp: , Rfl:  .  omeprazole (PRILOSEC) 20 MG capsule, Take 20 mg by mouth every other day. , Disp: , Rfl:  .  Revefenacin (YUPELRI) 175 MCG/3ML SOLN, Inhale 3 mLs into the lungs 2 (two) times daily., Disp: 180 mL, Rfl: 0 .  simvastatin (ZOCOR) 40 MG tablet, Take 40 mg by mouth at bedtime.  , Disp: , Rfl:  .  Spacer/Aero-Holding Chambers (AEROCHAMBER Z-STAT PLUS) inhaler, Use as instructed, Disp: 1 each, Rfl: 0 .  umeclidinium-vilanterol (ANORO ELLIPTA) 62.5-25 MCG/INH AEPB, USE 1 INHALATION DAILY, Disp: 30 each, Rfl: 6 .  valACYclovir (VALTREX) 500 MG tablet, Take 500 mg by mouth 2 (two) times daily as needed.  , Disp: , Rfl:  .  Zoledronic Acid (RECLAST IV), Inject into the vein. Every 12 months, Disp: , Rfl:   Past Medical History: Past Medical History:  Diagnosis Date  . Cancer ([place])    skin  . COPD (chronic obstructive pulmonary disease) ([place])   . DM (diabetes mellitus) ([place])   . Dyspnea    increased activity   . Genital herpes   .  Hyperlipidemia   . Migraines   . Osteoporosis   . Unspecified essential hypertension     Tobacco Use: Social History   Tobacco Use  Smoking Status Former Smoker  . Packs/day: 2.00  . Years: 25.00  . Pack years: 50.00  . Types: Cigarettes  . Last attempt to quit: 10/17/1996  . Years since quitting: 22.1  Smokeless Tobacco Never Used    Labs: Recent Review Flowsheet Data    There is no flowsheet data to display.      Capillary Blood Glucose: No results found for: GLUCAP POCT Glucose    Row Name 12/04/18 1601 12/18/18 1435           POCT Blood Glucose   Pre-Exercise  116 mg/dL  150 mg/dL      Post-Exercise  101 mg/dL  90 mg/dL         Pulmonary Assessment Scores: Pulmonary Assessment Scores     Row Name 11/09/18 1055 11/15/18 1631       ADL UCSD   ADL Phase  Entry  -    SOB Score total  36  -      CAT Score   CAT Score  20  -      mMRC Score   mMRC Score  -  1       Pulmonary Function Assessment: Pulmonary Function Assessment - 11/09/18 1053      Breath   Bilateral Breath Sounds  Clear;Decreased    Shortness of Breath  Yes;Panic with Shortness of Breath       Exercise Target Goals: Exercise Program Goal: Individual exercise prescription set using results from initial 6 min walk test and THRR while considering  patient's activity barriers and safety.   Exercise Prescription Goal: Initial exercise prescription builds to 30-45 minutes a day of aerobic activity, 2-3 days per week.  Home exercise guidelines will be given to patient during program as part of exercise prescription that the participant will acknowledge.  Activity Barriers & Risk Stratification: Activity Barriers & Cardiac Risk Stratification - 11/09/18 1038      Activity Barriers & Cardiac Risk Stratification   Activity Barriers  Back Problems;Deconditioning;Neck/Spine Problems;Shortness of Breath;History of Falls;Balance Concerns       6 Minute Walk: 6 Minute Walk    Row Name 11/15/18 1556 12/06/18 0704       6 Minute Walk   Phase  Initial  Mid Program    Distance  1200 feet  1200 feet    Distance Feet Change  -  0 ft    Walk Time  6 minutes  6 minutes    # of Rest Breaks  0  0    MPH  -  2.27    METS  -  2.76    RPE  12  -    Perceived Dyspnea   2  -    Symptoms  No  No    Resting HR  85 bpm  116 bpm    Resting BP  128/90  -    Resting Oxygen Saturation   91 %  89 %    Exercise Oxygen Saturation  during 6 min walk  87 %  86 %    Max Ex. HR  128 bpm  143 bpm    Max Ex. BP  144/90  -      Interval HR   1 Minute HR  92  124    2 Minute HR  95  123  3 Minute HR  122  134    4 Minute HR  112  137    5 Minute HR  128  138    6 Minute HR  120  143    2 Minute Post HR  114  -     Interval Heart Rate?  Yes  Yes      Interval Oxygen   Interval Oxygen?  Yes  Yes    Baseline Oxygen Saturation %  91 %  89 %    1 Minute Oxygen Saturation %  89 %  92 %    1 Minute Liters of Oxygen  0 L  0 L    2 Minute Oxygen Saturation %  88 %  90 %    2 Minute Liters of Oxygen  0 L  0 L    3 Minute Oxygen Saturation %  91 %  86 %    3 Minute Liters of Oxygen  0 L  0 L    4 Minute Oxygen Saturation %  87 %  86 %    4 Minute Liters of Oxygen  0 L  2 L    5 Minute Oxygen Saturation %  88 %  89 %    5 Minute Liters of Oxygen  0 L  2 L    6 Minute Oxygen Saturation %  87 %  90 %    6 Minute Liters of Oxygen  0 L  2 L    2 Minute Post Oxygen Saturation %  92 %  -    2 Minute Post Liters of Oxygen  0 L  -       Oxygen Initial Assessment: Oxygen Initial Assessment - 11/15/18 1631      Home Oxygen   Home Oxygen Device  None      Initial 6 min Walk   Oxygen Used  None      Program Oxygen Prescription   Program Oxygen Prescription  None   patient desaturated to 87% during 6MWT-will re-evaluate during exercise      Oxygen Re-Evaluation: Oxygen Re-Evaluation    Row Name 12/18/18 0743             Program Oxygen Prescription   Program Oxygen Prescription  Continuous;E-Tanks       Liters per minute  2       Comments  only while walking         Home Oxygen   Home Oxygen Device  None will discuss with Dr. Lake Bells at her next appointment       Sleep Oxygen Prescription  None       Home Exercise Oxygen Prescription  - 2 liters while walking if Dr. [place] Bells orders oxygen       Home at Rest Exercise Oxygen Prescription  None       Compliance with Home Oxygen Use  Yes         Goals/Expected Outcomes   Short Term Goals  To learn and understand importance of maintaining oxygen saturations>88%;To learn and understand importance of monitoring SPO2 with pulse oximeter and demonstrate accurate use of the pulse oximeter.;To learn and demonstrate proper pursed lip breathing techniques or  other breathing techniques.;To learn and demonstrate proper use of respiratory medications       Long  Term Goals  Verbalizes importance of monitoring SPO2 with pulse oximeter and return demonstration;Maintenance of O2 saturations>88%;Exhibits proper breathing techniques, such as pursed lip breathing or other method taught during  program session;Compliance with respiratory medication;Demonstrates proper use of MDI's       Goals/Expected Outcomes  potential oxygen at home for exercise          Oxygen Discharge (Final Oxygen Re-Evaluation): Oxygen Re-Evaluation - 12/18/18 0743      Program Oxygen Prescription   Program Oxygen Prescription  Continuous;E-Tanks    Liters per minute  2    Comments  only while walking      Home Oxygen   Home Oxygen Device  None   will discuss with Dr. Lake Bells at her next appointment   Sleep Oxygen Prescription  None    Home Exercise Oxygen Prescription  --   2 liters while walking if Dr. Lake Bells orders oxygen   Home at Rest Exercise Oxygen Prescription  None    Compliance with Home Oxygen Use  Yes      Goals/Expected Outcomes   Short Term Goals  To learn and understand importance of maintaining oxygen saturations>88%;To learn and understand importance of monitoring SPO2 with pulse oximeter and demonstrate accurate use of the pulse oximeter.;To learn and demonstrate proper pursed lip breathing techniques or other breathing techniques.;To learn and demonstrate proper use of respiratory medications    Long  Term Goals  Verbalizes importance of monitoring SPO2 with pulse oximeter and return demonstration;Maintenance of O2 saturations>88%;Exhibits proper breathing techniques, such as pursed lip breathing or other method taught during program session;Compliance with respiratory medication;Demonstrates proper use of MDI's    Goals/Expected Outcomes  potential oxygen at home for exercise       Initial Exercise Prescription: Initial Exercise Prescription - 11/15/18  1500      Date of Initial Exercise RX and Referring Provider   Date  11/15/18    Referring Provider  mcquaid      Recumbant Bike   Level  1    Watts  10    Minutes  17      NuStep   Level  2    SPM  80    Minutes  17    METs  1.5      Track   Laps  10    Minutes  17      Prescription Details   Frequency (times per week)  2    Duration  Progress to 45 minutes of aerobic exercise without signs/symptoms of physical distress      Intensity   THRR 40-80% of Max Heartrate  59-118    Ratings of Perceived Exertion  11-15    Perceived Dyspnea  0-4      Resistance Training   Training Prescription  Yes    Weight  orange bands    Reps  10-15       Perform Capillary Blood Glucose checks as needed.  Exercise Prescription Changes: Exercise Prescription Changes    Row Name 12/04/18 1600 12/13/18 1438 12/18/18 1400         Response to Exercise   Blood Pressure (Admit)  114/78  124/64  -     Blood Pressure (Exercise)  138/76  154/80  -     Blood Pressure (Exit)  130/80  110/72  -     Heart Rate (Admit)  88 bpm  85 bpm  -     Heart Rate (Exercise)  132 bpm  116 bpm  -     Heart Rate (Exit)  84 bpm at end of cool down HR 119. Gave her water and feet elevated  83 bpm  -  Oxygen Saturation (Admit)  93 %  92 %  -     Oxygen Saturation (Exercise)  89 %  93 %  -     Oxygen Saturation (Exit)  92 %  93 %  -     Rating of Perceived Exertion (Exercise)  15  13  -     Perceived Dyspnea (Exercise)  2  1  -     Duration  Progress to 45 minutes of aerobic exercise without signs/symptoms of physical distress  Progress to 45 minutes of aerobic exercise without signs/symptoms of physical distress  -     Intensity  Other (comment)  THRR unchanged  -       Progression   Progression  Continue to progress workloads to maintain intensity without signs/symptoms of physical distress. 40-80% of HRR  Continue to progress workloads to maintain intensity without signs/symptoms of physical distress.   -       Resistance Training   Training Prescription  Yes  Yes  -     Weight  orange bands  orange bands  -     Reps  10-15  10-15  -     Time  10 Minutes  10 Minutes  -       Treadmill   MPH  -  2  -     Grade  -  3  -     Minutes  -  17  -       Recumbant Bike   Level  1  -  -     Watts  10  -  -     Minutes  17  -  -       NuStep   Level  2  2  -     SPM  80  80  -     Minutes  17  17  -     METs  2.6  2.9  -       Track   Laps  - 6 mintue walk test  -  -        Exercise Comments:   Exercise Goals and Review: Exercise Goals    Row Name 11/09/18 1042             Exercise Goals   Increase Physical Activity  Yes       Intervention  Provide advice, education, support and counseling about physical activity/exercise needs.;Develop an individualized exercise prescription for aerobic and resistive training based on initial evaluation findings, risk stratification, comorbidities and participant's personal goals.       Expected Outcomes  Short Term: Attend rehab on a regular basis to increase amount of physical activity.;Long Term: Add in home exercise to make exercise part of routine and to increase amount of physical activity.;Long Term: Exercising regularly at least 3-5 days a week.       Increase Strength and Stamina  Yes       Intervention  Provide advice, education, support and counseling about physical activity/exercise needs.;Develop an individualized exercise prescription for aerobic and resistive training based on initial evaluation findings, risk stratification, comorbidities and participant's personal goals.       Expected Outcomes  Short Term: Increase workloads from initial exercise prescription for resistance, speed, and METs.;Short Term: Perform resistance training exercises routinely during rehab and add in resistance training at home;Long Term: Improve cardiorespiratory fitness, muscular endurance and strength as measured by increased METs and functional  capacity (6MWT)  Able to understand and use rate of perceived exertion (RPE) scale  Yes       Intervention  Provide education and explanation on how to use RPE scale       Expected Outcomes  Short Term: Able to use RPE daily in rehab to express subjective intensity level;Long Term:  Able to use RPE to guide intensity level when exercising independently       Able to understand and use Dyspnea scale  Yes       Intervention  Provide education and explanation on how to use Dyspnea scale       Expected Outcomes  Short Term: Able to use Dyspnea scale daily in rehab to express subjective sense of shortness of breath during exertion;Long Term: Able to use Dyspnea scale to guide intensity level when exercising independently       Knowledge and understanding of Target Heart Rate Range (THRR)  Yes       Intervention  Provide education and explanation of THRR including how the numbers were predicted and where they are located for reference       Expected Outcomes  Short Term: Able to state/look up THRR;Long Term: Able to use THRR to govern intensity when exercising independently;Short Term: Able to use daily as guideline for intensity in rehab       Understanding of Exercise Prescription  Yes       Intervention  Provide education, explanation, and written materials on patient's individual exercise prescription       Expected Outcomes  Short Term: Able to explain program exercise prescription;Long Term: Able to explain home exercise prescription to exercise independently          Exercise Goals Re-Evaluation : Exercise Goals Re-Evaluation    Row Name 12/18/18 0742             Exercise Goal Re-Evaluation   Exercise Goals Review  Increase Physical Activity;Increase Strength and Stamina;Able to understand and use rate of perceived exertion (RPE) scale;Able to understand and use Dyspnea scale;Knowledge and understanding of Target Heart Rate Range (THRR);Understanding of Exercise Prescription        Comments  Patient is progressing well in rehab. She shows motivation and determination. She has progressed from walking track to the treadmill. Patients MET average is low-moderate. We will discuss importance of home exercise. Will cont to monitor and motivate.       Expected Outcomes  Through exercise at rehab and at home, the patient will decrease shortness of breath with daily activities and feel confident in carrying out an exercise regime at home.           Discharge Exercise Prescription (Final Exercise Prescription Changes): Exercise Prescription Changes - 12/18/18 1400      Response to Exercise   Blood Pressure (Admit)  --    Blood Pressure (Exercise)  --    Blood Pressure (Exit)  --    Heart Rate (Admit)  --    Heart Rate (Exercise)  --    Heart Rate (Exit)  --    Oxygen Saturation (Admit)  --    Oxygen Saturation (Exercise)  --    Oxygen Saturation (Exit)  --    Rating of Perceived Exertion (Exercise)  --    Perceived Dyspnea (Exercise)  --    Duration  --    Intensity  --      Progression   Progression  --      Horticulturist, commercial Prescription  --  Weight  --    Reps  --    Time  --      Treadmill   MPH  --    Grade  --    Minutes  --      NuStep   Level  --    SPM  --    Minutes  --    METs  --       Nutrition:  Target Goals: Understanding of nutrition guidelines, daily intake of sodium '1500mg'$ , cholesterol '200mg'$ , calories 30% from fat and 7% or less from saturated fats, daily to have 5 or more servings of fruits and vegetables.  Biometrics: Pre Biometrics - 11/09/18 1047      Pre Biometrics   Grip Strength  21 kg        Nutrition Therapy Plan and Nutrition Goals: Nutrition Therapy & Goals - 11/30/18 0946      Nutrition Therapy   Diet  carb modified       Personal Nutrition Goals   Nutrition Goal  Identify food quantities necessary to achieve wt loss of  -2# per week to a goal wt loss of 2.7-10.9 kg (6-24 lb) at graduation  from pulmonary rehab.    Personal Goal #2  Pt to weigh and measure portions for accuracy    Personal Goal #3  Pt to build a healthy plate including fruits, vegetables, whole grains, and low-fat dairy products in a healthy meal plan.      Intervention Plan   Intervention  Prescribe, educate and counsel regarding individualized specific dietary modifications aiming towards targeted core components such as weight, hypertension, lipid management, diabetes, heart failure and other comorbidities.    Expected Outcomes  Short Term Goal: Understand basic principles of dietary content, such as calories, fat, sodium, cholesterol and nutrients.;Long Term Goal: Adherence to prescribed nutrition plan.       Nutrition Assessments:   Nutrition Goals Re-Evaluation:   Nutrition Goals Discharge (Final Nutrition Goals Re-Evaluation):   Psychosocial: Target Goals: Acknowledge presence or absence of significant depression and/or stress, maximize coping skills, provide positive support system. Participant is able to verbalize types and ability to use techniques and skills needed for reducing stress and depression.  Initial Review & Psychosocial Screening: Initial Psych Review & Screening - 11/09/18 1205      Initial Review   Comments  pt husband passed in 09/2017 and pt states it has been an adjustment and stressful dealing with health issues by herself as well and rebudgeting for a one income house.      Family Dynamics   Comments  pt husband passed in 09/2017. pt now lives alone with dog who also has medical issues.       Screening Interventions   Interventions  To provide support and resources with identified psychosocial needs       Quality of Life Scores:  Scores of 19 and below usually indicate a poorer quality of life in these areas.  A difference of  2-3 points is a clinically meaningful difference.  A difference of 2-3 points in the total score of the Quality of Life Index has been associated  with significant improvement in overall quality of life, self-image, physical symptoms, and general health in studies assessing change in quality of life.  PHQ-9: Recent Review Flowsheet Data    Depression screen [facility] 2/9 11/09/2018   Decreased Interest 0   Down, Depressed, Hopeless 0   PHQ - 2 Score 0   Altered sleeping 0  Tired, decreased energy 1   Change in appetite 3   Feeling bad or failure about yourself  0   Trouble concentrating 2   Moving slowly or fidgety/restless 0   Suicidal thoughts 0   PHQ-9 Score 6   Difficult doing work/chores Somewhat difficult     Interpretation of Total Score  Total Score Depression Severity:  1-4 = Minimal depression, 5-9 = Mild depression, 10-14 = Moderate depression, 15-19 = Moderately severe depression, 20-27 = Severe depression   Psychosocial Evaluation and Intervention: Psychosocial Evaluation - 11/09/18 1212      Psychosocial Evaluation & Interventions   Comments  Pt recently lost her husband and has been adjusting to life and income without him. pt also has a dog that is older in age and has some health problems but she states she is cherishing her time with him. Pt does have a sister who she maintains close contact with daily and who checks on her often. Pt likes to read, but states that doesnt as much as before her husband passed, but she watched movies and tv every evening with her dog which she enjoys.    Expected Outcomes  pt will become more involved in things that she used to participate in before her husband got sick. States she wants to read more, especially a book that was given to her by her neice.    Continue Psychosocial Services   Follow up required by staff       Psychosocial Re-Evaluation: Psychosocial Re-Evaluation    [place] Name 11/20/18 1704 12/17/18 1332           Psychosocial Re-Evaluation   Current issues with  Current Stress Concerns  Current Stress Concerns      Comments  Has recently lost her husband which has  been an adjustment, she has a dog who is a great comfort and has friends and family  Dealing with the loss of her husband, is doing well, smiles and seems happy.  Presently no barriers to participation in pulmonary rehab.      Expected Outcomes  To deal with grief and stress in a healthy manner  Continues to have no psychosocial barriers to participation in pulmonary rehab      Interventions  Encouraged to attend Pulmonary Rehabilitation for the exercise;Relaxation education;Stress management education  Encouraged to attend Pulmonary Rehabilitation for the exercise;Relaxation education;Stress management education      Continue Psychosocial Services   Follow up required by staff  Follow up required by staff      Comments  Is just beginning program, has not exercised yet, will continue to monitor psychsocial for barriers  No psychosocial barriers are present at this time.        Initial Review   Source of Stress Concerns  Chronic Illness;Financial  Chronic Illness;Financial         Psychosocial Discharge (Final Psychosocial Re-Evaluation): Psychosocial Re-Evaluation - 12/17/18 1332      Psychosocial Re-Evaluation   Current issues with  Current Stress Concerns    Comments  Dealing with the loss of her husband, is doing well, smiles and seems happy.  Presently no barriers to participation in pulmonary rehab.    Expected Outcomes  Continues to have no psychosocial barriers to participation in pulmonary rehab    Interventions  Encouraged to attend Pulmonary Rehabilitation for the exercise;Relaxation education;Stress management education    Continue Psychosocial Services   Follow up required by staff    Comments  No psychosocial barriers are present  at this time.      Initial Review   Source of Stress Concerns  Chronic Illness;Financial       Education: Education Goals: Education classes will be provided on a weekly basis, covering required topics. Participant will state understanding/return  demonstration of topics presented.  Learning Barriers/Preferences: Learning Barriers/Preferences - 11/09/18 1104      Learning Barriers/Preferences   Learning Barriers  Sight    Learning Preferences  Audio;Computer/Internet;Group Instruction;Individual Instruction;Pictoral;Skilled Demonstration;Verbal Instruction;Video;Written Material       Education Topics: Risk Factor Reduction:  -Group instruction that is supported by a PowerPoint presentation. Instructor discusses the definition of a risk factor, different risk factors for pulmonary disease, and how the heart and lungs work together.     Nutrition for Pulmonary Patient:  -Group instruction provided by PowerPoint slides, verbal discussion, and written materials to support subject matter. The instructor gives an explanation and review of healthy diet recommendations, which includes a discussion on weight management, recommendations for fruit and vegetable consumption, as well as protein, fluid, caffeine, fiber, sodium, sugar, and alcohol. Tips for eating when patients are short of breath are discussed.   Pursed Lip Breathing:  -Group instruction that is supported by demonstration and informational handouts. Instructor discusses the benefits of pursed lip and diaphragmatic breathing and detailed demonstration on how to preform both.     Oxygen Safety:  -Group instruction provided by PowerPoint, verbal discussion, and written material to support subject matter. There is an overview of "What is Oxygen" and "Why do we need it".  Instructor also reviews how to create a safe environment for oxygen use, the importance of using oxygen as prescribed, and the risks of noncompliance. There is a brief discussion on traveling with oxygen and resources the patient may utilize.   PULMONARY REHAB OTHER RESPIRATORY from 12/13/2018 in [place]  Date  11/22/18  Educator  Cloyde Reams  Instruction Review Code  1- Verbalizes  Understanding      Oxygen Equipment:  -Group instruction provided by [employer] utilizing handouts, written materials, and Insurance underwriter.   PULMONARY REHAB OTHER RESPIRATORY from 12/13/2018 in [place]  Date  11/29/18  Educator  Ace Gins Rep  Instruction Review Code  1- Verbalizes Understanding      Signs and Symptoms:  -Group instruction provided by written material and verbal discussion to support subject matter. Warning signs and symptoms of infection, stroke, and heart attack are reviewed and when to call the physician/911 reinforced. Tips for preventing the spread of infection discussed.   PULMONARY REHAB OTHER RESPIRATORY from 12/13/2018 in [place]  Date  12/13/18  Educator  Remo Lipps  Instruction Review Code  1- Verbalizes Understanding      Advanced Directives:  -Group instruction provided by verbal instruction and written material to support subject matter. Instructor reviews Advanced Directive laws and proper instruction for filling out document.   Pulmonary Video:  -Group video education that reviews the importance of medication and oxygen compliance, exercise, good nutrition, pulmonary hygiene, and pursed lip and diaphragmatic breathing for the pulmonary patient.   Exercise for the Pulmonary Patient:  -Group instruction that is supported by a PowerPoint presentation. Instructor discusses benefits of exercise, core components of exercise, frequency, duration, and intensity of an exercise routine, importance of utilizing pulse oximetry during exercise, safety while exercising, and options of places to exercise outside of rehab.     PULMONARY REHAB OTHER RESPIRATORY from 12/13/2018 in [facility]  [place]  Date  12/06/18  Educator  molly  Instruction Review Code  1- Verbalizes Understanding      Pulmonary Medications:  -Verbally interactive group education provided by  instructor with focus on inhaled medications and proper administration.   Anatomy and Physiology of the Respiratory System and Intimacy:  -Group instruction provided by PowerPoint, verbal discussion, and written material to support subject matter. Instructor reviews respiratory cycle and anatomical components of the respiratory system and their functions. Instructor also reviews differences in obstructive and restrictive respiratory diseases with examples of each. Intimacy, Sex, and Sexuality differences are reviewed with a discussion on how relationships can change when diagnosed with pulmonary disease. Common sexual concerns are reviewed.   MD DAY -A group question and answer session with a medical doctor that allows participants to ask questions that relate to their pulmonary disease state.   OTHER EDUCATION -Group or individual verbal, written, or video instructions that support the educational goals of the pulmonary rehab program.   Holiday Eating Survival Tips:  -Group instruction provided by PowerPoint slides, verbal discussion, and written materials to support subject matter. The instructor gives patients tips, tricks, and techniques to help them not only survive but enjoy the holidays despite the onslaught of food that accompanies the holidays.   Knowledge Questionnaire Score: Knowledge Questionnaire Score - 11/09/18 1059      Knowledge Questionnaire Score   Pre Score  18/18       Core Components/Risk Factors/Patient Goals at Admission: Personal Goals and Risk Factors at Admission - 11/09/18 1105      Core Components/Risk Factors/Patient Goals on Admission    Weight Management  Weight Gain;Yes    Intervention  Weight Management: Develop a combined nutrition and exercise program designed to reach desired caloric intake, while maintaining appropriate intake of nutrient and fiber, sodium and fats, and appropriate energy expenditure required for the weight goal.    Admit Weight   158 lb 11.7 oz (72 kg)    Goal Weight: Short Term  148 lb (67.1 kg)    Expected Outcomes  Short Term: Continue to assess and modify interventions until short term weight is achieved;Long Term: Adherence to nutrition and physical activity/exercise program aimed toward attainment of established weight goal;Weight Loss: Understanding of general recommendations for a balanced deficit meal plan, which promotes 1-2 lb weight loss per week and includes a negative energy balance of [phone] kcal/d;Understanding of distribution of calorie intake throughout the day with the consumption of 4-5 meals/snacks;Understanding recommendations for meals to include 15-35% energy as protein, 25-35% energy from fat, 35-60% energy from carbohydrates, less than '200mg'$  of dietary cholesterol, 20-35 gm of total fiber daily    Improve shortness of breath with ADL's  Yes    Intervention  Provide education, individualized exercise plan and daily activity instruction to help decrease symptoms of SOB with activities of daily living.    Expected Outcomes  Short Term: Improve cardiorespiratory fitness to achieve a reduction of symptoms when performing ADLs;Long Term: Be able to perform more ADLs without symptoms or delay the onset of symptoms    Hypertension  Yes    Intervention  Provide education on lifestyle modifcations including regular physical activity/exercise, weight management, moderate sodium restriction and increased consumption of fresh fruit, vegetables, and low fat dairy, alcohol moderation, and smoking cessation.;Monitor prescription use compliance.    Expected Outcomes  Short Term: Continued assessment and intervention until BP is < 140/19m HG in hypertensive participants. < 130/868mHG in hypertensive  participants with diabetes, heart failure or chronic kidney disease.;Long Term: Maintenance of blood pressure at goal levels.    Lipids  Yes    Intervention  Provide education and support for participant on nutrition &  aerobic/resistive exercise along with prescribed medications to achieve LDL '70mg'$ , HDL >'40mg'$ .    Expected Outcomes  Short Term: Participant states understanding of desired cholesterol values and is compliant with medications prescribed. Participant is following exercise prescription and nutrition guidelines.;Long Term: Cholesterol controlled with medications as prescribed, with individualized exercise RX and with personalized nutrition plan. Value goals: LDL < '70mg'$ , HDL > 40 mg.    Stress  Yes    Intervention  Offer individual and/or small group education and counseling on adjustment to heart disease, stress management and health-related lifestyle change. Teach and support self-help strategies.;Refer participants experiencing significant psychosocial distress to appropriate mental health specialists for further evaluation and treatment. When possible, include family members and significant others in education/counseling sessions.    Expected Outcomes  Short Term: Participant demonstrates changes in health-related behavior, relaxation and other stress management skills, ability to obtain effective social support, and compliance with psychotropic medications if prescribed.;Long Term: Emotional wellbeing is indicated by absence of clinically significant psychosocial distress or social isolation.       Core Components/Risk Factors/Patient Goals Review:  Goals and Risk Factor Review    Row Name 11/20/18 1708 12/17/18 1336           Core Components/Risk Factors/Patient Goals Review   Personal Goals Review  Weight Management/Obesity;Improve shortness of breath with ADL's;Increase knowledge of respiratory medications and ability to use respiratory devices properly.;Stress;Develop more efficient breathing techniques such as purse lipped breathing and diaphragmatic breathing and practicing self-pacing with activity.  Weight Management/Obesity;Improve shortness of breath with ADL's;Increase knowledge of  respiratory medications and ability to use respiratory devices properly.;Develop more efficient breathing techniques such as purse lipped breathing and diaphragmatic breathing and practicing self-pacing with activity.      Review  Has not started the exercise sessions as of yet, but will begin 11/22/2018  Has attended 7 exercise sessions, is progressing well with workload increases on the nustep, treadmill, and recumbent bike, works hard      Expected Outcomes  See admission goals  See admission goals         Core Components/Risk Factors/Patient Goals at Discharge (Final Review):  Goals and Risk Factor Review - 12/17/18 1336      Core Components/Risk Factors/Patient Goals Review   Personal Goals Review  Weight Management/Obesity;Improve shortness of breath with ADL's;Increase knowledge of respiratory medications and ability to use respiratory devices properly.;Develop more efficient breathing techniques such as purse lipped breathing and diaphragmatic breathing and practicing self-pacing with activity.    Review  Has attended 7 exercise sessions, is progressing well with workload increases on the nustep, treadmill, and recumbent bike, works hard    Expected Outcomes  See admission goals       ITP Comments: ITP Comments    Row Name 11/09/18 [phone]           ITP Comments  Dr. Jennet Maduro, Medical Director, Pulmonary Rehab          Comments: ITP REVIEW Pt is making expected progress toward pulmonary rehab goals after completing 7 sessions. Recommend continued exercise, life style modification, education, and utilization of breathing techniques to increase stamina and strength and decrease shortness of breath with exertion.

## 2018-12-20 NOTE — Progress Notes (Signed)
Daily Session Note  Patient Details  Name: Yolanda Morris MRN: [id number] Date of Birth: 1946-01-26 Referring Provider:     Pulmonary Rehab Walk Test from 11/15/2018 in [place]  Referring Provider  mcquaid      Encounter Date: 12/20/2018  Check In: Session Check In - 12/20/18 1330      Check-In   Supervising physician immediately available to respond to emergencies  [facility] immediately available    Physician(s)  Dr. Wynelle Cleveland    Location  [facility]    Staff Present  Joycelyn Man RN, BSN;Carolena Fairbank, MS, ACSM RCEP, Exercise Physiologist;Dalton Kris Mouton, MS, Exercise Physiologist;Joan Leonia Reeves, RN, Roque Cash, RN    Medication changes reported      No    Fall or balance concerns reported     No    Tobacco Cessation  No Change    Warm-up and Cool-down  Performed as group-led instruction    Resistance Training Performed  Yes    VAD Patient?  No    PAD/SET Patient?  No      Pain Assessment   Currently in Pain?  No/denies    Multiple Pain Sites  No       Capillary Blood Glucose: No results found for this or any previous visit (from the past 24 hour(s)).    Social History   Tobacco Use  Smoking Status Former Smoker  . Packs/day: 2.00  . Years: 25.00  . Pack years: 50.00  . Types: Cigarettes  . Last attempt to quit: 10/17/1996  . Years since quitting: 22.1  Smokeless Tobacco Never Used    Goals Met:  Exercise tolerated well  Goals Unmet:  Not Applicable  Comments: Service time is from 1:30p to 3:00p    Dr. Rush Farmer is Medical Director for Pulmonary Rehab at [facility].

## 2018-12-25 ENCOUNTER — Encounter (HOSPITAL_COMMUNITY): Payer: Medicare Other

## 2018-12-25 ENCOUNTER — Encounter (HOSPITAL_COMMUNITY)
Admission: RE | Admit: 2018-12-25 | Discharge: 2018-12-25 | Disposition: A | Discharge status: Home / Self Care | Payer: Medicare Other | Source: Ambulatory Visit | Attending: Pulmonary Disease | Admitting: Pulmonary Disease

## 2018-12-25 DIAGNOSIS — J432 Centrilobular emphysema: Secondary | ICD-10-CM

## 2018-12-25 NOTE — Progress Notes (Signed)
Daily Session Note  Patient Details  Name: Yolanda Morris MRN: [id number] Date of Birth: May 04, 1946 Referring Provider:     Pulmonary Rehab Walk Test from 11/15/2018 in [place]  Referring Provider  mcquaid      Encounter Date: 12/25/2018  Check In: Session Check In - 12/25/18 1120      Check-In   Supervising physician immediately available to respond to emergencies  [facility] immediately available    Physician(s)  Dr. Jonnie Finner    Location  [facility]    Staff Present  Su Hilt, MS, ACSM RCEP, Exercise Physiologist;Dalton Kris Mouton, MS, Exercise Physiologist;Joan Leonia Reeves, RN, Roque Cash, RN;Other    Medication changes reported      No    Fall or balance concerns reported     No    Tobacco Cessation  No Change    Warm-up and Cool-down  Not performed (comment)    Resistance Training Performed  Yes    VAD Patient?  No    PAD/SET Patient?  No      Pain Assessment   Currently in Pain?  No/denies    Multiple Pain Sites  No       Capillary Blood Glucose: No results found for this or any previous visit (from the past 24 hour(s)).    Social History   Tobacco Use  Smoking Status Former Smoker  . Packs/day: 2.00  . Years: 25.00  . Pack years: 50.00  . Types: Cigarettes  . Last attempt to quit: 10/17/1996  . Years since quitting: 22.2  Smokeless Tobacco Never Used    Goals Met:  Exercise tolerated well No report of cardiac concerns or symptoms Strength training completed today  Goals Unmet:  Not Applicable  Comments: Service time is from 1030 to 1215    Dr. Rush Farmer is Medical Director for Pulmonary Rehab at [facility].

## 2018-12-27 ENCOUNTER — Encounter (HOSPITAL_COMMUNITY): Payer: Medicare Other

## 2018-12-27 ENCOUNTER — Other Ambulatory Visit: Payer: Self-pay

## 2018-12-27 ENCOUNTER — Encounter (HOSPITAL_COMMUNITY)
Admission: RE | Admit: 2018-12-27 | Discharge: 2018-12-27 | Disposition: A | Discharge status: Home / Self Care | Payer: Medicare Other | Source: Ambulatory Visit | Attending: Pulmonary Disease | Admitting: Pulmonary Disease

## 2018-12-27 VITALS — Wt 160.9 lb

## 2018-12-27 DIAGNOSIS — J432 Centrilobular emphysema: Secondary | ICD-10-CM | POA: Diagnosis not present

## 2018-12-27 NOTE — Progress Notes (Signed)
Paislee Szatkowski 73 y.o. female   DOB: 02/01/1946 MRN: [id number]          Nutrition 1. Centrilobular emphysema ([place])    Past Medical History:  Diagnosis Date  . Cancer ([place])    skin  . COPD (chronic obstructive pulmonary disease) ([place])   . DM (diabetes mellitus) ([place])   . Dyspnea    increased activity   . Genital herpes   . Hyperlipidemia   . Migraines   . Osteoporosis   . Unspecified essential hypertension      Meds reviewed.  Current Outpatient Medications (Endocrine & Metabolic):  .  metFORMIN (GLUCOPHAGE-XR) 500 MG 24 hr tablet,  .  Zoledronic Acid (RECLAST IV), Inject into the vein. Every 12 months  Current Outpatient Medications (Cardiovascular):  .  losartan (COZAAR) 100 MG tablet, Take 100 mg by mouth daily.  .  simvastatin (ZOCOR) 40 MG tablet, Take 40 mg by mouth at bedtime.    Current Outpatient Medications (Respiratory):  .  albuterol (PROAIR HFA) 108 (90 Base) MCG/ACT inhaler, Inhale 1-2 puffs into the lungs every 6 (six) hours as needed for wheezing or shortness of breath. Marland Kitchen  albuterol (PROVENTIL HFA;VENTOLIN HFA) 108 (90 Base) MCG/ACT inhaler, Inhale 2 puffs into the lungs every 6 (six) hours as needed for wheezing or shortness of breath. Marland Kitchen  albuterol (PROVENTIL) (2.5 MG/3ML) 0.083% nebulizer solution, Take 3 mLs (2.5 mg total) by nebulization every 6 (six) hours as needed for wheezing or shortness of breath. .  chlorpheniramine (CHLOR-TRIMETON) 4 MG tablet, Take 4 mg by mouth daily as needed for allergies. .  formoterol (PERFOROMIST) 20 MCG/2ML nebulizer solution, Take 2 mLs (20 mcg total) by nebulization 2 (two) times daily. .  Revefenacin (YUPELRI) 175 MCG/3ML SOLN, Inhale 3 mLs into the lungs 2 (two) times daily. Marland Kitchen  umeclidinium-vilanterol (ANORO ELLIPTA) 62.5-25 MCG/INH AEPB, USE 1 INHALATION DAILY  Current Outpatient Medications (Analgesics):  .  acetaminophen (TYLENOL) 325 MG tablet, Take 650 mg by mouth every 6 (six) hours as needed. Marland Kitchen  aspirin EC 81 MG  EC tablet, Take 81 mg by mouth daily.     Current Outpatient Medications (Other):  .  calcium carbonate (OS-CAL - DOSED IN MG OF ELEMENTAL CALCIUM) 1250 (500 Ca) MG tablet, Take 1 tablet by mouth daily with breakfast. .  Cholecalciferol (VITAMIN D3) 1000 UNITS CAPS, Take 1 capsule by mouth daily.   .  Cranberry 500 MG TABS, Take 1 tablet by mouth daily. Marland Kitchen  Lysine 500 MG CAPS, Take 1 capsule by mouth daily.   .  Multiple Vitamins-Minerals (CENTRUM SILVER PO), Take 1 tablet by mouth daily.   Marland Kitchen  omeprazole (PRILOSEC) 20 MG capsule, Take 20 mg by mouth every other day.  Marland Kitchen  Spacer/Aero-Holding Chambers (AEROCHAMBER Z-STAT PLUS) inhaler, Use as instructed .  valACYclovir (VALTREX) 500 MG tablet, Take 500 mg by mouth 2 (two) times daily as needed.    Ht: Ht Readings from Last 1 Encounters:  11/13/18 5\' 2"  (1.575 m)     Wt:  Wt Readings from Last 3 Encounters:  12/04/18 160 lb 0.9 oz (72.6 kg)  11/13/18 158 lb 3.2 oz (71.8 kg)  11/09/18 158 lb 11.7 oz (72 kg)     BMI: 29.27    Current tobacco use? No  Labs:  Lipid Panel  No results found for: CHOL, TRIG, HDL, CHOLHDL, VLDL, LDLCALC, LDLDIRECT  No results found for: HGBA1C Note Spoke with pt. Pt is overweight.  Pt eats 3 meals a day, 2-3 snacks;  Most prepared at home.  Pt is making healthy food choices the majority of the time. Pt has an interest in eating more Mediterranean style. Discussed what foods count as mediterranean and distributed recipes and handouts. Pt's Rate Your Plate results reviewed with pt. Pt does not avoid salty food; uses canned/ convenience food. Pt adds salt to food. The role of sodium in lung disease reviewed with pt. Pt expressed understanding of the information reviewed. Pt is diabetic. Pt checks CBG's in the AM, has been running around ~104. Pt has been experimenting with holding her metformin in the evening before working out, this morning she was 154. Discussed with pt that she needs to keep a close watch on her  blood sugar before and after meals, as well as exercise. Additionally discussed carbohydrate counting with patient to ensure she is eating a consistent amount of carbohydrates across the day.   Nutrition Diagnosis  Food-and nutrition-related knowledge deficit related to lack of exposure to information as related to diagnosis of pulmonary disease  Overweight/obesity related to excessive energy intake as evidenced by a BMI of 29.27  Nutrition Intervention ? Pt's individual nutrition plan and goals reviewed with pt. ? Benefits of adopting healthy eating habits discussed when pt's Rate Your Plate reviewed.  Goal(s) 1. Identify food quantities necessary to achieve wt loss of  -2# per week to a goal wt loss of 6-24 lb at graduation from pulmonary rehab. 2. Pt to weigh and measure portions for accuracy. 3. Pt to build a healthy plate including fruits, vegetables, whole grains, and low-fat dairy products in a healthy meal plan.  Plan:  Pt to attend Pulmonary Nutrition class Will provide client-centered nutrition education as part of interdisciplinary care.    Monitor and Evaluate progress toward nutrition goal with team.   Laurina Bustle, MS, RD, LDN 12/27/2018 3:21 PM

## 2018-12-27 NOTE — Progress Notes (Signed)
Daily Session Note  Patient Details  Name: Rosanna Bickle MRN: [id number] Date of Birth: [birth year] Referring Provider:     Pulmonary Rehab Walk Test from 11/15/2018 in [place]  Referring Provider  mcquaid      Encounter Date: 12/27/2018  Check In: Session Check In - 12/27/18 1033      Check-In   Supervising physician immediately available to respond to emergencies  [facility] immediately available    Physician(s)  Dr. Cathlean Sauer    Location  [facility]    Staff Present  Joycelyn Man RN, BSN;[name], RN;Dalton Kris Mouton, MS, Exercise Physiologist;Coran Dipaola, MS, ACSM RCEP, Exercise Physiologist    Medication changes reported      No    Fall or balance concerns reported     No    Tobacco Cessation  No Change    Warm-up and Cool-down  Performed as group-led instruction    Resistance Training Performed  Yes    VAD Patient?  No    PAD/SET Patient?  No      Pain Assessment   Currently in Pain?  No/denies    Pain Score  0-No pain    Multiple Pain Sites  No       Capillary Blood Glucose: No results found for this or any previous visit (from the past 24 hour(s)).    Social History   Tobacco Use  Smoking Status Former Smoker  . Packs/day: 2.00  . Years: 25.00  . Pack years: 50.00  . Types: Cigarettes  . Last attempt to quit: 10/17/1996  . Years since quitting: 22.2  Smokeless Tobacco Never Used    Goals Met:  Exercise tolerated well  Goals Unmet:  Not Applicable  Comments: Service time is from 10:30a to 12:05p    Dr. Rush Farmer is Medical Director for Pulmonary Rehab at [facility].

## 2018-12-31 ENCOUNTER — Telehealth (HOSPITAL_COMMUNITY): Payer: Self-pay

## 2018-12-31 NOTE — Telephone Encounter (Signed)
Pt called informing pt of Cardiac and Pulmonary rehab closure for the next 2 weeks.   Carlisa Eble RN, BSN Cardiac and Pulmonary Rehab RN  

## 2019-01-01 ENCOUNTER — Encounter (HOSPITAL_COMMUNITY): Payer: Medicare Other

## 2019-01-01 ENCOUNTER — Telehealth (HOSPITAL_COMMUNITY): Payer: Self-pay

## 2019-01-03 ENCOUNTER — Encounter (HOSPITAL_COMMUNITY): Payer: Medicare Other

## 2019-01-07 ENCOUNTER — Encounter (HOSPITAL_COMMUNITY): Payer: Self-pay

## 2019-01-07 NOTE — Progress Notes (Signed)
Pulmonary Individual Treatment Plan  Patient Details  Name: Yolanda Morris MRN: [id number] Date of Birth: Aug 12, 1946 Referring Provider:     Pulmonary Rehab Walk Test from 11/15/2018 in [facility]  Referring Provider  mcquaid      Initial Encounter Date:    Pulmonary Rehab Walk Test from 11/15/2018 in [facility]  Date  11/15/18      Visit Diagnosis: Centrilobular emphysema ([place])  Patient's Home Medications on Admission:   Current Outpatient Medications:  .  acetaminophen (TYLENOL) 325 MG tablet, Take 650 mg by mouth every 6 (six) hours as needed., Disp: , Rfl:  .  albuterol (PROAIR HFA) 108 (90 Base) MCG/ACT inhaler, Inhale 1-2 puffs into the lungs every 6 (six) hours as needed for wheezing or shortness of breath., Disp: 1 Inhaler, Rfl: 3 .  albuterol (PROVENTIL HFA;VENTOLIN HFA) 108 (90 Base) MCG/ACT inhaler, Inhale 2 puffs into the lungs every 6 (six) hours as needed for wheezing or shortness of breath., Disp: 3 Inhaler, Rfl: 3 .  albuterol (PROVENTIL) (2.5 MG/3ML) 0.083% nebulizer solution, Take 3 mLs (2.5 mg total) by nebulization every 6 (six) hours as needed for wheezing or shortness of breath., Disp: 360 mL, Rfl: 3 .  aspirin EC 81 MG EC tablet, Take 81 mg by mouth daily.  , Disp: , Rfl:  .  calcium carbonate (OS-CAL - DOSED IN MG OF ELEMENTAL CALCIUM) 1250 (500 Ca) MG tablet, Take 1 tablet by mouth daily with breakfast., Disp: , Rfl:  .  chlorpheniramine (CHLOR-TRIMETON) 4 MG tablet, Take 4 mg by mouth daily as needed for allergies., Disp: , Rfl:  .  Cholecalciferol (VITAMIN D3) 1000 UNITS CAPS, Take 1 capsule by mouth daily.  , Disp: , Rfl:  .  Cranberry 500 MG TABS, Take 1 tablet by mouth daily., Disp: , Rfl:  .  formoterol (PERFOROMIST) 20 MCG/2ML nebulizer solution, Take 2 mLs (20 mcg total) by nebulization 2 (two) times daily., Disp: 540 mL, Rfl: 3 .  losartan (COZAAR) 100 MG tablet, Take 100 mg by mouth daily.  , Disp: , Rfl:  .  Lysine 500 MG CAPS, Take 1 capsule by mouth daily.  , Disp: , Rfl:  .  metFORMIN (GLUCOPHAGE-XR) 500 MG 24 hr tablet, , Disp: , Rfl:  .  Multiple Vitamins-Minerals (CENTRUM SILVER PO), Take 1 tablet by mouth daily.  , Disp: , Rfl:  .  omeprazole (PRILOSEC) 20 MG capsule, Take 20 mg by mouth every other day. , Disp: , Rfl:  .  Revefenacin (YUPELRI) 175 MCG/3ML SOLN, Inhale 3 mLs into the lungs 2 (two) times daily., Disp: 180 mL, Rfl: 0 .  simvastatin (ZOCOR) 40 MG tablet, Take 40 mg by mouth at bedtime.  , Disp: , Rfl:  .  Spacer/Aero-Holding Chambers (AEROCHAMBER Z-STAT PLUS) inhaler, Use as instructed, Disp: 1 each, Rfl: 0 .  umeclidinium-vilanterol (ANORO ELLIPTA) 62.5-25 MCG/INH AEPB, USE 1 INHALATION DAILY, Disp: 30 each, Rfl: 6 .  valACYclovir (VALTREX) 500 MG tablet, Take 500 mg by mouth 2 (two) times daily as needed.  , Disp: , Rfl:  .  Zoledronic Acid (RECLAST IV), Inject into the vein. Every 12 months, Disp: , Rfl:   Past Medical History: Past Medical History:  Diagnosis Date  . Cancer ([place])    skin  . COPD (chronic obstructive pulmonary disease) ([place])   . DM (diabetes mellitus) ([place])   . Dyspnea    increased activity   . Genital herpes   .  Hyperlipidemia   . Migraines   . Osteoporosis   . Unspecified essential hypertension     Tobacco Use: Social History   Tobacco Use  Smoking Status Former Smoker  . Packs/day: 2.00  . Years: 25.00  . Pack years: 50.00  . Types: Cigarettes  . Last attempt to quit: 10/17/1996  . Years since quitting: 22.2  Smokeless Tobacco Never Used    Labs: Recent Review Flowsheet Data    There is no flowsheet data to display.      Capillary Blood Glucose: No results found for: GLUCAP POCT Glucose    Row Name 12/04/18 1601 12/18/18 1435           POCT Blood Glucose   Pre-Exercise  116 mg/dL  150 mg/dL      Post-Exercise  101 mg/dL  90 mg/dL         Pulmonary Assessment Scores: Pulmonary Assessment Scores     Row Name 11/09/18 1055 11/15/18 1631       ADL UCSD   ADL Phase  Entry  -    SOB Score total  36  -      CAT Score   CAT Score  20  -      mMRC Score   mMRC Score  -  1       Pulmonary Function Assessment: Pulmonary Function Assessment - 11/09/18 1053      Breath   Bilateral Breath Sounds  Clear;Decreased    Shortness of Breath  Yes;Panic with Shortness of Breath       Exercise Target Goals: Exercise Program Goal: Individual exercise prescription set using results from initial 6 min walk test and THRR while considering  patient's activity barriers and safety.   Exercise Prescription Goal: Initial exercise prescription builds to 30-45 minutes a day of aerobic activity, 2-3 days per week.  Home exercise guidelines will be given to patient during program as part of exercise prescription that the participant will acknowledge.  Activity Barriers & Risk Stratification: Activity Barriers & Cardiac Risk Stratification - 11/09/18 1038      Activity Barriers & Cardiac Risk Stratification   Activity Barriers  Back Problems;Deconditioning;Neck/Spine Problems;Shortness of Breath;History of Falls;Balance Concerns       6 Minute Walk: 6 Minute Walk    Row Name 11/15/18 1556 12/06/18 0704       6 Minute Walk   Phase  Initial  Mid Program    Distance  1200 feet  1200 feet    Distance Feet Change  -  0 ft    Walk Time  6 minutes  6 minutes    # of Rest Breaks  0  0    MPH  -  2.27    METS  -  2.76    RPE  12  -    Perceived Dyspnea   2  -    Symptoms  No  No    Resting HR  85 bpm  116 bpm    Resting BP  128/90  -    Resting Oxygen Saturation   91 %  89 %    Exercise Oxygen Saturation  during 6 min walk  87 %  86 %    Max Ex. HR  128 bpm  143 bpm    Max Ex. BP  144/90  -      Interval HR   1 Minute HR  92  124    2 Minute HR  95  123  3 Minute HR  122  134    4 Minute HR  112  137    5 Minute HR  128  138    6 Minute HR  120  143    2 Minute Post HR  114  -     Interval Heart Rate?  Yes  Yes      Interval Oxygen   Interval Oxygen?  Yes  Yes    Baseline Oxygen Saturation %  91 %  89 %    1 Minute Oxygen Saturation %  89 %  92 %    1 Minute Liters of Oxygen  0 L  0 L    2 Minute Oxygen Saturation %  88 %  90 %    2 Minute Liters of Oxygen  0 L  0 L    3 Minute Oxygen Saturation %  91 %  86 %    3 Minute Liters of Oxygen  0 L  0 L    4 Minute Oxygen Saturation %  87 %  86 %    4 Minute Liters of Oxygen  0 L  2 L    5 Minute Oxygen Saturation %  88 %  89 %    5 Minute Liters of Oxygen  0 L  2 L    6 Minute Oxygen Saturation %  87 %  90 %    6 Minute Liters of Oxygen  0 L  2 L    2 Minute Post Oxygen Saturation %  92 %  -    2 Minute Post Liters of Oxygen  0 L  -       Oxygen Initial Assessment: Oxygen Initial Assessment - 11/15/18 1631      Home Oxygen   Home Oxygen Device  None      Initial 6 min Walk   Oxygen Used  None      Program Oxygen Prescription   Program Oxygen Prescription  None   patient desaturated to 87% during 6MWT-will re-evaluate during exercise      Oxygen Re-Evaluation: Oxygen Re-Evaluation    Row Name 12/18/18 0743 01/07/19 0936           Program Oxygen Prescription   Program Oxygen Prescription  Continuous;E-Tanks  Continuous;E-Tanks      Liters per minute  2  2      Comments  only while walking  only while walking        Home Oxygen   Home Oxygen Device  None will discuss with Dr. [place] Bells at her next appointment  None      Sleep Oxygen Prescription  None  None      Home Exercise Oxygen Prescription  - 2 liters while walking if Dr. [place] Bells orders oxygen  None      Home at Rest Exercise Oxygen Prescription  None  None      Compliance with Home Oxygen Use  Yes  Yes        Goals/Expected Outcomes   Short Term Goals  To learn and understand importance of maintaining oxygen saturations>88%;To learn and understand importance of monitoring SPO2 with pulse oximeter and demonstrate accurate use of the pulse  oximeter.;To learn and demonstrate proper pursed lip breathing techniques or other breathing techniques.;To learn and demonstrate proper use of respiratory medications  To learn and understand importance of maintaining oxygen saturations>88%;To learn and understand importance of monitoring SPO2 with pulse oximeter and demonstrate accurate use of the pulse oximeter.;To  learn and demonstrate proper pursed lip breathing techniques or other breathing techniques.;To learn and demonstrate proper use of respiratory medications      Long  Term Goals  Verbalizes importance of monitoring SPO2 with pulse oximeter and return demonstration;Maintenance of O2 saturations>88%;Exhibits proper breathing techniques, such as pursed lip breathing or other method taught during program session;Compliance with respiratory medication;Demonstrates proper use of MDI's  Verbalizes importance of monitoring SPO2 with pulse oximeter and return demonstration;Maintenance of O2 saturations>88%;Exhibits proper breathing techniques, such as pursed lip breathing or other method taught during program session;Compliance with respiratory medication;Demonstrates proper use of MDI's      Goals/Expected Outcomes  potential oxygen at home for exercise  potential oxygen at home for exercise         Oxygen Discharge (Final Oxygen Re-Evaluation): Oxygen Re-Evaluation - 01/07/19 0936      Program Oxygen Prescription   Program Oxygen Prescription  Continuous;E-Tanks    Liters per minute  2    Comments  only while walking      Home Oxygen   Home Oxygen Device  None    Sleep Oxygen Prescription  None    Home Exercise Oxygen Prescription  None    Home at Rest Exercise Oxygen Prescription  None    Compliance with Home Oxygen Use  Yes      Goals/Expected Outcomes   Short Term Goals  To learn and understand importance of maintaining oxygen saturations>88%;To learn and understand importance of monitoring SPO2 with pulse oximeter and demonstrate  accurate use of the pulse oximeter.;To learn and demonstrate proper pursed lip breathing techniques or other breathing techniques.;To learn and demonstrate proper use of respiratory medications    Long  Term Goals  Verbalizes importance of monitoring SPO2 with pulse oximeter and return demonstration;Maintenance of O2 saturations>88%;Exhibits proper breathing techniques, such as pursed lip breathing or other method taught during program session;Compliance with respiratory medication;Demonstrates proper use of MDI's    Goals/Expected Outcomes  potential oxygen at home for exercise       Initial Exercise Prescription: Initial Exercise Prescription - 11/15/18 1500      Date of Initial Exercise RX and Referring Provider   Date  11/15/18    Referring Provider  mcquaid      Recumbant Bike   Level  1    Watts  10    Minutes  17      NuStep   Level  2    SPM  80    Minutes  17    METs  1.5      Track   Laps  10    Minutes  17      Prescription Details   Frequency (times per week)  2    Duration  Progress to 45 minutes of aerobic exercise without signs/symptoms of physical distress      Intensity   THRR 40-80% of Max Heartrate  59-118    Ratings of Perceived Exertion  11-15    Perceived Dyspnea  0-4      Resistance Training   Training Prescription  Yes    Weight  orange bands    Reps  10-15       Perform Capillary Blood Glucose checks as needed.  Exercise Prescription Changes: Exercise Prescription Changes    Row Name 12/04/18 1600 12/13/18 1438 12/18/18 1400 12/27/18 1520       Response to Exercise   Blood Pressure (Admit)  114/78  124/64  -  108/66    Blood  Pressure (Exercise)  138/76  154/80  -  126/80    Blood Pressure (Exit)  130/80  110/72  -  114/70    Heart Rate (Admit)  88 bpm  85 bpm  -  91 bpm    Heart Rate (Exercise)  132 bpm  116 bpm  -  118 bpm    Heart Rate (Exit)  84 bpm at end of cool down HR 119. Gave her water and feet elevated  83 bpm  -  98 bpm     Oxygen Saturation (Admit)  93 %  92 %  -  94 %    Oxygen Saturation (Exercise)  89 %  93 %  -  92 %    Oxygen Saturation (Exit)  92 %  93 %  -  97 %    Rating of Perceived Exertion (Exercise)  15  13  -  10    Perceived Dyspnea (Exercise)  2  1  -  2    Duration  Progress to 45 minutes of aerobic exercise without signs/symptoms of physical distress  Progress to 45 minutes of aerobic exercise without signs/symptoms of physical distress  -  Progress to 45 minutes of aerobic exercise without signs/symptoms of physical distress    Intensity  Other (comment)  THRR unchanged  -  THRR unchanged      Progression   Progression  Continue to progress workloads to maintain intensity without signs/symptoms of physical distress. 40-80% of HRR  Continue to progress workloads to maintain intensity without signs/symptoms of physical distress.  -  Continue to progress workloads to maintain intensity without signs/symptoms of physical distress.      Resistance Training   Training Prescription  Yes  Yes  -  Yes    Weight  orange bands  orange bands  -  orange bands    Reps  10-15  10-15  -  10-15    Time  10 Minutes  10 Minutes  -  10 Minutes      Treadmill   MPH  -  2  -  2    Grade  -  3  -  3    Minutes  -  17  -  17      Recumbant Bike   Level  1  -  -  2    Watts  10  -  -  -    Minutes  17  -  -  17      NuStep   Level  2  2  -  -    SPM  80  80  -  -    Minutes  17  17  -  -    METs  2.6  2.9  -  -      Track   Laps  - 6 mintue walk test  -  -  -       Exercise Comments:   Exercise Goals and Review: Exercise Goals    Row Name 11/09/18 1042             Exercise Goals   Increase Physical Activity  Yes       Intervention  Provide advice, education, support and counseling about physical activity/exercise needs.;Develop an individualized exercise prescription for aerobic and resistive training based on initial evaluation findings, risk stratification, comorbidities and participant's  personal goals.       Expected Outcomes  Short Term: Attend rehab on a  regular basis to increase amount of physical activity.;Long Term: Add in home exercise to make exercise part of routine and to increase amount of physical activity.;Long Term: Exercising regularly at least 3-5 days a week.       Increase Strength and Stamina  Yes       Intervention  Provide advice, education, support and counseling about physical activity/exercise needs.;Develop an individualized exercise prescription for aerobic and resistive training based on initial evaluation findings, risk stratification, comorbidities and participant's personal goals.       Expected Outcomes  Short Term: Increase workloads from initial exercise prescription for resistance, speed, and METs.;Short Term: Perform resistance training exercises routinely during rehab and add in resistance training at home;Long Term: Improve cardiorespiratory fitness, muscular endurance and strength as measured by increased METs and functional capacity (6MWT)       Able to understand and use rate of perceived exertion (RPE) scale  Yes       Intervention  Provide education and explanation on how to use RPE scale       Expected Outcomes  Short Term: Able to use RPE daily in rehab to express subjective intensity level;Long Term:  Able to use RPE to guide intensity level when exercising independently       Able to understand and use Dyspnea scale  Yes       Intervention  Provide education and explanation on how to use Dyspnea scale       Expected Outcomes  Short Term: Able to use Dyspnea scale daily in rehab to express subjective sense of shortness of breath during exertion;Long Term: Able to use Dyspnea scale to guide intensity level when exercising independently       Knowledge and understanding of Target Heart Rate Range (THRR)  Yes       Intervention  Provide education and explanation of THRR including how the numbers were predicted and where they are located for  reference       Expected Outcomes  Short Term: Able to state/look up THRR;Long Term: Able to use THRR to govern intensity when exercising independently;Short Term: Able to use daily as guideline for intensity in rehab       Understanding of Exercise Prescription  Yes       Intervention  Provide education, explanation, and written materials on patient's individual exercise prescription       Expected Outcomes  Short Term: Able to explain program exercise prescription;Long Term: Able to explain home exercise prescription to exercise independently          Exercise Goals Re-Evaluation : Exercise Goals Re-Evaluation    Row Name 12/18/18 0742 01/07/19 0937           Exercise Goal Re-Evaluation   Exercise Goals Review  Increase Physical Activity;Increase Strength and Stamina;Able to understand and use rate of perceived exertion (RPE) scale;Able to understand and use Dyspnea scale;Knowledge and understanding of Target Heart Rate Range (THRR);Understanding of Exercise Prescription  Increase Physical Activity;Increase Strength and Stamina;Able to understand and use rate of perceived exertion (RPE) scale;Able to understand and use Dyspnea scale;Knowledge and understanding of Target Heart Rate Range (THRR);Understanding of Exercise Prescription      Comments  Patient is progressing well in rehab. She shows motivation and determination. She has progressed from walking track to the treadmill. Patients MET average is low-moderate. We will discuss importance of home exercise. Will cont to monitor and motivate.  Pt was progressing well in the program. She was exercising at ~3.0 METs. She  was very motivated and eager for workload increases. Our department is currently closed and will be for an unknown amount of time. I called pt on 3/16 to instruct pt on home exercise to be doing during our layoff. Will call to follow up with pt and give updates later in the week after I recieve them.      Expected Outcomes   Through exercise at rehab and at home, the patient will decrease shortness of breath with daily activities and feel confident in carrying out an exercise regime at home.   Through exercise at rehab and at home, the patient will decrease shortness of breath with daily activities and feel confident in carrying out an exercise regime at home.          Discharge Exercise Prescription (Final Exercise Prescription Changes): Exercise Prescription Changes - 12/27/18 1520      Response to Exercise   Blood Pressure (Admit)  108/66    Blood Pressure (Exercise)  126/80    Blood Pressure (Exit)  114/70    Heart Rate (Admit)  91 bpm    Heart Rate (Exercise)  118 bpm    Heart Rate (Exit)  98 bpm    Oxygen Saturation (Admit)  94 %    Oxygen Saturation (Exercise)  92 %    Oxygen Saturation (Exit)  97 %    Rating of Perceived Exertion (Exercise)  10    Perceived Dyspnea (Exercise)  2    Duration  Progress to 45 minutes of aerobic exercise without signs/symptoms of physical distress    Intensity  THRR unchanged      Progression   Progression  Continue to progress workloads to maintain intensity without signs/symptoms of physical distress.      Resistance Training   Training Prescription  Yes    Weight  orange bands    Reps  10-15    Time  10 Minutes      Treadmill   MPH  2    Grade  3    Minutes  17      Recumbant Bike   Level  2    Minutes  17       Nutrition:  Target Goals: Understanding of nutrition guidelines, daily intake of sodium <1587m, cholesterol <2060m calories 30% from fat and 7% or less from saturated fats, daily to have 5 or more servings of fruits and vegetables.  Biometrics: Pre Biometrics - 11/09/18 1047      Pre Biometrics   Grip Strength  21 kg        Nutrition Therapy Plan and Nutrition Goals: Nutrition Therapy & Goals - 11/30/18 0946      Nutrition Therapy   Diet  carb modified       Personal Nutrition Goals   Nutrition Goal  Identify food quantities  necessary to achieve wt loss of  -2# per week to a goal wt loss of 2.7-10.9 kg (6-24 lb) at graduation from pulmonary rehab.    Personal Goal #2  Pt to weigh and measure portions for accuracy    Personal Goal #3  Pt to build a healthy plate including fruits, vegetables, whole grains, and low-fat dairy products in a healthy meal plan.      Intervention Plan   Intervention  Prescribe, educate and counsel regarding individualized specific dietary modifications aiming towards targeted core components such as weight, hypertension, lipid management, diabetes, heart failure and other comorbidities.    Expected Outcomes  Short Term Goal: Understand basic principles  of dietary content, such as calories, fat, sodium, cholesterol and nutrients.;Long Term Goal: Adherence to prescribed nutrition plan.       Nutrition Assessments:   Nutrition Goals Re-Evaluation:   Nutrition Goals Discharge (Final Nutrition Goals Re-Evaluation):   Psychosocial: Target Goals: Acknowledge presence or absence of significant depression and/or stress, maximize coping skills, provide positive support system. Participant is able to verbalize types and ability to use techniques and skills needed for reducing stress and depression.  Initial Review & Psychosocial Screening: Initial Psych Review & Screening - 11/09/18 1205      Initial Review   Comments  pt husband passed in 09/2017 and pt states it has been an adjustment and stressful dealing with health issues by herself as well and rebudgeting for a one income house.      Family Dynamics   Comments  pt husband passed in 09/2017. pt now lives alone with dog who also has medical issues.       Screening Interventions   Interventions  To provide support and resources with identified psychosocial needs       Quality of Life Scores:  Scores of 19 and below usually indicate a poorer quality of life in these areas.  A difference of  2-3 points is a clinically meaningful  difference.  A difference of 2-3 points in the total score of the Quality of Life Index has been associated with significant improvement in overall quality of life, self-image, physical symptoms, and general health in studies assessing change in quality of life.  PHQ-9: Recent Review Flowsheet Data    Depression screen [facility] 2/9 11/09/2018   Decreased Interest 0   Down, Depressed, Hopeless 0   PHQ - 2 Score 0   Altered sleeping 0   Tired, decreased energy 1   Change in appetite 3   Feeling bad or failure about yourself  0   Trouble concentrating 2   Moving slowly or fidgety/restless 0   Suicidal thoughts 0   PHQ-9 Score 6   Difficult doing work/chores Somewhat difficult     Interpretation of Total Score  Total Score Depression Severity:  1-4 = Minimal depression, 5-9 = Mild depression, 10-14 = Moderate depression, 15-19 = Moderately severe depression, 20-27 = Severe depression   Psychosocial Evaluation and Intervention: Psychosocial Evaluation - 11/09/18 1212      Psychosocial Evaluation & Interventions   Comments  Pt recently lost her husband and has been adjusting to life and income without him. pt also has a dog that is older in age and has some health problems but she states she is cherishing her time with him. Pt does have a sister who she maintains close contact with daily and who checks on her often. Pt likes to read, but states that doesnt as much as before her husband passed, but she watched movies and tv every evening with her dog which she enjoys.    Expected Outcomes  pt will become more involved in things that she used to participate in before her husband got sick. States she wants to read more, especially a book that was given to her by her neice.    Continue Psychosocial Services   Follow up required by staff       Psychosocial Re-Evaluation: Psychosocial Re-Evaluation    [place] Name 11/20/18 1704 12/17/18 1332 01/07/19 1100         Psychosocial Re-Evaluation    Current issues with  Current Stress Concerns  Current Stress Concerns  Current Stress Concerns  Comments  Has recently lost her husband which has been an adjustment, she has a dog who is a great comfort and has friends and family  Dealing with the loss of her husband, is doing well, smiles and seems happy.  Presently no barriers to participation in pulmonary rehab.  Pt continues to deal with the loss of her husband, but seems to be coping well. Pt is socialable when in rehab and seems to be in good spirits. Rehab is currently closed and will plan to re-evaulate pt progress upon reopening.     Expected Outcomes  To deal with grief and stress in a healthy manner  Continues to have no psychosocial barriers to participation in pulmonary rehab  Continues to have no psychosocial barriers to participation in pulmonary rehab     Interventions  Encouraged to attend Pulmonary Rehabilitation for the exercise;Relaxation education;Stress management education  Encouraged to attend Pulmonary Rehabilitation for the exercise;Relaxation education;Stress management education  -     Continue Psychosocial Services   Follow up required by staff  Follow up required by staff  Follow up required by staff     Comments  Is just beginning program, has not exercised yet, will continue to monitor psychsocial for barriers  No psychosocial barriers are present at this time.  No psychosocial barriers are present at this time.       Initial Review   Source of Stress Concerns  Chronic Illness;Financial  Chronic Illness;Financial  -        Psychosocial Discharge (Final Psychosocial Re-Evaluation): Psychosocial Re-Evaluation - 01/07/19 1100      Psychosocial Re-Evaluation   Current issues with  Current Stress Concerns    Comments  Pt continues to deal with the loss of her husband, but seems to be coping well. Pt is socialable when in rehab and seems to be in good spirits. Rehab is currently closed and will plan to re-evaulate pt  progress upon reopening.    Expected Outcomes  Continues to have no psychosocial barriers to participation in pulmonary rehab    Continue Psychosocial Services   Follow up required by staff    Comments  No psychosocial barriers are present at this time.       Education: Education Goals: Education classes will be provided on a weekly basis, covering required topics. Participant will state understanding/return demonstration of topics presented.  Learning Barriers/Preferences: Learning Barriers/Preferences - 11/09/18 1104      Learning Barriers/Preferences   Learning Barriers  Sight    Learning Preferences  Audio;Computer/Internet;Group Instruction;Individual Instruction;Pictoral;Skilled Demonstration;Verbal Instruction;Video;Written Material       Education Topics: Risk Factor Reduction:  -Group instruction that is supported by a PowerPoint presentation. Instructor discusses the definition of a risk factor, different risk factors for pulmonary disease, and how the heart and lungs work together.     Nutrition for Pulmonary Patient:  -Group instruction provided by PowerPoint slides, verbal discussion, and written materials to support subject matter. The instructor gives an explanation and review of healthy diet recommendations, which includes a discussion on weight management, recommendations for fruit and vegetable consumption, as well as protein, fluid, caffeine, fiber, sodium, sugar, and alcohol. Tips for eating when patients are short of breath are discussed.   Pursed Lip Breathing:  -Group instruction that is supported by demonstration and informational handouts. Instructor discusses the benefits of pursed lip and diaphragmatic breathing and detailed demonstration on how to preform both.     Oxygen Safety:  -Group instruction provided by PowerPoint, verbal discussion, and  written material to support subject matter. There is an overview of "What is Oxygen" and "Why do we need it".   Instructor also reviews how to create a safe environment for oxygen use, the importance of using oxygen as prescribed, and the risks of noncompliance. There is a brief discussion on traveling with oxygen and resources the patient may utilize.   PULMONARY REHAB OTHER RESPIRATORY from 12/27/2018 in [place]  Date  11/22/18  Educator  Cloyde Reams  Instruction Review Code  1- Verbalizes Understanding      Oxygen Equipment:  -Group instruction provided by [employer] utilizing handouts, written materials, and Insurance underwriter.   PULMONARY REHAB OTHER RESPIRATORY from 12/27/2018 in [place]  Date  11/29/18  Educator  Ace Gins Rep  Instruction Review Code  1- Verbalizes Understanding      Signs and Symptoms:  -Group instruction provided by written material and verbal discussion to support subject matter. Warning signs and symptoms of infection, stroke, and heart attack are reviewed and when to call the physician/911 reinforced. Tips for preventing the spread of infection discussed.   PULMONARY REHAB OTHER RESPIRATORY from 12/27/2018 in [facility]  Date  12/13/18  Educator  Remo Lipps  Instruction Review Code  1- Verbalizes Understanding      Advanced Directives:  -Group instruction provided by verbal instruction and written material to support subject matter. Instructor reviews Advanced Directive laws and proper instruction for filling out document.   Pulmonary Video:  -Group video education that reviews the importance of medication and oxygen compliance, exercise, good nutrition, pulmonary hygiene, and pursed lip and diaphragmatic breathing for the pulmonary patient.   Exercise for the Pulmonary Patient:  -Group instruction that is supported by a PowerPoint presentation. Instructor discusses benefits of exercise, core components of exercise, frequency, duration, and intensity of an exercise  routine, importance of utilizing pulse oximetry during exercise, safety while exercising, and options of places to exercise outside of rehab.     PULMONARY REHAB OTHER RESPIRATORY from 12/27/2018 in [place]  Date  12/06/18  Educator  molly  Instruction Review Code  1- Verbalizes Understanding      Pulmonary Medications:  -Verbally interactive group education provided by instructor with focus on inhaled medications and proper administration.   Anatomy and Physiology of the Respiratory System and Intimacy:  -Group instruction provided by PowerPoint, verbal discussion, and written material to support subject matter. Instructor reviews respiratory cycle and anatomical components of the respiratory system and their functions. Instructor also reviews differences in obstructive and restrictive respiratory diseases with examples of each. Intimacy, Sex, and Sexuality differences are reviewed with a discussion on how relationships can change when diagnosed with pulmonary disease. Common sexual concerns are reviewed.   PULMONARY REHAB OTHER RESPIRATORY from 12/27/2018 in [place]  Date  12/27/18  Educator  RN  Instruction Review Code  1- Verbalizes Understanding      MD DAY -A group question and answer session with a medical doctor that allows participants to ask questions that relate to their pulmonary disease state.   OTHER EDUCATION -Group or individual verbal, written, or video instructions that support the educational goals of the pulmonary rehab program.   Holiday Eating Survival Tips:  -Group instruction provided by PowerPoint slides, verbal discussion, and written materials to support subject matter. The instructor gives patients tips, tricks, and techniques to help them not only survive but  enjoy the holidays despite the onslaught of food that accompanies the holidays.   Knowledge Questionnaire Score: Knowledge  Questionnaire Score - 11/09/18 1059      Knowledge Questionnaire Score   Pre Score  18/18       Core Components/Risk Factors/Patient Goals at Admission: Personal Goals and Risk Factors at Admission - 11/09/18 1105      Core Components/Risk Factors/Patient Goals on Admission    Weight Management  Weight Gain;Yes    Intervention  Weight Management: Develop a combined nutrition and exercise program designed to reach desired caloric intake, while maintaining appropriate intake of nutrient and fiber, sodium and fats, and appropriate energy expenditure required for the weight goal.    Admit Weight  158 lb 11.7 oz (72 kg)    Goal Weight: Short Term  148 lb (67.1 kg)    Expected Outcomes  Short Term: Continue to assess and modify interventions until short term weight is achieved;Long Term: Adherence to nutrition and physical activity/exercise program aimed toward attainment of established weight goal;Weight Loss: Understanding of general recommendations for a balanced deficit meal plan, which promotes 1-2 lb weight loss per week and includes a negative energy balance of [id number] kcal/d;Understanding of distribution of calorie intake throughout the day with the consumption of 4-5 meals/snacks;Understanding recommendations for meals to include 15-35% energy as protein, 25-35% energy from fat, 35-60% energy from carbohydrates, less than 220m of dietary cholesterol, 20-35 gm of total fiber daily    Improve shortness of breath with ADL's  Yes    Intervention  Provide education, individualized exercise plan and daily activity instruction to help decrease symptoms of SOB with activities of daily living.    Expected Outcomes  Short Term: Improve cardiorespiratory fitness to achieve a reduction of symptoms when performing ADLs;Long Term: Be able to perform more ADLs without symptoms or delay the onset of symptoms    Hypertension  Yes    Intervention  Provide education on lifestyle modifcations including regular  physical activity/exercise, weight management, moderate sodium restriction and increased consumption of fresh fruit, vegetables, and low fat dairy, alcohol moderation, and smoking cessation.;Monitor prescription use compliance.    Expected Outcomes  Short Term: Continued assessment and intervention until BP is < 140/928mHG in hypertensive participants. < 130/808mG in hypertensive participants with diabetes, heart failure or chronic kidney disease.;Long Term: Maintenance of blood pressure at goal levels.    Lipids  Yes    Intervention  Provide education and support for participant on nutrition & aerobic/resistive exercise along with prescribed medications to achieve LDL <44m15mDL >40mg8m Expected Outcomes  Short Term: Participant states understanding of desired cholesterol values and is compliant with medications prescribed. Participant is following exercise prescription and nutrition guidelines.;Long Term: Cholesterol controlled with medications as prescribed, with individualized exercise RX and with personalized nutrition plan. Value goals: LDL < 44mg,10m > 40 mg.    Stress  Yes    Intervention  Offer individual and/or small group education and counseling on adjustment to heart disease, stress management and health-related lifestyle change. Teach and support self-help strategies.;Refer participants experiencing significant psychosocial distress to appropriate mental health specialists for further evaluation and treatment. When possible, include family members and significant others in education/counseling sessions.    Expected Outcomes  Short Term: Participant demonstrates changes in health-related behavior, relaxation and other stress management skills, ability to obtain effective social support, and compliance with psychotropic medications if prescribed.;Long Term: Emotional wellbeing is indicated by absence of clinically significant psychosocial distress  or social isolation.       Core  Components/Risk Factors/Patient Goals Review:  Goals and Risk Factor Review    Row Name 11/20/18 1708 12/17/18 1336 01/07/19 1103         Core Components/Risk Factors/Patient Goals Review   Personal Goals Review  Weight Management/Obesity;Improve shortness of breath with ADL's;Increase knowledge of respiratory medications and ability to use respiratory devices properly.;Stress;Develop more efficient breathing techniques such as purse lipped breathing and diaphragmatic breathing and practicing self-pacing with activity.  Weight Management/Obesity;Improve shortness of breath with ADL's;Increase knowledge of respiratory medications and ability to use respiratory devices properly.;Develop more efficient breathing techniques such as purse lipped breathing and diaphragmatic breathing and practicing self-pacing with activity.  Weight Management/Obesity;Improve shortness of breath with ADL's;Increase knowledge of respiratory medications and ability to use respiratory devices properly.;Develop more efficient breathing techniques such as purse lipped breathing and diaphragmatic breathing and practicing self-pacing with activity.;Diabetes     Review  Has not started the exercise sessions as of yet, but will begin 11/22/2018  Has attended 7 exercise sessions, is progressing well with workload increases on the nustep, treadmill, and recumbent bike, works hard  Has attended 10 exercise sessions and 6 education sessions. Pt workloads are a level 2 on the recumbent bike, level 2.0/3.0 on the treadmill and level 2 on the nustep. Pt blood sugars remain at an acceptable level for exercise and pt reports decreased RPE and RPD scores since start. Rehab is currently closed and will plan to re-evaulate pt progress upon reopening.     Expected Outcomes  See admission goals  See admission goals  See admission goals and outcomes        Core Components/Risk Factors/Patient Goals at Discharge (Final Review):  Goals and Risk Factor  Review - 01/07/19 1103      Core Components/Risk Factors/Patient Goals Review   Personal Goals Review  Weight Management/Obesity;Improve shortness of breath with ADL's;Increase knowledge of respiratory medications and ability to use respiratory devices properly.;Develop more efficient breathing techniques such as purse lipped breathing and diaphragmatic breathing and practicing self-pacing with activity.;Diabetes    Review  Has attended 10 exercise sessions and 6 education sessions. Pt workloads are a level 2 on the recumbent bike, level 2.0/3.0 on the treadmill and level 2 on the nustep. Pt blood sugars remain at an acceptable level for exercise and pt reports decreased RPE and RPD scores since start. Rehab is currently closed and will plan to re-evaulate pt progress upon reopening.    Expected Outcomes  See admission goals and outcomes       ITP Comments: ITP Comments    Row Name 11/09/18 2820 01/07/19 1100         ITP Comments  Dr. Jennet Maduro, Medical Director, Pulmonary Rehab  Dr. Jennet Maduro, Medical Director, Pulmonary Rehab         Comments: ITP REVIEW Pt is making expected progress toward personal goals after completing 10 sessions. Rehab is currently closed and will plan to re-evaulate pt progress upon reopening.  Joycelyn Man RN, BSN Cardiac and Pulmonary Rehab RN

## 2019-01-08 ENCOUNTER — Telehealth (HOSPITAL_COMMUNITY): Payer: Self-pay

## 2019-01-08 ENCOUNTER — Encounter (HOSPITAL_COMMUNITY): Payer: Medicare Other

## 2019-01-10 ENCOUNTER — Encounter (HOSPITAL_COMMUNITY): Payer: Medicare Other

## 2019-01-15 ENCOUNTER — Encounter (HOSPITAL_COMMUNITY): Payer: Medicare Other

## 2019-01-17 ENCOUNTER — Encounter (HOSPITAL_COMMUNITY): Payer: Medicare Other

## 2019-01-22 ENCOUNTER — Encounter (HOSPITAL_COMMUNITY): Payer: Medicare Other

## 2019-01-22 DIAGNOSIS — J449 Chronic obstructive pulmonary disease, unspecified: Secondary | ICD-10-CM | POA: Diagnosis not present

## 2019-01-22 DIAGNOSIS — I10 Essential (primary) hypertension: Secondary | ICD-10-CM | POA: Diagnosis not present

## 2019-01-22 DIAGNOSIS — E785 Hyperlipidemia, unspecified: Secondary | ICD-10-CM | POA: Diagnosis not present

## 2019-01-22 DIAGNOSIS — Z0000 Encounter for general adult medical examination without abnormal findings: Secondary | ICD-10-CM | POA: Diagnosis not present

## 2019-01-22 DIAGNOSIS — Z1389 Encounter for screening for other disorder: Secondary | ICD-10-CM | POA: Diagnosis not present

## 2019-01-22 DIAGNOSIS — E1169 Type 2 diabetes mellitus with other specified complication: Secondary | ICD-10-CM | POA: Diagnosis not present

## 2019-01-22 DIAGNOSIS — L309 Dermatitis, unspecified: Secondary | ICD-10-CM | POA: Diagnosis not present

## 2019-01-24 ENCOUNTER — Encounter (HOSPITAL_COMMUNITY): Payer: Medicare Other

## 2019-01-29 ENCOUNTER — Telehealth (HOSPITAL_COMMUNITY): Payer: Self-pay | Admitting: *Deleted

## 2019-01-29 ENCOUNTER — Encounter (HOSPITAL_COMMUNITY): Payer: Medicare Other

## 2019-01-31 ENCOUNTER — Encounter (HOSPITAL_COMMUNITY): Payer: Medicare Other

## 2019-02-04 ENCOUNTER — Telehealth: Payer: Self-pay | Admitting: Pulmonary Disease

## 2019-02-04 DIAGNOSIS — J410 Simple chronic bronchitis: Secondary | ICD-10-CM

## 2019-02-04 DIAGNOSIS — J432 Centrilobular emphysema: Secondary | ICD-10-CM

## 2019-02-04 MED ORDER — ALBUTEROL SULFATE (2.5 MG/3ML) 0.083% IN NEBU: INHALATION_SOLUTION | RESPIRATORY_TRACT | 3 refills | Status: DC

## 2019-02-04 MED ORDER — ALBUTEROL SULFATE (2.5 MG/3ML) 0.083% IN NEBU: 2.5000 mg | INHALATION_SOLUTION | Freq: Four times a day (QID) | RESPIRATORY_TRACT | 3 refills | Status: DC | PRN

## 2019-02-04 NOTE — Telephone Encounter (Signed)
Refill of albuterol neb sol has been sent to pharmacy of choice by pt. Called and spoke with pt letting her know this had been done. Pt expressed understanding. Nothing further needed.

## 2019-02-04 NOTE — Telephone Encounter (Signed)
Returned call to Helene Kelp at [facility]. Directions on Albuterol must not state prn. Directions changed to every 6 hours as directed for sob/wheezing. 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 Nothing further needed.

## 2019-02-04 NOTE — Telephone Encounter (Signed)
Pt would like to have the med sent to [facility] - phone : [phone] FAX: [phone]

## 2019-02-05 ENCOUNTER — Encounter (HOSPITAL_COMMUNITY): Payer: Medicare Other

## 2019-02-07 ENCOUNTER — Encounter (HOSPITAL_COMMUNITY): Payer: Medicare Other

## 2019-02-12 ENCOUNTER — Encounter (HOSPITAL_COMMUNITY): Payer: Medicare Other

## 2019-02-12 ENCOUNTER — Ambulatory Visit: Payer: Medicare Other | Admitting: Pulmonary Disease

## 2019-02-14 ENCOUNTER — Encounter (HOSPITAL_COMMUNITY): Payer: Medicare Other

## 2019-02-19 ENCOUNTER — Encounter (HOSPITAL_COMMUNITY): Payer: Medicare Other

## 2019-02-21 ENCOUNTER — Encounter (HOSPITAL_COMMUNITY): Payer: Medicare Other

## 2019-02-26 ENCOUNTER — Encounter (HOSPITAL_COMMUNITY): Payer: Medicare Other

## 2019-02-27 ENCOUNTER — Telehealth (HOSPITAL_COMMUNITY): Payer: Self-pay | Admitting: *Deleted

## 2019-03-13 DIAGNOSIS — E785 Hyperlipidemia, unspecified: Secondary | ICD-10-CM | POA: Diagnosis not present

## 2019-03-13 DIAGNOSIS — E1169 Type 2 diabetes mellitus with other specified complication: Secondary | ICD-10-CM | POA: Diagnosis not present

## 2019-03-13 DIAGNOSIS — I10 Essential (primary) hypertension: Secondary | ICD-10-CM | POA: Diagnosis not present

## 2019-05-03 ENCOUNTER — Telehealth (HOSPITAL_COMMUNITY): Payer: Self-pay

## 2019-05-06 ENCOUNTER — Telehealth (HOSPITAL_COMMUNITY): Payer: Self-pay | Admitting: Family Medicine

## 2019-05-07 DIAGNOSIS — Z5181 Encounter for therapeutic drug level monitoring: Secondary | ICD-10-CM | POA: Diagnosis not present

## 2019-05-07 DIAGNOSIS — M810 Age-related osteoporosis without current pathological fracture: Secondary | ICD-10-CM | POA: Diagnosis not present

## 2019-05-07 DIAGNOSIS — Z8781 Personal history of (healed) traumatic fracture: Secondary | ICD-10-CM | POA: Diagnosis not present

## 2019-06-17 ENCOUNTER — Other Ambulatory Visit: Payer: Self-pay

## 2019-06-17 ENCOUNTER — Telehealth: Payer: Self-pay | Admitting: Pulmonary Disease

## 2019-06-17 ENCOUNTER — Ambulatory Visit (INDEPENDENT_AMBULATORY_CARE_PROVIDER_SITE_OTHER): Payer: Medicare Other | Admitting: Pulmonary Disease

## 2019-06-17 ENCOUNTER — Encounter: Payer: Self-pay | Admitting: Pulmonary Disease

## 2019-06-17 VITALS — BP 112/74 | HR 84 | Temp 97.4°F | Ht 62.0 in | Wt 161.0 lb

## 2019-06-17 DIAGNOSIS — J9611 Chronic respiratory failure with hypoxia: Secondary | ICD-10-CM

## 2019-06-17 DIAGNOSIS — Z23 Encounter for immunization: Secondary | ICD-10-CM | POA: Diagnosis not present

## 2019-06-17 DIAGNOSIS — J410 Simple chronic bronchitis: Secondary | ICD-10-CM

## 2019-06-17 NOTE — Progress Notes (Signed)
Reviewed, agree 

## 2019-06-17 NOTE — Patient Instructions (Addendum)
You were seen today by Lauraine Rinne, NP  for:   1. COPD  Anoro Ellipta  >>> Take 1 puff daily in the morning right when you wake up >>>Rinse your mouth out after use >>>This is a daily maintenance inhaler, NOT a rescue inhaler >>>Contact our office if you are having difficulties affording or obtaining this medication >>>It is important for you to be able to take this daily and not miss any doses    Note your daily symptoms > remember "red flags" for COPD:   >>>Increase in cough >>>increase in sputum production >>>increase in shortness of breath or activity  intolerance.   If you notice these symptoms, please call the office to be seen.     High risk flu vaccine today    - Pulmonary function test; Future  2. Chronic respiratory failure with hypoxia (HCC)  Walk today in office for POC  Continue oxygen therapy as prescribed - 2L with exertion  >>>maintain oxygen saturations greater than 88 percent  >>>if unable to maintain oxygen saturations please contact the office  >>>do not smoke with oxygen  >>>can use nasal saline gel or nasal saline rinses to moisturize nose if oxygen causes dryness  Overnight oximetry test ordered  We recommend today:  Orders Placed This Encounter  Procedures   Pulse oximetry, overnight    Standing Status:   Future    Standing Expiration Date:   06/16/2020    Scheduling Instructions:     On RA   Pulmonary function test    Standing Status:   Future    Standing Expiration Date:   06/16/2020    Order Specific Question:   Where should this test be performed?    Answer:   [facility]    Order Specific Question:   Full PFT: includes the following: basic spirometry, spirometry pre & post bronchodilator, diffusion capacity (DLCO), lung volumes    Answer:   Full PFT   Orders Placed This Encounter  Procedures   Pulse oximetry, overnight   Pulmonary function test   No orders of the defined types were placed in this encounter.   Follow  Up:    Return in about 3 months (around 09/16/2019), or if symptoms worsen or fail to improve, for Follow up with Dr. Carlis Abbott - 29min appt to establish care, Follow up for PFT.   Please do your part to reduce the spread of COVID-19:      Reduce your risk of any infection  and COVID19 by using the similar precautions used for avoiding the common cold or flu:   Wash your hands often with soap and warm water for at least 20 seconds.  If soap and water are not readily available, use an alcohol-based hand sanitizer with at least 60% alcohol.   If coughing or sneezing, cover your mouth and nose by coughing or sneezing into the elbow areas of your shirt or coat, into a tissue or into your sleeve (not your hands).  WEAR A MASK when in public   Avoid shaking hands with others and consider head nods or verbal greetings only.  Avoid touching your eyes, nose, or mouth with unwashed hands.   Avoid close contact with people who are sick.  Avoid places or events with large numbers of people in one location, like concerts or sporting events.  If you have some symptoms but not all symptoms, continue to monitor at home and seek medical attention if your symptoms worsen.  If you are having  a medical emergency, call 911.   [place] / e-Visit: [email or web address]         [facility]: [facility]: [id number]                   [facility]: [id number]     It is flu season:   >>> Best ways to protect herself from the flu: Receive the yearly flu vaccine, practice good hand hygiene washing with soap and also using hand sanitizer when available, eat a nutritious meals, get adequate rest, hydrate appropriately   Please contact the office if your symptoms worsen or you have concerns that you are not improving.   Thank you for choosing [facility] for your healthcare, and for allowing [place] to partner with you on your healthcare journey. I am thankful to be able to provide care to you today.   Wyn Quaker FNP-C     Home Oxygen Use, Adult When a medical condition keeps you from getting enough oxygen, your health care provider may instruct you to take extra oxygen at home. Your health care provider will let you know:  When to take oxygen.  For how long to take oxygen.  How quickly oxygen should be delivered (flow rate), in liters per minute (LPM or L/M). Home oxygen can be given through:  A mask.  A nasal cannula. This is a device or tube that goes in the nostrils.  A transtracheal catheter. This is a small, flexible tube placed in the trachea.  A tracheostomy. This is a surgically made opening in the trachea. These devices are connected with tubing to an oxygen source, such as:  A tank. Tanks hold oxygen in gas form. They must be replaced when the oxygen is used up.  A liquid oxygen device. This holds oxygen in liquid form. It must be replaced when the oxygen is used up.  An oxygen concentrator machine. This filters oxygen in the room. It uses electricity, so you must have a backup cylinder of oxygen in case the power goes out. Supplies needed: To use oxygen, you will need:  A mask, nasal cannula, transtracheal catheter, or tracheostomy.  An oxygen tank, a liquid oxygen device, or an oxygen concentrator.  The tape that your health care provider recommends (optional). If you use a transtracheal catheter and your prescribed flow rate is 1 LPM or greater, you will also need a humidifier. Risks and complications  Fire. This can happen if the oxygen is exposed to a heat source, flame, or spark.  Injury to skin. This can happen if liquid oxygen touches your skin.  Organ damage. This can happen if you get too little oxygen. How to use oxygen Your health care provider or a representative from your [place] will show you how to use your oxygen device. Follow her or his instructions. The instructions may look something like this: 1. Wash your hands. 2. If you use an oxygen concentrator, make sure it is plugged in. 3. Place one end of the tube into the port on the tank, device, or machine. 4. Place the mask over your nose and mouth. Or, place the nasal cannula and secure it with tape if instructed. If you use a tracheostomy or transtracheal catheter, connect it to the oxygen source as directed. 5. Make sure the liter-flow setting on the machine is at the level prescribed by your health care provider.  6. Turn on the machine or adjust the knob on the tank or device to the correct liter-flow setting. 7. When you are done, turn off and unplug the machine, or turn the knob to OFF. How to clean and care for the oxygen supplies Nasal cannula  Clean it with a warm, wet cloth daily or as needed.  Wash it with a liquid soap once a week.  Rinse it thoroughly once or twice a week.  Replace it every 2-4 weeks.  If you have an infection, such as a cold or pneumonia, change the cannula when you get better. Mask  Replace it every 2-4 weeks.  If you have an infection, such as a cold or pneumonia, change the mask when you get better. Humidifier bottle  Wash the bottle between each refill: ? Wash it with soap and warm water. ? Rinse it thoroughly. ? Disinfect it and its top. ? Air-dry it.  Make sure it is dry before you refill it. Oxygen concentrator  Clean the air filter at least twice a week according to directions from your home medical equipment and service company.  Wipe down the cabinet every day. To do this: ? Unplug the unit. ? Wipe down the cabinet with a damp cloth. ? Dry the cabinet. Other equipment  Change any extra tubing every 1-3 months.  Follow instructions from your health care provider about taking care of any other equipment. Safety tips Fire safety  tips   Keep your oxygen and oxygen supplies at least 5 ft away from sources of heat, flames, and sparks at all times.  Do not allow smoking near your oxygen. Put up "no smoking" signs in your home. Avoid smoking areas when in public.  Do not use materials that can burn (are flammable) while you use oxygen.  When you go to a restaurant with portable oxygen, ask to be seated in the nonsmoking section.  Keep a Data processing manager close by. Let your fire department know that you have oxygen in your home.  Test your home smoke detectors regularly. Traveling  Secure your oxygen tank in the vehicle so that it does not move around. Follow instructions from your medical device company about how to safely secure your tank.  Make sure you have enough oxygen for the amount of time you will be away from home.  If you are planning air travel, contact the airline to find out if they allow the use of an approved portable oxygen concentrator. You may also need documents from your health care provider and medical device company before you travel. General safety tips  If you use an oxygen cylinder, make sure it is in a stand or secured to an object that will not move (fixed object).  If you use liquid oxygen, make sure its container is kept upright.  If you use an oxygen concentrator: ? [employer] movement psychotherapist company. Make sure you are given priority service in the event that your power goes out. ? Avoid using extension cords, if possible. Follow these instructions at home:  Use oxygen only as told by your health care provider.  Do not use alcohol or other drugs that make you relax (sedating drugs) unless instructed. They can slow down your breathing rate and make it hard to get in enough oxygen.  Know how and when to order a refill of oxygen.  Always keep a spare tank of oxygen. Plan ahead for holidays when you may not be able to get a prescription filled.  Use water-based lubricants on your lips or  nostrils. Do not use oil-based products like petroleum jelly.  To prevent skin irritation on your cheeks or behind your ears, tuck some gauze under the tubing. Contact a health care provider if:  You get headaches often.  You have shortness of breath.  You have a lasting cough.  You have anxiety.  You are sleepy all the time.  You develop an illness that affects your breathing.  You cannot exercise at your regular level.  You are restless.  You have difficult or irregular breathing, and it is getting worse.  You have a fever.  You have persistent redness under your nose. Get help right away if:  You are confused.  You have blue lips or fingernails.  You are struggling to breathe. Summary  Your health care provider or a representative from your [employer] will show you how to use your oxygen device. Follow her or his instructions.  If you use an oxygen concentrator, make sure it is plugged in.  Make sure the liter-flow setting on the machine is at the level prescribed by your health care provider.  Keep your oxygen and oxygen supplies at least 5 ft away from sources of heat, flames, and sparks at all times. This information is not intended to replace advice given to you by your health care provider. Make sure you discuss any questions you have with your health care provider. Document Released: 12/24/2003 Document Revised: 03/22/2018 Document Reviewed: 04/26/2016 Elsevier Patient Education  [place].   Influenza Virus Vaccine injection What is this medicine? INFLUENZA VIRUS VACCINE (in floo EN zuh VAHY ruhs vak SEEN) helps to reduce the risk of getting influenza also known as the flu. The vaccine only helps protect you against some strains of the flu. This medicine may be used for other purposes; ask your health care provider or pharmacist if you have questions. COMMON BRAND NAME(S): Afluria, Afluria Quadrivalent, Agriflu, Alfuria, FLUAD,  Fluarix, Fluarix Quadrivalent, Flublok, Flublok Quadrivalent, FLUCELVAX, Flulaval, Fluvirin, Fluzone, Fluzone High-Dose, Fluzone Intradermal What should I tell my health care provider before I take this medicine? They need to know if you have any of these conditions:  bleeding disorder like hemophilia  fever or infection  Guillain-Barre syndrome or other neurological problems  immune system problems  infection with the human immunodeficiency virus (HIV) or AIDS  low blood platelet counts  multiple sclerosis  an unusual or allergic reaction to influenza virus vaccine, latex, other medicines, foods, dyes, or preservatives. Different brands of vaccines contain different allergens. Some may contain latex or eggs. Talk to your doctor about your allergies to make sure that you get the right vaccine.  pregnant or trying to get pregnant  breast-feeding How should I use this medicine? This vaccine is for injection into a muscle or under the skin. It is given by a health care professional. A copy of Vaccine Information Statements will be given before each vaccination. Read this sheet carefully each time. The sheet may change frequently. Talk to your healthcare provider to see which vaccines are right for you. Some vaccines should not be used in all age groups. Overdosage: If you think you have taken too much of this medicine contact a poison control center or emergency room at once. NOTE: This medicine is only for you. Do not share this medicine with others. What if I miss a dose? This does not apply. What may interact with this medicine?  chemotherapy or radiation therapy  medicines that lower your immune system like etanercept, anakinra, infliximab, and adalimumab  medicines that treat or prevent blood clots like warfarin  phenytoin  steroid medicines like prednisone or cortisone  theophylline  vaccines This list may not describe all possible interactions. Give your health care  provider a list of all the medicines, herbs, non-prescription drugs, or dietary supplements you use. Also tell them if you smoke, drink alcohol, or use illegal drugs. Some items may interact with your medicine. What should I watch for while using this medicine? Report any side effects that do not go away within 3 days to your doctor or health care professional. Call your health care provider if any unusual symptoms occur within 6 weeks of receiving this vaccine. You may still catch the flu, but the illness is not usually as bad. You cannot get the flu from the vaccine. The vaccine will not protect against colds or other illnesses that may cause fever. The vaccine is needed every year. What side effects may I notice from receiving this medicine? Side effects that you should report to your doctor or health care professional as soon as possible:  allergic reactions like skin rash, itching or hives, swelling of the face, lips, or tongue Side effects that usually do not require medical attention (report to your doctor or health care professional if they continue or are bothersome):  fever  headache  muscle aches and pains  pain, tenderness, redness, or swelling at the injection site  tiredness This list may not describe all possible side effects. Call your doctor for medical advice about side effects. You may report side effects to FDA at 1-800-FDA-1088. Where should I keep my medicine? The vaccine will be given by a health care professional in a clinic, pharmacy, doctor's office, or other health care setting. You will not be given vaccine doses to store at home. NOTE: This sheet is a summary. It may not cover all possible information. If you have questions about this medicine, talk to your doctor, pharmacist, or health care provider.  2020 Elsevier/Gold Standard (2018-08-28 08:45:43)

## 2019-06-17 NOTE — Telephone Encounter (Signed)
Patient was seen by Aaron Edelman today. He has requested the patient to return to the office in 3 months (around 09/16/2019) to establish with Dr. Carlis Abbott. He will also like for the patient to have a full PFT.   Will route to [facility] for follow up on PFT.

## 2019-06-17 NOTE — Telephone Encounter (Signed)
I would recommend the patient be scheduled for a follow-up in office.  Patient will need a walk in office to further assess her hypoxia.  I would also recommend the patient brings her pulse ox from home so that way we can check to see if it is correlating.  This would also be a good opportunity for [place] to be L discussed with patient that Dr. Lake Bells is not returning to clinic and will remain the physician within our practice but mainly on the inpatient side working with [place] patients at [facility].Wyn Quaker, FNP

## 2019-06-17 NOTE — Progress Notes (Signed)
@Patient  ID: Yolanda Morris, female    DOB: [birth year], 73 y.o.   MRN: [id number]  Chief Complaint  Patient presents with  . Follow-up    for O2. O2 levels drop with exertion. States she first noticed this back in the Winter months after starting pulmonary rehab.     Referring provider: [name], MD  HPI:  73 year old female former smoker followed in our office for COPD  Smoker/ Smoking History: Former smoker.  Quit 1998.  50-pack-year smoking history. Maintenance:  Anoro Ellipta Pt of: Dr. Lake Bells  Former patient of Dr. Gwenette Greet with COPD  Completed pulmonary rehab 2014.   06/17/2019  - Visit   73 year old female former smoker followed in our office for COPD.  Patient presenting to our office today to further evaluate hypoxemia that is been happening at home.  Patient reported that her oxygen levels have been lower and wanted to present to our office to be able to further evaluate.  Patient reports that she has been going to cardiopulmonary rehab and she has been needing oxygen at cardiopulmonary rehab.  Unfortunately this has not been updated at our office.  Patient walked in our office today and did qualify for 2 L of O2 on exertion.  Patient also walked for portable oxygen concentrator as she would prefer 1 of these chronically and she still required 2 L pulsed to maintain oxygen saturations greater than 88%.  Patient continues to be maintained on Anoro Ellipta.  She feels that this is working well for her.  She did not like the high cost of her chronic maintenance nebulized meds.  Patient uses her rescue inhaler 2 times daily.  She is only been using 1 puff each time.  Patient is wondering if she can increase to 2 puffs 1 using her rescue inhaler.  Patient wondering if she can receive her flu vaccine today.     Tests:   Spirometry: PFT's 2011:  FEV1 1.25 (54%), ratio 52, +airtrapping, DLCO 77% [name] 06/2012:  FEV1 1.00 (43%), ratio 51% Spirometry October 2019 FEV1 0.8  L 38% predicted  08/16/2018-chest x-ray-COPD and bibasilar scarring, mild compression fracture mid thoracic vertebral body, new since 2014  08/15/2018-spirometry- FVC 1.5 (56%), ratio 52, FEV1 39  SIX MIN WALK 06/17/2019 06/17/2019 08/15/2018  Supplimental Oxygen during Test? (L/min) Yes Yes No  O2 Flow Rate 2 2 -  Type Pulse Continuous -  Tech Comments: - Patient walked 1 lap before O2 dropped to 86% on room air. Patient walked at a leisurely pace. Patient was placed on 2L of O2. Was able to continue her walk. O2 improved to 95%. SOB sensation went away after she was placed on O2. Denied any CP or leg pain. -     FENO:  No results found for: NITRICOXIDE  PFT: No flowsheet data found.  Imaging: No results found.    Specialty Problems      Pulmonary Problems   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)    PFT's 2011:  FEV1 1.25 (54%), ratio 52, +airtrapping, DLCO 77% [name] 06/2012:  FEV1 1.00 (43%), ratio 51% Completed pulmonary rehab 2014.   08/15/2018-spirometry- FVC 1.5 (56%), ratio 52, FEV1 39       Acute bronchitis   Acute sinusitis   Cough   COPD exacerbation (HCC)   Shortness of breath   Allergic rhinitis   Chronic respiratory failure with hypoxia ([place])    06/17/2019-walk in office patient qualified for 2 L of O2 with physical exertion  Allergies  Allergen Reactions  . Bacitracin     rash  . Levofloxacin     hives    Immunization History  Administered Date(s) Administered  . Fluad Quad(high Dose 65+) 06/17/2019  . Influenza Split 06/18/2011, 07/10/2012, 08/17/2013  . Influenza, High Dose Seasonal PF 07/12/2017  . Influenza,inj,Quad PF,6+ Mos 07/07/2015, 07/11/2016  . Influenza-Unspecified 07/16/2014, 07/16/2018  . Pneumococcal Conjugate-13 12/18/2013  . Pneumococcal Polysaccharide-23 07/10/2012   High-dose flu vaccine today  Past Medical History:  Diagnosis Date  . Cancer ([place])    skin  . COPD (chronic obstructive pulmonary disease)  ([place])   . DM (diabetes mellitus) ([place])   . Dyspnea    increased activity   . Genital herpes   . Hyperlipidemia   . Migraines   . Osteoporosis   . Unspecified essential hypertension     Tobacco History: Social History   Tobacco Use  Smoking Status Former Smoker  . Packs/day: 2.00  . Years: 25.00  . Pack years: 50.00  . Types: Cigarettes  . Quit date: 10/17/1996  . Years since quitting: 22.6  Smokeless Tobacco Never Used   Counseling given: Yes  Continue to not smoke  Outpatient Encounter Medications as of 06/17/2019  Medication Sig  . acetaminophen (TYLENOL) 325 MG tablet Take 650 mg by mouth every 6 (six) hours as needed.  Marland Kitchen albuterol (PROVENTIL HFA;VENTOLIN HFA) 108 (90 Base) MCG/ACT inhaler Inhale 2 puffs into the lungs every 6 (six) hours as needed for wheezing or shortness of breath.  Marland Kitchen albuterol (PROVENTIL) (2.5 MG/3ML) 0.083% nebulizer solution Take 3 mL (2.5 MG/3ML) by nebulization every 6 hours as directed for sob and wheezing.  Marland Kitchen aspirin EC 81 MG EC tablet Take 81 mg by mouth daily.    . calcium carbonate (OS-CAL - DOSED IN MG OF ELEMENTAL CALCIUM) 1250 (500 Ca) MG tablet Take 1 tablet by mouth daily with breakfast.  . chlorpheniramine (CHLOR-TRIMETON) 4 MG tablet Take 4 mg by mouth daily as needed for allergies.  . Cholecalciferol (VITAMIN D3) 1000 UNITS CAPS Take 1 capsule by mouth daily.    . Cranberry 500 MG TABS Take 1 tablet by mouth daily.  Marland Kitchen losartan (COZAAR) 100 MG tablet Take 100 mg by mouth daily.   Marland Kitchen Lysine 500 MG CAPS Take 1 capsule by mouth daily.    . metFORMIN (GLUCOPHAGE-XR) 500 MG 24 hr tablet   . Multiple Vitamins-Minerals (CENTRUM SILVER PO) Take 1 tablet by mouth daily.    Marland Kitchen omeprazole (PRILOSEC) 20 MG capsule Take 20 mg by mouth every other day.   . simvastatin (ZOCOR) 40 MG tablet Take 40 mg by mouth at bedtime.    Marland Kitchen Spacer/Aero-Holding Chambers (AEROCHAMBER Z-STAT PLUS) inhaler Use as instructed  . umeclidinium-vilanterol (ANORO ELLIPTA)  62.5-25 MCG/INH AEPB USE 1 INHALATION DAILY  . valACYclovir (VALTREX) 500 MG tablet Take 500 mg by mouth 2 (two) times daily as needed.    . Zoledronic Acid (RECLAST IV) Inject into the vein. Every 12 months  . [DISCONTINUED] albuterol (PROAIR HFA) 108 (90 Base) MCG/ACT inhaler Inhale 1-2 puffs into the lungs every 6 (six) hours as needed for wheezing or shortness of breath.  . [DISCONTINUED] formoterol (PERFOROMIST) 20 MCG/2ML nebulizer solution Take 2 mLs (20 mcg total) by nebulization 2 (two) times daily.  . [DISCONTINUED] levocetirizine (XYZAL) 5 MG tablet Take 5 mg by mouth every evening.    . [DISCONTINUED] Revefenacin (YUPELRI) 175 MCG/3ML SOLN Inhale 3 mLs into the lungs 2 (two) times daily.   No  facility-administered encounter medications on file as of 06/17/2019.     Review of Systems  Review of Systems  Constitutional: Positive for fatigue. Negative for activity change and fever.  HENT: Negative for sinus pressure, sinus pain and sore throat.   Respiratory: Positive for shortness of breath. Negative for cough and wheezing.   Cardiovascular: Negative for chest pain and palpitations.  Gastrointestinal: Negative for diarrhea, nausea and vomiting.  Musculoskeletal: Negative for arthralgias.  Neurological: Negative for dizziness.  Psychiatric/Behavioral: Negative for sleep disturbance. The patient is not nervous/anxious.      Physical Exam  BP 112/74   Pulse 84   Temp (!) 97.4 F (36.3 C) (Oral)   Ht 5\' 2"  (1.575 m)   Wt 161 lb (73 kg)   SpO2 92%   BMI 29.45 kg/m   Wt Readings from Last 5 Encounters:  06/17/19 161 lb (73 kg)  12/27/18 160 lb 15 oz (73 kg)  12/04/18 160 lb 0.9 oz (72.6 kg)  11/13/18 158 lb 3.2 oz (71.8 kg)  11/09/18 158 lb 11.7 oz (72 kg)    Physical Exam Vitals signs and nursing note reviewed.  Constitutional:      General: She is not in acute distress.    Appearance: Normal appearance. She is normal weight.  HENT:     Head: Normocephalic and  atraumatic.     Right Ear: Tympanic membrane, ear canal and external ear normal. There is no impacted cerumen.     Left Ear: Tympanic membrane, ear canal and external ear normal. There is no impacted cerumen.     Nose: Rhinorrhea present. No congestion.     Mouth/Throat:     Mouth: Mucous membranes are dry.     Pharynx: Oropharynx is clear.  Eyes:     Pupils: Pupils are equal, round, and reactive to light.  Neck:     Musculoskeletal: Normal range of motion.  Cardiovascular:     Rate and Rhythm: Normal rate and regular rhythm.     Pulses: Normal pulses.     Heart sounds: Normal heart sounds. No murmur.  Pulmonary:     Effort: Pulmonary effort is normal. No respiratory distress.     Breath sounds: Normal breath sounds. No decreased air movement. No decreased breath sounds, wheezing or rales.  Abdominal:     General: Abdomen is flat. Bowel sounds are normal.     Palpations: Abdomen is soft.  Skin:    General: Skin is warm and dry.     Capillary Refill: Capillary refill takes less than 2 seconds.  Neurological:     General: No focal deficit present.     Mental Status: She is alert and oriented to person, place, and time. Mental status is at baseline.     Gait: Gait normal.  Psychiatric:        Mood and Affect: Mood normal.        Behavior: Behavior normal.        Thought Content: Thought content normal.        Judgment: Judgment normal.      Lab Results:  CBC No results found for: WBC, RBC, HGB, HCT, PLT, MCV, MCH, MCHC, RDW, LYMPHSABS, MONOABS, EOSABS, BASOSABS  BMET    Component Value Date/Time   NA 141 10/23/2018 1226   K 4.4 10/23/2018 1226   CL 100 10/23/2018 1226   CO2 35 (H) 10/23/2018 1226   GLUCOSE 96 10/23/2018 1226   BUN 12 10/23/2018 1226   CREATININE 0.66 10/23/2018 1226  CALCIUM 10.5 10/23/2018 1226    BNP No results found for: BNP  ProBNP    Component Value Date/Time   PROBNP 18.0 10/23/2018 1226      Assessment & Plan:   COPD GOLD III B  (based on 08/15/18 spiro) Plan: Continue Anoro Ellipta Continue rescue inhaler 2 puffs every 6 hours as needed for shortness of breath or wheezing We will order pulmonary function testing as last PFTs were done in 2011 We will order 2 L of O2 with physical exertion today Follow-up with our office in 3 months with a pulmonary function test as well as to establish with Dr. Carlis Abbott High-dose flu vaccine today  Chronic respiratory failure with hypoxia ([place]) Plan: Start 2 L of O2 with physical exertion Overnight oximetry ordered today    Return in about 3 months (around 09/16/2019), or if symptoms worsen or fail to improve, for Follow up with Dr. Carlis Abbott - 60min appt to establish care, Follow up for PFT.   Lauraine Rinne, NP 06/17/2019   This appointment was 28 minutes long with over 50% of the time in direct face-to-face patient care, assessment, plan of care, and follow-up.

## 2019-06-17 NOTE — Telephone Encounter (Signed)
Primary Pulmonologist: BQ Last office visit and with whom: 11/13/2018 w/ BPM What do we see them for (pulmonary problems): Centrilobular Emphysema, Simple Chronic Bronchitis, Allergic rhinitis  Reason for call: Pt states she is having more dyspnea on exertion and drops in blood oxygen level. She reports getting on the treadmill yesterday 06/16/2019 in the morning to walk 10-12 minutes with breaks in between, and noticed her SpO2 dropped down to 87%. Once she rested, she states it resolved and she was able to get back to exercises. She further notes in the afternoon 06/16/2019 she went to secure a plastic cover from yardwork and when she returned back inside she felt short of breath and her SpO2 read 85%, resolved with rest. Over the phone, I had her take her SpO2, which she reported to be 91-92% after getting up to find the oximeter. Medication wise, she is taking Anoro daily in the morning and reports her breathing feeling the best in the morning because of it. She denies fever/chills/muscle aches. Denies wheezing, chest pain/tightness. Reports an occasional cough, but nothing acute. She is inquiring if she needs an appointment or if Aaron Edelman has any recommendations for her.   In the last month, have you been in contact with someone who was confirmed or suspected to have Conoravirus / COVID-19?  No  Do you have any of the following symptoms developed in the last 30 days? Fever: No Cough: Occasional cough Shortness of breath: Yes, with light exertion  When did your symptoms start? Yesterday morning 06/16/2019  If the patient has a fever, what is the last reading?  (use n/a if patient denies fever)  N/A . IF THE PATIENT STATES THEY DO NOT OWN A THERMOMETER, THEY MUST GO AND PURCHASE ONE When did the fever start?: N/A Have you taken any medication to suppress a fever (ie Ibuprofen, Aleve, Tylenol)?: N/A  Since BQ is not currently in the office, I am routing this message to BPM for follow up. Aaron Edelman,  please advise with your recommendations for this pt. Thank you.

## 2019-06-17 NOTE — Telephone Encounter (Signed)
Call returned to patient, made aware of B, Mack recommendations. Voiced understanding. She states can I see the guy I saw last time. Appt made with Aaron Edelman. Nothing further needed at this time.

## 2019-06-17 NOTE — Assessment & Plan Note (Signed)
Plan: Continue Anoro Ellipta Continue rescue inhaler 2 puffs every 6 hours as needed for shortness of breath or wheezing We will order pulmonary function testing as last PFTs were done in 2011 We will order 2 L of O2 with physical exertion today Follow-up with our office in 3 months with a pulmonary function test as well as to establish with Dr. Carlis Abbott High-dose flu vaccine today

## 2019-06-17 NOTE — Assessment & Plan Note (Signed)
Plan: Start 2 L of O2 with physical exertion Overnight oximetry ordered today

## 2019-06-20 NOTE — Telephone Encounter (Signed)
Recall placed in pt chart to schedule pft and f/u with PC in Nov. -pr

## 2019-06-21 ENCOUNTER — Other Ambulatory Visit: Payer: Self-pay | Admitting: Pulmonary Disease

## 2019-06-25 ENCOUNTER — Telehealth: Payer: Self-pay | Admitting: Pulmonary Disease

## 2019-06-25 NOTE — Telephone Encounter (Signed)
Spoke with patient. She is requesting to have a letter from our office stating that she is using oxygen to be sent to the [facility] for her utilities. If the power were to go out, she would be placed at the top of the list due to her being on O2.   Patient's account number is [id number]. The letter will need to be address to Customer Service. Address is [address] [address] [place], [place] [address].   Aaron Edelman, please advise if you are ok with [place] typing a letter for patient. Thanks!

## 2019-06-25 NOTE — Telephone Encounter (Signed)
Yes ok to generate this letter. I can sign.   Aaron Edelman

## 2019-06-26 ENCOUNTER — Telehealth: Payer: Self-pay | Admitting: Pulmonary Disease

## 2019-06-26 NOTE — Telephone Encounter (Signed)
LVM for patient to let her know the letter was ready. Requested she call back to let us know if she wants the letter mailed to her or for he to come pick it up.  Letter remains attached to unaddressed envelope in pod A until call received from patient.

## 2019-06-26 NOTE — Telephone Encounter (Signed)
Letter mailed. Nothing further needed at this time.

## 2019-06-26 NOTE — Telephone Encounter (Signed)
Letter has been printed and placed at [place]. Will await signature.

## 2019-06-26 NOTE — Telephone Encounter (Signed)
Patient is returning phone call.  Would like for [place] to mail the letter to her home address.  Patient phone number is [phone].

## 2019-06-26 NOTE — Telephone Encounter (Signed)
Attempted to return call. No answer. Left message to return call.  Also will discuss with B. Blackwell and D. Forrester who wrote letter for patient today.

## 2019-07-18 DIAGNOSIS — E1169 Type 2 diabetes mellitus with other specified complication: Secondary | ICD-10-CM | POA: Diagnosis not present

## 2019-07-18 DIAGNOSIS — J449 Chronic obstructive pulmonary disease, unspecified: Secondary | ICD-10-CM | POA: Diagnosis not present

## 2019-07-18 DIAGNOSIS — E785 Hyperlipidemia, unspecified: Secondary | ICD-10-CM | POA: Diagnosis not present

## 2019-07-18 DIAGNOSIS — I10 Essential (primary) hypertension: Secondary | ICD-10-CM | POA: Diagnosis not present

## 2019-07-18 DIAGNOSIS — Z7984 Long term (current) use of oral hypoglycemic drugs: Secondary | ICD-10-CM | POA: Diagnosis not present

## 2019-07-22 ENCOUNTER — Telehealth: Payer: Self-pay | Admitting: Pulmonary Disease

## 2019-07-22 ENCOUNTER — Encounter: Payer: Self-pay | Admitting: Pulmonary Disease

## 2019-07-22 ENCOUNTER — Other Ambulatory Visit: Payer: Self-pay

## 2019-07-22 ENCOUNTER — Ambulatory Visit (INDEPENDENT_AMBULATORY_CARE_PROVIDER_SITE_OTHER): Payer: Medicare Other | Admitting: Pulmonary Disease

## 2019-07-22 DIAGNOSIS — J410 Simple chronic bronchitis: Secondary | ICD-10-CM | POA: Diagnosis not present

## 2019-07-22 DIAGNOSIS — R05 Cough: Secondary | ICD-10-CM | POA: Diagnosis not present

## 2019-07-22 DIAGNOSIS — J9611 Chronic respiratory failure with hypoxia: Secondary | ICD-10-CM | POA: Diagnosis not present

## 2019-07-22 DIAGNOSIS — R52 Pain, unspecified: Secondary | ICD-10-CM | POA: Diagnosis not present

## 2019-07-22 DIAGNOSIS — R0602 Shortness of breath: Secondary | ICD-10-CM

## 2019-07-22 MED ORDER — BENZONATATE 200 MG PO CAPS: 200.0000 mg | ORAL_CAPSULE | Freq: Three times a day (TID) | ORAL | 1 refills | Status: DC | PRN

## 2019-07-22 NOTE — Assessment & Plan Note (Signed)
Plan: Continue oxygen therapy as prescribed 

## 2019-07-22 NOTE — Progress Notes (Signed)
Reviewed, agree 

## 2019-07-22 NOTE — Telephone Encounter (Signed)
Will route to Aaron Edelman to make him aware.

## 2019-07-22 NOTE — Assessment & Plan Note (Signed)
Plan: COVID test today If COVID test negative shortness of breath persist likely will need baseline lab work such as CBC with differential

## 2019-07-22 NOTE — Progress Notes (Signed)
Virtual Visit via Video Note  I connected with Yolanda Morris on 07/22/19 at  2:30 PM EDT by a video enabled telemedicine application and verified that I am speaking with the correct person using two identifiers.  Location: Patient: Home Provider: Office - [facility] - [id number], [address], [place], [place] [address]  I discussed the limitations of evaluation and management by telemedicine and the availability of in person appointments. The patient expressed understanding and agreed to proceed. I also discussed with the patient that there may be a patient responsible charge related to this service. The patient expressed understanding and agreed to proceed.  Patient consented to consult via telephone: Yes People present and their role in pt care: Pt   History of Present Illness: Cough   73 year old female former smoker followed in our office for COPD  Smoker/ Smoking History: Former smoker.  Quit 1998.  50-pack-year smoking history. Maintenance:  Anoro Ellipta Pt of: Dr. Lake Bells, will establish with Dr. Carlis Abbott.  Chief complaint: Dry cough, body aches, increased fatigue, increased shortness of breath  73 year old female former smoker followed in our office for COPD completing a tele-visit with our office today because symptoms started on 07/18/2019 of increased dry cough, chest tightness, increased fatigue, and increased shortness of breath.  Patient does endorse also that she has been having some body aches.  No recorded temperatures.  Patient attempted to use thermometer over the phone today and was unsuccessful.  Patient does not "feel like she has a temperature".  Patient denies any audible wheezing.  Patient also reports she feels that she is not sleeping as well at night.  She denies any sort of SARS-CoV-2 exposures but patient does run errands in the community but wears a mask.  Patient continues to use her Anoro Ellipta.  She continues to use her rescue inhaler as needed.   Patient has an upcoming pulmonary function test scheduled in November/2020 as well as a office visit with Dr. Carlis Abbott to establish.   Observations/Objective:  Spirometry: PFT's 2011:  FEV1 1.25 (54%), ratio 52, +airtrapping, DLCO 77% [name] 06/2012:  FEV1 1.00 (43%), ratio 51% Spirometry October 2019 FEV1 0.8 L 38% predicted  08/16/2018-chest x-ray-COPD and bibasilar scarring, mild compression fracture mid thoracic vertebral body, new since 2014  08/15/2018-spirometry- FVC 1.5 (56%), ratio 52, FEV1 39  Assessment and Plan:  Chronic respiratory failure with hypoxia (HCC) Plan: Continue oxygen therapy as prescribed  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) Plan: Continue Anoro Ellipta Continue rescue inhaler We will order outpatient COVID testing If negative for COVID can consider outpatient treatment for COPD exacerbation Continue forward with November/2020 pulmonary function test as well as appointment to establish with Dr. Carlis Abbott  Body aches Plan: COVID test  Cough Plan: COVID test Tessalon Perles Can use over-the-counter cough medicine such as Delsym or Mucinex D Could consider treatment as COPD exacerbation if COVID test is negative  Shortness of breath Plan: COVID test today If COVID test negative shortness of breath persist likely will need baseline lab work such as CBC with differential    Follow Up Instructions:  Return in about 2 months (around 09/21/2019), or if symptoms worsen or fail to improve, for Follow up for PFT, Follow up with Dr. Carlis Abbott.    I discussed the assessment and treatment plan with the patient. The patient was provided an opportunity to ask questions and all were answered. The patient agreed with the plan and demonstrated an understanding of the instructions.   The  patient was advised to call back or seek an in-person evaluation if the symptoms worsen or if the condition fails to improve as anticipated.  I provided 26 minutes of  non-face-to-face time during this encounter.   Lauraine Rinne, NP

## 2019-07-22 NOTE — Telephone Encounter (Signed)
Primary Pulmonologist: [place] Last office visit and with whom: 06/17/2019 with Aaron Edelman What do we see them for (pulmonary problems): Chronic Bronchitis  Reason for call:  Spoke with pt. States that she hasn't been feeling well. Reports increased cough, shortness of breath, chest tightness, lightheadedness, fatigue and diarrhea. Cough is non productive. Denies wheezing, fever, chills, recent travel or sick contacts that she is aware of. She has been using 2L qhs to help with her chest tightness and shortness of breath and it has helped a little. Pt would like recommendations.  In the last month, have you been in contact with someone who was confirmed or suspected to have Conoravirus / COVID-19?  No  Do you have any of the following symptoms developed in the last 30 days? Fever: No Cough: Yes - dry Shortness of breath: Yes  When did your symptoms start?  Thursday  If the patient has a fever, what is the last reading?  (use n/a if patient denies fever)  N/A  Judson Roch - please advise. Thanks.

## 2019-07-22 NOTE — Telephone Encounter (Signed)
Burman Nieves,   Can we follow-up on the process regarding this?  COVID testing sites are turning away patients who are being routed there for testing?Wyn Quaker, FNP

## 2019-07-22 NOTE — Patient Instructions (Signed)
You were seen today by Lauraine Rinne, NP  for:   1. Cough  - Novel Coronavirus, NAA ([facility])  COVID Testing Site Locations (For sick patients only, pre-procedure is done differently)  . [facility] [address], [place], [place] [address] . [place]  (on [employer])  o Nature conservation officer  . [facility] Short [facility]   Cough Home Instructions:   Marland Kitchen Medications to use:  o Mucinex DM 1-2 every 12 hrs or Delsym 2 tsp every 12 hrs for cough (These are Over the counter) o Tessalon Three times a day  As needed  Cough.    2. Shortness of breath  - Novel Coronavirus, NAA ([facility])  3. Chronic respiratory failure with hypoxia (HCC)  Continue oxygen therapy as prescribed  >>>maintain oxygen saturations greater than 88 percent  >>>if unable to maintain oxygen saturations please contact the office  >>>do not smoke with oxygen  >>>can use nasal saline gel or nasal saline rinses to moisturize nose if oxygen causes dryness   4. COPD ([place])  Anoro Ellipta  >>> Take 1 puff daily in the morning right when you wake up >>>Rinse your mouth out after use >>>This is a daily maintenance inhaler, NOT a rescue inhaler >>>Contact our office if you are having difficulties affording or obtaining this medication >>>It is important for you to be able to take this daily and not miss any doses   Only use your albuterol as a rescue medication to be used if you can't catch your breath by resting or doing a relaxed purse lip breathing pattern.  - The less you use it, the better it will work when you need it. - Ok to use up to 2 puffs  every 4 hours if you must but call for immediate appointment if use goes up over your usual need - Don't leave home without it !!  (think of it like the spare tire for your car)    5. Body aches  - Novel Coronavirus, NAA ([facility])   We recommend today:  Orders Placed This Encounter  Procedures  . Novel Coronavirus,  NAA ([facility])    Order Specific Question:   Is this test for diagnosis or screening    Answer:   Diagnosis of ill patient    Order Specific Question:   Symptomatic for COVID-19 as defined by CDC    Answer:   Yes    Order Specific Question:   Date of Symptom Onset    Answer:   07/18/2019    Order Specific Question:   Hospitalized for COVID-19    Answer:   No    Order Specific Question:   Admitted to ICU for COVID-19    Answer:   No    Order Specific Question:   Previously tested for COVID-19    Answer:   No    Order Specific Question:   Resident in a congregate (group) care setting    Answer:   No    Order Specific Question:   Is the patient student?    Answer:   No    Order Specific Question:   Employed in healthcare setting    Answer:   No    Order Specific Question:   Pregnant    Answer:   No   Orders Placed This Encounter  Procedures  . Novel Coronavirus, NAA ([facility])   No orders of the defined types  were placed in this encounter.   Follow Up:    Return in about 2 months (around 09/21/2019), or if symptoms worsen or fail to improve, for Follow up for PFT, Follow up with Dr. Carlis Abbott.   Please do your part to reduce the spread of COVID-19:      Reduce your risk of any infection  and COVID19 by using the similar precautions used for avoiding the common cold or flu:  Marland Kitchen Wash your hands often with soap and warm water for at least 20 seconds.  If soap and water are not readily available, use an alcohol-based hand sanitizer with at least 60% alcohol.  . If coughing or sneezing, cover your mouth and nose by coughing or sneezing into the elbow areas of your shirt or coat, into a tissue or into your sleeve (not your hands). Langley Gauss A MASK when in public  . Avoid shaking hands with others and consider head nods or verbal greetings only. . Avoid touching your eyes, nose, or mouth with unwashed hands.  . Avoid close contact with people who are sick. . Avoid places or events with large  numbers of people in one location, like concerts or sporting events. . If you have some symptoms but not all symptoms, continue to monitor at home and seek medical attention if your symptoms worsen. . If you are having a medical emergency, call 911.   [place] / e-Visit: [email or web address]         [facility]: [facility]: [id number]                   [facility]: [id number]     It is flu season:   >>> Best ways to protect herself from the flu: Receive the yearly flu vaccine, practice good hand hygiene washing with soap and also using hand sanitizer when available, eat a nutritious meals, get adequate rest, hydrate appropriately   Please contact the office if your symptoms worsen or you have concerns that you are not improving.   Thank you for choosing [facility] for your healthcare, and for allowing [place] to partner with you on your healthcare journey. I am thankful to be able to provide care to you today.   Wyn Quaker FNP-C

## 2019-07-22 NOTE — Assessment & Plan Note (Signed)
Plan: Continue Anoro Ellipta Continue rescue inhaler We will order outpatient COVID testing If negative for COVID can consider outpatient treatment for COPD exacerbation Continue forward with November/2020 pulmonary function test as well as appointment to establish with Dr. Carlis Abbott

## 2019-07-22 NOTE — Assessment & Plan Note (Signed)
Plan: COVID test

## 2019-07-22 NOTE — Telephone Encounter (Signed)
Spoke with pt. She has been scheduled for a MyChart video visit with Aaron Edelman today at 1430. Nothing further was needed.

## 2019-07-22 NOTE — Assessment & Plan Note (Signed)
Plan: COVID test Tessalon Perles Can use over-the-counter cough medicine such as Delsym or Mucinex D Could consider treatment as COPD exacerbation if COVID test is negative

## 2019-07-22 NOTE — Telephone Encounter (Signed)
Please schedule video visit with any APP this afternoon. Thanks

## 2019-07-23 ENCOUNTER — Other Ambulatory Visit: Payer: Self-pay

## 2019-07-23 DIAGNOSIS — Z20822 Contact with and (suspected) exposure to covid-19: Secondary | ICD-10-CM

## 2019-07-23 DIAGNOSIS — Z20828 Contact with and (suspected) exposure to other viral communicable diseases: Secondary | ICD-10-CM | POA: Diagnosis not present

## 2019-07-23 NOTE — Telephone Encounter (Signed)
Patient went for community testing and there were so many people, the patient was turned away.  Patient was able to get tested this morning 07/23/19.  I will route this to [place] as FYI.

## 2019-07-23 NOTE — Telephone Encounter (Signed)
Yeah I am aware.  I am just shocked that were turning away people who are being sent for COVID testing.  That does not make any sense to me.  Glad patient got tested.Aaron Edelman

## 2019-07-25 ENCOUNTER — Telehealth: Payer: Self-pay | Admitting: Pulmonary Disease

## 2019-07-25 LAB — NOVEL CORONAVIRUS, NAA: SARS-CoV-2, NAA: NOT DETECTED

## 2019-07-25 MED ORDER — AZITHROMYCIN 250 MG PO TABS: ORAL_TABLET | ORAL | 0 refills | Status: DC

## 2019-07-25 MED ORDER — PREDNISONE 10 MG PO TABS: ORAL_TABLET | ORAL | 0 refills | Status: DC

## 2019-07-25 NOTE — Progress Notes (Signed)
Negative covid test.  This is good news.  We can offer the patient:  Azithromycin 250mg  tablet  >>>Take 2 tablets (500mg  total) today, and then 1 tablet (250mg ) for the next four days  >>>take with food  >>>can also take probiotic and / or yogurt while on antibiotic   Prednisone 10mg  tablet  >>>4 tabs for 2 days, then 3 tabs for 2 days, 2 tabs for 2 days, then 1 tab for 2 days, then stop >>>take with food  >>>take in the morning   Please place the order.   Wyn Quaker FNP

## 2019-07-25 NOTE — Telephone Encounter (Signed)
Call returned to patient, confirmed DOB. Inquiring about whether her medication was sent in:  Notes recorded by Lauraine Rinne, NP on 07/25/2019 at 9:29 AM EDT  Negative covid test. This is good news. We can offer the patient:   Azithromycin 250mg  tablet  >>>Take 2 tablets (500mg  total) today, and then 1 tablet (250mg ) for the next four days  >>>take with food  >>>can also take probiotic and / or yogurt while on antibiotic   Prednisone 10mg  tablet  >>>4 tabs for 2 days, then 3 tabs for 2 days, 2 tabs for 2 days, then 1 tab for 2 days, then stop  >>>take with food  >>>take in the morning   Please place the order.   Wyn Quaker FNP   Aware of results. Confirmed pharmacy. Medication sent in. Patient aware. Nothing further needed at this time.

## 2019-07-30 NOTE — Progress Notes (Signed)
Discharge Progress Report  Patient Details  Name: Yolanda Morris MRN: [id number] Date of Birth: 22-Jul-1946 Referring Provider:     Pulmonary Rehab Walk Test from 11/15/2018 in [facility]  Referring Provider  mcquaid       Number of Visits: 10  Reason for Discharge:  Early Exit:  due to department closure for COVID-19.  Smoking History:  Social History   Tobacco Use  Smoking Status Former Smoker  . Packs/day: 2.00  . Years: 25.00  . Pack years: 50.00  . Types: Cigarettes  . Quit date: 10/17/1996  . Years since quitting: 22.7  Smokeless Tobacco Never Used    Diagnosis:  Centrilobular emphysema (HCC)  ADL UCSD:   Initial Exercise Prescription:   Discharge Exercise Prescription (Final Exercise Prescription Changes):   Functional Capacity:   Psychological, QOL, Others - Outcomes: PHQ 2/9: Depression screen PHQ 2/9 11/09/2018  Decreased Interest 0  Down, Depressed, Hopeless 0  PHQ - 2 Score 0  Altered sleeping 0  Tired, decreased energy 1  Change in appetite 3  Feeling bad or failure about yourself  0  Trouble concentrating 2  Moving slowly or fidgety/restless 0  Suicidal thoughts 0  PHQ-9 Score 6  Difficult doing work/chores Somewhat difficult    Quality of Life:   Personal Goals: Goals established at orientation with interventions provided to work toward goal.    Personal Goals Discharge:   Exercise Goals and Review:   Exercise Goals Re-Evaluation:   Nutrition & Weight - Outcomes:    Nutrition:   Nutrition Discharge:   Education Questionnaire Score:   Goals reviewed with patient; copy given to patient.

## 2019-07-30 NOTE — Addendum Note (Signed)
Encounter addended by: Lance Morin, RN on: 07/30/2019 4:14 PM  Actions taken: Clinical Note Signed, Episode resolved

## 2019-08-02 ENCOUNTER — Other Ambulatory Visit: Payer: Self-pay | Admitting: Family Medicine

## 2019-08-02 DIAGNOSIS — Z1231 Encounter for screening mammogram for malignant neoplasm of breast: Secondary | ICD-10-CM

## 2019-08-09 DIAGNOSIS — I10 Essential (primary) hypertension: Secondary | ICD-10-CM | POA: Diagnosis not present

## 2019-08-09 DIAGNOSIS — E785 Hyperlipidemia, unspecified: Secondary | ICD-10-CM | POA: Diagnosis not present

## 2019-08-09 DIAGNOSIS — E1169 Type 2 diabetes mellitus with other specified complication: Secondary | ICD-10-CM | POA: Diagnosis not present

## 2019-09-05 DIAGNOSIS — E119 Type 2 diabetes mellitus without complications: Secondary | ICD-10-CM | POA: Diagnosis not present

## 2019-09-05 DIAGNOSIS — Z7984 Long term (current) use of oral hypoglycemic drugs: Secondary | ICD-10-CM | POA: Diagnosis not present

## 2019-09-06 ENCOUNTER — Other Ambulatory Visit (HOSPITAL_COMMUNITY)
Admission: RE | Admit: 2019-09-06 | Discharge: 2019-09-06 | Disposition: A | Discharge status: Home / Self Care | Payer: Medicare Other | Source: Ambulatory Visit | Attending: Critical Care Medicine | Admitting: Critical Care Medicine

## 2019-09-06 DIAGNOSIS — Z20828 Contact with and (suspected) exposure to other viral communicable diseases: Secondary | ICD-10-CM | POA: Diagnosis not present

## 2019-09-06 LAB — SARS CORONAVIRUS 2 (TAT 6-24 HRS): SARS Coronavirus 2: NEGATIVE

## 2019-09-09 ENCOUNTER — Ambulatory Visit (INDEPENDENT_AMBULATORY_CARE_PROVIDER_SITE_OTHER): Payer: Medicare Other | Admitting: Critical Care Medicine

## 2019-09-09 ENCOUNTER — Encounter: Payer: Self-pay | Admitting: Critical Care Medicine

## 2019-09-09 ENCOUNTER — Other Ambulatory Visit: Payer: Self-pay

## 2019-09-09 VITALS — BP 128/72 | HR 78 | Temp 97.7°F | Ht 62.0 in | Wt 166.0 lb

## 2019-09-09 DIAGNOSIS — J449 Chronic obstructive pulmonary disease, unspecified: Secondary | ICD-10-CM | POA: Diagnosis not present

## 2019-09-09 DIAGNOSIS — J410 Simple chronic bronchitis: Secondary | ICD-10-CM

## 2019-09-09 DIAGNOSIS — J9611 Chronic respiratory failure with hypoxia: Secondary | ICD-10-CM | POA: Diagnosis not present

## 2019-09-09 LAB — PULMONARY FUNCTION TEST
DL/VA % pred: 115 %
DL/VA: 4.83 ml/min/mmHg/L
DLCO unc % pred: 92 %
DLCO unc: 16.62 ml/min/mmHg
FEF 25-75 Post: 0.47 L/sec
FEF 25-75 Pre: 0.46 L/sec
FEF2575-%Change-Post: 3 %
FEF2575-%Pred-Post: 28 %
FEF2575-%Pred-Pre: 27 %
FEV1-%Change-Post: 1 %
FEV1-%Pred-Post: 50 %
FEV1-%Pred-Pre: 49 %
FEV1-Post: 1 L
FEV1-Pre: 0.99 L
FEV1FVC-%Change-Post: -2 %
FEV1FVC-%Pred-Pre: 77 %
FEV6-%Change-Post: 3 %
FEV6-%Pred-Post: 68 %
FEV6-%Pred-Pre: 65 %
FEV6-Post: 1.72 L
FEV6-Pre: 1.66 L
FEV6FVC-%Change-Post: 0 %
FEV6FVC-%Pred-Post: 102 %
FEV6FVC-%Pred-Pre: 103 %
FVC-%Change-Post: 4 %
FVC-%Pred-Post: 66 %
FVC-%Pred-Pre: 63 %
FVC-Post: 1.77 L
FVC-Pre: 1.69 L
Post FEV1/FVC ratio: 57 %
Post FEV6/FVC ratio: 97 %
Pre FEV1/FVC ratio: 58 %
Pre FEV6/FVC Ratio: 98 %
RV % pred: 185 %
RV: 4 L
TLC % pred: 125 %
TLC: 5.98 L

## 2019-09-09 MED ORDER — ANORO ELLIPTA 62.5-25 MCG/INH IN AEPB: 1.0000 | INHALATION_SPRAY | Freq: Every day | RESPIRATORY_TRACT | 11 refills | Status: DC

## 2019-09-09 MED ORDER — ALBUTEROL SULFATE HFA 108 (90 BASE) MCG/ACT IN AERS: 2.0000 | INHALATION_SPRAY | Freq: Four times a day (QID) | RESPIRATORY_TRACT | 11 refills | Status: DC | PRN

## 2019-09-09 MED ORDER — ANORO ELLIPTA 62.5-25 MCG/INH IN AEPB: 1.0000 | INHALATION_SPRAY | Freq: Every day | RESPIRATORY_TRACT | 0 refills | Status: DC

## 2019-09-09 NOTE — Progress Notes (Signed)
Full PFT performed today. °

## 2019-09-09 NOTE — Patient Instructions (Addendum)
Thank you for visiting Dr. Carlis Abbott at [facility]. We recommend the following:   Meds ordered this encounter  Medications  . umeclidinium-vilanterol (ANORO ELLIPTA) 62.5-25 MCG/INH AEPB    Sig: Inhale 1 puff into the lungs daily.    Dispense:  60 each    Refill:  11  . albuterol (VENTOLIN HFA) 108 (90 Base) MCG/ACT inhaler    Sig: Inhale 2 puffs into the lungs every 6 (six) hours as needed for wheezing or shortness of breath.    Dispense:  6.7 g    Refill:  11    Return in about 6 months (around 03/08/2020).    Please do your part to reduce the spread of COVID-19.

## 2019-09-09 NOTE — Progress Notes (Signed)
Dr. Carlis Abbott discussed results with patient in office.  Nothing further is needed at this time.  Wyn Quaker FNP

## 2019-09-09 NOTE — Progress Notes (Signed)
Synopsis: Referred in March 2012 for COPD by [name], MD.  Previously patient of Dr. Normajean Baxter and Dr. Lake Bells.  Subjective:   PATIENT ID: Yolanda Morris DOB: 05-18-1946, MRN: [id number]  Chief Complaint  Patient presents with  . Follow-up    Patient is on home O2 when sleeping and when working out. Pt was having to use rescue inhaler more than normal beacuse she felt like nebulizer wasn't helping.    Yolanda Morris is a 73 year old woman who presents for follow-up of COPD and chronic hypoxic respiratory failure.  She was recently treated with prednisone and azithromycin in October for exacerbation.  Her previous exacerbations of this have been in the winter 2000 19-2020.  She has 1-2 exacerbations per year.  She continues to use Anoro every day and never misses doses.  She has been using her albuterol less often.  She short of breath, she stops to rest.  She has not been wheezing, coughing, or having sputum production, which is her baseline.  She has been walking on the treadmill, about 30 minutes/day.  She takes about 2 breaks during this.  She has completed pulmonary rehab twice in the past, once earlier this year.  In August 2020 she was started on home oxygen, 2 L with exertion and sleep.  She notices that her sleep is improved and she is less tired with using nighttime oxygen.  She does not use her oxygen all the time at home, but does check her saturations when she is feeling significantly dyspneic, reporting saturations as low as 85% when she is on room air.  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  She is a former smoker who quit 1998 after 50 pack years of smoking.  She is up-to-date on seasonal flu and pneumonia vaccines.      Past Medical History:  Diagnosis Date  . Cancer ([place])    skin  . COPD (chronic obstructive pulmonary disease) ([place])   . DM (diabetes mellitus) ([place])   . Dyspnea    increased activity   . Genital herpes   . Hyperlipidemia    . Migraines   . Osteoporosis   . Unspecified essential hypertension      Family History  Problem Relation Age of Onset  . COPD Mother        deceased  . Heart disease Mother   . Alcohol abuse Brother        deceased  . Alcohol abuse Sister        deceased  . Alcohol abuse Father      Past Surgical History:  Procedure Laterality Date  . BREAST LUMPECTOMY     benign  . COLON SURGERY     removal of polyps  . DENTAL SURGERY    . RETINAL TEAR REPAIR CRYOTHERAPY      Social History   Socioeconomic History  . Marital status: Married    Spouse name: Not on file  . Number of children: Not on file  . Years of education: Not on file  . Highest education level: Not on file  Occupational History  . Occupation: Development worker, community  Social Needs  . Financial resource strain: Not on file  . Food insecurity    Worry: Not on file    Inability: Not on file  . Transportation needs    Medical: Not on file    Non-medical: Not on file  Tobacco Use  . Smoking status: Former Smoker    Packs/day: 2.00  Years: 25.00    Pack years: 50.00    Types: Cigarettes    Quit date: 10/17/1996    Years since quitting: 22.9  . Smokeless tobacco: Never Used  Substance and Sexual Activity  . Alcohol use: No    Alcohol/week: 0.0 standard drinks  . Drug use: No  . Sexual activity: Not on file  Lifestyle  . Physical activity    Days per week: Not on file    Minutes per session: Not on file  . Stress: Not on file  Relationships  . Social Herbalist on phone: Not on file    Gets together: Not on file    Attends religious service: Not on file    Active member of club or organization: Not on file    Attends meetings of clubs or organizations: Not on file    Relationship status: Not on file  . Intimate partner violence    Fear of current or ex partner: Not on file    Emotionally abused: Not on file    Physically abused: Not on file    Forced sexual activity: Not on file   Other Topics Concern  . Not on file  Social History Narrative   No children     Allergies  Allergen Reactions  . Bacitracin     rash  . Levofloxacin     hives     Immunization History  Administered Date(s) Administered  . Fluad Quad(high Dose 65+) 06/17/2019  . Influenza Split 06/18/2011, 07/10/2012, 08/17/2013  . Influenza, High Dose Seasonal PF 07/12/2017  . Influenza,inj,Quad PF,6+ Mos 07/07/2015, 07/11/2016  . Influenza-Unspecified 07/16/2014, 07/16/2018  . Pneumococcal Conjugate-13 12/18/2013  . Pneumococcal Polysaccharide-23 07/10/2012    Outpatient Medications Prior to Visit  Medication Sig Dispense Refill  . acetaminophen (TYLENOL) 325 MG tablet Take 650 mg by mouth every 6 (six) hours as needed.    Marland Kitchen albuterol (PROVENTIL HFA;VENTOLIN HFA) 108 (90 Base) MCG/ACT inhaler Inhale 2 puffs into the lungs every 6 (six) hours as needed for wheezing or shortness of breath. 3 Inhaler 3  . albuterol (PROVENTIL) (2.5 MG/3ML) 0.083% nebulizer solution Take 3 mL (2.5 MG/3ML) by nebulization every 6 hours as directed for sob and wheezing. 360 mL 3  . aspirin EC 81 MG EC tablet Take 81 mg by mouth daily.      . calcium carbonate (OS-CAL - DOSED IN MG OF ELEMENTAL CALCIUM) 1250 (500 Ca) MG tablet Take 1 tablet by mouth daily with breakfast.    . cetirizine (ZYRTEC) 10 MG tablet Take 10 mg by mouth daily.    . chlorpheniramine (CHLOR-TRIMETON) 4 MG tablet Take 4 mg by mouth daily as needed for allergies.    . Cholecalciferol (VITAMIN D3) 1000 UNITS CAPS Take 1 capsule by mouth daily.      . Cranberry 500 MG TABS Take 1 tablet by mouth daily.    Marland Kitchen losartan (COZAAR) 100 MG tablet Take 100 mg by mouth daily.     Marland Kitchen Lysine 500 MG CAPS Take 1 capsule by mouth daily.      . metFORMIN (GLUCOPHAGE-XR) 500 MG 24 hr tablet     . Multiple Vitamins-Minerals (CENTRUM SILVER PO) Take 1 tablet by mouth daily.      Marland Kitchen omeprazole (PRILOSEC) 20 MG capsule Take 20 mg by mouth every other day.     .  simvastatin (ZOCOR) 40 MG tablet Take 40 mg by mouth at bedtime.      Marland Kitchen Spacer/Aero-Holding Chambers (AEROCHAMBER  Z-STAT PLUS) inhaler Use as instructed 1 each 0  . valACYclovir (VALTREX) 500 MG tablet Take 500 mg by mouth 2 (two) times daily as needed.      Jearl Klinefelter ELLIPTA 62.5-25 MCG/INH AEPB INHALE ONCE A DAY AS DIRECTED 60 each 5  . Zoledronic Acid (RECLAST IV) Inject into the vein. Every 12 months    . azithromycin (ZITHROMAX) 250 MG tablet Take 2 tablets today, then 1 tablet daily until gone. 6 tablet 0  . benzonatate (TESSALON) 200 MG capsule Take 1 capsule (200 mg total) by mouth 3 (three) times daily as needed for cough. 30 capsule 1  . predniSONE (DELTASONE) 10 MG tablet 4 tabs for 2 days, 3 tabs for 2 days, 2 tabs for 2 days, 1 tab for 2 days, then STOP. Take in the morning with food. 20 tablet 0   No facility-administered medications prior to visit.     Review of Systems  Constitutional: Negative for chills, fever and weight loss.  HENT: Negative.   Respiratory: Negative for cough, sputum production and wheezing.        DOE at baseline  Cardiovascular: Negative for chest pain and leg swelling.  Gastrointestinal: Negative.   Genitourinary: Negative.   Musculoskeletal: Negative.   Skin: Negative for rash.     Objective:   Vitals:   09/09/19 1016  BP: 128/72  Pulse: 78  Temp: 97.7 F (36.5 C)  TempSrc: Temporal  SpO2: 91%  Weight: 166 lb (75.3 kg)  Height: 5\' 2"  (1.575 m)   91% on   RA BMI Readings from Last 3 Encounters:  09/09/19 30.36 kg/m  06/17/19 29.45 kg/m  12/27/18 29.44 kg/m   Wt Readings from Last 3 Encounters:  09/09/19 166 lb (75.3 kg)  06/17/19 161 lb (73 kg)  12/27/18 160 lb 15 oz (73 kg)    Physical Exam Vitals signs reviewed.  Constitutional:      General: She is not in acute distress.    Appearance: Normal appearance. She is not ill-appearing.  HENT:     Head: Normocephalic and atraumatic.     Nose:     Comments: Deferred due to  masking requirement.    Mouth/Throat:     Comments: Deferred due to masking requirement. Eyes:     General: No scleral icterus. Neck:     Musculoskeletal: Neck supple.  Cardiovascular:     Rate and Rhythm: Normal rate and regular rhythm.     Heart sounds: No murmur.  Pulmonary:     Comments: Breathing comfortably on RA, no tachypnea or conversational dyspnea, CTAB but reduced breath sounds Abdominal:     General: There is no distension.     Palpations: Abdomen is soft.     Tenderness: There is no abdominal tenderness.  Musculoskeletal:        General: No swelling or deformity.  Lymphadenopathy:     Cervical: No cervical adenopathy.  Skin:    General: Skin is warm and dry.     Findings: No rash.  Neurological:     Mental Status: She is alert.     Motor: No weakness.     Coordination: Coordination normal.  Psychiatric:        Mood and Affect: Mood normal.        Behavior: Behavior normal.       Alpha-1 antitrypsin 117  Covid negative 07/23/2019 & 09/06/2019   Chest Imaging- films reviewed: CXR, 2 view 10/23/2018-basilar scarring bilaterally, increased AP diameter, kyphosis.  Grossly unchanged from  08/15/2018.  Pulmonary Functions Testing Results: PFT Results Latest Ref Rng & Units 09/09/2019  FVC-Pre L 1.69  FVC-Predicted Pre % 63  FVC-Post L 1.77  FVC-Predicted Post % 66  Pre FEV1/FVC % % 58  Post FEV1/FCV % % 57  FEV1-Pre L 0.99  FEV1-Predicted Pre % 49  FEV1-Post L 1.00  DLCO UNC% % 92  DLCO COR %Predicted % 115  TLC L 5.98  TLC % Predicted % 125  RV % Predicted % 185   2020- Severe obstruction with hyperinflation and air trapping.  No diffusion impairment.  Spirometry: PFT's 2011:  FEV1 1.25 (54%), ratio 52, +airtrapping, DLCO 77% [name] 06/2012:  FEV1 1.00 (43%), ratio 51% Spirometry 07/2018: FEV1 0.8 L 38% predicted Spirometry 08/15/2018- FVC 1.5 (56%), ratio 52, FEV1 39      Assessment & Plan:     ICD-10-CM   1. Chronic respiratory failure with  hypoxia (HCC)  J96.11 umeclidinium-vilanterol (ANORO ELLIPTA) 62.5-25 MCG/INH AEPB    albuterol (VENTOLIN HFA) 108 (90 Base) MCG/ACT inhaler  2. Chronic obstructive pulmonary disease, unspecified COPD type ([place])  J44.9 umeclidinium-vilanterol (ANORO ELLIPTA) 62.5-25 MCG/INH AEPB    albuterol (VENTOLIN HFA) 108 (90 Base) MCG/ACT inhaler    COPD- GOLD group B -Up-to-date on seasonal flu and pneumonia vaccines -Continue Anoro daily -Continue albuterol as needed for symptoms. -She is been educated to contact us early if she has a change in symptoms such as increased need for albuterol or DuoNebs, increased cough, sputum production, wheezing. -Encouraged to continue regular daily exercise to maintain her exercise tolerance -Congratulated her on her ongoing avoidance of tobacco -Up-to-date on pneumonia and flu vaccines -Recommended to continue following Covid precautions-handwashing, mask wearing, social distancing, especially through the holidays.  Chronic hypoxic respiratory failure on 2 L home oxygen with exertion and sleep -Continue supplemental oxygen as prescribed. -Encouraged to check her saturations throughout the day when she is on room air to ensure that she is not hypoxic more than she realized.   RTC in 6 months.    Current Outpatient Medications:  .  acetaminophen (TYLENOL) 325 MG tablet, Take 650 mg by mouth every 6 (six) hours as needed., Disp: , Rfl:  .  albuterol (PROVENTIL HFA;VENTOLIN HFA) 108 (90 Base) MCG/ACT inhaler, Inhale 2 puffs into the lungs every 6 (six) hours as needed for wheezing or shortness of breath., Disp: 3 Inhaler, Rfl: 3 .  albuterol (PROVENTIL) (2.5 MG/3ML) 0.083% nebulizer solution, Take 3 mL (2.5 MG/3ML) by nebulization every 6 hours as directed for sob and wheezing., Disp: 360 mL, Rfl: 3 .  aspirin EC 81 MG EC tablet, Take 81 mg by mouth daily.  , Disp: , Rfl:  .  calcium carbonate (OS-CAL - DOSED IN MG OF ELEMENTAL CALCIUM) 1250 (500 Ca) MG tablet,  Take 1 tablet by mouth daily with breakfast., Disp: , Rfl:  .  cetirizine (ZYRTEC) 10 MG tablet, Take 10 mg by mouth daily., Disp: , Rfl:  .  chlorpheniramine (CHLOR-TRIMETON) 4 MG tablet, Take 4 mg by mouth daily as needed for allergies., Disp: , Rfl:  .  Cholecalciferol (VITAMIN D3) 1000 UNITS CAPS, Take 1 capsule by mouth daily.  , Disp: , Rfl:  .  Cranberry 500 MG TABS, Take 1 tablet by mouth daily., Disp: , Rfl:  .  losartan (COZAAR) 100 MG tablet, Take 100 mg by mouth daily. , Disp: , Rfl:  .  Lysine 500 MG CAPS, Take 1 capsule by mouth daily.  , Disp: , Rfl:  .  metFORMIN (GLUCOPHAGE-XR) 500 MG 24 hr tablet, , Disp: , Rfl:  .  Multiple Vitamins-Minerals (CENTRUM SILVER PO), Take 1 tablet by mouth daily.  , Disp: , Rfl:  .  omeprazole (PRILOSEC) 20 MG capsule, Take 20 mg by mouth every other day. , Disp: , Rfl:  .  simvastatin (ZOCOR) 40 MG tablet, Take 40 mg by mouth at bedtime.  , Disp: , Rfl:  .  Spacer/Aero-Holding Chambers (AEROCHAMBER Z-STAT PLUS) inhaler, Use as instructed, Disp: 1 each, Rfl: 0 .  umeclidinium-vilanterol (ANORO ELLIPTA) 62.5-25 MCG/INH AEPB, Inhale 1 puff into the lungs daily., Disp: 60 each, Rfl: 11 .  valACYclovir (VALTREX) 500 MG tablet, Take 500 mg by mouth 2 (two) times daily as needed.  , Disp: , Rfl:  .  albuterol (VENTOLIN HFA) 108 (90 Base) MCG/ACT inhaler, Inhale 2 puffs into the lungs every 6 (six) hours as needed for wheezing or shortness of breath., Disp: 6.7 g, Rfl: 11 .  umeclidinium-vilanterol (ANORO ELLIPTA) 62.5-25 MCG/INH AEPB, Inhale 1 puff into the lungs daily., Disp: 60 each, Rfl: 0 .  Zoledronic Acid (RECLAST IV), Inject into the vein. Every 12 months, Disp: , Rfl:    Julian Hy, DO [facility] 09/09/2019 4:15 PM

## 2019-09-23 ENCOUNTER — Ambulatory Visit
Admission: RE | Admit: 2019-09-23 | Discharge: 2019-09-23 | Disposition: A | Discharge status: Home / Self Care | Payer: Medicare Other | Source: Ambulatory Visit | Attending: Family Medicine | Admitting: Family Medicine

## 2019-09-23 ENCOUNTER — Other Ambulatory Visit: Payer: Self-pay

## 2019-09-23 DIAGNOSIS — Z1231 Encounter for screening mammogram for malignant neoplasm of breast: Secondary | ICD-10-CM | POA: Diagnosis not present

## 2019-11-12 ENCOUNTER — Ambulatory Visit: Payer: Medicare Other

## 2019-11-15 DIAGNOSIS — E119 Type 2 diabetes mellitus without complications: Secondary | ICD-10-CM | POA: Diagnosis not present

## 2019-11-21 ENCOUNTER — Ambulatory Visit: Payer: Medicare Other | Attending: Internal Medicine

## 2019-11-21 DIAGNOSIS — Z23 Encounter for immunization: Secondary | ICD-10-CM | POA: Insufficient documentation

## 2019-11-21 NOTE — Progress Notes (Signed)
   Covid-19 Vaccination Clinic  Name:  Yolanda Morris    MRN: [id number] DOB: 03-Mar-1946  11/21/2019  [name] was observed post Covid-19 immunization for 15 minutes without incidence. She was provided with Vaccine Information Sheet and instruction to access the V-Safe system.   Ms. Patch was instructed to call 911 with any severe reactions post vaccine: Marland Kitchen Difficulty breathing  . Swelling of your face and throat  . A fast heartbeat  . A bad rash all over your body  . Dizziness and weakness    Immunizations Administered    Name Date Dose VIS Date Route   Pfizer COVID-19 Vaccine 11/21/2019  1:42 PM 0.3 mL 09/27/2019 Intramuscular   Manufacturer: [place]   Lot: [id number]   [place]: [id number]

## 2019-11-29 ENCOUNTER — Ambulatory Visit: Payer: Medicare Other

## 2019-12-04 DIAGNOSIS — E119 Type 2 diabetes mellitus without complications: Secondary | ICD-10-CM | POA: Diagnosis not present

## 2019-12-04 DIAGNOSIS — E1169 Type 2 diabetes mellitus with other specified complication: Secondary | ICD-10-CM | POA: Diagnosis not present

## 2019-12-04 DIAGNOSIS — M810 Age-related osteoporosis without current pathological fracture: Secondary | ICD-10-CM | POA: Diagnosis not present

## 2019-12-04 DIAGNOSIS — Z7984 Long term (current) use of oral hypoglycemic drugs: Secondary | ICD-10-CM | POA: Diagnosis not present

## 2019-12-04 DIAGNOSIS — I10 Essential (primary) hypertension: Secondary | ICD-10-CM | POA: Diagnosis not present

## 2019-12-04 DIAGNOSIS — E785 Hyperlipidemia, unspecified: Secondary | ICD-10-CM | POA: Diagnosis not present

## 2019-12-04 DIAGNOSIS — J449 Chronic obstructive pulmonary disease, unspecified: Secondary | ICD-10-CM | POA: Diagnosis not present

## 2019-12-13 DIAGNOSIS — E119 Type 2 diabetes mellitus without complications: Secondary | ICD-10-CM | POA: Diagnosis not present

## 2019-12-17 ENCOUNTER — Ambulatory Visit: Payer: Medicare Other | Attending: Internal Medicine

## 2019-12-17 DIAGNOSIS — Z23 Encounter for immunization: Secondary | ICD-10-CM | POA: Insufficient documentation

## 2019-12-17 NOTE — Progress Notes (Signed)
   Covid-19 Vaccination Clinic  Name:  Yolanda Morris    MRN: [id number] DOB: 1945/12/19  12/17/2019  Yolanda Morris was observed post Covid-19 immunization for 15 minutes without incident. She was provided with Vaccine Information Sheet and instruction to access the V-Safe system.   Yolanda Morris was instructed to call 911 with any severe reactions post vaccine: Marland Kitchen Difficulty breathing  . Swelling of face and throat  . A fast heartbeat  . A bad rash all over body  . Dizziness and weakness   Immunizations Administered    Name Date Dose VIS Date Route   Pfizer COVID-19 Vaccine 12/17/2019  8:29 AM 0.3 mL 09/27/2019 Intramuscular   Manufacturer: [place]   Lot: [id number]   [place]: [id number]

## 2020-01-15 DIAGNOSIS — E119 Type 2 diabetes mellitus without complications: Secondary | ICD-10-CM | POA: Diagnosis not present

## 2020-01-15 DIAGNOSIS — Z7984 Long term (current) use of oral hypoglycemic drugs: Secondary | ICD-10-CM | POA: Diagnosis not present

## 2020-01-21 DIAGNOSIS — M810 Age-related osteoporosis without current pathological fracture: Secondary | ICD-10-CM | POA: Diagnosis not present

## 2020-01-22 DIAGNOSIS — M810 Age-related osteoporosis without current pathological fracture: Secondary | ICD-10-CM | POA: Diagnosis not present

## 2020-01-29 DIAGNOSIS — E785 Hyperlipidemia, unspecified: Secondary | ICD-10-CM | POA: Diagnosis not present

## 2020-01-29 DIAGNOSIS — E1169 Type 2 diabetes mellitus with other specified complication: Secondary | ICD-10-CM | POA: Diagnosis not present

## 2020-01-29 DIAGNOSIS — M810 Age-related osteoporosis without current pathological fracture: Secondary | ICD-10-CM | POA: Diagnosis not present

## 2020-01-29 DIAGNOSIS — Z1389 Encounter for screening for other disorder: Secondary | ICD-10-CM | POA: Diagnosis not present

## 2020-01-29 DIAGNOSIS — Z0000 Encounter for general adult medical examination without abnormal findings: Secondary | ICD-10-CM | POA: Diagnosis not present

## 2020-01-29 DIAGNOSIS — J449 Chronic obstructive pulmonary disease, unspecified: Secondary | ICD-10-CM | POA: Diagnosis not present

## 2020-01-29 DIAGNOSIS — I10 Essential (primary) hypertension: Secondary | ICD-10-CM | POA: Diagnosis not present

## 2020-01-29 DIAGNOSIS — B009 Herpesviral infection, unspecified: Secondary | ICD-10-CM | POA: Diagnosis not present

## 2020-02-14 DIAGNOSIS — E119 Type 2 diabetes mellitus without complications: Secondary | ICD-10-CM | POA: Diagnosis not present

## 2020-03-02 ENCOUNTER — Encounter: Payer: Self-pay | Admitting: Critical Care Medicine

## 2020-03-02 ENCOUNTER — Other Ambulatory Visit: Payer: Self-pay

## 2020-03-02 ENCOUNTER — Ambulatory Visit (INDEPENDENT_AMBULATORY_CARE_PROVIDER_SITE_OTHER): Payer: Medicare Other | Admitting: Critical Care Medicine

## 2020-03-02 VITALS — BP 122/64 | HR 84 | Temp 97.2°F | Ht 63.0 in | Wt 161.8 lb

## 2020-03-02 DIAGNOSIS — J9611 Chronic respiratory failure with hypoxia: Secondary | ICD-10-CM

## 2020-03-02 DIAGNOSIS — J449 Chronic obstructive pulmonary disease, unspecified: Secondary | ICD-10-CM | POA: Diagnosis not present

## 2020-03-02 MED ORDER — BREZTRI AEROSPHERE 160-9-4.8 MCG/ACT IN AERO: 2.0000 | INHALATION_SPRAY | Freq: Two times a day (BID) | RESPIRATORY_TRACT | 0 refills | Status: DC

## 2020-03-02 NOTE — Progress Notes (Signed)
Synopsis: Referred in March 2012 for COPD by [name], MD.  Previously patient of Dr. Normajean Baxter and Dr. Lake Bells.  Subjective:   PATIENT ID: Yolanda Morris GENDER: female DOB: 1946-08-23, MRN: [id number]  Chief Complaint  Patient presents with  . Follow-up    SOB unchanged/ dry cough increased in last 2 weeks/ 2L of O2 at night     Ms. Stobaugh is a 74 year old woman with a history of COPD and chronic hypoxic respiratory failure requiring nocturnal oxygen who presents for follow-up.  Her symptoms have been stable and she likes Anoro better than the previous medications she was prescribed (Stiolto, Perforomist and Yupelri nebs).  Her symptoms are always worse in the afternoon and evening, mostly dyspnea on exertion.  She uses albuterol less frequently and notices that her symptoms are improved with just resting.  She has not used her nebulizer since her last visit.  She is able to walk her dog in the morning, including her pills, but walking the same distance and afternoons and evenings is bothersome and she is not sure why.  She has had a cough for the past few weeks after gardening, but not chronically and associated postnasal drip and nasal congestion.  She is up-to-date on her Covid vaccine.    OV 09/09/2019: Yolanda Morris is a 74 year old woman who presents for follow-up of COPD and chronic hypoxic respiratory failure.  She was recently treated with prednisone and azithromycin in October for exacerbation.  Her previous exacerbations of this have been in the winter 2019-2020.  She has 1-2 exacerbations per year.  She continues to use Anoro every day and never misses doses.  She has been using her albuterol less often.  She short of breath, she stops to rest.  She has not been wheezing, coughing, or having sputum production, which is her baseline.  She has been walking on the treadmill, about 30 minutes/day.  She takes about 2 breaks during this.  She has completed pulmonary rehab twice in the  past, once earlier this year.  In August 2020 she was started on home oxygen, 2 L with exertion and sleep.  She notices that her sleep is improved and she is less tired with using nighttime oxygen.  She does not use her oxygen all the time at home, but does check her saturations when she is feeling significantly dyspneic, reporting saturations as low as 85% when she is on room air.  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  She is a former smoker who quit 1998 after 50 pack years of smoking.  She is up-to-date on seasonal flu and pneumonia vaccines.    Past Medical History:  Diagnosis Date  . Cancer ([place])    skin  . COPD (chronic obstructive pulmonary disease) ([place])   . DM (diabetes mellitus) ([place])   . Dyspnea    increased activity   . Genital herpes   . Hyperlipidemia   . Migraines   . Osteoporosis   . Unspecified essential hypertension      Family History  Problem Relation Age of Onset  . COPD Mother        deceased  . Heart disease Mother   . Alcohol abuse Brother        deceased  . Alcohol abuse Sister        deceased  . Alcohol abuse Father      Past Surgical History:  Procedure Laterality Date  . BREAST LUMPECTOMY     benign  .  COLON SURGERY     removal of polyps  . DENTAL SURGERY    . RETINAL TEAR REPAIR CRYOTHERAPY      Social History   Socioeconomic History  . Marital status: Married    Spouse name: Not on file  . Number of children: Not on file  . Years of education: Not on file  . Highest education level: Not on file  Occupational History  . Occupation: Development worker, community  Tobacco Use  . Smoking status: Former Smoker    Packs/day: 2.00    Years: 25.00    Pack years: 50.00    Types: Cigarettes    Quit date: 10/17/1996    Years since quitting: 23.3  . Smokeless tobacco: Never Used  Substance and Sexual Activity  . Alcohol use: No    Alcohol/week: 0.0 standard drinks  . Drug use: No  . Sexual activity: Not on file  Other  Topics Concern  . Not on file  Social History Narrative   No children   Social Determinants of Health   Financial Resource Strain:   . Difficulty of Paying Living Expenses:   Food Insecurity:   . Worried About Charity fundraiser in the Last Year:   . Arboriculturist in the Last Year:   Transportation Needs:   . Film/video editor (Medical):   Marland Kitchen Lack of Transportation (Non-Medical):   Physical Activity:   . Days of Exercise per Week:   . Minutes of Exercise per Session:   Stress:   . Feeling of Stress :   Social Connections:   . Frequency of Communication with Friends and Family:   . Frequency of Social Gatherings with Friends and Family:   . Attends Religious Services:   . Active Member of Clubs or Organizations:   . Attends Archivist Meetings:   Marland Kitchen Marital Status:   Intimate Partner Violence:   . Fear of Current or Ex-Partner:   . Emotionally Abused:   Marland Kitchen Physically Abused:   . Sexually Abused:      Allergies  Allergen Reactions  . Bacitracin     rash  . Levofloxacin     hives     Immunization History  Administered Date(s) Administered  . Fluad Quad(high Dose 65+) 06/17/2019  . Influenza Split 06/18/2011, 07/10/2012, 08/17/2013  . Influenza, High Dose Seasonal PF 07/12/2017  . Influenza,inj,Quad PF,6+ Mos 07/07/2015, 07/11/2016  . Influenza-Unspecified 07/16/2014, 07/16/2018  . PFIZER SARS-COV-2 Vaccination 11/21/2019, 12/17/2019  . Pneumococcal Conjugate-13 12/18/2013  . Pneumococcal Polysaccharide-23 07/10/2012    Outpatient Medications Prior to Visit  Medication Sig Dispense Refill  . acetaminophen (TYLENOL) 325 MG tablet Take 650 mg by mouth every 6 (six) hours as needed.    Marland Kitchen aspirin EC 81 MG EC tablet Take 81 mg by mouth daily.      . calcium carbonate (OS-CAL - DOSED IN MG OF ELEMENTAL CALCIUM) 1250 (500 Ca) MG tablet Take 1 tablet by mouth daily with breakfast.    . cetirizine (ZYRTEC) 10 MG tablet Take 10 mg by mouth daily.    .  chlorpheniramine (CHLOR-TRIMETON) 4 MG tablet Take 4 mg by mouth daily as needed for allergies.    . Cholecalciferol (VITAMIN D3) 1000 UNITS CAPS Take 1 capsule by mouth daily.      . Cranberry 500 MG TABS Take 1 tablet by mouth daily.    Marland Kitchen losartan (COZAAR) 100 MG tablet Take 100 mg by mouth daily.     Marland Kitchen Lysine 500  MG CAPS Take 1 capsule by mouth daily.      . metFORMIN (GLUCOPHAGE-XR) 500 MG 24 hr tablet 500 mg in the morning and at bedtime.     . Multiple Vitamins-Minerals (CENTRUM SILVER PO) Take 1 tablet by mouth daily.      Marland Kitchen omeprazole (PRILOSEC) 20 MG capsule Take 20 mg by mouth every other day.     . simvastatin (ZOCOR) 40 MG tablet Take 40 mg by mouth at bedtime.      Marland Kitchen Spacer/Aero-Holding Chambers (AEROCHAMBER Z-STAT PLUS) inhaler Use as instructed 1 each 0  . umeclidinium-vilanterol (ANORO ELLIPTA) 62.5-25 MCG/INH AEPB Inhale 1 puff into the lungs daily. 60 each 11  . valACYclovir (VALTREX) 500 MG tablet Take 500 mg by mouth 2 (two) times daily as needed.      . Zoledronic Acid (RECLAST IV) Inject into the vein. Every 12 months    . albuterol (PROVENTIL HFA;VENTOLIN HFA) 108 (90 Base) MCG/ACT inhaler Inhale 2 puffs into the lungs every 6 (six) hours as needed for wheezing or shortness of breath. (Patient not taking: Reported on 03/02/2020) 3 Inhaler 3  . albuterol (PROVENTIL) (2.5 MG/3ML) 0.083% nebulizer solution Take 3 mL (2.5 MG/3ML) by nebulization every 6 hours as directed for sob and wheezing. (Patient not taking: Reported on 03/02/2020) 360 mL 3  . albuterol (VENTOLIN HFA) 108 (90 Base) MCG/ACT inhaler Inhale 2 puffs into the lungs every 6 (six) hours as needed for wheezing or shortness of breath. (Patient not taking: Reported on 03/02/2020) 6.7 g 11  . umeclidinium-vilanterol (ANORO ELLIPTA) 62.5-25 MCG/INH AEPB Inhale 1 puff into the lungs daily. 60 each 0   No facility-administered medications prior to visit.    Review of Systems  Constitutional: Negative for chills, fever  and weight loss.  HENT: Negative.   Respiratory: Negative for cough, sputum production and wheezing.        DOE at baseline  Cardiovascular: Negative for chest pain and leg swelling.  Gastrointestinal: Negative.   Genitourinary: Negative.   Musculoskeletal: Negative.   Skin: Negative for rash.     Objective:   Vitals:   03/02/20 0954  BP: 122/64  Pulse: 84  Temp: (!) 97.2 F (36.2 C)  TempSrc: Temporal  SpO2: 96%  Weight: 161 lb 12.8 oz (73.4 kg)  Height: 5\' 3"  (1.6 m)   96% on   RA BMI Readings from Last 3 Encounters:  03/02/20 28.66 kg/m  09/09/19 30.36 kg/m  06/17/19 29.45 kg/m   Wt Readings from Last 3 Encounters:  03/02/20 161 lb 12.8 oz (73.4 kg)  09/09/19 166 lb (75.3 kg)  06/17/19 161 lb (73 kg)    Physical Exam Vitals reviewed.  Constitutional:      Appearance: Normal appearance. She is not ill-appearing.  HENT:     Head: Normocephalic and atraumatic.  Eyes:     General: No scleral icterus. Cardiovascular:     Rate and Rhythm: Normal rate and regular rhythm.     Heart sounds: No murmur.  Pulmonary:     Comments: Barrel chested, breathing comfortably on room air without conversational dyspnea.  Distant breath sounds, but clear to auscultation bilaterally. Abdominal:     General: There is no distension.     Palpations: Abdomen is soft.  Musculoskeletal:        General: No swelling or deformity.     Cervical back: Neck supple.  Lymphadenopathy:     Cervical: No cervical adenopathy.  Skin:    General: Skin is warm and  dry.     Findings: No rash.  Neurological:     Mental Status: She is alert.     Coordination: Coordination normal.  Psychiatric:        Mood and Affect: Mood normal.        Behavior: Behavior normal.       Alpha-1 antitrypsin 117  Covid negative 07/23/2019 & 09/06/2019   Chest Imaging- films reviewed: CXR, 2 view 10/23/2018-basilar scarring bilaterally, increased AP diameter, kyphosis.  Grossly unchanged from 08/15/2018.   Pulmonary Functions Testing Results: PFT Results Latest Ref Rng & Units 09/09/2019  FVC-Pre L 1.69  FVC-Predicted Pre % 63  FVC-Post L 1.77  FVC-Predicted Post % 66  Pre FEV1/FVC % % 58  Post FEV1/FCV % % 57  FEV1-Pre L 0.99  FEV1-Predicted Pre % 49  FEV1-Post L 1.00  DLCO UNC% % 92  DLCO COR %Predicted % 115  TLC L 5.98  TLC % Predicted % 125  RV % Predicted % 185   2020- Severe obstruction with hyperinflation and air trapping.  No diffusion impairment.  Spirometry: PFT's 2011:  FEV1 1.25 (54%), ratio 52, +airtrapping, DLCO 77% [name] 06/2012:  FEV1 1.00 (43%), ratio 51% Spirometry 07/2018: FEV1 0.8 L 38% predicted Spirometry 08/15/2018- FVC 1.5 (56%), ratio 52, FEV1 39      Assessment & Plan:     ICD-10-CM   1. Chronic respiratory failure with hypoxia (HCC)  J96.11   2. Chronic obstructive pulmonary disease, unspecified COPD type ([place])  J44.9     COPD- GOLD group B -Up-to-date on seasonal flu, Covid, and pneumonia vaccines. -Trial of Breztri twice daily-sample provided.  If there is no improvement in her symptoms she should resume Anoro once daily or we can try Trelegy once daily.  -Continue albuterol as needed for symptoms. -She has been educated to contact us early if she has a change in symptoms such as increased need for albuterol or DuoNebs, increased cough, sputum production, wheezing. -Continue regular physical activity to maintain exercise tolerance. -Up-to-date on pneumonia Covid, and flu vaccines -Quit smoking greater than 20 years ago; not a candidate for lung cancer screening  Chronic hypoxic respiratory failure on 2 L home oxygen with exertion and sleep -Continue supplemental oxygen as prescribed   RTC in 6 months.    Current Outpatient Medications:  .  acetaminophen (TYLENOL) 325 MG tablet, Take 650 mg by mouth every 6 (six) hours as needed., Disp: , Rfl:  .  aspirin EC 81 MG EC tablet, Take 81 mg by mouth daily.  , Disp: , Rfl:  .  calcium  carbonate (OS-CAL - DOSED IN MG OF ELEMENTAL CALCIUM) 1250 (500 Ca) MG tablet, Take 1 tablet by mouth daily with breakfast., Disp: , Rfl:  .  cetirizine (ZYRTEC) 10 MG tablet, Take 10 mg by mouth daily., Disp: , Rfl:  .  chlorpheniramine (CHLOR-TRIMETON) 4 MG tablet, Take 4 mg by mouth daily as needed for allergies., Disp: , Rfl:  .  Cholecalciferol (VITAMIN D3) 1000 UNITS CAPS, Take 1 capsule by mouth daily.  , Disp: , Rfl:  .  Cranberry 500 MG TABS, Take 1 tablet by mouth daily., Disp: , Rfl:  .  losartan (COZAAR) 100 MG tablet, Take 100 mg by mouth daily. , Disp: , Rfl:  .  Lysine 500 MG CAPS, Take 1 capsule by mouth daily.  , Disp: , Rfl:  .  metFORMIN (GLUCOPHAGE-XR) 500 MG 24 hr tablet, 500 mg in the morning and at bedtime. , Disp: ,  Rfl:  .  Multiple Vitamins-Minerals (CENTRUM SILVER PO), Take 1 tablet by mouth daily.  , Disp: , Rfl:  .  omeprazole (PRILOSEC) 20 MG capsule, Take 20 mg by mouth every other day. , Disp: , Rfl:  .  simvastatin (ZOCOR) 40 MG tablet, Take 40 mg by mouth at bedtime.  , Disp: , Rfl:  .  Spacer/Aero-Holding Chambers (AEROCHAMBER Z-STAT PLUS) inhaler, Use as instructed, Disp: 1 each, Rfl: 0 .  umeclidinium-vilanterol (ANORO ELLIPTA) 62.5-25 MCG/INH AEPB, Inhale 1 puff into the lungs daily., Disp: 60 each, Rfl: 11 .  valACYclovir (VALTREX) 500 MG tablet, Take 500 mg by mouth 2 (two) times daily as needed.  , Disp: , Rfl:  .  Zoledronic Acid (RECLAST IV), Inject into the vein. Every 12 months, Disp: , Rfl:  .  albuterol (PROVENTIL HFA;VENTOLIN HFA) 108 (90 Base) MCG/ACT inhaler, Inhale 2 puffs into the lungs every 6 (six) hours as needed for wheezing or shortness of breath. (Patient not taking: Reported on 03/02/2020), Disp: 3 Inhaler, Rfl: 3 .  albuterol (PROVENTIL) (2.5 MG/3ML) 0.083% nebulizer solution, Take 3 mL (2.5 MG/3ML) by nebulization every 6 hours as directed for sob and wheezing. (Patient not taking: Reported on 03/02/2020), Disp: 360 mL, Rfl: 3 .  albuterol  (VENTOLIN HFA) 108 (90 Base) MCG/ACT inhaler, Inhale 2 puffs into the lungs every 6 (six) hours as needed for wheezing or shortness of breath. (Patient not taking: Reported on 03/02/2020), Disp: 6.7 g, Rfl: 11 .  umeclidinium-vilanterol (ANORO ELLIPTA) 62.5-25 MCG/INH AEPB, Inhale 1 puff into the lungs daily., Disp: 60 each, Rfl: 0   Julian Hy, DO [facility] 03/02/2020 10:22 AM

## 2020-03-02 NOTE — Patient Instructions (Addendum)
Thank you for visiting Dr. Carlis Abbott at [facility]. We recommend the following:  Try Bevespi 2 puffs twice per day until sample runs out. If your symptoms are better, let us know and we will change the prescription. Do not take Anoro while you are taking Bevespi.   Return in about 6 months (around 09/02/2020).    Please do your part to reduce the spread of COVID-19.

## 2020-03-12 ENCOUNTER — Other Ambulatory Visit: Payer: Self-pay | Admitting: Pulmonary Disease

## 2020-03-12 DIAGNOSIS — J432 Centrilobular emphysema: Secondary | ICD-10-CM

## 2020-03-12 DIAGNOSIS — E119 Type 2 diabetes mellitus without complications: Secondary | ICD-10-CM | POA: Diagnosis not present

## 2020-03-12 DIAGNOSIS — J410 Simple chronic bronchitis: Secondary | ICD-10-CM

## 2020-03-13 ENCOUNTER — Other Ambulatory Visit: Payer: Self-pay | Admitting: Critical Care Medicine

## 2020-03-17 ENCOUNTER — Telehealth: Payer: Self-pay | Admitting: Critical Care Medicine

## 2020-03-17 NOTE — Telephone Encounter (Signed)
Previous encounter open on this patient routed to BQ Refill sent for Dr. Carlis Abbott as this is who the patient saw last and was to continue albuterol

## 2020-03-25 DIAGNOSIS — H2513 Age-related nuclear cataract, bilateral: Secondary | ICD-10-CM | POA: Diagnosis not present

## 2020-03-25 DIAGNOSIS — E119 Type 2 diabetes mellitus without complications: Secondary | ICD-10-CM | POA: Diagnosis not present

## 2020-03-26 ENCOUNTER — Other Ambulatory Visit: Payer: Self-pay

## 2020-03-26 ENCOUNTER — Encounter: Payer: Self-pay | Admitting: Critical Care Medicine

## 2020-03-26 ENCOUNTER — Ambulatory Visit (INDEPENDENT_AMBULATORY_CARE_PROVIDER_SITE_OTHER): Payer: Medicare Other | Admitting: Critical Care Medicine

## 2020-03-26 VITALS — BP 120/72 | HR 96 | Temp 98.1°F | Ht 63.0 in | Wt 159.8 lb

## 2020-03-26 DIAGNOSIS — J449 Chronic obstructive pulmonary disease, unspecified: Secondary | ICD-10-CM | POA: Diagnosis not present

## 2020-03-26 DIAGNOSIS — J9611 Chronic respiratory failure with hypoxia: Secondary | ICD-10-CM | POA: Diagnosis not present

## 2020-03-26 NOTE — Progress Notes (Signed)
Synopsis: Referred in March 2012 for COPD by [name], MD.  Previously patient of Dr. Normajean Baxter and Dr. Lake Bells.  Subjective:   PATIENT ID: Yolanda Morris GENDER: female DOB: Jul 21, 1946, MRN: [id number]  Chief Complaint  Patient presents with  . Follow-up    Pt on 2L at night and on treadmill. Shortness of breath witih exertion and ADL's, worse in the afternoons    Ms. Demeritt is a 74 y/o woman with a history of COPD and chronic hypoxic respiratory failure who presents for follow up.  Her insurance company is requiring oxygen recertification.  She has a Marine scientist which she did not bring with her today.  When she entered the room her saturations were 87%.  She continues to use Anoro once daily.  She has not yet started using nebulizers twice daily to see if this improves her symptoms later in the day.  She wanted to try switching to [place], but her insurance company did not cover it and the cost was prohibitive.  Her symptoms are worse on humid days and later in the day.  Her breathing is currently at baseline.  No wheezing.  She has returned to grocery shopping and person and going to church, but everyone at her church is required to wear masks.    OV 03/02/20: Ms. Hakanson is a 74 year old woman with a history of COPD and chronic hypoxic respiratory failure requiring nocturnal oxygen who presents for follow-up.  Her symptoms have been stable and she likes Anoro better than the previous medications she was prescribed (Stiolto, Perforomist and Yupelri nebs).  Her symptoms are always worse in the afternoon and evening, mostly dyspnea on exertion.  She uses albuterol less frequently and notices that her symptoms are improved with just resting.  She has not used her nebulizer since her last visit.  She is able to walk her dog in the morning, including her pills, but walking the same distance and afternoons and evenings is bothersome and she is not sure why.  She has had a cough for the  past few weeks after gardening, but not chronically and associated postnasal drip and nasal congestion.  She is up-to-date on her Covid vaccine.   OV 09/09/2019: Mrs. Hilscher is a 74 year old woman who presents for follow-up of COPD and chronic hypoxic respiratory failure.  She was recently treated with prednisone and azithromycin in October for exacerbation.  Her previous exacerbations of this have been in the winter 2019-2020.  She has 1-2 exacerbations per year.  She continues to use Anoro every day and never misses doses.  She has been using her albuterol less often.  She short of breath, she stops to rest.  She has not been wheezing, coughing, or having sputum production, which is her baseline.  She has been walking on the treadmill, about 30 minutes/day.  She takes about 2 breaks during this.  She has completed pulmonary rehab twice in the past, once earlier this year.  In August 2020 she was started on home oxygen, 2 L with exertion and sleep.  She notices that her sleep is improved and she is less tired with using nighttime oxygen.  She does not use her oxygen all the time at home, but does check her saturations when she is feeling significantly dyspneic, reporting saturations as low as 85% when she is on room air.  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  She is a former smoker who quit 1998 after 50 pack years of smoking.  She is up-to-date on seasonal flu and pneumonia vaccines.    Past Medical History:  Diagnosis Date  . Cancer ([place])    skin  . COPD (chronic obstructive pulmonary disease) ([place])   . DM (diabetes mellitus) ([place])   . Dyspnea    increased activity   . Genital herpes   . Hyperlipidemia   . Migraines   . Osteoporosis   . Unspecified essential hypertension      Family History  Problem Relation Age of Onset  . COPD Mother        deceased  . Heart disease Mother   . Alcohol abuse Brother        deceased  . Alcohol abuse Sister        deceased  .  Alcohol abuse Father      Past Surgical History:  Procedure Laterality Date  . BREAST LUMPECTOMY     benign  . COLON SURGERY     removal of polyps  . DENTAL SURGERY    . RETINAL TEAR REPAIR CRYOTHERAPY      Social History   Socioeconomic History  . Marital status: Married    Spouse name: Not on file  . Number of children: Not on file  . Years of education: Not on file  . Highest education level: Not on file  Occupational History  . Occupation: Development worker, community  Tobacco Use  . Smoking status: Former Smoker    Packs/day: 2.00    Years: 25.00    Pack years: 50.00    Types: Cigarettes    Quit date: 10/17/1996    Years since quitting: 23.4  . Smokeless tobacco: Never Used  Vaping Use  . Vaping Use: Never used  Substance and Sexual Activity  . Alcohol use: No    Alcohol/week: 0.0 standard drinks  . Drug use: No  . Sexual activity: Not on file  Other Topics Concern  . Not on file  Social History Narrative   No children   Social Determinants of Health   Financial Resource Strain:   . Difficulty of Paying Living Expenses:   Food Insecurity:   . Worried About Charity fundraiser in the Last Year:   . Arboriculturist in the Last Year:   Transportation Needs:   . Film/video editor (Medical):   Marland Kitchen Lack of Transportation (Non-Medical):   Physical Activity:   . Days of Exercise per Week:   . Minutes of Exercise per Session:   Stress:   . Feeling of Stress :   Social Connections:   . Frequency of Communication with Friends and Family:   . Frequency of Social Gatherings with Friends and Family:   . Attends Religious Services:   . Active Member of Clubs or Organizations:   . Attends Archivist Meetings:   Marland Kitchen Marital Status:   Intimate Partner Violence:   . Fear of Current or Ex-Partner:   . Emotionally Abused:   Marland Kitchen Physically Abused:   . Sexually Abused:      Allergies  Allergen Reactions  . Bacitracin     rash  . Levofloxacin     hives       Immunization History  Administered Date(s) Administered  . Fluad Quad(high Dose 65+) 06/17/2019  . Influenza Split 06/18/2011, 07/10/2012, 08/17/2013  . Influenza, High Dose Seasonal PF 07/12/2017  . Influenza,inj,Quad PF,6+ Mos 07/07/2015, 07/11/2016  . Influenza-Unspecified 07/16/2014, 07/16/2018  . PFIZER SARS-COV-2 Vaccination 11/21/2019, 12/17/2019  . Pneumococcal Conjugate-13 12/18/2013  .  Pneumococcal Polysaccharide-23 07/10/2012    Outpatient Medications Prior to Visit  Medication Sig Dispense Refill  . acetaminophen (TYLENOL) 325 MG tablet Take 650 mg by mouth every 6 (six) hours as needed.    Marland Kitchen albuterol (PROVENTIL HFA;VENTOLIN HFA) 108 (90 Base) MCG/ACT inhaler Inhale 2 puffs into the lungs every 6 (six) hours as needed for wheezing or shortness of breath. (Patient not taking: Reported on 03/02/2020) 3 Inhaler 3  . albuterol (PROVENTIL) (2.5 MG/3ML) 0.083% nebulizer solution USE 1 VIAL IN NEBULIZER EVERY 6 HOURS AS DIRECTED FOR SHORTNESS OF BREATH/WHEEZING Generic: VENTOLIN 360 mL 10  . albuterol (VENTOLIN HFA) 108 (90 Base) MCG/ACT inhaler Inhale 2 puffs into the lungs every 6 (six) hours as needed for wheezing or shortness of breath. (Patient not taking: Reported on 03/02/2020) 6.7 g 11  . aspirin EC 81 MG EC tablet Take 81 mg by mouth daily.      . calcium carbonate (OS-CAL - DOSED IN MG OF ELEMENTAL CALCIUM) 1250 (500 Ca) MG tablet Take 1 tablet by mouth daily with breakfast.    . cetirizine (ZYRTEC) 10 MG tablet Take 10 mg by mouth daily.    . chlorpheniramine (CHLOR-TRIMETON) 4 MG tablet Take 4 mg by mouth daily as needed for allergies.    . Cholecalciferol (VITAMIN D3) 1000 UNITS CAPS Take 1 capsule by mouth daily.      . Cranberry 500 MG TABS Take 1 tablet by mouth daily.    Marland Kitchen losartan (COZAAR) 100 MG tablet Take 100 mg by mouth daily.     Marland Kitchen Lysine 500 MG CAPS Take 1 capsule by mouth daily.      . metFORMIN (GLUCOPHAGE-XR) 500 MG 24 hr tablet 500 mg in the morning  and at bedtime.     . Multiple Vitamins-Minerals (CENTRUM SILVER PO) Take 1 tablet by mouth daily.      Marland Kitchen omeprazole (PRILOSEC) 20 MG capsule Take 20 mg by mouth every other day.     . simvastatin (ZOCOR) 40 MG tablet Take 40 mg by mouth at bedtime.      Marland Kitchen Spacer/Aero-Holding Chambers (AEROCHAMBER Z-STAT PLUS) inhaler Use as instructed 1 each 0  . umeclidinium-vilanterol (ANORO ELLIPTA) 62.5-25 MCG/INH AEPB Inhale 1 puff into the lungs daily. 60 each 11  . umeclidinium-vilanterol (ANORO ELLIPTA) 62.5-25 MCG/INH AEPB Inhale 1 puff into the lungs daily. 60 each 0  . valACYclovir (VALTREX) 500 MG tablet Take 500 mg by mouth 2 (two) times daily as needed.      . Zoledronic Acid (RECLAST IV) Inject into the vein. Every 12 months    . Budeson-Glycopyrrol-Formoterol (BREZTRI AEROSPHERE) 160-9-4.8 MCG/ACT AERO Inhale 2 puffs into the lungs in the morning and at bedtime. 5.9 g 0   No facility-administered medications prior to visit.    Review of Systems  Constitutional: Negative for chills, fever and weight loss.  HENT: Negative.   Respiratory: Negative for cough, sputum production and wheezing.        DOE at baseline  Cardiovascular: Negative for chest pain and leg swelling.  Gastrointestinal: Negative.   Genitourinary: Negative.   Musculoskeletal: Negative.   Skin: Negative for rash.     Objective:   Vitals:   03/26/20 1428 03/26/20 1451  BP: 120/72   Pulse: 96   Temp: 98.1 F (36.7 C)   TempSrc: Oral   SpO2: (!) 87% 90%  Weight: 159 lb 12.8 oz (72.5 kg)   Height: 5\' 3"  (1.6 m)    90% on   RA BMI  Readings from Last 3 Encounters:  03/26/20 28.31 kg/m  03/02/20 28.66 kg/m  09/09/19 30.36 kg/m   Wt Readings from Last 3 Encounters:  03/26/20 159 lb 12.8 oz (72.5 kg)  03/02/20 161 lb 12.8 oz (73.4 kg)  09/09/19 166 lb (75.3 kg)    Physical Exam Vitals reviewed.  Constitutional:      General: She is not in acute distress.    Appearance: Normal appearance. She is not  ill-appearing.  HENT:     Head: Normocephalic and atraumatic.  Eyes:     General: No scleral icterus. Cardiovascular:     Rate and Rhythm: Normal rate and regular rhythm.     Heart sounds: No murmur heard.   Pulmonary:     Comments: Breathing comfortably on room air, no conversational dyspnea.  Clear to auscultation bilaterally, diminished breath sounds. Abdominal:     General: There is no distension.     Palpations: Abdomen is soft.     Tenderness: There is no abdominal tenderness.  Musculoskeletal:        General: No swelling or deformity.     Cervical back: Neck supple.  Lymphadenopathy:     Cervical: No cervical adenopathy.  Skin:    General: Skin is warm and dry.     Findings: No rash.  Neurological:     General: No focal deficit present.     Mental Status: She is alert.     Coordination: Coordination normal.  Psychiatric:        Mood and Affect: Mood normal.        Behavior: Behavior normal.       Alpha-1 antitrypsin 117  Covid negative 07/23/2019 & 09/06/2019   Chest Imaging- films reviewed: CXR, 2 view 10/23/2018-basilar scarring bilaterally, increased AP diameter, kyphosis.  Grossly unchanged from 08/15/2018.  Pulmonary Functions Testing Results: PFT Results Latest Ref Rng & Units 09/09/2019  FVC-Pre L 1.69  FVC-Predicted Pre % 63  FVC-Post L 1.77  FVC-Predicted Post % 66  Pre FEV1/FVC % % 58  Post FEV1/FCV % % 57  FEV1-Pre L 0.99  FEV1-Predicted Pre % 49  FEV1-Post L 1.00  DLCO UNC% % 92  DLCO COR %Predicted % 115  TLC L 5.98  TLC % Predicted % 125  RV % Predicted % 185   2020- Severe obstruction with hyperinflation and air trapping.  No diffusion impairment.  Spirometry: PFT's 2011:  FEV1 1.25 (54%), ratio 52, +airtrapping, DLCO 77% [name] 06/2012:  FEV1 1.00 (43%), ratio 51% Spirometry 07/2018: FEV1 0.8 L 38% predicted Spirometry 08/15/2018- FVC 1.5 (56%), ratio 52, FEV1 39      Assessment & Plan:     ICD-10-CM   1. Chronic obstructive  pulmonary disease, unspecified COPD type ([place])  J44.9   2. Chronic respiratory failure with hypoxia ([facility])  J96.11     Chronic hypoxic respiratory failure -Requalified for oxygen today -Strongly encouraged her to bring her oxygen whenever she leaves the house given exertional desaturations. I am worried that she is having more hypoxic episodes than she realizes.   COPD- GOLD group B -Up-to-date on seasonal flu, Covid, and pneumonia vaccines. -Continue Anoro once daily.  She will check with her insurance to determine if Trelegy is covered. -Continue albuterol as needed for symptoms.  Start using nebulizers twice daily to determine if this improves her symptoms in the afternoon. -Continue regular physical activity. -Quit smoking greater than 20 years ago; not a candidate for lung cancer screening   RTC in 4 months.  Current Outpatient Medications:  .  acetaminophen (TYLENOL) 325 MG tablet, Take 650 mg by mouth every 6 (six) hours as needed., Disp: , Rfl:  .  albuterol (PROVENTIL HFA;VENTOLIN HFA) 108 (90 Base) MCG/ACT inhaler, Inhale 2 puffs into the lungs every 6 (six) hours as needed for wheezing or shortness of breath. (Patient not taking: Reported on 03/02/2020), Disp: 3 Inhaler, Rfl: 3 .  albuterol (PROVENTIL) (2.5 MG/3ML) 0.083% nebulizer solution, USE 1 VIAL IN NEBULIZER EVERY 6 HOURS AS DIRECTED FOR SHORTNESS OF BREATH/WHEEZING Generic: VENTOLIN, Disp: 360 mL, Rfl: 10 .  albuterol (VENTOLIN HFA) 108 (90 Base) MCG/ACT inhaler, Inhale 2 puffs into the lungs every 6 (six) hours as needed for wheezing or shortness of breath. (Patient not taking: Reported on 03/02/2020), Disp: 6.7 g, Rfl: 11 .  aspirin EC 81 MG EC tablet, Take 81 mg by mouth daily.  , Disp: , Rfl:  .  calcium carbonate (OS-CAL - DOSED IN MG OF ELEMENTAL CALCIUM) 1250 (500 Ca) MG tablet, Take 1 tablet by mouth daily with breakfast., Disp: , Rfl:  .  cetirizine (ZYRTEC) 10 MG tablet, Take 10 mg by mouth daily., Disp: , Rfl:   .  chlorpheniramine (CHLOR-TRIMETON) 4 MG tablet, Take 4 mg by mouth daily as needed for allergies., Disp: , Rfl:  .  Cholecalciferol (VITAMIN D3) 1000 UNITS CAPS, Take 1 capsule by mouth daily.  , Disp: , Rfl:  .  Cranberry 500 MG TABS, Take 1 tablet by mouth daily., Disp: , Rfl:  .  losartan (COZAAR) 100 MG tablet, Take 100 mg by mouth daily. , Disp: , Rfl:  .  Lysine 500 MG CAPS, Take 1 capsule by mouth daily.  , Disp: , Rfl:  .  metFORMIN (GLUCOPHAGE-XR) 500 MG 24 hr tablet, 500 mg in the morning and at bedtime. , Disp: , Rfl:  .  Multiple Vitamins-Minerals (CENTRUM SILVER PO), Take 1 tablet by mouth daily.  , Disp: , Rfl:  .  omeprazole (PRILOSEC) 20 MG capsule, Take 20 mg by mouth every other day. , Disp: , Rfl:  .  simvastatin (ZOCOR) 40 MG tablet, Take 40 mg by mouth at bedtime.  , Disp: , Rfl:  .  Spacer/Aero-Holding Chambers (AEROCHAMBER Z-STAT PLUS) inhaler, Use as instructed, Disp: 1 each, Rfl: 0 .  umeclidinium-vilanterol (ANORO ELLIPTA) 62.5-25 MCG/INH AEPB, Inhale 1 puff into the lungs daily., Disp: 60 each, Rfl: 11 .  umeclidinium-vilanterol (ANORO ELLIPTA) 62.5-25 MCG/INH AEPB, Inhale 1 puff into the lungs daily., Disp: 60 each, Rfl: 0 .  valACYclovir (VALTREX) 500 MG tablet, Take 500 mg by mouth 2 (two) times daily as needed.  , Disp: , Rfl:  .  Zoledronic Acid (RECLAST IV), Inject into the vein. Every 12 months, Disp: , Rfl:    Julian Hy, DO [facility] 03/26/2020 4:42 PM

## 2020-03-26 NOTE — Patient Instructions (Addendum)
Thank you for visiting Dr. Carlis Abbott at [facility]. We recommend the following:  Keep taking Anoro once daily. Start using nebulizers twice per day. Ask if your insurance covers Trelegy (same brand as Anoro).  Use your oxygen when you are leaving the house.    Return in about 4 months (around 07/26/2020).    Please do your part to reduce the spread of COVID-19.

## 2020-05-04 DIAGNOSIS — J449 Chronic obstructive pulmonary disease, unspecified: Secondary | ICD-10-CM | POA: Diagnosis not present

## 2020-05-04 DIAGNOSIS — I10 Essential (primary) hypertension: Secondary | ICD-10-CM | POA: Diagnosis not present

## 2020-05-04 DIAGNOSIS — E785 Hyperlipidemia, unspecified: Secondary | ICD-10-CM | POA: Diagnosis not present

## 2020-05-04 DIAGNOSIS — M810 Age-related osteoporosis without current pathological fracture: Secondary | ICD-10-CM | POA: Diagnosis not present

## 2020-05-04 DIAGNOSIS — E119 Type 2 diabetes mellitus without complications: Secondary | ICD-10-CM | POA: Diagnosis not present

## 2020-05-04 DIAGNOSIS — E1169 Type 2 diabetes mellitus with other specified complication: Secondary | ICD-10-CM | POA: Diagnosis not present

## 2020-05-15 DIAGNOSIS — E1169 Type 2 diabetes mellitus with other specified complication: Secondary | ICD-10-CM | POA: Diagnosis not present

## 2020-05-21 DIAGNOSIS — M62838 Other muscle spasm: Secondary | ICD-10-CM | POA: Diagnosis not present

## 2020-05-21 DIAGNOSIS — Z7984 Long term (current) use of oral hypoglycemic drugs: Secondary | ICD-10-CM | POA: Diagnosis not present

## 2020-05-21 DIAGNOSIS — E1169 Type 2 diabetes mellitus with other specified complication: Secondary | ICD-10-CM | POA: Diagnosis not present

## 2020-05-21 DIAGNOSIS — M546 Pain in thoracic spine: Secondary | ICD-10-CM | POA: Diagnosis not present

## 2020-06-01 ENCOUNTER — Other Ambulatory Visit: Payer: Self-pay

## 2020-06-01 ENCOUNTER — Ambulatory Visit: Payer: Medicare Other | Attending: Family Medicine | Admitting: Physical Therapy

## 2020-06-01 ENCOUNTER — Encounter: Payer: Self-pay | Admitting: Physical Therapy

## 2020-06-01 DIAGNOSIS — R29898 Other symptoms and signs involving the musculoskeletal system: Secondary | ICD-10-CM | POA: Diagnosis not present

## 2020-06-01 DIAGNOSIS — R293 Abnormal posture: Secondary | ICD-10-CM | POA: Insufficient documentation

## 2020-06-01 DIAGNOSIS — M6281 Muscle weakness (generalized): Secondary | ICD-10-CM | POA: Insufficient documentation

## 2020-06-01 DIAGNOSIS — M546 Pain in thoracic spine: Secondary | ICD-10-CM | POA: Diagnosis not present

## 2020-06-01 NOTE — Therapy (Signed)
[facility] [place] [address]  [place], [place], [address] Phone: [phone]   Fax:  [phone]  Physical Therapy Evaluation  Patient Details  Name: Yolanda Morris MRN: [id number] Date of Birth: 11-24-1945 Referring Provider (PT): [name], MD   Encounter Date: 06/01/2020   PT End of Session - 06/01/20 0935    Visit Number 1    Number of Visits 12    Date for PT Re-Evaluation 07/13/20    Authorization Type Medicare & AARP    PT Start Time 0935    PT Stop Time 1027    PT Time Calculation (min) 52 min    Activity Tolerance Patient tolerated treatment well    Behavior During Therapy [facility] for tasks assessed/performed           Past Medical History:  Diagnosis Date  . Cancer ([place])    skin  . COPD (chronic obstructive pulmonary disease) ([place])   . DM (diabetes mellitus) ([place])   . Dyspnea    increased activity   . Genital herpes   . Hyperlipidemia   . Migraines   . Osteoporosis   . Unspecified essential hypertension     Past Surgical History:  Procedure Laterality Date  . BREAST LUMPECTOMY     benign  . COLON SURGERY     removal of polyps  . DENTAL SURGERY    . RETINAL TEAR REPAIR CRYOTHERAPY      There were no vitals filed for this visit.    Subjective Assessment - 06/01/20 0940    Subjective Pt reports h/o compression fractures and intermittent back pain. Back pain typically triggered by movement with most recent exacerbation secondary to lifting her little dog.    Pertinent History h/o multiple compression fracture (x2); COPD    Limitations Sitting;Standing;House hold activities;Lifting    How long can you sit comfortably? 1 hr    How long can you stand comfortably? 30 minutes    Patient Stated Goals "learn how to do strengthening exercises for my back so I can be as healthly as I can be"    Currently in Pain? Yes    Pain Score 1    1.5/10; briefly intermittent up to 6-7/10 at worst   Pain  Location Back   (lower thoracic)   Pain Orientation Mid    Pain Descriptors / Indicators Dull;Aching    Pain Type Acute pain;Chronic pain    Pain Radiating Towards wrapping around L side of ribs    Pain Onset 1 to 4 weeks ago   3 weeks   Pain Frequency Intermittent    Aggravating Factors  gardening - bending and turning    Pain Relieving Factors heating pad    Effect of Pain on Daily Activities difficuty with household cleaning - washing BR mirror, mopping & sweeping floors              [facility] PT Assessment - 06/01/20 0935      Assessment   Medical Diagnosis Mid (lower thoracic) back pain    Referring Provider (PT) [name], MD    Onset Date/Surgical Date --   ~3 weeks   Hand Dominance Right    Next MD Visit Oct 2021    Prior Therapy h/o PT for back ~2017      Precautions   Precautions Back    Precaution Comments osteoporsis with h/o vertebral compression fractures x 2      Balance Screen   Has the patient  fallen in the past 6 months No    Has the patient had a decrease in activity level because of a fear of falling?  Yes    Is the patient reluctant to leave their home because of a fear of falling?  No      Home Environment   Living Environment Private residence    Living Arrangements Alone    Type of [place] Access Level entry    [place] One level    Additional Comments has treadmill      Prior Function   Level of Independence Independent    Vocation Retired    Leisure visiting in neighborhood, reading, mostly sedentary      Cognition   Overall Cognitive Status Within Functional Limits for tasks assessed      Observation/Other Assessments   Focus on Therapeutic Outcomes (FOTO)  Thoracic - 43% (57% limitation); Predicted 55% (45% limitation)      Posture/Postural Control   Posture/Postural Control Postural limitations    Postural Limitations Forward head;Rounded Shoulders;Increased thoracic kyphosis;Decreased lumbar lordosis      ROM /  Strength   AROM / PROM / Strength AROM;Strength      AROM   Overall AROM Comments WFL except limited in R side bend & rotation    AROM Assessment Site Thoracic;Lumbar      Strength   Strength Assessment Site Hip;Knee;Ankle;Shoulder    Right/Left Shoulder Right;Left    Right Shoulder Flexion 4-/5    Right Shoulder ABduction 4-/5    Right Shoulder Internal Rotation 4/5    Right Shoulder External Rotation 4-/5    Left Shoulder Flexion 4-/5    Left Shoulder ABduction 4-/5    Left Shoulder Internal Rotation 4/5    Left Shoulder External Rotation 4-/5    Right/Left Hip Right;Left    Right Hip Flexion 4-/5    Right Hip Extension 4-/5    Right Hip External Rotation  4-/5    Right Hip Internal Rotation 4/5    Right Hip ABduction 4/5    Right Hip ADduction 4/5    Left Hip Flexion 4-/5    Left Hip Extension 4-/5    Left Hip External Rotation 4-/5    Left Hip Internal Rotation 4/5    Left Hip ABduction 4-/5    Left Hip ADduction 4-/5    Right/Left Knee Right;Left    Right Knee Flexion 4-/5    Right Knee Extension 4/5    Left Knee Flexion 4-/5    Left Knee Extension 4/5    Right/Left Ankle Right;Left    Right Ankle Dorsiflexion 4/5    Left Ankle Dorsiflexion 4/5      Flexibility   Soft Tissue Assessment /Muscle Length yes    Hamstrings mod tight    Quadriceps mod tight    ITB mod tight    Piriformis mod tight                      Objective measurements completed on examination: See above findings.                 PT Short Term Goals - 06/01/20 1027      PT SHORT TERM GOAL #1   Title Patient will be independent with initial HEP    Status New    Target Date 06/22/20      PT SHORT TERM GOAL #2   Title Patient will verbalize/demonstrate understanding of neutral spine posture  and proper body mechanics to reduce strain on thoracolumbar spine    Status New    Target Date 06/22/20             PT Long Term Goals - 06/01/20 1027      PT LONG  TERM GOAL #1   Title Patient will be independent with ongoing/advanced HEP    Status New    Target Date 07/13/20      PT LONG TERM GOAL #2   Title Patient to verbalize/demonstrate understanding of appropriate posture and body mechanics needed for daily activities to reduce risk for osteoporotic fractures    Status New    Target Date 07/13/20      PT LONG TERM GOAL #3   Title Patient to report pain reduction in frequency and intensity by >/= 50%    Status New    Target Date 07/13/20      PT LONG TERM GOAL #4   Title Patient to improve thoracolumbar AROM to [facility] without pain provocation    Status New    Target Date 07/13/20      PT LONG TERM GOAL #5   Title Patient to report ability to perform ADLs, household and yardwork-related tasks without increased pain    Status New    Target Date 07/13/20                  Plan - 06/01/20 1027    Clinical Impression Statement Yolanda Morris is a 74 y/o female who presents to OP PT for acute onset of lower thoracic back pain ~3 weeks ago after twisting wrong while lifting her dog from the floor. She reports a h/o low back pain with at least 2 vertebral compression fractures secondary to osteoporosis but denies any surgical intervention. Pain exacerbated by bending and turning such as when gardening or cleaning house. Deficits include postural dysfunction with a pronounced forward head and rounded shoulder posture with increased thoracic kyphosis, mildly decreased thoracolumbar ROM in R side bending and rotation, decreased proximal LE flexibility, increased muscle tension and ttp in lower thoracic paraspinals and periscapular muscles, as well as core and proximal UE/LE weakness. Yolanda Morris  will benefit from skilled PT to address above deficits to decrease pain to allow her to perform daily activities w/o without pain interference.    Personal Factors and Comorbidities Comorbidity 3+;Time since onset of injury/illness/exacerbation;Past/Current  Experience;Fitness;Age    Comorbidities COPD with intermittent need fro O2, HTN, osteoporosis s/p vertebral compression fractures x 2, migraines/tension headache, DM-II    Examination-Activity Limitations Bed Mobility;Bend;Lift;Carry;Caring for Others;Locomotion Level;Reach Overhead;Sit;Sleep;Squat;Stand;Transfers    Examination-Participation Restrictions Cleaning;Community Activity;Interpersonal Relationship;Meal Prep;Shop;Yard Work    [employer] Evolving/Moderate complexity    Clinical Decision Making Moderate    Rehab Potential Good    PT Frequency 2x / week    PT Duration 6 weeks    PT Treatment/Interventions ADLs/Self Care Home Management;Cryotherapy;Electrical Stimulation;Iontophoresis 4mg /ml Dexamethasone;Moist Heat;Ultrasound;Gait training;Functional mobility training;Therapeutic activities;Therapeutic exercise;Balance training;Neuromuscular re-education;Patient/family education;Manual techniques;Passive range of motion;Dry needling;Taping    PT Next Visit Plan Create initial HEP - postural flexibility and strengthening    Consulted and Agree with Plan of Care Patient           Patient will benefit from skilled therapeutic intervention in order to improve the following deficits and impairments:  Decreased activity tolerance, Decreased endurance, Decreased knowledge of precautions, Decreased mobility, Decreased range of motion, Decreased safety awareness, Decreased strength, Increased fascial restricitons, Increased muscle spasms, Impaired perceived functional ability, Impaired flexibility, Impaired UE  functional use, Improper body mechanics, Postural dysfunction, Pain  Visit Diagnosis: Pain in thoracic spine  Abnormal posture  Muscle weakness (generalized)  Other symptoms and signs involving the musculoskeletal system     Problem List Patient Active Problem List   Diagnosis Date Noted  . Body aches 07/22/2019  . Chronic respiratory failure with  hypoxia ([place]) 06/17/2019  . Allergic rhinitis 11/13/2018  . Shortness of breath 10/23/2018  . COPD exacerbation ([place]) 01/07/2015  . Cough 07/05/2013  . Acute sinusitis 02/12/2013  . Acute bronchitis 07/08/2011  . 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) 01/05/2011    Percival Spanish, PT, MPT 06/01/2020, 4:40 PM  [facility] [address]  [place], [place], [address] Phone: [phone]   Fax:  [phone]  Name: Pairlee Sawtell MRN: [id number] Date of Birth: 19-Jan-1946

## 2020-06-05 ENCOUNTER — Ambulatory Visit: Payer: Medicare Other | Admitting: Physical Therapy

## 2020-06-05 ENCOUNTER — Other Ambulatory Visit: Payer: Self-pay

## 2020-06-05 ENCOUNTER — Encounter: Payer: Self-pay | Admitting: Physical Therapy

## 2020-06-05 DIAGNOSIS — M6281 Muscle weakness (generalized): Secondary | ICD-10-CM

## 2020-06-05 DIAGNOSIS — R293 Abnormal posture: Secondary | ICD-10-CM | POA: Diagnosis not present

## 2020-06-05 DIAGNOSIS — R29898 Other symptoms and signs involving the musculoskeletal system: Secondary | ICD-10-CM

## 2020-06-05 DIAGNOSIS — M546 Pain in thoracic spine: Secondary | ICD-10-CM | POA: Diagnosis not present

## 2020-06-05 NOTE — Therapy (Signed)
[facility] [place] [facility] [address]  [place], [place], [address] Phone: [phone]   Fax:  [phone]  Physical Therapy Treatment  Patient Details  Name: Yolanda Morris MRN: [id number] Date of Birth: 1946-10-02 Referring Provider (PT): [name], MD   Encounter Date: 06/05/2020   PT End of Session - 06/05/20 1017    Visit Number 2    Number of Visits 12    Date for PT Re-Evaluation 07/13/20    Authorization Type Medicare & AARP    PT Start Time 1017    PT Stop Time 1106    PT Time Calculation (min) 49 min    Activity Tolerance Patient tolerated treatment well    Behavior During Therapy [facility] for tasks assessed/performed           Past Medical History:  Diagnosis Date  . Cancer ([place])    skin  . COPD (chronic obstructive pulmonary disease) ([place])   . DM (diabetes mellitus) ([place])   . Dyspnea    increased activity   . Genital herpes   . Hyperlipidemia   . Migraines   . Osteoporosis   . Unspecified essential hypertension     Past Surgical History:  Procedure Laterality Date  . BREAST LUMPECTOMY     benign  . COLON SURGERY     removal of polyps  . DENTAL SURGERY    . RETINAL TEAR REPAIR CRYOTHERAPY      There were no vitals filed for this visit.   Subjective Assessment - 06/05/20 1022    Subjective Pt reports her back pain has been worse this week but denies pain currently.    Pertinent History h/o multiple compression fracture (x2); COPD    Patient Stated Goals "learn how to do strengthening exercises for my back so I can be as healthly as I can be"    Currently in Pain? No/denies                             [facility] Adult PT Treatment/Exercise - 06/05/20 1017      Lumbar Exercises: Aerobic   Nustep L2 x 6 min (UE/LE)      Shoulder Exercises: Supine   Horizontal ABduction Both;10 reps;Strengthening;Theraband    Theraband Level (Shoulder Horizontal ABduction) Level 1 (Yellow)     Horizontal ABduction Limitations emphasis on scap retraction      Shoulder Exercises: Standing   Extension Both;10 reps;Strengthening;Theraband    Theraband Level (Shoulder Extension) Level 1 (Yellow)    Extension Limitations emphasis on scap retraction    Row Both;10 reps;Strengthening;Theraband    Theraband Level (Shoulder Row) Level 1 (Yellow)    Row Limitations emphasis on scap retraction    Retraction Both;10 reps;AROM   2 sets   Retraction Limitations into pool noodle on wall & along doorframe      Shoulder Exercises: Stretch   Corner Stretch 30 seconds;2 reps    Corner Stretch Limitations low doorway stretch; mid 1 x 30 sec but uncomfortable; unable to tolerate high doorway stretch    Other Shoulder Stretches Snow angel pec stretch 2 x 30 sec                  PT Education - 06/05/20 1106    Education Details Initial HEP - pec stretches & scap retraction    Person(s) Educated Patient    Methods Explanation;Demonstration;Verbal cues;Tactile cues;Handout    Comprehension Verbalized understanding;Verbal cues  required;Tactile cues required;Need further instruction            PT Short Term Goals - 06/05/20 1026      PT SHORT TERM GOAL #1   Title Patient will be independent with initial HEP    Status On-going    Target Date 06/22/20      PT SHORT TERM GOAL #2   Title Patient will verbalize/demonstrate understanding of neutral spine posture and proper body mechanics to reduce strain on thoracolumbar spine    Status On-going    Target Date 06/22/20             PT Long Term Goals - 06/05/20 1029      PT LONG TERM GOAL #1   Title Patient will be independent with ongoing/advanced HEP    Status On-going    Target Date 07/13/20      PT LONG TERM GOAL #2   Title Patient to verbalize/demonstrate understanding of appropriate posture and body mechanics needed for daily activities to reduce risk for osteoporotic fractures    Status On-going    Target Date 07/13/20       PT LONG TERM GOAL #3   Title Patient to report pain reduction in frequency and intensity by >/= 50%    Status On-going    Target Date 07/13/20      PT LONG TERM GOAL #4   Title Patient to improve thoracolumbar AROM to [facility] without pain provocation    Status On-going    Target Date 07/13/20      PT LONG TERM GOAL #5   Title Patient to report ability to perform ADLs, household and yardwork-related tasks without increased pain    Status On-going    Target Date 07/13/20                 Plan - 06/05/20 1029    Clinical Impression Statement Yolanda Morris reports increased pain this week but denies pain on arrival to PT today. Session focusing on postural stretching and strengthening to reduce anterior tightness and encourage scapular activation for improved postural alignment and stability. Pt demonstrating limited tolerance for stretching in upper ranges of shoulder elevation but able to tolerate low doorway pec stretches as well as mid-level snow angel stretches (initiated w/o towel roll/pool noodle but will attempt to progress to this in future visits). Cues necessary to avoid shoulder shrugs during scapular retraction ROM/strengthening. Initial HEP provided targeting exercises and stretches well tolerated during today's visit.    Personal Factors and Comorbidities Comorbidity 3+;Time since onset of injury/illness/exacerbation;Past/Current Experience;Fitness;Age    Comorbidities COPD with intermittent need fro O2, HTN, osteoporosis s/p vertebral compression fractures x 2, migraines/tension headache, DM-II    Examination-Activity Limitations Bed Mobility;Bend;Lift;Carry;Caring for Others;Locomotion Level;Reach Overhead;Sit;Sleep;Squat;Stand;Transfers    Examination-Participation Restrictions Cleaning;Community Activity;Interpersonal Relationship;Meal Prep;Shop;Yard Work    Rehab Potential Good    PT Frequency 2x / week    PT Duration 6 weeks    PT Treatment/Interventions ADLs/Self Care  Home Management;Cryotherapy;Electrical Stimulation;Iontophoresis 4mg /ml Dexamethasone;Moist Heat;Ultrasound;Gait training;Functional mobility training;Therapeutic activities;Therapeutic exercise;Balance training;Neuromuscular re-education;Patient/family education;Manual techniques;Passive range of motion;Dry needling;Taping    PT Next Visit Plan Review initial HEP PRN; postural flexibility and strengthening; thoracic mobilization and extension; manual therapy and modalites PRN    PT Home Exercise Plan 8/20 - low doorway & snow angel pec stretches, scap retraction +/- yellow TB, yellow TB rows & horiz ABD    Consulted and Agree with Plan of Care Patient  Patient will benefit from skilled therapeutic intervention in order to improve the following deficits and impairments:  Decreased activity tolerance, Decreased endurance, Decreased knowledge of precautions, Decreased mobility, Decreased range of motion, Decreased safety awareness, Decreased strength, Increased fascial restricitons, Increased muscle spasms, Impaired perceived functional ability, Impaired flexibility, Impaired UE functional use, Improper body mechanics, Postural dysfunction, Pain  Visit Diagnosis: Pain in thoracic spine  Abnormal posture  Muscle weakness (generalized)  Other symptoms and signs involving the musculoskeletal system     Problem List Patient Active Problem List   Diagnosis Date Noted  . Body aches 07/22/2019  . Chronic respiratory failure with hypoxia ([place]) 06/17/2019  . Allergic rhinitis 11/13/2018  . Shortness of breath 10/23/2018  . COPD exacerbation ([place]) 01/07/2015  . Cough 07/05/2013  . Acute sinusitis 02/12/2013  . Acute bronchitis 07/08/2011  . 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) 01/05/2011    Percival Spanish, PT, MPT 06/05/2020, 1:20 PM  [facility] [address]  [place], [place], [address] Phone: [phone]    Fax:  [phone]  Name: Yolanda Morris MRN: [id number] Date of Birth: 1945-10-25

## 2020-06-05 NOTE — Patient Instructions (Addendum)
    Home exercise program created by Anica Alcaraz, PT.  For questions, please contact Jamiaya Bina via phone at [phone] or email at [email or web address]  [facility] [place] [address]  [address] [place], , [address] Phone: [phone]   Fax:  [phone]    

## 2020-06-08 ENCOUNTER — Ambulatory Visit: Payer: Medicare Other

## 2020-06-08 DIAGNOSIS — E119 Type 2 diabetes mellitus without complications: Secondary | ICD-10-CM | POA: Diagnosis not present

## 2020-06-10 ENCOUNTER — Other Ambulatory Visit: Payer: Self-pay | Admitting: Family Medicine

## 2020-06-10 ENCOUNTER — Ambulatory Visit
Admission: RE | Admit: 2020-06-10 | Discharge: 2020-06-10 | Disposition: A | Discharge status: Home / Self Care | Payer: Medicare Other | Source: Ambulatory Visit | Attending: Family Medicine | Admitting: Family Medicine

## 2020-06-10 DIAGNOSIS — M25512 Pain in left shoulder: Secondary | ICD-10-CM | POA: Diagnosis not present

## 2020-06-10 DIAGNOSIS — M546 Pain in thoracic spine: Secondary | ICD-10-CM

## 2020-06-11 ENCOUNTER — Other Ambulatory Visit: Payer: Self-pay

## 2020-06-11 ENCOUNTER — Encounter: Payer: Self-pay | Admitting: Physical Therapy

## 2020-06-11 ENCOUNTER — Ambulatory Visit: Payer: Medicare Other | Admitting: Physical Therapy

## 2020-06-11 DIAGNOSIS — M546 Pain in thoracic spine: Secondary | ICD-10-CM | POA: Diagnosis not present

## 2020-06-11 DIAGNOSIS — R293 Abnormal posture: Secondary | ICD-10-CM

## 2020-06-11 DIAGNOSIS — M6281 Muscle weakness (generalized): Secondary | ICD-10-CM | POA: Diagnosis not present

## 2020-06-11 DIAGNOSIS — R29898 Other symptoms and signs involving the musculoskeletal system: Secondary | ICD-10-CM

## 2020-06-11 NOTE — Therapy (Signed)
[facility] [address]  [place], [place], [address] Phone: [phone]   Fax:  [phone]  Physical Therapy Treatment  Patient Details  Name: Keylani Perlstein MRN: [id number] Date of Birth: 1946-02-07 Referring Provider (PT): [name], MD   Encounter Date: 06/11/2020   PT End of Session - 06/11/20 0850    Visit Number 3    Number of Visits 12    Date for PT Re-Evaluation 07/13/20    Authorization Type Medicare & AARP    PT Start Time 0850    PT Stop Time 0951    PT Time Calculation (min) 61 min    Activity Tolerance Patient tolerated treatment well    Behavior During Therapy [facility] for tasks assessed/performed           Past Medical History:  Diagnosis Date  . Cancer ([place])    skin  . COPD (chronic obstructive pulmonary disease) ([place])   . DM (diabetes mellitus) ([place])   . Dyspnea    increased activity   . Genital herpes   . Hyperlipidemia   . Migraines   . Osteoporosis   . Unspecified essential hypertension     Past Surgical History:  Procedure Laterality Date  . BREAST LUMPECTOMY     benign  . COLON SURGERY     removal of polyps  . DENTAL SURGERY    . RETINAL TEAR REPAIR CRYOTHERAPY      There were no vitals filed for this visit.   Subjective Assessment - 06/11/20 0854    Subjective Pt reports pain has been more in her shoulder blade area this week, wrapping around her side and into her L arm.    Pertinent History h/o multiple compression fracture (x2); COPD    Patient Stated Goals "learn how to do strengthening exercises for my back so I can be as healthly as I can be"    Currently in Pain? Yes    Pain Score 3     Pain Location Scapula    Pain Orientation Left    Pain Descriptors / Indicators Dull;Aching    Pain Type Acute pain;Chronic pain                             [facility] Adult PT Treatment/Exercise - 06/11/20 0850      Lumbar Exercises: Aerobic   UBE (Upper  Arm Bike) L1.0 x 4 min (2' fwd/2'back)      Shoulder Exercises: Supine   Horizontal ABduction Both;10 reps;Strengthening;Theraband    Theraband Level (Shoulder Horizontal ABduction) Level 1 (Yellow)    Horizontal ABduction Limitations emphasis on scap retraction; hook lying on towel roll    External Rotation Both;10 reps;Strengthening;Theraband    Theraband Level (Shoulder External Rotation) Level 1 (Yellow)    External Rotation Limitations emphasis on scap retraction; hook lying on towel roll      Shoulder Exercises: Sidelying   Other Sidelying Exercises R/L open book stretch 10x 5"      Shoulder Exercises: Standing   Extension Both;10 reps;Strengthening;Theraband    Theraband Level (Shoulder Extension) Level 1 (Yellow)    Extension Limitations emphasis on scap retraction    Row Both;10 reps;Strengthening;Theraband    Theraband Level (Shoulder Row) Level 1 (Yellow)    Row Limitations emphasis on scap retraction    Retraction Both;10 reps;AROM   5" hold   Retraction Limitations into pool noodle on wall  Shoulder Exercises: Research officer, trade union Limitations low doorway stretch    Other Shoulder Stretches Brewing technologist hooklying on towel roll 2 x 30 sec      Modalities   Modalities Electrical Stimulation;Moist Heat      Moist Heat Therapy   Number Minutes Moist Heat 15 Minutes    Moist Heat Location Cervical   & thoracic spine     Electrical Stimulation   Electrical Stimulation Location L thoracic paraspinals & periscapular muscles    Electrical Stimulation Action IFC    Electrical Stimulation Parameters 80-150 Hz, intensity to pt tol x 15'                    PT Short Term Goals - 06/11/20 0930      PT SHORT TERM GOAL #1   Title Patient will be independent with initial HEP    Status Achieved   06/11/20     PT SHORT TERM GOAL #2   Title Patient will verbalize/demonstrate understanding of neutral spine posture  and proper body mechanics to reduce strain on thoracolumbar spine    Status On-going    Target Date 06/22/20             PT Long Term Goals - 06/05/20 1029      PT LONG TERM GOAL #1   Title Patient will be independent with ongoing/advanced HEP    Status On-going    Target Date 07/13/20      PT LONG TERM GOAL #2   Title Patient to verbalize/demonstrate understanding of appropriate posture and body mechanics needed for daily activities to reduce risk for osteoporotic fractures    Status On-going    Target Date 07/13/20      PT LONG TERM GOAL #3   Title Patient to report pain reduction in frequency and intensity by >/= 50%    Status On-going    Target Date 07/13/20      PT LONG TERM GOAL #4   Title Patient to improve thoracolumbar AROM to [facility] without pain provocation    Status On-going    Target Date 07/13/20      PT LONG TERM GOAL #5   Title Patient to report ability to perform ADLs, household and yardwork-related tasks without increased pain    Status On-going    Target Date 07/13/20                 Plan - 06/11/20 0857    Clinical Impression Statement Lorann missed her visit earlier this week due to BP issues but states her BP has been good yesterday and this morning. Notes she has been having more pain in the L scapular region this week - told MD and x-ray of scapula taken with no issues identified. Initial HEP reviewed with good return demonstration (STG #1 met) and pt able to tolerate addition of towel roll along spine for supine exercises/stretches. Limited progression of flexibility and postural strengthening attempted with good tolerance but no updates to HEP today beyond progression of initial HEP as described above. Session concluded with trial of estim and moist heat with pt noting it helped her relax and reduce pain - will plan to discuss options for home TENS units as pt interested next visit.    Personal Factors and Comorbidities Comorbidity 3+;Time since  onset of injury/illness/exacerbation;Past/Current Experience;Fitness;Age    Comorbidities COPD with intermittent need fro O2, HTN, osteoporosis s/p vertebral compression  fractures x 2, migraines/tension headache, DM-II    Examination-Activity Limitations Bed Mobility;Bend;Lift;Carry;Caring for Others;Locomotion Level;Reach Overhead;Sit;Sleep;Squat;Stand;Transfers    Examination-Participation Restrictions Cleaning;Community Activity;Interpersonal Relationship;Meal Prep;Shop;Yard Work    Rehab Potential Good    PT Frequency 2x / week    PT Duration 6 weeks    PT Treatment/Interventions ADLs/Self Care Home Management;Cryotherapy;Electrical Stimulation;Iontophoresis 22m/ml Dexamethasone;Moist Heat;Ultrasound;Gait training;Functional mobility training;Therapeutic activities;Therapeutic exercise;Balance training;Neuromuscular re-education;Patient/family education;Manual techniques;Passive range of motion;Dry needling;Taping    PT Next Visit Plan provide info on home TENS unit options; posture & body mechanics education for typical daily tasks; postural flexibility and strengthening; thoracic mobilization and extension; manual therapy and modalites PRN; review & update HEP PRN    PT Home Exercise Plan 8/20 - low doorway & snow angel pec stretches, scap retraction +/- yellow TB, yellow TB rows & horiz ABD    Consulted and Agree with Plan of Care Patient           Patient will benefit from skilled therapeutic intervention in order to improve the following deficits and impairments:  Decreased activity tolerance, Decreased endurance, Decreased knowledge of precautions, Decreased mobility, Decreased range of motion, Decreased safety awareness, Decreased strength, Increased fascial restricitons, Increased muscle spasms, Impaired perceived functional ability, Impaired flexibility, Impaired UE functional use, Improper body mechanics, Postural dysfunction, Pain  Visit Diagnosis: Pain in thoracic spine  Abnormal  posture  Muscle weakness (generalized)  Other symptoms and signs involving the musculoskeletal system     Problem List Patient Active Problem List   Diagnosis Date Noted  . Body aches 07/22/2019  . Chronic respiratory failure with hypoxia ([place] 06/17/2019  . Allergic rhinitis 11/13/2018  . Shortness of breath 10/23/2018  . COPD exacerbation ([place] 01/07/2015  . Cough 07/05/2013  . Acute sinusitis 02/12/2013  . Acute bronchitis 07/08/2011  . 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) 01/05/2011    JPercival Spanish PT, MPT 06/11/2020, 12:25 PM  [facility] [place] [address]Phone: [phone]  Fax:  [phone] Name: DEvaline WaltmanMRN: [id number] of Birth: 706-21-1947

## 2020-06-15 ENCOUNTER — Other Ambulatory Visit: Payer: Self-pay

## 2020-06-15 ENCOUNTER — Ambulatory Visit: Payer: Medicare Other

## 2020-06-15 DIAGNOSIS — M546 Pain in thoracic spine: Secondary | ICD-10-CM | POA: Diagnosis not present

## 2020-06-15 DIAGNOSIS — M6281 Muscle weakness (generalized): Secondary | ICD-10-CM

## 2020-06-15 DIAGNOSIS — R29898 Other symptoms and signs involving the musculoskeletal system: Secondary | ICD-10-CM | POA: Diagnosis not present

## 2020-06-15 DIAGNOSIS — R293 Abnormal posture: Secondary | ICD-10-CM

## 2020-06-15 NOTE — Therapy (Signed)
[facility] [address]  [place], [place], [address] Phone: [phone]   Fax:  [phone]  Physical Therapy Treatment  Patient Details  Name: Yolanda Morris MRN: [id number] Date of Birth: 1946/06/20 Referring Provider (PT): [name], MD   Encounter Date: 06/15/2020   PT End of Session - 06/15/20 0949    Visit Number 4    Number of Visits 12    Date for PT Re-Evaluation 07/13/20    Authorization Type Medicare & AARP    PT Start Time 0935    PT Stop Time 1050    PT Time Calculation (min) 75 min    Activity Tolerance Patient tolerated treatment well    Behavior During Therapy [facility] for tasks assessed/performed           Past Medical History:  Diagnosis Date  . Cancer ([place])    skin  . COPD (chronic obstructive pulmonary disease) ([place])   . DM (diabetes mellitus) ([place])   . Dyspnea    increased activity   . Genital herpes   . Hyperlipidemia   . Migraines   . Osteoporosis   . Unspecified essential hypertension     Past Surgical History:  Procedure Laterality Date  . BREAST LUMPECTOMY     benign  . COLON SURGERY     removal of polyps  . DENTAL SURGERY    . RETINAL TEAR REPAIR CRYOTHERAPY      There were no vitals filed for this visit.   Subjective Assessment - 06/15/20 0943    Subjective Pt. noting she lifted case of water at grocery store and irritated her back yesterday which has improved today.    Pertinent History h/o multiple compression fracture (x2); COPD    Patient Stated Goals "learn how to do strengthening exercises for my back so I can be as healthly as I can be"    Currently in Pain? No/denies    Pain Score 0-No pain   up to 5/10 at worst   Pain Location Scapula    Pain Orientation Left    Pain Descriptors / Indicators Dull;Aching    Pain Type Acute pain;Chronic pain    Multiple Pain Sites No                             [facility] Adult PT Treatment/Exercise -  06/15/20 0001      Self-Care   Self-Care Posture;Other Self-Care Comments    Posture instruction in proper sitting and standing posture to reduce back strain     Other Self-Care Comments  Instuction in proper posture and body mechanics with daily activities to reduce lumbar strain       Lumbar Exercises: Aerobic   UBE (Upper Arm Bike) L1.0 x 6 min - forwards only      Shoulder Exercises: Supine   Horizontal ABduction Both;Strengthening;Theraband;15 reps    Theraband Level (Shoulder Horizontal ABduction) Level 1 (Yellow)    External Rotation Both;10 reps;Strengthening;Theraband    Theraband Level (Shoulder External Rotation) Level 1 (Yellow)    Flexion Right;Left;10 reps;Theraband    Theraband Level (Shoulder Flexion) Level 1 (Yellow)    Flexion Limitations alternating flexion/ext. hooklying       Shoulder Exercises: Stretch   Corner Stretch 30 seconds;2 reps    Corner Stretch Limitations low doorway stretch      Moist Heat Therapy   Number Minutes Moist Heat 15 Minutes    Moist  Heat Location Lumbar Spine   mid thoracic      Electrical Stimulation   Electrical Stimulation Location B thoracic paraspinals & periscapular muscles    Electrical Stimulation Action IFC    Electrical Stimulation Parameters 80-150Hz , intensity to pt. tolerance, 15'    Electrical Stimulation Goals Pain      Manual Therapy   Manual Therapy Soft tissue mobilization    Manual therapy comments sitting     Soft tissue mobilization STM to B UT, rhomboids - TP noted in B UT                  PT Education - 06/15/20 1049    Education Details posture and body mechanics handout    Person(s) Educated Patient    Methods Explanation;Demonstration;Verbal cues;Handout    Comprehension Verbalized understanding;Returned demonstration;Verbal cues required            PT Short Term Goals - 06/11/20 0930      PT SHORT TERM GOAL #1   Title Patient will be independent with initial HEP    Status Achieved    06/11/20     PT SHORT TERM GOAL #2   Title Patient will verbalize/demonstrate understanding of neutral spine posture and proper body mechanics to reduce strain on thoracolumbar spine    Status On-going    Target Date 06/22/20             PT Long Term Goals - 06/05/20 1029      PT LONG TERM GOAL #1   Title Patient will be independent with ongoing/advanced HEP    Status On-going    Target Date 07/13/20      PT LONG TERM GOAL #2   Title Patient to verbalize/demonstrate understanding of appropriate posture and body mechanics needed for daily activities to reduce risk for osteoporotic fractures    Status On-going    Target Date 07/13/20      PT LONG TERM GOAL #3   Title Patient to report pain reduction in frequency and intensity by >/= 50%    Status On-going    Target Date 07/13/20      PT LONG TERM GOAL #4   Title Patient to improve thoracolumbar AROM to [facility] without pain provocation    Status On-going    Target Date 07/13/20      PT LONG TERM GOAL #5   Title Patient to report ability to perform ADLs, household and yardwork-related tasks without increased pain    Status On-going    Target Date 07/13/20                 Plan - 06/15/20 0952    Clinical Impression Statement Yolanda Morris noting she lifted a case of water bottles (32 bottles) at grocery store which irritated her back pain on Friday.  Focused session on instruction in proper posture and body mechanics with lifting and other daily activities to reduced lumbar/thoracic strain.  Duration of session focused on gentle postural strengthening activities with yellow TB resistance which was tolerated well without increased pain.  Did have onset of upper back tension/pain after sitting with postural education thus ended session with E-stim/moist heat to thoracic spine for pain relief and good relief noted.  Pt. opting to defer instruction on home TENS unit purchase options as she is unsure if she will be able to place  electrodes by herself.  May consider further MT to upper shoulder musculature for improved tissue quality in coming sessions.    Comorbidities COPD  with intermittent need fro O2, HTN, osteoporosis s/p vertebral compression fractures x 2, migraines/tension headache, DM-II    Rehab Potential Good    PT Frequency 2x / week    PT Treatment/Interventions ADLs/Self Care Home Management;Cryotherapy;Electrical Stimulation;Iontophoresis 4mg /ml Dexamethasone;Moist Heat;Ultrasound;Gait training;Functional mobility training;Therapeutic activities;Therapeutic exercise;Balance training;Neuromuscular re-education;Patient/family education;Manual techniques;Passive range of motion;Dry needling;Taping    PT Next Visit Plan Posture & body mechanics education for typical daily tasks; postural flexibility and strengthening; thoracic mobilization and extension; manual therapy and modalites PRN; review & update HEP PRN    PT Home Exercise Plan 8/20 - low doorway & snow angel pec stretches, scap retraction +/- yellow TB, yellow TB rows & horiz ABD           Patient will benefit from skilled therapeutic intervention in order to improve the following deficits and impairments:  Decreased activity tolerance, Decreased endurance, Decreased knowledge of precautions, Decreased mobility, Decreased range of motion, Decreased safety awareness, Decreased strength, Increased fascial restricitons, Increased muscle spasms, Impaired perceived functional ability, Impaired flexibility, Impaired UE functional use, Improper body mechanics, Postural dysfunction, Pain  Visit Diagnosis: Pain in thoracic spine  Abnormal posture  Muscle weakness (generalized)  Other symptoms and signs involving the musculoskeletal system     Problem List Patient Active Problem List   Diagnosis Date Noted  . Body aches 07/22/2019  . Chronic respiratory failure with hypoxia ([place]) 06/17/2019  . Allergic rhinitis 11/13/2018  . Shortness of breath  10/23/2018  . COPD exacerbation ([place]) 01/07/2015  . Cough 07/05/2013  . Acute sinusitis 02/12/2013  . Acute bronchitis 07/08/2011  . 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) 01/05/2011    Bess Harvest, PTA 06/15/20 11:01 AM   [facility] [address]  [place], [place], [address] Phone: [phone]   Fax:  [phone]  Name: Yolanda Morris MRN: [id number] Date of Birth: 04-Jun-1946

## 2020-06-15 NOTE — Patient Instructions (Signed)

## 2020-06-16 DIAGNOSIS — E119 Type 2 diabetes mellitus without complications: Secondary | ICD-10-CM | POA: Diagnosis not present

## 2020-06-18 ENCOUNTER — Encounter: Payer: Self-pay | Admitting: Physical Therapy

## 2020-06-18 ENCOUNTER — Other Ambulatory Visit: Payer: Self-pay

## 2020-06-18 ENCOUNTER — Ambulatory Visit: Payer: Medicare Other | Attending: Family Medicine | Admitting: Physical Therapy

## 2020-06-18 DIAGNOSIS — R29898 Other symptoms and signs involving the musculoskeletal system: Secondary | ICD-10-CM | POA: Diagnosis not present

## 2020-06-18 DIAGNOSIS — M546 Pain in thoracic spine: Secondary | ICD-10-CM | POA: Diagnosis not present

## 2020-06-18 DIAGNOSIS — R293 Abnormal posture: Secondary | ICD-10-CM | POA: Diagnosis not present

## 2020-06-18 DIAGNOSIS — M6281 Muscle weakness (generalized): Secondary | ICD-10-CM | POA: Diagnosis not present

## 2020-06-18 NOTE — Patient Instructions (Signed)
    Home exercise program created by Terrye Dombrosky, PT.  For questions, please contact Deicy Rusk via phone at [phone] or email at [email or web address]  [facility] [address]  [address] [place], [place], [address] Phone: [phone]   Fax:  [phone]    

## 2020-06-18 NOTE — Therapy (Signed)
[facility] [address]  [place], [place], [address] Phone: [phone]   Fax:  [phone]  Physical Therapy Treatment  Patient Details  Name: Yolanda Morris MRN: [id number] Date of Birth: 1946/07/01 Referring Provider (PT): [name], MD   Encounter Date: 06/18/2020   PT End of Session - 06/18/20 0935    Visit Number 5    Number of Visits 12    Date for PT Re-Evaluation 07/13/20    Authorization Type Medicare & AARP    PT Start Time 0935    PT Stop Time 1022    PT Time Calculation (min) 47 min    Activity Tolerance Patient tolerated treatment well    Behavior During Therapy [facility] for tasks assessed/performed           Past Medical History:  Diagnosis Date   Cancer ([place])    skin   COPD (chronic obstructive pulmonary disease) ([place])    DM (diabetes mellitus) ([place])    Dyspnea    increased activity    Genital herpes    Hyperlipidemia    Migraines    Osteoporosis    Unspecified essential hypertension     Past Surgical History:  Procedure Laterality Date   BREAST LUMPECTOMY     benign   COLON SURGERY     removal of polyps   DENTAL SURGERY     RETINAL TEAR REPAIR CRYOTHERAPY      There were no vitals filed for this visit.   Subjective Assessment - 06/18/20 0939    Subjective Pt reports a brief sharp pan when she turned the wrong way last night but otherwise just a mild dull pain currently - states her breathing is as much or more of a concern today.    Pertinent History h/o multiple compression fracture (x2); COPD    Patient Stated Goals "learn how to do strengthening exercises for my back so I can be as healthly as I can be"    Currently in Pain? Yes    Pain Score 2     Pain Location Back    Pain Orientation Mid;Left;Medial    Pain Descriptors / Indicators Dull    Pain Type Acute pain;Chronic pain                             [facility] Adult PT  Treatment/Exercise - 06/18/20 0935      Transfers   Transfers Floor to Transfer;Sit to Stand;Stand to Sit    Sit to Stand 5: Supervision;With upper extremity assist   from low surface (~14 inch)   Sit to Stand Details Verbal cues for technique;Tactile cues for weight shifting;Tactile cues for posture    Sit to Stand Details (indicate cue type and reason) Encouraged fwd weight shift while avoiding excessive trunk flexion during sit to stand from low surface if pt were to use a small seat/stool while working in the yard or with activities that she normally has to bend over for around the house.    Stand to Sit 5: Supervision;4: Min guard;Without upper extremity assist;Uncontrolled descent;With upper extremity assist    Stand to Sit Details (indicate cue type and reason) Verbal cues for technique;Tactile cues for weight shifting;Tactile cues for posture    Floor to Transfer 4: Min guard;With upper extremity assist    Floor to Transfer Details (indicate cue type and reason) Demonstrated floor to stand transfer from quadruped via 1/2 kneel  pushing from hand on knee and floor - pt able to perform return demonstration with CGA of PT but demonstrated tendency for fwd LOB/unsteadiness.    Floor to Transfer Details Verbal cues for technique;Verbal cues for sequencing      Self-Care   Self-Care Lifting;Posture    Lifting Reviewed posture and body mechanics targeting lifting mechanics with activities such as lifting pack of water bottles while shopping.    Posture Reviewed information from posture and body mechanics training from last session, emphasizing posture while vacuuming and gardening, including ideas for use of stool/seat to allow her to work at lower heights w/o having to maintain or recover from squat or transfer to standing from floor/ground as she states she needs to rely on UE assist.      Therapeutic Activites    Therapeutic Activities Lifting    Lifting Simulated lifting tasks up to 10#  targeting positioning of feet/body relative to object, squatting down rather than bending and lifting with legs keeping object close to body.      Neck Exercises: Seated   Neck Retraction 10 reps;5 secs    Other Seated Exercise Thoracic extension/mobilization over back of chair with arms crossed over chest 5 x 10" - cues for cervical retraction for neutral spine while avoiding excessive cervical extension      Lumbar Exercises: Aerobic   Nustep L2 x 6 min (UE/LE)                    PT Short Term Goals - 06/18/20 1013      PT SHORT TERM GOAL #1   Title Patient will be independent with initial HEP    Status Achieved   06/11/20     PT SHORT TERM GOAL #2   Title Patient will verbalize/demonstrate understanding of neutral spine posture and proper body mechanics to reduce strain on thoracolumbar spine    Status Achieved   06/18/20            PT Long Term Goals - 06/05/20 1029      PT LONG TERM GOAL #1   Title Patient will be independent with ongoing/advanced HEP    Status On-going    Target Date 07/13/20      PT LONG TERM GOAL #2   Title Patient to verbalize/demonstrate understanding of appropriate posture and body mechanics needed for daily activities to reduce risk for osteoporotic fractures    Status On-going    Target Date 07/13/20      PT LONG TERM GOAL #3   Title Patient to report pain reduction in frequency and intensity by >/= 50%    Status On-going    Target Date 07/13/20      PT LONG TERM GOAL #4   Title Patient to improve thoracolumbar AROM to [facility] without pain provocation    Status On-going    Target Date 07/13/20      PT LONG TERM GOAL #5   Title Patient to report ability to perform ADLs, household and yardwork-related tasks without increased pain    Status On-going    Target Date 07/13/20                 Plan - 06/18/20 1022    Clinical Impression Statement Yolanda Morris noting increased work of breathing today but did not bring her O2 tank with  her to PT - VS monitored periodically t/o session with O2 sats remaining >/= 93%. Questioning regarding posture and body mechanics training from last session indicating  need for extensive review including ideas for personal accommodations and simulation of some activities as well as practice at lifting and transfer techniques to allow her to follow posture and body mechanics recommendations. Upon completion of session, patient reporting better understanding and awareness of how to incorporate recommendations into her daily routine - STG #2 met.    Comorbidities COPD with intermittent need fro O2, HTN, osteoporosis s/p vertebral compression fractures x 2, migraines/tension headache, DM-II    Rehab Potential Good    PT Frequency 2x / week    PT Duration 6 weeks    PT Treatment/Interventions ADLs/Self Care Home Management;Cryotherapy;Electrical Stimulation;Iontophoresis 1m/ml Dexamethasone;Moist Heat;Ultrasound;Gait training;Functional mobility training;Therapeutic activities;Therapeutic exercise;Balance training;Neuromuscular re-education;Patient/family education;Manual techniques;Passive range of motion;Dry needling;Taping    PT Next Visit Plan postural flexibility and strengthening; thoracic mobilization and extension; manual therapy and modalites PRN; review & update HEP PRN; review of posture & body mechanics education PRN    PT Home Exercise Plan 8/20 - low doorway & snow angel pec stretches, scap retraction +/- yellow TB, yellow TB rows & horiz ABD    Consulted and Agree with Plan of Care Patient           Patient will benefit from skilled therapeutic intervention in order to improve the following deficits and impairments:  Decreased activity tolerance, Decreased endurance, Decreased knowledge of precautions, Decreased mobility, Decreased range of motion, Decreased safety awareness, Decreased strength, Increased fascial restricitons, Increased muscle spasms, Impaired perceived functional ability,  Impaired flexibility, Impaired UE functional use, Improper body mechanics, Postural dysfunction, Pain  Visit Diagnosis: Pain in thoracic spine  Abnormal posture  Muscle weakness (generalized)  Other symptoms and signs involving the musculoskeletal system     Problem List Patient Active Problem List   Diagnosis Date Noted   Body aches 07/22/2019   Chronic respiratory failure with hypoxia (HCC) 06/17/2019   Allergic rhinitis 11/13/2018   Shortness of breath 10/23/2018   [place]D [place] 01/07/2015   Cough 07/05/2013   Acute sinusitis 02/12/2013   Acute bronchitis 07/08/2011   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) 01/05/2011    JPercival Spanish PT, MPT 06/18/2020, 12:25 PM  [facility] [place] [address]Phone: [phone]  Fax:  [phone] Name: DJudye LorinoMRN: [id number] of Birth: 707/07/1946

## 2020-06-19 ENCOUNTER — Telehealth: Payer: Self-pay | Admitting: Critical Care Medicine

## 2020-06-19 MED ORDER — TRELEGY ELLIPTA 200-62.5-25 MCG/INH IN AEPB: 1.0000 | INHALATION_SPRAY | Freq: Every day | RESPIRATORY_TRACT | 0 refills | Status: DC

## 2020-06-19 NOTE — Telephone Encounter (Signed)
Ok with me- she can have one if we have any available.  Julian Hy, DO 06/19/20 5:02 PM [facility]

## 2020-06-19 NOTE — Telephone Encounter (Signed)
Spoke with pt, states she would like to try a sample of Trelegy to see if it works well for her.  Pt states if it does work well she would be willing to switch insurance policies to one that covers Trelegy.    Dr Carlis Abbott please advise if ok to try sample of trelegy, and if so what dosage.  Thanks!

## 2020-06-19 NOTE — Telephone Encounter (Signed)
Pt aware of recs.  Sample left up front for pt.  Nothing further needed at this time- will close encounter.

## 2020-06-23 ENCOUNTER — Other Ambulatory Visit: Payer: Self-pay

## 2020-06-23 ENCOUNTER — Ambulatory Visit: Payer: Medicare Other

## 2020-06-23 VITALS — HR 110

## 2020-06-23 DIAGNOSIS — M6281 Muscle weakness (generalized): Secondary | ICD-10-CM | POA: Diagnosis not present

## 2020-06-23 DIAGNOSIS — R293 Abnormal posture: Secondary | ICD-10-CM | POA: Diagnosis not present

## 2020-06-23 DIAGNOSIS — R29898 Other symptoms and signs involving the musculoskeletal system: Secondary | ICD-10-CM

## 2020-06-23 DIAGNOSIS — M546 Pain in thoracic spine: Secondary | ICD-10-CM

## 2020-06-23 NOTE — Therapy (Signed)
[facility] [address]  [place], [place], [address] Phone: [phone]   Fax:  [phone]  Physical Therapy Treatment  Patient Details  Name: Yolanda Morris MRN: [id number] Date of Birth: 06/04/1946 Referring Provider (PT): [name], MD   Encounter Date: 06/23/2020   PT End of Session - 06/23/20 0940    Visit Number 6    Number of Visits 12    Date for PT Re-Evaluation 07/13/20    Authorization Type Medicare & AARP    PT Start Time 0929    PT Stop Time 1025   Ended visit with 10 min moist heat   PT Time Calculation (min) 56 min    Activity Tolerance Patient tolerated treatment well    Behavior During Therapy [facility] for tasks assessed/performed           Past Medical History:  Diagnosis Date  . Cancer ([place])    skin  . COPD (chronic obstructive pulmonary disease) ([place])   . DM (diabetes mellitus) ([place])   . Dyspnea    increased activity   . Genital herpes   . Hyperlipidemia   . Migraines   . Osteoporosis   . Unspecified essential hypertension     Past Surgical History:  Procedure Laterality Date  . BREAST LUMPECTOMY     benign  . COLON SURGERY     removal of polyps  . DENTAL SURGERY    . RETINAL TEAR REPAIR CRYOTHERAPY      Vitals:   06/23/20 0934 06/23/20 1305  Pulse: 87 (!) 110  SpO2: 96% 96%     Subjective Assessment - 06/23/20 0934    Subjective Pt. noting she has been having some L-sided shoulder blade pain over the last few days.    Pertinent History h/o multiple compression fracture (x2); COPD    Patient Stated Goals "learn how to do strengthening exercises for my back so I can be as healthly as I can be"    Currently in Pain? No/denies    Pain Score 0-No pain   pain up to a 4/10   Pain Location Scapula    Pain Orientation Left;Mid;Lower                             [facility] Adult PT Treatment/Exercise - 06/23/20 0001      Self-Care   Self-Care Other  Self-Care Comments    Other Self-Care Comments  instruction on self-massage with tennis ball on wall x 1 min to rhomboids and mid back musculature       Neck Exercises: Machines for Strengthening   Cybex Row low row 5# x 15       Neck Exercises: Seated   Neck Retraction 10 reps;5 secs    Neck Retraction Limitations + chin tuck     Other Seated Exercise seated horizontal abduction with yellow TB performed in low ROM (band pulled across stomach) x 10 reps       Lumbar Exercises: Aerobic   UBE (Upper Arm Bike) L1.0 x 6 min - forwards only      Shoulder Exercises: Standing   External Rotation 10 reps;Theraband;Strengthening    Theraband Level (Shoulder External Rotation) Level 1 (Yellow)    External Rotation Limitations leaning on doorseal       Shoulder Exercises: Stretch   Corner Stretch 30 seconds;2 reps    Corner Stretch Limitations low doorway stretch, and B single arm mid  Moist Heat Therapy   Number Minutes Moist Heat 10 Minutes    Moist Heat Location Lumbar Spine   and thoracic spine seated      Manual Therapy   Manual Therapy Soft tissue mobilization    Manual therapy comments sitting     Soft tissue mobilization STM to B UT, rhomboids, lats                   PT Education - 06/23/20 1259    Education Details HEP update: no money    Person(s) Educated Patient    Methods Explanation;Demonstration;Verbal cues;Handout    Comprehension Verbalized understanding;Returned demonstration;Verbal cues required            PT Short Term Goals - 06/18/20 1013      PT SHORT TERM GOAL #1   Title Patient will be independent with initial HEP    Status Achieved   06/11/20     PT SHORT TERM GOAL #2   Title Patient will verbalize/demonstrate understanding of neutral spine posture and proper body mechanics to reduce strain on thoracolumbar spine    Status Achieved   06/18/20            PT Long Term Goals - 06/05/20 1029      PT LONG TERM GOAL #1   Title Patient  will be independent with ongoing/advanced HEP    Status On-going    Target Date 07/13/20      PT LONG TERM GOAL #2   Title Patient to verbalize/demonstrate understanding of appropriate posture and body mechanics needed for daily activities to reduce risk for osteoporotic fractures    Status On-going    Target Date 07/13/20      PT LONG TERM GOAL #3   Title Patient to report pain reduction in frequency and intensity by >/= 50%    Status On-going    Target Date 07/13/20      PT LONG TERM GOAL #4   Title Patient to improve thoracolumbar AROM to [facility] without pain provocation    Status On-going    Target Date 07/13/20      PT LONG TERM GOAL #5   Title Patient to report ability to perform ADLs, household and yardwork-related tasks without increased pain    Status On-going    Target Date 07/13/20                 Plan - 06/23/20 0941    Clinical Impression Statement Yolanda Morris reporting shortness of breath following UBE warmup to start session (O2 saturation 96% post-warmup).  Vitals remained WFL throughout session.  Pt. primary complaint was L scapular pain to start session.  MT addressed increased tension/tenderness in B thoracic/scapular musculature with some relief noted following.  Progressed periscapular strengthening today with mild LBP/thoracic pain reported to end session thus applied moist heat with good relief.  Pt. reporting she feels her overall pain has improved since starting therapy.    Comorbidities COPD with intermittent need fro O2, HTN, osteoporosis s/p vertebral compression fractures x 2, migraines/tension headache, DM-II    Rehab Potential Good    PT Frequency 2x / week    PT Duration 6 weeks    PT Treatment/Interventions ADLs/Self Care Home Management;Cryotherapy;Electrical Stimulation;Iontophoresis 4mg /ml Dexamethasone;Moist Heat;Ultrasound;Gait training;Functional mobility training;Therapeutic activities;Therapeutic exercise;Balance training;Neuromuscular  re-education;Patient/family education;Manual techniques;Passive range of motion;Dry needling;Taping    PT Next Visit Plan postural flexibility and strengthening; thoracic mobilization and extension; manual therapy and modalites PRN; review & update HEP PRN; review of posture &  body mechanics education PRN    PT Home Exercise Plan 8/20 - low doorway & snow angel pec stretches, scap retraction +/- yellow TB, yellow TB rows & horiz ABD    Consulted and Agree with Plan of Care Patient           Patient will benefit from skilled therapeutic intervention in order to improve the following deficits and impairments:  Decreased activity tolerance, Decreased endurance, Decreased knowledge of precautions, Decreased mobility, Decreased range of motion, Decreased safety awareness, Decreased strength, Increased fascial restricitons, Increased muscle spasms, Impaired perceived functional ability, Impaired flexibility, Impaired UE functional use, Improper body mechanics, Postural dysfunction, Pain  Visit Diagnosis: Pain in thoracic spine  Abnormal posture  Muscle weakness (generalized)  Other symptoms and signs involving the musculoskeletal system     Problem List Patient Active Problem List   Diagnosis Date Noted  . Body aches 07/22/2019  . Chronic respiratory failure with hypoxia ([place]) 06/17/2019  . Allergic rhinitis 11/13/2018  . Shortness of breath 10/23/2018  . COPD exacerbation ([place]) 01/07/2015  . Cough 07/05/2013  . Acute sinusitis 02/12/2013  . Acute bronchitis 07/08/2011  . 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) 01/05/2011   Bess Harvest, PTA 06/23/20 1:09 PM   [facility] [address]  [place], [place], [address] Phone: [phone]   Fax:  [phone]  Name: Yolanda Morris MRN: [id number] Date of Birth: [birth year]

## 2020-06-25 ENCOUNTER — Other Ambulatory Visit: Payer: Self-pay

## 2020-06-25 ENCOUNTER — Encounter: Payer: Self-pay | Admitting: Physical Therapy

## 2020-06-25 ENCOUNTER — Ambulatory Visit: Payer: Medicare Other | Admitting: Physical Therapy

## 2020-06-25 DIAGNOSIS — R29898 Other symptoms and signs involving the musculoskeletal system: Secondary | ICD-10-CM | POA: Diagnosis not present

## 2020-06-25 DIAGNOSIS — M546 Pain in thoracic spine: Secondary | ICD-10-CM

## 2020-06-25 DIAGNOSIS — R293 Abnormal posture: Secondary | ICD-10-CM

## 2020-06-25 DIAGNOSIS — M6281 Muscle weakness (generalized): Secondary | ICD-10-CM

## 2020-06-25 NOTE — Therapy (Signed)
[facility] [address]  [place], [place], [address] Phone: [phone]   Fax:  [phone]  Physical Therapy Treatment  Patient Details  Name: Yolanda Morris MRN: [id number] Date of Birth: 1946-02-13 Referring Provider (PT): [name], MD   Encounter Date: 06/25/2020   PT End of Session - 06/25/20 0931    Visit Number 7    Number of Visits 12    Date for PT Re-Evaluation 07/13/20    Authorization Type Medicare & AARP    PT Start Time [phone]    PT Stop Time 1029    PT Time Calculation (min) 58 min    Activity Tolerance Patient tolerated treatment well    Behavior During Therapy [facility] for tasks assessed/performed           Past Medical History:  Diagnosis Date  . Cancer ([place])    skin  . COPD (chronic obstructive pulmonary disease) ([place])   . DM (diabetes mellitus) ([place])   . Dyspnea    increased activity   . Genital herpes   . Hyperlipidemia   . Migraines   . Osteoporosis   . Unspecified essential hypertension     Past Surgical History:  Procedure Laterality Date  . BREAST LUMPECTOMY     benign  . COLON SURGERY     removal of polyps  . DENTAL SURGERY    . RETINAL TEAR REPAIR CRYOTHERAPY      There were no vitals filed for this visit.   Subjective Assessment - 06/25/20 0936    Subjective Pt still notes some intermittent brief stabbing pain wrapping around L side of ribs at times but pain mostly in mid thoracic spine.    Pertinent History h/o multiple compression fracture (x2); COPD    Patient Stated Goals "learn how to do strengthening exercises for my back so I can be as healthly as I can be"    Currently in Pain? Yes    Pain Score 2    2.5/10   Pain Location Thoracic    Pain Orientation Mid    Pain Descriptors / Indicators Aching    Pain Type Acute pain;Chronic pain    Pain Radiating Towards intermittent stabbing pain wrapping around L side of ribs    Pain Frequency Intermittent                              [facility] Adult PT Treatment/Exercise - 06/25/20 0931      Therapeutic Activites    Therapeutic Activities Other Therapeutic Activities    Other Therapeutic Activities Discussed options for cleaning around house to minimize strain on thoracic spine inlcuding proper use of Swiffer as well as modifiying Swiffer to use to reach shower walls while cleaning bathroom      Neck Exercises: Seated   Other Seated Exercise Thoracic extension/mobilization over back of chair with arms crossed over chest 5 x 10" - cues for cervical retraction for neutral spine while avoiding excessive cervical extension      Lumbar Exercises: Aerobic   UBE (Upper Arm Bike) L1.0 x 6 min (3' fwd/3' back)      Moist Heat Therapy   Number Minutes Moist Heat 12 Minutes    Moist Heat Location Other (comment)   thoracic spine     Manual Therapy   Manual Therapy Soft tissue mobilization;Myofascial release;Scapular mobilization    Manual therapy comments R side lying    Soft  tissue mobilization STM to L pecs, UT/MT/LT, LS, rhomboids, subscapularis & serratus    Myofascial Release manual TPR to L pecs, LS, MT/LT, rhomboids, subscapularis and serratus    Scapular Mobilization R subscapular release and scapular mobs for all directions                    PT Short Term Goals - 06/18/20 1013      PT SHORT TERM GOAL #1   Title Patient will be independent with initial HEP    Status Achieved   06/11/20     PT SHORT TERM GOAL #2   Title Patient will verbalize/demonstrate understanding of neutral spine posture and proper body mechanics to reduce strain on thoracolumbar spine    Status Achieved   06/18/20            PT Long Term Goals - 06/25/20 0938      PT LONG TERM GOAL #1   Title Patient will be independent with ongoing/advanced HEP    Status Partially Met    Target Date 07/13/20      PT LONG TERM GOAL #2   Title Patient to verbalize/demonstrate understanding of appropriate  posture and body mechanics needed for daily activities to reduce risk for osteoporotic fractures    Status Partially Met   06/25/20 - notes improved awareness, but "still has work to do"     PT [place] #3   Title Patient to report pain reduction in frequency and intensity by >/= 50%    Status Partially Met   06/25/20 - 40% improvement   Target Date 07/13/20      PT LONG TERM GOAL #4   Title Patient to improve thoracolumbar AROM to [facility] without pain provocation    Status On-going    Target Date 07/13/20      PT LONG TERM GOAL #5   Title Patient to report ability to perform ADLs, household and yardwork-related tasks without increased pain    Status Partially Met    Target Date 07/13/20                 Plan - 06/25/20 0939    Clinical Impression Statement Yolanda Morris requesting further clarification of proper posture and body mechanics with home cleaning task such as mopping/Swiffering and cleaning the shower in the bathroom, therefore reviewed and demonstrated recommended approach to these activities with pt able to provide return demonstration with mopping/Swiffering. She notes she is still having intermittent pain that seems to spread from under her L scapula around her ribcage to under her L breast. Increased muscle tension and ttp present t/o L periscapular musculature and pecs which was addressed with manual STM, MFR/TPR and scapular mobilization with decrease in ttp and muscle tension noted. Verbally reviewed relevant HEP exercises and stretches to reinforce reduction in muscle tension and concluded session with moist heat application to promote further pain reduction and muscle relaxation.    Personal Factors and Comorbidities Comorbidity 3+;Time since onset of injury/illness/exacerbation;Past/Current Experience;Fitness;Age    Comorbidities COPD with intermittent need fro O2, HTN, osteoporosis s/p vertebral compression fractures x 2, migraines/tension headache, DM-II     Examination-Activity Limitations Bed Mobility;Bend;Lift;Carry;Caring for Others;Locomotion Level;Reach Overhead;Sit;Sleep;Squat;Stand;Transfers    Examination-Participation Restrictions Cleaning;Community Activity;Interpersonal Relationship;Meal Prep;Shop;Yard Work    Rehab Potential Good    PT Frequency 2x / week    PT Duration 6 weeks    PT Treatment/Interventions ADLs/Self Care Home Management;Cryotherapy;Electrical Stimulation;Iontophoresis 70m/ml Dexamethasone;Moist Heat;Ultrasound;Gait training;Functional mobility training;Therapeutic activities;Therapeutic exercise;Balance training;Neuromuscular re-education;Patient/family  education;Manual techniques;Passive range of motion;Dry needling;Taping    PT Next Visit Plan postural flexibility and strengthening; thoracic mobilization and extension; manual therapy and modalites PRN; review & update HEP PRN; review of posture & body mechanics education PRN    PT Home Exercise Plan 8/20 - low doorway & snow angel pec stretches, scap retraction +/- yellow TB, yellow TB rows & horiz ABD    Consulted and Agree with Plan of Care Patient           Patient will benefit from skilled therapeutic intervention in order to improve the following deficits and impairments:  Decreased activity tolerance, Decreased endurance, Decreased knowledge of precautions, Decreased mobility, Decreased range of motion, Decreased safety awareness, Decreased strength, Increased fascial restricitons, Increased muscle spasms, Impaired perceived functional ability, Impaired flexibility, Impaired UE functional use, Improper body mechanics, Postural dysfunction, Pain  Visit Diagnosis: Pain in thoracic spine  Abnormal posture  Muscle weakness (generalized)  Other symptoms and signs involving the musculoskeletal system     Problem List Patient Active Problem List   Diagnosis Date Noted  . Body aches 07/22/2019  . Chronic respiratory failure with hypoxia ([place]) 06/17/2019  .  Allergic rhinitis 11/13/2018  . Shortness of breath 10/23/2018  . COPD exacerbation ([place]) 01/07/2015  . Cough 07/05/2013  . Acute sinusitis 02/12/2013  . Acute bronchitis 07/08/2011  . 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) 01/05/2011    Percival Spanish, PT, MPT 06/25/2020, 1:13 PM  [facility] [address]  [address] [place], [place], [address] Phone: [phone]   Fax:  [phone]  Name: Yolanda Morris MRN: [id number] Date of Birth: 1946/03/02

## 2020-06-29 ENCOUNTER — Ambulatory Visit: Payer: Medicare Other

## 2020-06-29 ENCOUNTER — Other Ambulatory Visit: Payer: Self-pay

## 2020-06-29 VITALS — HR 110

## 2020-06-29 DIAGNOSIS — M6281 Muscle weakness (generalized): Secondary | ICD-10-CM

## 2020-06-29 DIAGNOSIS — R29898 Other symptoms and signs involving the musculoskeletal system: Secondary | ICD-10-CM | POA: Diagnosis not present

## 2020-06-29 DIAGNOSIS — M546 Pain in thoracic spine: Secondary | ICD-10-CM | POA: Diagnosis not present

## 2020-06-29 DIAGNOSIS — R293 Abnormal posture: Secondary | ICD-10-CM

## 2020-06-29 NOTE — Therapy (Signed)
[facility] [address]  [place], [place], [address] Phone: [phone]   Fax:  [phone]  Physical Therapy Treatment  Patient Details  Name: Yolanda Morris MRN: [id number] Date of Birth: 1945/12/17 Referring Provider (PT): [name], MD   Encounter Date: 06/29/2020   PT End of Session - 06/29/20 0940    Visit Number 8    Number of Visits 12    Date for PT Re-Evaluation 07/13/20    Authorization Type Medicare & AARP    PT Start Time [phone]    PT Stop Time 1017    PT Time Calculation (min) 43 min    Activity Tolerance Patient tolerated treatment well    Behavior During Therapy [facility] for tasks assessed/performed           Past Medical History:  Diagnosis Date  . Cancer ([place])    skin  . COPD (chronic obstructive pulmonary disease) ([place])   . DM (diabetes mellitus) ([place])   . Dyspnea    increased activity   . Genital herpes   . Hyperlipidemia   . Migraines   . Osteoporosis   . Unspecified essential hypertension     Past Surgical History:  Procedure Laterality Date  . BREAST LUMPECTOMY     benign  . COLON SURGERY     removal of polyps  . DENTAL SURGERY    . RETINAL TEAR REPAIR CRYOTHERAPY      Vitals:   06/29/20 0937 06/29/20 1003  Pulse: 92 (!) 110  SpO2: 94% 91%     Subjective Assessment - 06/29/20 0937    Subjective Doing ok.    Pertinent History h/o multiple compression fracture (x2); COPD    Patient Stated Goals "learn how to do strengthening exercises for my back so I can be as healthly as I can be"    Currently in Pain? Yes    Pain Score 2     Pain Location Thoracic   and L scapular pain   Pain Orientation Left    Pain Descriptors / Indicators Aching    Pain Type Acute pain;Chronic pain    Pain Frequency Intermittent    Multiple Pain Sites No                             [facility] Adult PT Treatment/Exercise - 06/29/20 0001      Neck Exercises: Machines for  Strengthening   UBE (Upper Arm Bike) Lvl 2.5, 3 min forwards, than 1 min break, 3 min forwards       Neck Exercises: Seated   Money 15 reps;3 secs    Money Limitations yellow TB seated     Other Seated Exercise seated horizontal abduction with yellow TB performed in low ROM (band pulled across chest) x 10 reps       Lumbar Exercises: Aerobic   UBE (Upper Arm Bike) L1.5 x 6 min forwards - break taken for 1 min due to shortness of breath      Lumbar Exercises: Seated   Other Seated Lumbar Exercises Seated pelvic tilt 3" x 10 reps       Lumbar Exercises: Quadruped   [place]/Old Horse 10 reps    Mad[place]/Old Horse Limitations heavy cueing for proper motion - limited ROM       Shoulder Exercises: Standing   Row Both;15 reps;Theraband;Strengthening    Theraband Level (Shoulder Row) Level 1 (Yellow)    Row Limitations  cues for 3" scapular retraction hold       Manual Therapy   Manual Therapy Soft tissue mobilization    Manual therapy comments sitting     Soft tissue mobilization STM to L rhomboids, L infraspinatus, L UT, LS                  PT Education - 06/29/20 1138    Education Details HEP update; LTR    Person(s) Educated Patient    Methods Explanation;Demonstration;Verbal cues;Handout    Comprehension Verbalized understanding;Returned demonstration;Verbal cues required            PT Short Term Goals - 06/18/20 1013      PT SHORT TERM GOAL #1   Title Patient will be independent with initial HEP    Status Achieved   06/11/20     PT SHORT TERM GOAL #2   Title Patient will verbalize/demonstrate understanding of neutral spine posture and proper body mechanics to reduce strain on thoracolumbar spine    Status Achieved   06/18/20            PT Long Term Goals - 06/25/20 0938      PT LONG TERM GOAL #1   Title Patient will be independent with ongoing/advanced HEP    Status Partially Met    Target Date 07/13/20      PT LONG TERM GOAL #2   Title Patient to  verbalize/demonstrate understanding of appropriate posture and body mechanics needed for daily activities to reduce risk for osteoporotic fractures    Status Partially Met   06/25/20 - notes improved awareness, but "still has work to do"     PT [place] #3   Title Patient to report pain reduction in frequency and intensity by >/= 50%    Status Partially Met   06/25/20 - 40% improvement   Target Date 07/13/20      PT LONG TERM GOAL #4   Title Patient to improve thoracolumbar AROM to [facility] without pain provocation    Status On-going    Target Date 07/13/20      PT LONG TERM GOAL #5   Title Patient to report ability to perform ADLs, household and yardwork-related tasks without increased pain    Status Partially Met    Target Date 07/13/20                 Plan - 06/29/20 0958    Clinical Impression Statement Yolanda Morris reporting good improvement in her mid-back pain since starting therapy.  Notes relief from MT last visit.  Able to progress repetitions with postural strengthening today without increased pain.  Did require moderate cueing with thoracolumbar ROM activities initiated today thus HEP updated limited.  ended visit with pt. reporting she would like to defer moist heat to mid back until skin irritation heals.  Pt. feels she irritated the skin on her mid back using the heating pad too long at home.  Pt. cautioned to use electric heating pad on lower temperature settings and to avoid taking naps whole using heat at home to avoid burns.    Comorbidities COPD with intermittent need fro O2, HTN, osteoporosis s/p vertebral compression fractures x 2, migraines/tension headache, DM-II    Rehab Potential Good    PT Frequency 2x / week    PT Treatment/Interventions ADLs/Self Care Home Management;Cryotherapy;Electrical Stimulation;Iontophoresis 37m/ml Dexamethasone;Moist Heat;Ultrasound;Gait training;Functional mobility training;Therapeutic activities;Therapeutic exercise;Balance  training;Neuromuscular re-education;Patient/family education;Manual techniques;Passive range of motion;Dry needling;Taping    PT Next Visit Plan  postural flexibility and strengthening; thoracic mobilization and extension; manual therapy and modalites PRN; review & update HEP PRN; review of posture & body mechanics education PRN    PT Home Exercise Plan 8/20 - low doorway & snow angel pec stretches, scap retraction +/- yellow TB, yellow TB rows & horiz ABD; "no money": 09/13: LTR    Consulted and Agree with Plan of Care Patient           Patient will benefit from skilled therapeutic intervention in order to improve the following deficits and impairments:  Decreased activity tolerance, Decreased endurance, Decreased knowledge of precautions, Decreased mobility, Decreased range of motion, Decreased safety awareness, Decreased strength, Increased fascial restricitons, Increased muscle spasms, Impaired perceived functional ability, Impaired flexibility, Impaired UE functional use, Improper body mechanics, Postural dysfunction, Pain  Visit Diagnosis: Pain in thoracic spine  Abnormal posture  Muscle weakness (generalized)  Other symptoms and signs involving the musculoskeletal system     Problem List Patient Active Problem List   Diagnosis Date Noted  . Body aches 07/22/2019  . Chronic respiratory failure with hypoxia ([place]) 06/17/2019  . Allergic rhinitis 11/13/2018  . Shortness of breath 10/23/2018  . COPD exacerbation ([place]) 01/07/2015  . Cough 07/05/2013  . Acute sinusitis 02/12/2013  . Acute bronchitis 07/08/2011  . 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) 01/05/2011    Bess Harvest, PTA 06/29/20 11:46 AM   [facility] [address]  [place], [place], [address] Phone: [phone]   Fax:  [phone]  Name: Yolanda Morris MRN: [id number] Date of Birth: 1946/05/15

## 2020-07-02 ENCOUNTER — Ambulatory Visit: Payer: Medicare Other | Admitting: Physical Therapy

## 2020-07-02 ENCOUNTER — Other Ambulatory Visit: Payer: Self-pay

## 2020-07-02 ENCOUNTER — Encounter: Payer: Self-pay | Admitting: Physical Therapy

## 2020-07-02 DIAGNOSIS — R29898 Other symptoms and signs involving the musculoskeletal system: Secondary | ICD-10-CM

## 2020-07-02 DIAGNOSIS — R293 Abnormal posture: Secondary | ICD-10-CM | POA: Diagnosis not present

## 2020-07-02 DIAGNOSIS — M546 Pain in thoracic spine: Secondary | ICD-10-CM

## 2020-07-02 DIAGNOSIS — M6281 Muscle weakness (generalized): Secondary | ICD-10-CM | POA: Diagnosis not present

## 2020-07-02 NOTE — Therapy (Addendum)
[facility] [address]  [place], [place], [address] Phone: [phone]   Fax:  [phone]  Physical Therapy Treatment  Patient Details  Name: Yolanda Morris MRN: [id number] Date of Birth: June 13, 1946 Referring Provider (PT): [name], MD   Encounter Date: 07/02/2020   PT End of Session - 07/02/20 0932    Visit Number 9    Number of Visits 12    Date for PT Re-Evaluation 07/13/20    Authorization Type Medicare & AARP    PT Start Time 0932    PT Stop Time 1019    PT Time Calculation (min) 47 min    Activity Tolerance Patient tolerated treatment well    Behavior During Therapy [facility] for tasks assessed/performed           Past Medical History:  Diagnosis Date  . Cancer ([facility])    skin  . COPD (chronic obstructive pulmonary disease) ([place])   . DM (diabetes mellitus) ([place])   . Dyspnea    increased activity   . Genital herpes   . Hyperlipidemia   . Migraines   . Osteoporosis   . Unspecified essential hypertension     Past Surgical History:  Procedure Laterality Date  . BREAST LUMPECTOMY     benign  . COLON SURGERY     removal of polyps  . DENTAL SURGERY    . RETINAL TEAR REPAIR CRYOTHERAPY      There were no vitals filed for this visit.   Subjective Assessment - 07/02/20 0939    Subjective Pt reports she is feeling much better since starting PT and would like to try and wrap up with PT next week.    Pertinent History h/o multiple compression fracture (x2); COPD    Patient Stated Goals "learn how to do strengthening exercises for my back so I can be as healthly as I can be"    Currently in Pain? No/denies                             [facility] Adult PT Treatment/Exercise - 07/02/20 0932      Neck Exercises: Seated   Money 5 reps;10 reps;3 secs   5 reps with yellow TB   Money Limitations with pool noodle along spine in chair to promote scap retraction; pt rerports too strenous  with yellow TB, therefore performed w/o resistance    Other Seated Exercise Thoracic extension/mobilization over back of chair with arms crossed over chest 5 x 10" - cues for cervical retraction for neutral spine while avoiding excessive cervical extension      Lumbar Exercises: Stretches   Lower Trunk Rotation --   10 x 5"   Lower Trunk Rotation Limitations hook lying      Lumbar Exercises: Aerobic   Nustep L4 x 6 min (UE/LE)      Shoulder Exercises: Seated   Horizontal ABduction Both;10 reps;Strengthening;Theraband    Theraband Level (Shoulder Horizontal ABduction) Level 1 (Yellow)    Horizontal ABduction Limitations with pool noodle along spine in chair to promote scap retraction      Shoulder Exercises: Standing   Extension Both;10 reps;Strengthening;Theraband    Theraband Level (Shoulder Extension) Level 2 (Red)    Extension Limitations emphasis on scap retraction    Row Both;10 reps;Strengthening;Theraband    Theraband Level (Shoulder Row) Level 2 (Red)    Row Limitations emphasis on scap retraction    Retraction Both;10  reps;AROM    Retraction Limitations into pool noodle on wall & along doorframe      Shoulder Exercises: Stretch   Corner Stretch 30 seconds;3 reps    Corner Stretch Limitations 3-way doorway pec stretch                  PT Education - 07/02/20 1017    Education Details HEP review & progression    Person(s) Educated Patient    Methods Explanation;Demonstration;Verbal cues;Handout    Comprehension Verbalized understanding;Verbal cues required;Returned demonstration            PT Short Term Goals - 06/18/20 1013      PT SHORT TERM GOAL #1   Title Patient will be independent with initial HEP    Status Achieved   06/11/20     PT SHORT TERM GOAL #2   Title Patient will verbalize/demonstrate understanding of neutral spine posture and proper body mechanics to reduce strain on thoracolumbar spine    Status Achieved   06/18/20            PT  Long Term Goals - 06/25/20 0938      PT LONG TERM GOAL #1   Title Patient will be independent with ongoing/advanced HEP    Status Partially Met    Target Date 07/13/20      PT LONG TERM GOAL #2   Title Patient to verbalize/demonstrate understanding of appropriate posture and body mechanics needed for daily activities to reduce risk for osteoporotic fractures    Status Partially Met   06/25/20 - notes improved awareness, but "still has work to do"     PT [place] #3   Title Patient to report pain reduction in frequency and intensity by >/= 50%    Status Partially Met   06/25/20 - 40% improvement   Target Date 07/13/20      PT LONG TERM GOAL #4   Title Patient to improve thoracolumbar AROM to [facility] without pain provocation    Status On-going    Target Date 07/13/20      PT LONG TERM GOAL #5   Title Patient to report ability to perform ADLs, household and yardwork-related tasks without increased pain    Status Partially Met    Target Date 07/13/20                 Plan - 07/02/20 0941    Clinical Impression Statement Yolanda Morris feels that she is 60% better since starting PT with thoracic back pain decreased and now more intermittent. Pt expressing interest in trying to transition to her HEP next week, therefore today's session focusing on review and update of HEP. Provide instruction and education in progression of current stretches and strengthening program for continued advancement at home, including progression of reps and/or resistance. Will plan for 10th visit reassessment next visit and may proceed with transition to HEP pending status with goals and comfort with HEP.    Comorbidities COPD with intermittent need fro O2, HTN, osteoporosis s/p vertebral compression fractures x 2, migraines/tension headache, DM-II    Rehab Potential Good    PT Frequency 2x / week    PT Duration 6 weeks    PT Treatment/Interventions ADLs/Self Care Home Management;Cryotherapy;Electrical  Stimulation;Iontophoresis 41m/ml Dexamethasone;Moist Heat;Ultrasound;Gait training;Functional mobility training;Therapeutic activities;Therapeutic exercise;Balance training;Neuromuscular re-education;Patient/family education;Manual techniques;Passive range of motion;Dry needling;Taping    PT Next Visit Plan 10th visit FOTO & PN - possible D/C with transition to HEP; postural flexibility and strengthening; thoracic mobilization and  extension; manual therapy and modalites PRN; review & update HEP PRN; review of posture & body mechanics education PRN    PT Home Exercise Plan 8/20 - low doorway & snow angel pec stretches, scap retraction +/- yellow TB, yellow TB rows & horiz ABD; 9/2 - thoracic extension/mobs over back of chair; 9/7 - "no money"; 9/13 - LTR    Consulted and Agree with Plan of Care Patient           Patient will benefit from skilled therapeutic intervention in order to improve the following deficits and impairments:  Decreased activity tolerance, Decreased endurance, Decreased knowledge of precautions, Decreased mobility, Decreased range of motion, Decreased safety awareness, Decreased strength, Increased fascial restricitons, Increased muscle spasms, Impaired perceived functional ability, Impaired flexibility, Impaired UE functional use, Improper body mechanics, Postural dysfunction, Pain  Visit Diagnosis: Pain in thoracic spine  Abnormal posture  Muscle weakness (generalized)  Other symptoms and signs involving the musculoskeletal system     Problem List Patient Active Problem List   Diagnosis Date Noted  . Body aches 07/22/2019  . Chronic respiratory failure with hypoxia ([place]) 06/17/2019  . Allergic rhinitis 11/13/2018  . Shortness of breath 10/23/2018  . COPD exacerbation ([place]) 01/07/2015  . Cough 07/05/2013  . Acute sinusitis 02/12/2013  . Acute bronchitis 07/08/2011  . 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) 01/05/2011    Percival Spanish, PT, MPT 07/02/2020,  2:39 PM  [facility] [address]  [address] [place], [place], [address] Phone: [phone]   Fax:  [phone]  Name: Yolanda Morris MRN: [id number] Date of Birth: 07/27/1946

## 2020-07-02 NOTE — Patient Instructions (Signed)
    Home exercise program created by Saahil Herbster, PT.  For questions, please contact [facility] via phone at [phone] or email at [email or web address]  [facility] [address]  [address] [place], [place], [address] Phone: [phone]   Fax:  [phone]    

## 2020-07-06 ENCOUNTER — Ambulatory Visit: Payer: Medicare Other | Admitting: Physical Therapy

## 2020-07-06 ENCOUNTER — Other Ambulatory Visit: Payer: Self-pay

## 2020-07-06 DIAGNOSIS — R293 Abnormal posture: Secondary | ICD-10-CM | POA: Diagnosis not present

## 2020-07-06 DIAGNOSIS — M546 Pain in thoracic spine: Secondary | ICD-10-CM | POA: Diagnosis not present

## 2020-07-06 DIAGNOSIS — R29898 Other symptoms and signs involving the musculoskeletal system: Secondary | ICD-10-CM

## 2020-07-06 DIAGNOSIS — M6281 Muscle weakness (generalized): Secondary | ICD-10-CM | POA: Diagnosis not present

## 2020-07-06 NOTE — Patient Instructions (Signed)
    Home exercise program created by Lizbet Cirrincione, PT.  For questions, please contact Lacee Grey via phone at [phone] or email at [email or web address]  [facility] [place] [address]  [address] [place], [place], [address] Phone: [phone]   Fax:  [phone]    

## 2020-07-06 NOTE — Therapy (Addendum)
[facility] [place] [address]  [place], [place], [address] Phone: [phone]   Fax:  [phone]  Physical Therapy Treatment / Progress Note / Discharge Summary  Patient Details  Name: Yolanda Morris MRN: [id number] Date of Birth: 1946-04-23 Referring Provider (PT): [name], MD  Progress Note  Reporting Period 06/01/2020 to 07/06/2020  See note below for Objective Data and Assessment of Progress/Goals.      Encounter Date: 07/06/2020   PT End of Session - 07/06/20 0930    Visit Number 10    Number of Visits 12    Date for PT Re-Evaluation 07/13/20    Authorization Type Medicare & AARP    PT Start Time 0930    PT Stop Time 1023    PT Time Calculation (min) 53 min    Activity Tolerance Patient tolerated treatment well    Behavior During Therapy WFL for tasks assessed/performed           Past Medical History:  Diagnosis Date  . Cancer ([place])    skin  . COPD (chronic obstructive pulmonary disease) ([place])   . DM (diabetes mellitus) ([place])   . Dyspnea    increased activity   . Genital herpes   . Hyperlipidemia   . Migraines   . Osteoporosis   . Unspecified essential hypertension     Past Surgical History:  Procedure Laterality Date  . BREAST LUMPECTOMY     benign  . COLON SURGERY     removal of polyps  . DENTAL SURGERY    . RETINAL TEAR REPAIR CRYOTHERAPY      There were no vitals filed for this visit.   Subjective Assessment - 07/06/20 0934    Subjective Pt reports she still feels ready to transtion to the HEP but was wondering about exercises to work on for her legs.    Pertinent History h/o multiple compression fracture (x2); COPD    Patient Stated Goals "learn how to do strengthening exercises for my back so I can be as healthly as I can be"    Currently in Pain? No/denies              [facility] PT Assessment - 07/06/20 0930      Assessment   Medical Diagnosis Mid (lower thoracic)  back pain    Referring Provider (PT) [name], MD    Hand Dominance Right    Next MD Visit Oct 2021      Observation/Other Assessments   Focus on Therapeutic Outcomes (FOTO)  Thoracic - 59% (41% limitation)      AROM   Overall AROM Comments WFL except ~10% limited in R side bend & rotation      Strength   Right Shoulder Flexion 4+/5    Right Shoulder ABduction 4+/5    Right Shoulder Internal Rotation 4+/5    Right Shoulder External Rotation 4/5    Left Shoulder Flexion 4+/5    Left Shoulder ABduction 4+/5    Left Shoulder Internal Rotation 4+/5    Left Shoulder External Rotation 4/5    Right Hip Flexion 4/5    Right Hip Extension 4/5    Right Hip External Rotation  4/5    Right Hip Internal Rotation 4+/5    Right Hip ABduction 4+/5    Right Hip ADduction 4+/5    Left Hip Flexion 4/5    Left Hip Extension 4/5    Left Hip External Rotation 4/5    Left  Hip Internal Rotation 4+/5    Left Hip ABduction 4+/5    Left Hip ADduction 4/5    Right Knee Flexion 4+/5    Right Knee Extension 4+/5    Left Knee Flexion 4/5    Left Knee Extension 4+/5    Right Ankle Dorsiflexion 4+/5    Left Ankle Dorsiflexion 4+/5                         [facility] Adult PT Treatment/Exercise - 07/06/20 0930      Neck Exercises: Machines for Strengthening   UBE (Upper Arm Bike) --      Lumbar Exercises: Stretches   Passive Hamstring Stretch Right;Left;30 seconds;1 rep    Passive Hamstring Stretch Limitations supine with strap & seated hip hinge - pt preferring the former    Hip Flexor Stretch Left;Right;30 seconds;1 rep    Hip Flexor Stretch Limitations seated lunge position over edge of chair    Piriformis Stretch Left;Right;30 seconds;2 reps    Piriformis Stretch Limitations supine KTOS with opp knee flexed and extended    Figure 4 Stretch 30 seconds;2 reps;Supine;Seated;With overpressure    Gastroc Stretch Left;Right;30 seconds;1 rep    Gastroc Stretch Limitations leaning into  counter    Other Lumbar Stretch Exercise L/R soleus stretch leaning into counter x 30 sec      Lumbar Exercises: Aerobic   UBE (Upper Arm Bike) L1.5 x 6 min (3' fwd/3' back with 30 sec rest btw directions)                  PT Education - 07/06/20 1020    Education Details Proximal LE flexibilty HEP    Person(s) Educated Patient    Methods Explanation;Demonstration;Verbal cues    Comprehension Verbalized understanding;Verbal cues required;Returned demonstration            PT Short Term Goals - 06/18/20 1013      PT SHORT TERM GOAL #1   Title Patient will be independent with initial HEP    Status Achieved   06/11/20     PT SHORT TERM GOAL #2   Title Patient will verbalize/demonstrate understanding of neutral spine posture and proper body mechanics to reduce strain on thoracolumbar spine    Status Achieved   06/18/20            PT Long Term Goals - 07/06/20 0937      PT LONG TERM GOAL #1   Title Patient will be independent with ongoing/advanced HEP    Status Achieved   07/06/20     PT LONG TERM GOAL #2   Title Patient to verbalize/demonstrate understanding of appropriate posture and body mechanics needed for daily activities to reduce risk for osteoporotic fractures    Status Achieved   07/06/20     PT LONG TERM GOAL #3   Title Patient to report pain reduction in frequency and intensity by >/= 50%    Status Achieved   07/06/20 - 60-65% improvement     PT LONG TERM GOAL #4   Title Patient to improve thoracolumbar AROM to [facility] without pain provocation    Status Achieved   07/06/20     PT LONG TERM GOAL #5   Title Patient to report ability to perform ADLs, household and yardwork-related tasks without increased pain    Status Achieved   07/06/20                Plan - 07/06/20 2992  Clinical Impression Statement Yolanda Morris feels that she is 60% better since starting PT with thoracic back pain decreased and now only intermittent. All goals now met - thoracic ROM  now essentially [facility] w/o pain provocation and pt able to complete daily task w/o limitation due to pain. She reports good awareness of posture and body mechanics and strives to be more consistent with alignment and technique with daily tasks. She feels confident with current ongoing HEP but did request some training in stretches for LEs to address some tightness and tendency for cramping in LE musculature, therefore instruction provided in appropriate stretches. Yolanda Morris feels ready to try transitioning to the HEP at this time but would like to remain on hold for 30-days in the event that issues arise with the HEP.    Comorbidities COPD with intermittent need fro O2, HTN, osteoporosis s/p vertebral compression fractures x 2, migraines/tension headache, DM-II    Rehab Potential Good    PT Frequency 2x / week    PT Duration 6 weeks    PT Treatment/Interventions ADLs/Self Care Home Management;Cryotherapy;Electrical Stimulation;Iontophoresis 51m/ml Dexamethasone;Moist Heat;Ultrasound;Gait training;Functional mobility training;Therapeutic activities;Therapeutic exercise;Balance training;Neuromuscular re-education;Patient/family education;Manual techniques;Passive range of motion;Dry needling;Taping    PT Next Visit Plan transition to HEP + 30-day hold    PT Home Exercise Plan 8/20 - low doorway & snow angel pec stretches, scap retraction +/- yellow TB, yellow TB rows & horiz ABD; 9/2 - thoracic extension/mobs over back of chair; 9/7 - "no money"; 9/13 - LTR    Consulted and Agree with Plan of Care Patient           Patient will benefit from skilled therapeutic intervention in order to improve the following deficits and impairments:  Decreased activity tolerance, Decreased endurance, Decreased knowledge of precautions, Decreased mobility, Decreased range of motion, Decreased safety awareness, Decreased strength, Increased fascial restricitons, Increased muscle spasms, Impaired perceived functional ability,  Impaired flexibility, Impaired UE functional use, Improper body mechanics, Postural dysfunction, Pain  Visit Diagnosis: Pain in thoracic spine  Abnormal posture  Muscle weakness (generalized)  Other symptoms and signs involving the musculoskeletal system     Problem List Patient Active Problem List   Diagnosis Date Noted  . Body aches 07/22/2019  . Chronic respiratory failure with hypoxia ([place] 06/17/2019  . Allergic rhinitis 11/13/2018  . Shortness of breath 10/23/2018  . COPD exacerbation ([place] 01/07/2015  . Cough 07/05/2013  . Acute sinusitis 02/12/2013  . Acute bronchitis 07/08/2011  . 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) 01/05/2011    JPercival Spanish PT, MPT 07/06/2020, 11:48 AM  [facility] [place] [address]Phone: [phone]  Fax:  [phone] Name: DZenora KarpelMRN: [id number] of Birth: 714-Nov-1947 PHYSICAL THERAPY DISCHARGE SUMMARY  Visits from Start of Care: 10  Current functional level related to goals / functional outcomes:   Refer to above clinical impression for status as of last visit on 07/06/2020. Patient was placed on hold for 30 days and has not needed to return to PT, therefore will proceed with discharge from PT for this episode.   Remaining deficits:   As above.    Education / Equipment:   HEP, pBiomedical scientist Plan: Patient agrees to discharge.  Patient goals were met. Patient is being discharged due to meeting the stated rehab goals.  ?????     JPercival Spanish PT, MPT 08/21/20, 9:11 AM  [facility] [address]  [place], [place], [address] Phone: [phone]   Fax:  [phone]

## 2020-07-08 DIAGNOSIS — J449 Chronic obstructive pulmonary disease, unspecified: Secondary | ICD-10-CM | POA: Diagnosis not present

## 2020-07-08 DIAGNOSIS — M810 Age-related osteoporosis without current pathological fracture: Secondary | ICD-10-CM | POA: Diagnosis not present

## 2020-07-08 DIAGNOSIS — E119 Type 2 diabetes mellitus without complications: Secondary | ICD-10-CM | POA: Diagnosis not present

## 2020-07-08 DIAGNOSIS — E785 Hyperlipidemia, unspecified: Secondary | ICD-10-CM | POA: Diagnosis not present

## 2020-07-08 DIAGNOSIS — I10 Essential (primary) hypertension: Secondary | ICD-10-CM | POA: Diagnosis not present

## 2020-07-08 DIAGNOSIS — E1169 Type 2 diabetes mellitus with other specified complication: Secondary | ICD-10-CM | POA: Diagnosis not present

## 2020-07-09 ENCOUNTER — Encounter: Payer: Medicare Other | Admitting: Physical Therapy

## 2020-07-13 ENCOUNTER — Encounter: Payer: Medicare Other | Admitting: Physical Therapy

## 2020-07-14 DIAGNOSIS — E119 Type 2 diabetes mellitus without complications: Secondary | ICD-10-CM | POA: Diagnosis not present

## 2020-07-16 ENCOUNTER — Encounter: Payer: Medicare Other | Admitting: Physical Therapy

## 2020-07-16 DIAGNOSIS — E119 Type 2 diabetes mellitus without complications: Secondary | ICD-10-CM | POA: Diagnosis not present

## 2020-07-21 ENCOUNTER — Ambulatory Visit (INDEPENDENT_AMBULATORY_CARE_PROVIDER_SITE_OTHER): Payer: Medicare Other | Admitting: Pulmonary Disease

## 2020-07-21 ENCOUNTER — Encounter: Payer: Self-pay | Admitting: Pulmonary Disease

## 2020-07-21 ENCOUNTER — Ambulatory Visit (INDEPENDENT_AMBULATORY_CARE_PROVIDER_SITE_OTHER): Payer: Medicare Other

## 2020-07-21 ENCOUNTER — Other Ambulatory Visit: Payer: Self-pay

## 2020-07-21 VITALS — BP 122/74 | HR 103 | Temp 97.8°F | Ht 63.0 in | Wt 154.6 lb

## 2020-07-21 DIAGNOSIS — J9611 Chronic respiratory failure with hypoxia: Secondary | ICD-10-CM | POA: Diagnosis not present

## 2020-07-21 DIAGNOSIS — Z23 Encounter for immunization: Secondary | ICD-10-CM | POA: Diagnosis not present

## 2020-07-21 DIAGNOSIS — J410 Simple chronic bronchitis: Secondary | ICD-10-CM

## 2020-07-21 DIAGNOSIS — Z79899 Other long term (current) drug therapy: Secondary | ICD-10-CM | POA: Diagnosis not present

## 2020-07-21 DIAGNOSIS — J439 Emphysema, unspecified: Secondary | ICD-10-CM | POA: Diagnosis not present

## 2020-07-21 DIAGNOSIS — Z0000 Encounter for general adult medical examination without abnormal findings: Secondary | ICD-10-CM

## 2020-07-21 DIAGNOSIS — J9811 Atelectasis: Secondary | ICD-10-CM | POA: Diagnosis not present

## 2020-07-21 NOTE — Assessment & Plan Note (Signed)
Plan: Walk today in office Continue Anoro Ellipta Okay to start trial of Trelegy Ellipta, resume Anoro Ellipta after finishing trial Notify our office after trialing Trelegy Ellipta Apply for [place] patient assistance program as you are currently in the donut hole Continue to wear your oxygen with physical exertion to maintain oxygen saturations above 88% Continue nocturnal oxygen Work on increasing overall physical activity Flu vaccine today

## 2020-07-21 NOTE — Progress Notes (Signed)
@Patient  ID: Yolanda Morris, female    DOB: [birth year], 74 y.o.   MRN: [id number]  Chief Complaint  Patient presents with   Follow-up    COPD, cough Simple Bronchitis    Referring provider: [name], MD  HPI:  74 year old female former smoker followed in our office for COPD  Smoker/ Smoking History: Former smoker.  Quit 1998.  50-pack-year smoking history. Maintenance:  Anoro Ellipta Pt of:  Dr. Carlis Abbott.   07/21/2020  - Visit   74 year old female former smoker followed in our office for COPD.  She is established with Dr. Carlis Abbott.  Patient was last seen in June/2021.  Plan of care from that office visit was for to continue to utilize oxygen to maintain oxygen saturations above 88%.  Continue Anoro Ellipta.  Patient was trialed on Trelegy Ellipta at her request.  Patient reports that she received Trelegy Ellipta samples.  She has not started to utilize them yet.  She had concerns about whether or not she could go back on her Anoro as she currently reports that her insurance does not cover Trelegy Ellipta at all.  She is also in the donut hole.  She has seen clinical improvement with using her nebulizer before taking her Anoro Ellipta which was suggested by Dr. Carlis Abbott.  Patient presented to her office today off of oxygen but carrying a portable oxygen concentrator.  She was encouraged at last office visit to utilize oxygen to maintain oxygen saturations above 80%.  Patient was walked in office today and quickly desatted below 88%.  She required 2 L pulsed via POC to maintain oxygen saturations above 88%.  We will discuss this today  Patient would also like her flu vaccine today.  Questionaires / Pulmonary Flowsheets:   ACT:  No flowsheet data found.  MMRC: No flowsheet data found.  Epworth:  No flowsheet data found.  Tests:   Spirometry: PFT's 2011:  FEV1 1.25 (54%), ratio 52, +airtrapping, DLCO 77% [name] 06/2012:  FEV1 1.00 (43%), ratio 51% Spirometry October 2019 FEV1  0.8 L 38% predicted  08/16/2018-chest x-ray-COPD and bibasilar scarring, mild compression fracture mid thoracic vertebral body, new since 2014  08/15/2018-spirometry- FVC 1.5 (56%), ratio 52, FEV1 39  FENO:  No results found for: NITRICOXIDE  PFT: PFT Results Latest Ref Rng & Units 09/09/2019  FVC-Pre L 1.69  FVC-Predicted Pre % 63  FVC-Post L 1.77  FVC-Predicted Post % 66  Pre FEV1/FVC % % 58  Post FEV1/FCV % % 57  FEV1-Pre L 0.99  FEV1-Predicted Pre % 49  FEV1-Post L 1.00  DLCO uncorrected ml/min/mmHg 16.62  DLCO UNC% % 92  DLVA Predicted % 115  TLC L 5.98  TLC % Predicted % 125  RV % Predicted % 185    WALK:  SIX MIN WALK 06/17/2019 06/17/2019 12/06/2018 11/15/2018 08/15/2018  Supplimental Oxygen during Test? (L/min) Yes Yes - - No  O2 Flow Rate 2 2 - - -  Type Pulse Continuous - - -  2 Minute Oxygen Saturation % - - 90 88 -  2 Minute HR - - 123 95 -  4 Minute Oxygen Saturation % - - 86 87 -  4 Minute HR - - 137 112 -  6 Minute Oxygen Saturation % - - 90 87 -  6 Minute HR - - 143 120 -  Tech Comments: - Patient walked 1 lap before O2 dropped to 86% on room air. Patient walked at a leisurely pace. Patient was placed on 2L of O2. Was  able to continue her walk. O2 improved to 95%. SOB sensation went away after she was placed on O2. Denied any CP or leg pain. - - -    Imaging: No results found.  Lab Results:  CBC No results found for: WBC, RBC, HGB, HCT, PLT, MCV, MCH, MCHC, RDW, LYMPHSABS, MONOABS, EOSABS, BASOSABS  BMET    Component Value Date/Time   NA 141 10/23/2018 1226   K 4.4 10/23/2018 1226   CL 100 10/23/2018 1226   CO2 35 (H) 10/23/2018 1226   GLUCOSE 96 10/23/2018 1226   BUN 12 10/23/2018 1226   CREATININE 0.66 10/23/2018 1226   CALCIUM 10.5 10/23/2018 1226    BNP No results found for: BNP  ProBNP    Component Value Date/Time   PROBNP 18.0 10/23/2018 1226    Specialty Problems      Pulmonary Problems   COPD GOLD III B (based on 08/15/18  spiro)    PFT's 2011:  FEV1 1.25 (54%), ratio 52, +airtrapping, DLCO 77% [name] 06/2012:  FEV1 1.00 (43%), ratio 51% Completed pulmonary rehab 2014.   08/15/2018-spirometry- FVC 1.5 (56%), ratio 52, FEV1 39       Acute bronchitis   Acute sinusitis   Cough   COPD exacerbation (H[facility])   Shortness of breath   Allergic rhinitis   Chronic respiratory failure with hypoxia (H[facility])    06/17/2019-walk in office patient qualified for 2 L of O2 with physical exertion         Allergies  Allergen Reactions   Bacitracin     rash   Levofloxacin     hives    Immunization History  Administered Date(s) Administered   Fluad Quad(high Dose 65+) 06/17/2019   Influenza Split 06/18/2011, 07/10/2012, 08/17/2013   Influenza, High Dose Seasonal PF 07/12/2017, 07/21/2020   Influenza,inj,Quad PF,6+ Mos 07/07/2015, 07/11/2016   Influenza-Unspecified 07/16/2014, 07/16/2018   PFIZER SARS-COV-2 Vaccination 11/21/2019, 12/17/2019   Pneumococcal Conjugate-13 12/18/2013   Pneumococcal Polysaccharide-23 07/10/2012   Zoster Recombinat (Shingrix) 12/16/2018, 04/16/2019    Past Medical History:  Diagnosis Date   Cancer ([place])    skin   COPD (chronic obstructive pulmonary disease) ([place])    DM (diabetes mellitus) ([place])    Dyspnea    increased activity    Genital herpes    Hyperlipidemia    Migraines    Osteoporosis    Unspecified essential hypertension     Tobacco History: Social History   Tobacco Use  Smoking Status Former Smoker   Packs/day: 2.00   Years: 25.00   Pack years: 50.00   Types: Cigarettes   Quit date: 10/17/1996   Years since quitting: 23.7  Smokeless Tobacco Never Used   Counseling given: Not Answered   Continue to not smoke  Outpatient Encounter Medications as of 07/21/2020  Medication Sig   acetaminophen (TYLENOL) 325 MG tablet Take 650 mg by mouth every 6 (six) hours as needed. Twice a week   albuterol (PROVENTIL HFA;VENTOLIN HFA) 108 (90 Base)  MCG/ACT inhaler Inhale 2 puffs into the lungs every 6 (six) hours as needed for wheezing or shortness of breath.   albuterol (PROVENTIL) (2.5 MG/3ML) 0.083% nebulizer solution USE 1 VIAL IN NEBULIZER EVERY 6 HOURS AS DIRECTED FOR SHORTNESS OF BREATH/WHEEZING Generic: VENTOLIN   albuterol (VENTOLIN HFA) 108 (90 Base) MCG/ACT inhaler Inhale 2 puffs into the lungs every 6 (six) hours as needed for wheezing or shortness of breath.   aspirin EC 81 MG EC tablet Take 81 mg by mouth daily.  calcium carbonate (OS-CAL - DOSED IN MG OF ELEMENTAL CALCIUM) 1250 (500 Ca) MG tablet Take 1 tablet by mouth daily with breakfast.   cetirizine (ZYRTEC) 10 MG tablet Take 10 mg by mouth daily.   chlorpheniramine (CHLOR-TRIMETON) 4 MG tablet Take 4 mg by mouth daily as needed for allergies.   Cholecalciferol (VITAMIN D3) 1000 UNITS CAPS Take 1 capsule by mouth daily.     Cranberry 500 MG TABS Take 1 tablet by mouth daily.   denosumab (PROLIA) 60 MG/ML SOSY injection Inject 60 mg into the skin every 6 (six) months.   Fluticasone-Umeclidin-Vilant (TRELEGY ELLIPTA) 200-62.5-25 MCG/INH AEPB Inhale 1 puff into the lungs daily.   losartan (COZAAR) 100 MG tablet Take 100 mg by mouth daily.    Lysine 500 MG CAPS Take 1 capsule by mouth daily.     metFORMIN (GLUCOPHAGE-XR) 500 MG 24 hr tablet 500 mg at bedtime.    Multiple Vitamins-Minerals (CENTRUM SILVER PO) Take 1 tablet by mouth daily.     omeprazole (PRILOSEC) 20 MG capsule Take 20 mg by mouth every other day.    simvastatin (ZOCOR) 40 MG tablet Take 40 mg by mouth at bedtime.     Spacer/Aero-Holding Chambers (AEROCHAMBER Z-STAT PLUS) inhaler Use as instructed   valACYclovir (VALTREX) 500 MG tablet Take 500 mg by mouth 2 (two) times daily as needed.     [DISCONTINUED] levocetirizine (XYZAL) 5 MG tablet Take 5 mg by mouth every evening.     [DISCONTINUED] Zoledronic Acid (RECLAST IV) Inject into the vein. Every 12 months (Patient not taking: Reported  on 06/01/2020)   No facility-administered encounter medications on file as of 07/21/2020.     Review of Systems  Review of Systems  Constitutional: Negative for activity change, fatigue and fever.  HENT: Negative for sinus pressure, sinus pain and sore throat.   Respiratory: Positive for shortness of breath (Worsens in the afternoon). Negative for cough and wheezing.   Cardiovascular: Negative for chest pain and palpitations.  Gastrointestinal: Negative for diarrhea, nausea and vomiting.  Musculoskeletal: Negative for arthralgias.  Neurological: Negative for dizziness.  Psychiatric/Behavioral: Negative for sleep disturbance. The patient is not nervous/anxious.      Physical Exam  BP 122/74 (BP Location: Left Arm, Cuff Size: Normal)    Pulse (!) 103    Temp 97.8 F (36.6 C) (Oral)    Ht 5\' 3"  (1.6 m)    Wt 154 lb 9.6 oz (70.1 kg)    SpO2 93%    BMI 27.39 kg/m   Wt Readings from Last 5 Encounters:  07/21/20 154 lb 9.6 oz (70.1 kg)  03/26/20 159 lb 12.8 oz (72.5 kg)  03/02/20 161 lb 12.8 oz (73.4 kg)  09/09/19 166 lb (75.3 kg)  06/17/19 161 lb (73 kg)    BMI Readings from Last 5 Encounters:  07/21/20 27.39 kg/m  03/26/20 28.31 kg/m  03/02/20 28.66 kg/m  09/09/19 30.36 kg/m  06/17/19 29.45 kg/m     Physical Exam Vitals and nursing note reviewed.  Constitutional:      General: She is not in acute distress.    Appearance: Normal appearance. She is normal weight.  HENT:     Head: Normocephalic and atraumatic.     Right Ear: External ear normal.     Left Ear: External ear normal.  Cardiovascular:     Rate and Rhythm: Normal rate and regular rhythm.     Pulses: Normal pulses.     Heart sounds: Normal heart sounds. No murmur heard.  Pulmonary:     Breath sounds: No decreased air movement. No decreased breath sounds, wheezing or rales.  Musculoskeletal:     Cervical back: Normal range of motion.  Skin:    General: Skin is warm and dry.     Capillary Refill:  Capillary refill takes less than 2 seconds.  Neurological:     General: No focal deficit present.     Mental Status: She is alert and oriented to person, place, and time. Mental status is at baseline.     Gait: Gait normal.  Psychiatric:        Mood and Affect: Mood normal.        Behavior: Behavior normal.        Thought Content: Thought content normal.        Judgment: Judgment normal.       Assessment & Plan:   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) Plan: Walk today in office Continue Anoro Ellipta Okay to start trial of Trelegy Ellipta, resume Anoro Ellipta after finishing trial Notify our office after trialing Trelegy Ellipta Apply for [place] patient assistance program as you are currently in the donut hole Continue to wear your oxygen with physical exertion to maintain oxygen saturations above 88% Continue nocturnal oxygen Work on increasing overall physical activity Flu vaccine today  Chronic respiratory failure with hypoxia ([place]) Plan: Continue nocturnal oxygen Walk today in office patient required 2 L pulsed via POC to maintain oxygen saturations above 88% with physical exertion Emphasized need to the patient that she needs to wear oxygen with physical exertion to maintain oxygen saturations above 88%  Medication management Patient currently in donut hole  Plan: Apply for [place] for you patient assistance    Healthcare maintenance Plan: Flu vaccine today    Return in about 4 months (around 11/21/2020), or if symptoms worsen or fail to improve, for NEW PULMONOLOGIST IN 57min SLOT.   Lauraine Rinne, NP 07/21/2020   This appointment required 32 minutes of patient care (this includes precharting, chart review, review of results, face-to-face care, etc.).

## 2020-07-21 NOTE — Patient Instructions (Addendum)
You were seen today by Lauraine Rinne, NP  for:   1. Simple chronic bronchitis ([place])  - DG Chest 2 View; Future  Walk today in office   High dose Flu vaccine today   Trial of Trelegy Ellipta  >>> 1 puff daily in the morning >>>rinse mouth out after use  >>> This inhaler contains 3 medications that help manage her respiratory status, contact our office if you cannot afford this medication or unable to remain on this medication  Then resume:   Anoro Ellipta  >>> Take 1 puff daily in the morning right when you wake up >>>Rinse your mouth out after use >>>This is a daily maintenance inhaler, NOT a rescue inhaler >>>Contact our office if you are having difficulties affording or obtaining this medication >>>It is important for you to be able to take this daily and not miss any doses   Note your daily symptoms > remember "red flags" for COPD:   >>>Increase in cough >>>increase in sputum production >>>increase in shortness of breath or activity  intolerance.   If you notice these symptoms, please call the office to be seen.    2. Chronic respiratory failure with hypoxia (HCC)  Walk today in office >>>required 2L via POC with exertion   Continue oxygen therapy as prescribed  >>>maintain oxygen saturations greater than 88 percent  >>>if unable to maintain oxygen saturations please contact the office  >>>do not smoke with oxygen  >>>can use nasal saline gel or nasal saline rinses to moisturize nose if oxygen causes dryness  Continue oxygen therapy at night  3. Medication management  We will have you apply for patient assistance through [place] to help with your inhaler coverage since you are in the donut hole  If we continue to have issues with accessing medications due to you being the donut hole we can consider referral [facility]  4. Healthcare maintenance  High-dose flu vaccine today  We recommend today:  Orders Placed This Encounter  Procedures   DG  Chest 2 View    Standing Status:   Future    Number of Occurrences:   1    Standing Expiration Date:   11/21/2020    Order Specific Question:   Reason for Exam (SYMPTOM  OR DIAGNOSIS REQUIRED)    Answer:   former smoker    Order Specific Question:   Preferred imaging location?    Answer:   Internal    Order Specific Question:   Radiology Contrast Protocol - do NOT remove file path    Answer:   [email or web address]   Flu vaccine HIGH DOSE PF (Fluzone High dose)   Orders Placed This Encounter  Procedures   DG Chest 2 View   Flu vaccine HIGH DOSE PF (Fluzone High dose)   No orders of the defined types were placed in this encounter.   Follow Up:    Return in about 4 months (around 11/21/2020), or if symptoms worsen or fail to improve, for NEW PULMONOLOGIST IN 32min SLOT. Dr. Erin Fulling, Dr. Silas Flood or Dr. Shearon Stalls.  Notification of test results are managed in the following manner: If there are  any recommendations or changes to the  plan of care discussed in office today,  we will contact you and let you know what they are. If you do not hear from [place], then your results are normal and you can view them through your  MyChart account , or a letter will be sent to you. Thank you  again for trusting [place] with your care  - Thank you, [facility]    It is flu season:   >>> Best ways to protect herself from the flu: Receive the yearly flu vaccine, practice good hand hygiene washing with soap and also using hand sanitizer when available, eat a nutritious meals, get adequate rest, hydrate appropriately       Please contact the office if your symptoms worsen or you have concerns that you are not improving.   Thank you for choosing [facility] for your healthcare, and for allowing [place] to partner with you on your healthcare journey. I am thankful to be able to provide care to you today.   Wyn Quaker FNP-C    Influenza Virus  Vaccine injection What is this medicine? INFLUENZA VIRUS VACCINE (in floo EN zuh VAHY ruhs vak SEEN) helps to reduce the risk of getting influenza also known as the flu. The vaccine only helps protect you against some strains of the flu. This medicine may be used for other purposes; ask your health care provider or pharmacist if you have questions. COMMON BRAND NAME(S): Afluria, Afluria Quadrivalent, Agriflu, Alfuria, FLUAD, Fluarix, Fluarix Quadrivalent, Flublok, Flublok Quadrivalent, FLUCELVAX, FLUCELVAX Quadrivalent, Flulaval, Flulaval Quadrivalent, Fluvirin, Fluzone, Fluzone High-Dose, Fluzone Intradermal, Fluzone Quadrivalent What should I tell my health care provider before I take this medicine? They need to know if you have any of these conditions:  bleeding disorder like hemophilia  fever or infection  Guillain-Barre syndrome or other neurological problems  immune system problems  infection with the human immunodeficiency virus (HIV) or AIDS  low blood platelet counts  multiple sclerosis  an unusual or allergic reaction to influenza virus vaccine, latex, other medicines, foods, dyes, or preservatives. Different brands of vaccines contain different allergens. Some may contain latex or eggs. Talk to your doctor about your allergies to make sure that you get the right vaccine.  pregnant or trying to get pregnant  breast-feeding How should I use this medicine? This vaccine is for injection into a muscle or under the skin. It is given by a health care professional. A copy of Vaccine Information Statements will be given before each vaccination. Read this sheet carefully each time. The sheet may change frequently. Talk to your healthcare provider to see which vaccines are right for you. Some vaccines should not be used in all age groups. Overdosage: If you think you have taken too much of this medicine contact a poison control center or emergency room at once. NOTE: This medicine is  only for you. Do not share this medicine with others. What if I miss a dose? This does not apply. What may interact with this medicine?  chemotherapy or radiation therapy  medicines that lower your immune system like etanercept, anakinra, infliximab, and adalimumab  medicines that treat or prevent blood clots like warfarin  phenytoin  steroid medicines like prednisone or cortisone  theophylline  vaccines This list may not describe all possible interactions. Give your health care provider a list of all the medicines, herbs, non-prescription drugs, or dietary supplements you use. Also tell them if you smoke, drink alcohol, or use illegal drugs. Some items may interact with your medicine. What should I watch for while using this medicine? Report any side effects that do not go away within 3 days to your doctor or health care professional. Call your health care provider if any unusual symptoms occur within 6 weeks of receiving this vaccine. You may still catch the flu, but  the illness is not usually as bad. You cannot get the flu from the vaccine. The vaccine will not protect against colds or other illnesses that may cause fever. The vaccine is needed every year. What side effects may I notice from receiving this medicine? Side effects that you should report to your doctor or health care professional as soon as possible:  allergic reactions like skin rash, itching or hives, swelling of the face, lips, or tongue Side effects that usually do not require medical attention (report to your doctor or health care professional if they continue or are bothersome):  fever  headache  muscle aches and pains  pain, tenderness, redness, or swelling at the injection site  tiredness This list may not describe all possible side effects. Call your doctor for medical advice about side effects. You may report side effects to FDA at 1-800-FDA-1088. Where should I keep my medicine? The vaccine will be  given by a health care professional in a clinic, pharmacy, doctor's office, or other health care setting. You will not be given vaccine doses to store at home. NOTE: This sheet is a summary. It may not cover all possible information. If you have questions about this medicine, talk to your doctor, pharmacist, or health care provider.  2020 Elsevier/Gold Standard (2018-08-28 08:45:43)

## 2020-07-21 NOTE — Assessment & Plan Note (Signed)
Plan: °Flu vaccine today °

## 2020-07-21 NOTE — Assessment & Plan Note (Signed)
Patient currently in donut hole  Plan: Apply for [place] for you patient assistance

## 2020-07-21 NOTE — Assessment & Plan Note (Signed)
Plan: Continue nocturnal oxygen Walk today in office patient required 2 L pulsed via POC to maintain oxygen saturations above 88% with physical exertion Emphasized need to the patient that she needs to wear oxygen with physical exertion to maintain oxygen saturations above 88%

## 2020-07-23 ENCOUNTER — Telehealth: Payer: Self-pay | Admitting: Pulmonary Disease

## 2020-07-23 NOTE — Telephone Encounter (Signed)
Spoke with pt, aware of recs.  Nothing further needed at this time- will close encounter.   

## 2020-07-23 NOTE — Telephone Encounter (Signed)
atc pt, line rang several times then went to fast busy signal.  Wcb.

## 2020-07-23 NOTE — Telephone Encounter (Signed)
Pt returning call.  [phone]

## 2020-07-23 NOTE — Telephone Encounter (Signed)
Pt is calling back - CB# [phone]

## 2020-07-23 NOTE — Telephone Encounter (Signed)
LMTCB x1 for pt.  

## 2020-07-23 NOTE — Telephone Encounter (Signed)
Attempted to call pt but unable to reach. Left message for her to return call. 

## 2020-07-23 NOTE — Telephone Encounter (Signed)
Spoke with pt. States that Dr. Carlis Abbott gave her a sample of Trelegy 200. She has been reading information about this medication and states that she read that if you have COPD you should be using Trelegy 100, Trelegy 200 is for Asthma. Pt is questioning if she should be on the lower dose of Trelegy.  Aaron Edelman - please advise as you were the last one to see the pt. Thank you!

## 2020-07-23 NOTE — Telephone Encounter (Signed)
07/23/2020  Patient was likely given a Trelegy Ellipta 200 sample due to limited availability of Trelegy Ellipta 100 samples.  Is okay for her to be trialed on this.  Patient is correct though if she benefits from [place] 200 then I would recommend that she be maintained on Trelegy Ellipta 100.  As that is traditionally the appropriate dosing for COPD.   Wyn Quaker, FNP

## 2020-07-24 ENCOUNTER — Telehealth: Payer: Self-pay | Admitting: Pulmonary Disease

## 2020-07-24 MED ORDER — TRELEGY ELLIPTA 100-62.5-25 MCG/INH IN AEPB: 1.0000 | INHALATION_SPRAY | Freq: Every day | RESPIRATORY_TRACT | 0 refills | Status: DC

## 2020-07-24 NOTE — Telephone Encounter (Signed)
Spoke with patient. She stated that she had to stop using the Trelegy 200 due to feeling very jittery and nervous but it did help with her breathing. She went back to using the Anoro. She had called her PCP's office and spoke with the pharmacist there. He had recommended that she tries Trelegy 100 but they did not have any samples in stock but have ordered some. We have a few samples here.   Dr. Carlis Abbott, are you ok with her trying a sample of Trelegy 100?

## 2020-07-24 NOTE — Telephone Encounter (Signed)
Sure, thanks!  LPC

## 2020-07-24 NOTE — Telephone Encounter (Signed)
Spoke with patient. She is aware that I have placed an sample of Trelegy up front for her. Verbalized understanding of instructions.   Nothing further needed at time of call.

## 2020-07-30 ENCOUNTER — Other Ambulatory Visit: Payer: Self-pay | Admitting: *Deleted

## 2020-07-30 DIAGNOSIS — Z7984 Long term (current) use of oral hypoglycemic drugs: Secondary | ICD-10-CM | POA: Diagnosis not present

## 2020-07-30 DIAGNOSIS — B009 Herpesviral infection, unspecified: Secondary | ICD-10-CM | POA: Diagnosis not present

## 2020-07-30 DIAGNOSIS — E1169 Type 2 diabetes mellitus with other specified complication: Secondary | ICD-10-CM | POA: Diagnosis not present

## 2020-07-30 DIAGNOSIS — I10 Essential (primary) hypertension: Secondary | ICD-10-CM | POA: Diagnosis not present

## 2020-07-30 DIAGNOSIS — J449 Chronic obstructive pulmonary disease, unspecified: Secondary | ICD-10-CM | POA: Diagnosis not present

## 2020-07-30 MED ORDER — TRELEGY ELLIPTA 100-62.5-25 MCG/INH IN AEPB: 1.0000 | INHALATION_SPRAY | Freq: Every day | RESPIRATORY_TRACT | 11 refills | Status: DC

## 2020-08-04 DIAGNOSIS — Z23 Encounter for immunization: Secondary | ICD-10-CM | POA: Diagnosis not present

## 2020-08-13 DIAGNOSIS — E119 Type 2 diabetes mellitus without complications: Secondary | ICD-10-CM | POA: Diagnosis not present

## 2020-08-24 ENCOUNTER — Other Ambulatory Visit: Payer: Self-pay | Admitting: Family Medicine

## 2020-08-24 DIAGNOSIS — Z1231 Encounter for screening mammogram for malignant neoplasm of breast: Secondary | ICD-10-CM

## 2020-09-03 DIAGNOSIS — M810 Age-related osteoporosis without current pathological fracture: Secondary | ICD-10-CM | POA: Diagnosis not present

## 2020-09-15 DIAGNOSIS — E119 Type 2 diabetes mellitus without complications: Secondary | ICD-10-CM | POA: Diagnosis not present

## 2020-09-21 ENCOUNTER — Other Ambulatory Visit: Payer: Self-pay | Admitting: Critical Care Medicine

## 2020-09-21 DIAGNOSIS — E119 Type 2 diabetes mellitus without complications: Secondary | ICD-10-CM | POA: Diagnosis not present

## 2020-09-21 DIAGNOSIS — J449 Chronic obstructive pulmonary disease, unspecified: Secondary | ICD-10-CM | POA: Diagnosis not present

## 2020-09-21 DIAGNOSIS — E785 Hyperlipidemia, unspecified: Secondary | ICD-10-CM | POA: Diagnosis not present

## 2020-09-21 DIAGNOSIS — I10 Essential (primary) hypertension: Secondary | ICD-10-CM | POA: Diagnosis not present

## 2020-09-21 DIAGNOSIS — M810 Age-related osteoporosis without current pathological fracture: Secondary | ICD-10-CM | POA: Diagnosis not present

## 2020-09-21 DIAGNOSIS — E1169 Type 2 diabetes mellitus with other specified complication: Secondary | ICD-10-CM | POA: Diagnosis not present

## 2020-09-21 DIAGNOSIS — J9611 Chronic respiratory failure with hypoxia: Secondary | ICD-10-CM

## 2020-10-05 ENCOUNTER — Ambulatory Visit: Payer: Medicare Other

## 2020-10-08 ENCOUNTER — Telehealth: Payer: Self-pay | Admitting: Pulmonary Disease

## 2020-10-08 ENCOUNTER — Ambulatory Visit: Payer: Medicare Other

## 2020-10-08 NOTE — Telephone Encounter (Signed)
Spoke with the pt  She states that last wk she had a person come to do some work at her house and he did not wear a mask  She states that she is concerned that he is not vaccinated and wants to get a covid test done to be safe before the holidays  She is not having any symptoms   I spoke with Aaron Edelman about this and he states that if she feels she needs to be tested PCR is the best option  May have a delay in finding testing site so if wants to do an at home test until she can complete PCR that is the best option  Test should be done 5 days after exposure  Pt aware of recommendations and nothing further needed

## 2020-10-14 DIAGNOSIS — E119 Type 2 diabetes mellitus without complications: Secondary | ICD-10-CM | POA: Diagnosis not present

## 2020-10-15 DIAGNOSIS — E119 Type 2 diabetes mellitus without complications: Secondary | ICD-10-CM | POA: Diagnosis not present

## 2020-10-28 ENCOUNTER — Telehealth: Payer: Self-pay | Admitting: Pulmonary Disease

## 2020-10-28 MED ORDER — TRELEGY ELLIPTA 100-62.5-25 MCG/INH IN AEPB: 1.0000 | INHALATION_SPRAY | Freq: Every day | RESPIRATORY_TRACT | 11 refills | Status: DC

## 2020-10-28 NOTE — Telephone Encounter (Signed)
Spoke with pt and advised that refill for Trelegy was sent to pharmacy requested. Pt verbalized understanding. Nothing further needed.

## 2020-11-16 DIAGNOSIS — E119 Type 2 diabetes mellitus without complications: Secondary | ICD-10-CM | POA: Diagnosis not present

## 2020-11-16 DIAGNOSIS — M810 Age-related osteoporosis without current pathological fracture: Secondary | ICD-10-CM | POA: Diagnosis not present

## 2020-11-16 DIAGNOSIS — E1169 Type 2 diabetes mellitus with other specified complication: Secondary | ICD-10-CM | POA: Diagnosis not present

## 2020-11-16 DIAGNOSIS — I10 Essential (primary) hypertension: Secondary | ICD-10-CM | POA: Diagnosis not present

## 2020-11-16 DIAGNOSIS — E785 Hyperlipidemia, unspecified: Secondary | ICD-10-CM | POA: Diagnosis not present

## 2020-11-16 DIAGNOSIS — J449 Chronic obstructive pulmonary disease, unspecified: Secondary | ICD-10-CM | POA: Diagnosis not present

## 2020-11-17 ENCOUNTER — Ambulatory Visit: Payer: Medicare Other

## 2020-11-17 DIAGNOSIS — E119 Type 2 diabetes mellitus without complications: Secondary | ICD-10-CM | POA: Diagnosis not present

## 2020-11-17 DIAGNOSIS — J449 Chronic obstructive pulmonary disease, unspecified: Secondary | ICD-10-CM | POA: Diagnosis not present

## 2020-11-17 DIAGNOSIS — I10 Essential (primary) hypertension: Secondary | ICD-10-CM | POA: Diagnosis not present

## 2020-11-17 DIAGNOSIS — M810 Age-related osteoporosis without current pathological fracture: Secondary | ICD-10-CM | POA: Diagnosis not present

## 2020-11-17 DIAGNOSIS — E1169 Type 2 diabetes mellitus with other specified complication: Secondary | ICD-10-CM | POA: Diagnosis not present

## 2020-11-17 DIAGNOSIS — E785 Hyperlipidemia, unspecified: Secondary | ICD-10-CM | POA: Diagnosis not present

## 2020-11-18 ENCOUNTER — Ambulatory Visit: Payer: Medicare Other | Admitting: Internal Medicine

## 2020-12-11 DIAGNOSIS — E119 Type 2 diabetes mellitus without complications: Secondary | ICD-10-CM | POA: Diagnosis not present

## 2020-12-21 ENCOUNTER — Encounter: Payer: Self-pay | Admitting: Internal Medicine

## 2020-12-21 ENCOUNTER — Other Ambulatory Visit: Payer: Self-pay

## 2020-12-21 ENCOUNTER — Ambulatory Visit (INDEPENDENT_AMBULATORY_CARE_PROVIDER_SITE_OTHER): Payer: Medicare Other | Admitting: Internal Medicine

## 2020-12-21 VITALS — BP 128/60 | HR 88 | Temp 97.1°F | Ht 63.0 in | Wt 151.8 lb

## 2020-12-21 DIAGNOSIS — J9611 Chronic respiratory failure with hypoxia: Secondary | ICD-10-CM | POA: Diagnosis not present

## 2020-12-21 DIAGNOSIS — J449 Chronic obstructive pulmonary disease, unspecified: Secondary | ICD-10-CM

## 2020-12-21 NOTE — Progress Notes (Signed)
Yolanda Morris    [id number]    [birth year]  Primary Care Physician:Smith, Hal Hope, MD Date of Appointment: 12/21/2020 Established Patient Visit  Chief complaint:   Chief Complaint  Patient presents with  . New Patient (Initial Visit)    SOB with exertion.  COPD.       HPI: Yolanda Morris is a 75 y.o. woman with history COPD and COVID infection. On home oxygen 2LNC.  Former patient of Dr. Carlis Abbott. Establishing care witih me today.   Interval Updates: Here with questions about her mask and when it is safe to gather outdoors with unvaccinated people. She would like to go back to church.  She is on oxygen 2LNC. No interval hospitalizations or ED visits.  She notices sometimes she feels sob even if her oxygen levels are within a normal range at home 94%.  She uses her albuterol nebulizer before her trelegy and it seems to help her inhale the medication better.   Lives at home with her dog. Husband passed away in 01/30/2018.  DME [facility]   She has done pulmonary rehabilitation twice and really enjoyed it.   I have reviewed the patient's family social and past medical history and updated as appropriate.   Past Medical History:  Diagnosis Date  . Cancer ([place])    skin  . COPD (chronic obstructive pulmonary disease) ([place])   . DM (diabetes mellitus) ([place])   . Dyspnea    increased activity   . Genital herpes   . Hyperlipidemia   . Migraines   . Osteoporosis   . Unspecified essential hypertension     Past Surgical History:  Procedure Laterality Date  . BREAST LUMPECTOMY     benign  . COLON SURGERY     removal of polyps  . DENTAL SURGERY    . RETINAL TEAR REPAIR CRYOTHERAPY      Family History  Problem Relation Age of Onset  . COPD Mother        deceased  . Heart disease Mother   . Alcohol abuse Brother        deceased  . Alcohol abuse Sister        deceased  . Alcohol abuse Father     Social History   Occupational History  . Occupation: Psychiatrist  Tobacco Use  . Smoking status: Former Smoker    Packs/day: 2.00    Years: 25.00    Pack years: 50.00    Types: Cigarettes    Quit date: 10/17/1996    Years since quitting: 24.1  . Smokeless tobacco: Never Used  Vaping Use  . Vaping Use: Never used  Substance and Sexual Activity  . Alcohol use: No    Alcohol/week: 0.0 standard drinks  . Drug use: No  . Sexual activity: Not on file     Physical Exam: Blood pressure 128/60, pulse 88, temperature (!) 97.1 F (36.2 C), temperature source Temporal, height 5\' 3"  (1.6 m), weight 151 lb 12.8 oz (68.9 kg), SpO2 94 %.  Gen:      No acute distress Lungs:    Diminished bilaterally, no wheezes or crackles CV:         Regular rate and rhythm; no murmurs, rubs, or gallops.  No pedal edema   Data Reviewed: Imaging: I have personally reviewed the chest xray October 2021 which shows hyperinflation   PFTs:  PFT Results Latest Ref Rng & Units 09/09/2019  FVC-Pre L 1.69  FVC-Predicted Pre %  63  FVC-Post L 1.77  FVC-Predicted Post % 66  Pre FEV1/FVC % % 58  Post FEV1/FCV % % 57  FEV1-Pre L 0.99  FEV1-Predicted Pre % 49  FEV1-Post L 1.00  DLCO uncorrected ml/min/mmHg 16.62  DLCO UNC% % 92  DLVA Predicted % 115  TLC L 5.98  TLC % Predicted % 125  RV % Predicted % 185   I have personally reviewed the patient's PFTs and they show severe airflow limitation.   Labs:   Immunization status: Immunization History  Administered Date(s) Administered  . Fluad Quad(high Dose 65+) 06/17/2019  . Influenza Split 06/18/2011, 07/10/2012, 08/17/2013  . Influenza, High Dose Seasonal PF 07/12/2017, 07/21/2020  . Influenza,inj,Quad PF,6+ Mos 07/07/2015, 07/11/2016  . Influenza-Unspecified 07/16/2014, 07/16/2018  . PFIZER(Purple Top)SARS-COV-2 Vaccination 11/21/2019, 12/17/2019, 08/04/2020  . Pneumococcal Conjugate-13 12/18/2013  . Pneumococcal Polysaccharide-23 07/10/2012  . Zoster Recombinat (Shingrix) 12/16/2018, 04/16/2019     Assessment:  COPD with severe airflow limitation FEV1 44% of predicted On home oxygen  Plan/Recommendations: Continue trelegy Continue albuterol nebulizer beforehand, and can take it again in the evenings.  Start exercising with walking, especially early in the morning during cooler weather. We discussed importance of ongoing mask wearing, and that there is good evidence to support one way masking, especially if she will be around unvaccinated people. Recommend wearing mask indoors in church, probably ok to spend time unmasked if outside.   I spent 30 minutes on 12/21/2020 in care of this patient including face to face time and non-face to face time spent charting, review of outside records, and coordination of care.  Return to Care: Return in about 6 months (around 06/23/2021).   Lenice Llamas, MD Pulmonary and [place]

## 2020-12-24 ENCOUNTER — Ambulatory Visit
Admission: RE | Admit: 2020-12-24 | Discharge: 2020-12-24 | Disposition: A | Discharge status: Home / Self Care | Payer: Medicare Other | Source: Ambulatory Visit | Attending: Family Medicine | Admitting: Family Medicine

## 2020-12-24 ENCOUNTER — Other Ambulatory Visit: Payer: Self-pay

## 2020-12-24 DIAGNOSIS — Z1231 Encounter for screening mammogram for malignant neoplasm of breast: Secondary | ICD-10-CM

## 2021-01-14 DIAGNOSIS — E119 Type 2 diabetes mellitus without complications: Secondary | ICD-10-CM | POA: Diagnosis not present

## 2021-02-09 DIAGNOSIS — E785 Hyperlipidemia, unspecified: Secondary | ICD-10-CM | POA: Diagnosis not present

## 2021-02-09 DIAGNOSIS — E1169 Type 2 diabetes mellitus with other specified complication: Secondary | ICD-10-CM | POA: Diagnosis not present

## 2021-02-09 DIAGNOSIS — J449 Chronic obstructive pulmonary disease, unspecified: Secondary | ICD-10-CM | POA: Diagnosis not present

## 2021-02-09 DIAGNOSIS — M810 Age-related osteoporosis without current pathological fracture: Secondary | ICD-10-CM | POA: Diagnosis not present

## 2021-02-09 DIAGNOSIS — E119 Type 2 diabetes mellitus without complications: Secondary | ICD-10-CM | POA: Diagnosis not present

## 2021-02-09 DIAGNOSIS — I10 Essential (primary) hypertension: Secondary | ICD-10-CM | POA: Diagnosis not present

## 2021-02-11 DIAGNOSIS — E119 Type 2 diabetes mellitus without complications: Secondary | ICD-10-CM | POA: Diagnosis not present

## 2021-02-15 DIAGNOSIS — E785 Hyperlipidemia, unspecified: Secondary | ICD-10-CM | POA: Diagnosis not present

## 2021-02-15 DIAGNOSIS — I8393 Asymptomatic varicose veins of bilateral lower extremities: Secondary | ICD-10-CM | POA: Diagnosis not present

## 2021-02-15 DIAGNOSIS — I10 Essential (primary) hypertension: Secondary | ICD-10-CM | POA: Diagnosis not present

## 2021-02-15 DIAGNOSIS — Z1389 Encounter for screening for other disorder: Secondary | ICD-10-CM | POA: Diagnosis not present

## 2021-02-15 DIAGNOSIS — M810 Age-related osteoporosis without current pathological fracture: Secondary | ICD-10-CM | POA: Diagnosis not present

## 2021-02-15 DIAGNOSIS — Z0000 Encounter for general adult medical examination without abnormal findings: Secondary | ICD-10-CM | POA: Diagnosis not present

## 2021-02-15 DIAGNOSIS — J449 Chronic obstructive pulmonary disease, unspecified: Secondary | ICD-10-CM | POA: Diagnosis not present

## 2021-02-15 DIAGNOSIS — Z7984 Long term (current) use of oral hypoglycemic drugs: Secondary | ICD-10-CM | POA: Diagnosis not present

## 2021-02-15 DIAGNOSIS — E1169 Type 2 diabetes mellitus with other specified complication: Secondary | ICD-10-CM | POA: Diagnosis not present

## 2021-02-24 DIAGNOSIS — E1169 Type 2 diabetes mellitus with other specified complication: Secondary | ICD-10-CM | POA: Diagnosis not present

## 2021-02-24 DIAGNOSIS — I10 Essential (primary) hypertension: Secondary | ICD-10-CM | POA: Diagnosis not present

## 2021-02-24 DIAGNOSIS — E119 Type 2 diabetes mellitus without complications: Secondary | ICD-10-CM | POA: Diagnosis not present

## 2021-02-24 DIAGNOSIS — J449 Chronic obstructive pulmonary disease, unspecified: Secondary | ICD-10-CM | POA: Diagnosis not present

## 2021-02-24 DIAGNOSIS — M810 Age-related osteoporosis without current pathological fracture: Secondary | ICD-10-CM | POA: Diagnosis not present

## 2021-02-24 DIAGNOSIS — E785 Hyperlipidemia, unspecified: Secondary | ICD-10-CM | POA: Diagnosis not present

## 2021-03-09 ENCOUNTER — Ambulatory Visit: Payer: Medicare Other

## 2021-03-09 NOTE — Progress Notes (Signed)
   Covid-19 Vaccination Clinic  Name:  Yolanda Morris    MRN: [id number] DOB: 15-Aug-1946  03/09/2021  Yolanda Morris was observed post Covid-19 immunization for 15 minutes without incident. She was provided with Vaccine Information Sheet and instruction to access the V-Safe system.   Yolanda Morris was instructed to call 911 with any severe reactions post vaccine: Marland Kitchen Difficulty breathing  . Swelling of face and throat  . A fast heartbeat  . A bad rash all over body  . Dizziness and weakness

## 2021-03-12 DIAGNOSIS — E119 Type 2 diabetes mellitus without complications: Secondary | ICD-10-CM | POA: Diagnosis not present

## 2021-03-16 ENCOUNTER — Other Ambulatory Visit (HOSPITAL_BASED_OUTPATIENT_CLINIC_OR_DEPARTMENT_OTHER): Payer: Self-pay

## 2021-03-16 DIAGNOSIS — Z23 Encounter for immunization: Secondary | ICD-10-CM | POA: Diagnosis not present

## 2021-03-16 MED ORDER — MODERNA COVID-19 VACCINE 100 MCG/0.5ML IM SUSP
INTRAMUSCULAR | 0 refills | Status: DC
  Filled 2021-03-16: qty 0.25, 1d supply, fill #0

## 2021-04-01 DIAGNOSIS — E1169 Type 2 diabetes mellitus with other specified complication: Secondary | ICD-10-CM | POA: Diagnosis not present

## 2021-04-01 DIAGNOSIS — J449 Chronic obstructive pulmonary disease, unspecified: Secondary | ICD-10-CM | POA: Diagnosis not present

## 2021-04-01 DIAGNOSIS — E119 Type 2 diabetes mellitus without complications: Secondary | ICD-10-CM | POA: Diagnosis not present

## 2021-04-01 DIAGNOSIS — I10 Essential (primary) hypertension: Secondary | ICD-10-CM | POA: Diagnosis not present

## 2021-04-01 DIAGNOSIS — E785 Hyperlipidemia, unspecified: Secondary | ICD-10-CM | POA: Diagnosis not present

## 2021-04-01 DIAGNOSIS — M810 Age-related osteoporosis without current pathological fracture: Secondary | ICD-10-CM | POA: Diagnosis not present

## 2021-04-08 DIAGNOSIS — M810 Age-related osteoporosis without current pathological fracture: Secondary | ICD-10-CM | POA: Diagnosis not present

## 2021-04-12 ENCOUNTER — Other Ambulatory Visit: Payer: Self-pay | Admitting: Internal Medicine

## 2021-04-12 DIAGNOSIS — J432 Centrilobular emphysema: Secondary | ICD-10-CM

## 2021-04-12 DIAGNOSIS — J410 Simple chronic bronchitis: Secondary | ICD-10-CM

## 2021-04-13 ENCOUNTER — Telehealth: Payer: Self-pay | Admitting: Internal Medicine

## 2021-04-13 NOTE — Telephone Encounter (Signed)
Called and spoke with pharmacy at [facility]. She states that they did not receive a fax. Gave a verbal order. Called to let patient know that refills have been called in. Patient verbalized understanding.  Nothing further needed at this time.

## 2021-04-13 NOTE — Telephone Encounter (Signed)
Called and left detailed message on patient voicemail. Advised her that we sent in the refill to the pharmacy yesterday. Advised to call office back if refill is not there.  Nothing further needed at this time.

## 2021-04-14 DIAGNOSIS — E119 Type 2 diabetes mellitus without complications: Secondary | ICD-10-CM | POA: Diagnosis not present

## 2021-05-04 ENCOUNTER — Other Ambulatory Visit: Payer: Self-pay | Admitting: Internal Medicine

## 2021-05-04 DIAGNOSIS — M810 Age-related osteoporosis without current pathological fracture: Secondary | ICD-10-CM | POA: Diagnosis not present

## 2021-05-04 DIAGNOSIS — Z8781 Personal history of (healed) traumatic fracture: Secondary | ICD-10-CM | POA: Diagnosis not present

## 2021-05-04 DIAGNOSIS — Z9889 Other specified postprocedural states: Secondary | ICD-10-CM

## 2021-05-06 DIAGNOSIS — J441 Chronic obstructive pulmonary disease with (acute) exacerbation: Secondary | ICD-10-CM | POA: Diagnosis not present

## 2021-05-06 DIAGNOSIS — R0989 Other specified symptoms and signs involving the circulatory and respiratory systems: Secondary | ICD-10-CM | POA: Diagnosis not present

## 2021-05-06 DIAGNOSIS — B349 Viral infection, unspecified: Secondary | ICD-10-CM | POA: Diagnosis not present

## 2021-05-06 DIAGNOSIS — Z03818 Encounter for observation for suspected exposure to other biological agents ruled out: Secondary | ICD-10-CM | POA: Diagnosis not present

## 2021-05-07 ENCOUNTER — Telehealth: Payer: Self-pay | Admitting: Internal Medicine

## 2021-05-07 MED ORDER — TRELEGY ELLIPTA 100-62.5-25 MCG/INH IN AEPB: 1.0000 | INHALATION_SPRAY | Freq: Every day | RESPIRATORY_TRACT | 3 refills | Status: DC

## 2021-05-07 NOTE — Telephone Encounter (Signed)
Called and spoke with patient, she states that she just went into the coverage gap and is applying for financial assistance.  She printed out the application and is waiting on the statement about home much she has paid out of pocket for her medications.  Once she receives that, she will bring everything in so we can fax it to [place].  I advised her to write Dr. Mauricio Po name on the front of the paperwork so when she brings it in the front staff will know to put it in her box so we will get it.  She verbalized understanding.  She said she went to [facility] yesterday and she is having an exacerbation of her COPD.  She is on Prednisone and an antibiotic and the prednisone is already helping and she is starting to feel better.  Advised to let us know if she needs anything further.  She said she would call us if the meds she is on does not clear up this exacerbation.    I will leave this note open until we receive her application and she receives her medication.

## 2021-05-13 ENCOUNTER — Telehealth: Payer: Self-pay | Admitting: *Deleted

## 2021-05-13 DIAGNOSIS — I10 Essential (primary) hypertension: Secondary | ICD-10-CM | POA: Diagnosis not present

## 2021-05-13 DIAGNOSIS — E119 Type 2 diabetes mellitus without complications: Secondary | ICD-10-CM | POA: Diagnosis not present

## 2021-05-13 DIAGNOSIS — E785 Hyperlipidemia, unspecified: Secondary | ICD-10-CM | POA: Diagnosis not present

## 2021-05-13 DIAGNOSIS — E1169 Type 2 diabetes mellitus with other specified complication: Secondary | ICD-10-CM | POA: Diagnosis not present

## 2021-05-13 DIAGNOSIS — J449 Chronic obstructive pulmonary disease, unspecified: Secondary | ICD-10-CM | POA: Diagnosis not present

## 2021-05-13 DIAGNOSIS — J441 Chronic obstructive pulmonary disease with (acute) exacerbation: Secondary | ICD-10-CM | POA: Diagnosis not present

## 2021-05-13 DIAGNOSIS — M810 Age-related osteoporosis without current pathological fracture: Secondary | ICD-10-CM | POA: Diagnosis not present

## 2021-05-13 NOTE — Telephone Encounter (Signed)
Spoke with patient, she states she will bring her proof of income by the office so we can fax her application.  She states she will bring it by the office today between 12:30 am and 1 pm.

## 2021-05-13 NOTE — Telephone Encounter (Signed)
Documents faxed to [place]:  application, copy of insurance cards, pharmacy out of pocket expense, proof of income and script.  Received verification that fax was sent successfully.  Packet put in Desai's cabinet.

## 2021-05-13 NOTE — Telephone Encounter (Signed)
ATC patient regarding her patient assistance application.  LVM to return call.  We need to know her household size and we need proof of income when she returns call.

## 2021-05-13 NOTE — Telephone Encounter (Signed)
Pt stated that she dropped off the form yesterday in office and was just wanting to make sure the form was received and when ready to notify her (I did inform the pt that we generally will call her when it is ready to be picked up). Pls regard; [phone]

## 2021-05-13 NOTE — Telephone Encounter (Signed)
Heather, please advise if you have received the paperwork that pt dropped off?

## 2021-05-13 NOTE — Telephone Encounter (Signed)
Received patient's portion of application this morning as well as out of pocket expense from her pharmacies and insurance information.  Called and left a message for for patient regarding household size and need for proof of income for application.

## 2021-05-24 ENCOUNTER — Telehealth: Payer: Self-pay | Admitting: Internal Medicine

## 2021-05-24 NOTE — Telephone Encounter (Signed)
Call returned to patient, patient was currently in lobby getting paperwork faxed and stated she no longer needed anything.   Will close encounter.

## 2021-06-03 DIAGNOSIS — E785 Hyperlipidemia, unspecified: Secondary | ICD-10-CM | POA: Diagnosis not present

## 2021-06-03 DIAGNOSIS — M810 Age-related osteoporosis without current pathological fracture: Secondary | ICD-10-CM | POA: Diagnosis not present

## 2021-06-03 DIAGNOSIS — J449 Chronic obstructive pulmonary disease, unspecified: Secondary | ICD-10-CM | POA: Diagnosis not present

## 2021-06-03 DIAGNOSIS — E1169 Type 2 diabetes mellitus with other specified complication: Secondary | ICD-10-CM | POA: Diagnosis not present

## 2021-06-03 DIAGNOSIS — J441 Chronic obstructive pulmonary disease with (acute) exacerbation: Secondary | ICD-10-CM | POA: Diagnosis not present

## 2021-06-03 DIAGNOSIS — E119 Type 2 diabetes mellitus without complications: Secondary | ICD-10-CM | POA: Diagnosis not present

## 2021-06-03 DIAGNOSIS — I10 Essential (primary) hypertension: Secondary | ICD-10-CM | POA: Diagnosis not present

## 2021-06-11 DIAGNOSIS — R42 Dizziness and giddiness: Secondary | ICD-10-CM | POA: Diagnosis not present

## 2021-06-11 DIAGNOSIS — H938X3 Other specified disorders of ear, bilateral: Secondary | ICD-10-CM | POA: Diagnosis not present

## 2021-06-11 DIAGNOSIS — R350 Frequency of micturition: Secondary | ICD-10-CM | POA: Diagnosis not present

## 2021-06-11 DIAGNOSIS — H6121 Impacted cerumen, right ear: Secondary | ICD-10-CM | POA: Diagnosis not present

## 2021-06-15 ENCOUNTER — Other Ambulatory Visit (HOSPITAL_BASED_OUTPATIENT_CLINIC_OR_DEPARTMENT_OTHER): Payer: Self-pay

## 2021-06-15 DIAGNOSIS — R42 Dizziness and giddiness: Secondary | ICD-10-CM | POA: Diagnosis not present

## 2021-06-15 DIAGNOSIS — E119 Type 2 diabetes mellitus without complications: Secondary | ICD-10-CM | POA: Diagnosis not present

## 2021-06-30 ENCOUNTER — Other Ambulatory Visit: Payer: Self-pay | Admitting: Gastroenterology

## 2021-06-30 ENCOUNTER — Ambulatory Visit (INDEPENDENT_AMBULATORY_CARE_PROVIDER_SITE_OTHER): Payer: Medicare Other | Admitting: Internal Medicine

## 2021-06-30 ENCOUNTER — Other Ambulatory Visit: Payer: Self-pay

## 2021-06-30 ENCOUNTER — Encounter: Payer: Self-pay | Admitting: Internal Medicine

## 2021-06-30 VITALS — BP 146/80 | HR 93 | Temp 97.9°F | Ht 63.0 in | Wt 152.4 lb

## 2021-06-30 DIAGNOSIS — J9611 Chronic respiratory failure with hypoxia: Secondary | ICD-10-CM

## 2021-06-30 DIAGNOSIS — J449 Chronic obstructive pulmonary disease, unspecified: Secondary | ICD-10-CM

## 2021-06-30 DIAGNOSIS — Z23 Encounter for immunization: Secondary | ICD-10-CM | POA: Diagnosis not present

## 2021-06-30 NOTE — Progress Notes (Signed)
Yolanda Morris    [id number]    [birth year]  Primary Care Physician:Smith, Hal Hope, MD Date of Appointment: 06/30/2021 Established Patient Visit  Chief complaint:   Chief Complaint  Patient presents with   Follow-up    Pt states vertigo couple weeks ago.     HPI: Yolanda Morris is a 75 y.o. woman with history COPD and COVID infection. On home oxygen 2LNC.    Interval Updates: No interval hospitalizations or ED visits. Had some vertigo which improved with cleaning out ear canals and starting flonase. No issues with trelegy. No thrush. Oxygen saturations usually 94-95% on [facility]    Lives at home with her dog. Husband passed away in 2018/01/19.  DME [facility]   She has done pulmonary rehabilitation twice and really enjoyed it.   Due to have colonoscopy again for pre-cancerous polyps  I have reviewed the patient's family social and past medical history and updated as appropriate.   Past Medical History:  Diagnosis Date   Cancer ([place])    skin   COPD (chronic obstructive pulmonary disease) (HCC)    DM (diabetes mellitus) ([place])    Dyspnea    increased activity    Genital herpes    Hyperlipidemia    Migraines    Osteoporosis    Unspecified essential hypertension     Past Surgical History:  Procedure Laterality Date   BREAST BIOPSY     BREAST EXCISIONAL BIOPSY     over 20 years ago- benign   COLON SURGERY     removal of polyps   DENTAL SURGERY     RETINAL TEAR REPAIR CRYOTHERAPY      Family History  Problem Relation Age of Onset   COPD Mother        deceased   Heart disease Mother    Alcohol abuse Brother        deceased   Alcohol abuse Sister        deceased   Alcohol abuse Father     Social History   Occupational History   Occupation: Development worker, community  Tobacco Use   Smoking status: Former    Packs/day: 2.00    Years: 25.00    Pack years: 50.00    Types: Cigarettes    Quit date: 10/17/1996    Years since quitting: 24.7    Smokeless tobacco: Never  Vaping Use   Vaping Use: Never used  Substance and Sexual Activity   Alcohol use: No    Alcohol/week: 0.0 standard drinks   Drug use: No   Sexual activity: Not on file     Physical Exam: Blood pressure (!) 146/80, pulse 93, temperature 97.9 F (36.6 C), temperature source Oral, height '5\' 3"'$  (1.6 m), weight 152 lb 6.4 oz (69.1 kg), SpO2 90 %.  Gen:      No acute distress, on nasal cannula Lungs:    diminished bilaterally, no wheezes or crackles, no increased wob CV:         Regular rate and rhythm; no murmurs, rubs, or gallops. No edema   Data Reviewed: Imaging: I have personally reviewed the chest xray October 2021 which shows hyperinflation   PFTs:   PFT Results Latest Ref Rng & Units 09/09/2019  FVC-Pre L 1.69  FVC-Predicted Pre % 63  FVC-Post L 1.77  FVC-Predicted Post % 66  Pre FEV1/FVC % % 58  Post FEV1/FCV % % 57  FEV1-Pre L 0.99  FEV1-Predicted Pre % 49  FEV1-Post  L 1.00  DLCO uncorrected ml/min/mmHg 16.62  DLCO UNC% % 92  DLVA Predicted % 115  TLC L 5.98  TLC % Predicted % 125  RV % Predicted % 185   I have personally reviewed the patient's PFTs and they show severe airflow limitation.   Labs:   Immunization status: Immunization History  Administered Date(s) Administered   Fluad Quad(high Dose 65+) 06/17/2019   Influenza Split 06/18/2011, 07/10/2012, 08/17/2013   Influenza, High Dose Seasonal PF 07/12/2017, 07/21/2020   Influenza,inj,Quad PF,6+ Mos 07/07/2015, 07/11/2016   Influenza-Unspecified 07/16/2014, 07/16/2018   Moderna SARS-COV2 Booster Vaccination 03/09/2021   PFIZER(Purple Top)SARS-COV-2 Vaccination 11/21/2019, 12/17/2019, 08/04/2020   Pneumococcal Conjugate-13 12/18/2013   Pneumococcal Polysaccharide-23 07/10/2012   Zoster Recombinat (Shingrix) 12/16/2018, 04/16/2019    Assessment:  COPD with severe airflow limitation FEV1 44% of predicted On home oxygen History of tobacco use  disorder  Plan/Recommendations: Continue trelegy Continue albuterol nebulizer beforehand, and can take it again in the evenings.  Continue home oxygen  She is out of the window for lung cancer screening - quit in 1998.   Return to Care: Return in about 6 months (around 12/28/2021).   Lenice Llamas, MD [facility] [place]

## 2021-06-30 NOTE — Patient Instructions (Addendum)
Please schedule follow up scheduled with myself in 6 months.  If my schedule is not open yet, we will contact you with a reminder closer to that time.  Continue trelegy inhaler and albuterol as needed   Flonase -  Instructions for use: If you also use a saline nasal spray or rinse, use that first. Position the head with the chin slightly tucked. Use the right hand to spray into the left nostril and the right hand to spray into the left nostril.   Point the bottle away from the septum of your nose (cartilage that divides the two sides of your nose).  Hold the nostril closed on the opposite side from where you will spray Spray once and gently sniff to pull the medicine into the higher parts of your nose.  Don't sniff too hard as the medicine will drain down the back of your throat instead. Repeat with a second spray on the same side if prescribed. Repeat on the other side of your nose.

## 2021-07-01 ENCOUNTER — Encounter: Payer: Self-pay | Admitting: Cardiology

## 2021-07-01 ENCOUNTER — Ambulatory Visit: Payer: Medicare Other | Admitting: Cardiology

## 2021-07-01 VITALS — BP 162/85 | HR 98 | Resp 16 | Ht 63.0 in | Wt 152.0 lb

## 2021-07-01 DIAGNOSIS — E782 Mixed hyperlipidemia: Secondary | ICD-10-CM | POA: Diagnosis not present

## 2021-07-01 DIAGNOSIS — R9431 Abnormal electrocardiogram [ECG] [EKG]: Secondary | ICD-10-CM

## 2021-07-01 DIAGNOSIS — E119 Type 2 diabetes mellitus without complications: Secondary | ICD-10-CM

## 2021-07-01 DIAGNOSIS — I10 Essential (primary) hypertension: Secondary | ICD-10-CM | POA: Diagnosis not present

## 2021-07-01 DIAGNOSIS — R072 Precordial pain: Secondary | ICD-10-CM

## 2021-07-01 DIAGNOSIS — I951 Orthostatic hypotension: Secondary | ICD-10-CM

## 2021-07-01 DIAGNOSIS — R42 Dizziness and giddiness: Secondary | ICD-10-CM

## 2021-07-01 NOTE — Progress Notes (Signed)
Date:  07/01/2021   ID:  Niel Hummer, DOB 1946-05-14, MRN [id number]  PCP:  [name], MD  Cardiologist:  Rex Kras, DO, [facility] (established care 07/01/21 )  REASON FOR CONSULT: Dizziness  REQUESTING PHYSICIAN:  [name], MD [address] [place],   [id number]  Chief Complaint  Patient presents with   Dizziness   New Patient (Initial Visit)    Chest discomfort    HPI  Yolanda Morris is a 75 y.o. female who presents to the office with a chief complaint of " evaluation of dizziness and episodic chest discomfort." Patient's past medical history and cardiovascular risk factors include: Hypertension, COPD on home oxygen 2L , diabetes, hyperlipidemia, genital herpes, history of migraines, former smoker, postmenopausal female, advance age.   She is referred to the office at the request of [name], MD for evaluation of dizziness and chest discomfort.  Patient recently went to a walk-in clinic couple weeks ago due to progressive lightheaded and dizziness which was more noticeable with bending forward and turning her head side to side.  She also has a history of vertigo.  She was noted to have earwax and fluid behind the eardrum on the right side.  After removal of the earwax her dizziness has improved but not completely resolved.  In addition, patient also states that occasionally she gets chest discomfort, located anteriorly, describes it as a tightness like sensation, intensity 4 out of 10, lasting for a few minutes, nonradiating, no improving or worsening factors, not brought on by effort related activities and does not resolve with rest.  The discomfort is usually self-limited.  Cardiovascular risk factors include hypertension, hyperlipidemia, non-insulin-dependent diabetes, former smoker, postmenopausal female.  No prior cardiovascular testing.  At today's evaluation patient is noted to have orthostatic hypotension and also complains of  having varicose veins in the bilateral lower extremities.    FUNCTIONAL STATUS: No structured exercise program or daily routine.   ALLERGIES: Allergies  Allergen Reactions   Bacitracin     rash   Levofloxacin     hives    MEDICATION LIST PRIOR TO VISIT: Current Meds  Medication Sig   acetaminophen (TYLENOL) 325 MG tablet Take 650 mg by mouth every 6 (six) hours as needed. Twice a week   albuterol (PROVENTIL HFA;VENTOLIN HFA) 108 (90 Base) MCG/ACT inhaler Inhale 2 puffs into the lungs every 6 (six) hours as needed for wheezing or shortness of breath.   albuterol (PROVENTIL) (2.5 MG/3ML) 0.083% nebulizer solution USE 1 VIAL IN NEBULIZER EVERY 6 HOURS AS DIRECTED FOR SHORT BREATH/ WHEEZE. Generic: Ventolin   aspirin EC 81 MG EC tablet Take 81 mg by mouth daily. Once weekly   calcium carbonate (OS-CAL) 600 MG TABS tablet Take 1 tablet by mouth daily with breakfast.   cetirizine (ZYRTEC) 10 MG tablet Take 10 mg by mouth daily.   Cholecalciferol (VITAMIN D3) 1000 UNITS CAPS Take 1 capsule by mouth daily.     Cranberry 500 MG TABS Take 1 tablet by mouth daily.   denosumab (PROLIA) 60 MG/ML SOSY injection Inject 60 mg into the skin every 6 (six) months.   Fluticasone-Umeclidin-Vilant (TRELEGY ELLIPTA) 100-62.5-25 MCG/INH AEPB Inhale 1 puff into the lungs daily.   IGLUCOSE TEST STRIPS test strip See admin instructions.   Lancets (STERILANCE TL) MISC See admin instructions.   losartan (COZAAR) 100 MG tablet Take 100 mg by mouth daily.    Lysine 500 MG CAPS Take 1 capsule by mouth daily.  Melatonin 5 MG CAPS Take 1 capsule by mouth at bedtime.   metFORMIN (GLUCOPHAGE-XR) 500 MG 24 hr tablet 500 mg at bedtime.    Multiple Vitamins-Minerals (CENTRUM SILVER PO) Take 1 tablet by mouth daily.     omeprazole (PRILOSEC) 20 MG capsule Take 20 mg by mouth every other day.    OXYGEN Inhale into the lungs. 1-6 liters as directed   simvastatin (ZOCOR) 40 MG tablet Take 40 mg by mouth at bedtime.      triamcinolone cream (KENALOG) 0.5 % Apply 1 application topically 2 (two) times daily.   valACYclovir (VALTREX) 500 MG tablet Take 500 mg by mouth 2 (two) times daily as needed.       PAST MEDICAL HISTORY: Past Medical History:  Diagnosis Date   Cancer ([place])    skin   COPD (chronic obstructive pulmonary disease) (HCC)    DM (diabetes mellitus) ([place])    Dyspnea    increased activity    Genital herpes    Hyperlipidemia    Migraines    Osteoporosis    Unspecified essential hypertension     PAST SURGICAL HISTORY: Past Surgical History:  Procedure Laterality Date   BREAST BIOPSY     BREAST EXCISIONAL BIOPSY     over 20 years ago- benign   COLON SURGERY     removal of polyps   DENTAL SURGERY     RETINAL TEAR REPAIR CRYOTHERAPY      FAMILY HISTORY: The patient family history includes Alcohol abuse in her brother, father, and sister; COPD in her mother; Heart attack in her sister; Heart disease in her mother.  SOCIAL HISTORY:  The patient  reports that she quit smoking about 24 years ago. Her smoking use included cigarettes. She has a 50.00 pack-year smoking history. She has never used smokeless tobacco. She reports that she does not drink alcohol and does not use drugs.  REVIEW OF SYSTEMS: Review of Systems  Constitutional: Negative for chills and fever.  HENT:  Positive for ear pain. Negative for hoarse voice and nosebleeds.   Eyes:  Negative for discharge, double vision and pain.  Cardiovascular:  Positive for chest pain (chest discomfort). Negative for claudication, dyspnea on exertion, leg swelling, near-syncope, orthopnea, palpitations, paroxysmal nocturnal dyspnea and syncope.  Respiratory:  Positive for shortness of breath (chronic and stable). Negative for hemoptysis.   Musculoskeletal:  Negative for muscle cramps and myalgias.  Gastrointestinal:  Negative for abdominal pain, constipation, diarrhea, hematemesis, hematochezia, melena, nausea and vomiting.  Neurological:   Positive for dizziness, light-headedness and vertigo.   PHYSICAL EXAM: Vitals with BMI 07/01/2021 06/30/2021 12/21/2020  Height '5\' 3"'$  '5\' 3"'$  '5\' 3"'$   Weight 152 lbs 152 lbs 6 oz 151 lbs 13 oz  BMI 0000000 27 Q000111Q  Systolic 0000000 123456 0000000  Diastolic 85 80 60  Pulse 98 93 88   Orthostatic VS for the past 72 hrs (Last 3 readings):  Orthostatic BP Patient Position BP Location Cuff Size Orthostatic Pulse  07/01/21 0854 (!) 139/94 Standing Left Arm Normal 98  07/01/21 0853 168/83 Sitting Left Arm Normal 96  07/01/21 0852 142/85 Supine Left Arm Normal 103   CONSTITUTIONAL: Well-developed and well-nourished. No acute distress.  SKIN: Skin is warm and dry. No rash noted. No cyanosis. No pallor. No jaundice HEAD: Normocephalic and atraumatic.  EYES: No scleral icterus MOUTH/THROAT: Moist oral membranes.  NECK: No JVD present. No thyromegaly noted. No carotid bruits  LYMPHATIC: No visible cervical adenopathy.  CHEST Normal respiratory effort. No intercostal  retractions  LUNGS: Decreased breath sounds bilaterally, poor inspiratory effort, kyphosis noted in the posterior chest wall no stridor. No wheezes. No rales.  CARDIOVASCULAR: Regular, positive Q000111Q, soft holosystolic murmur, no gallops or rubs ABDOMINAL: No apparent ascites.  EXTREMITIES: No peripheral edema  HEMATOLOGIC: No significant bruising NEUROLOGIC: Oriented to person, place, and time. Nonfocal. Normal muscle tone.  PSYCHIATRIC: Normal mood and affect. Normal behavior. Cooperative  CARDIAC DATABASE: EKG: 07/01/2021: Sinus tachycardia 102 bpm, normal axis, nonspecific ST depressions,without underlying injury pattern.   Echocardiogram: No results found for this or any previous visit from the past 1095 days.   Stress Testing: No results found for this or any previous visit from the past 1095 days.  Heart Catheterization: None  LABORATORY DATA: No flowsheet data found.  CMP Latest Ref Rng & Units 10/23/2018  Glucose 70 - 99 mg/dL 96   BUN 6 - 23 mg/dL 12  Creatinine 0.40 - 1.20 mg/dL 0.66  Sodium 135 - 145 mEq/L 141  Potassium 3.5 - 5.1 mEq/L 4.4  Chloride 96 - 112 mEq/L 100  CO2 19 - 32 mEq/L 35(H)  Calcium 8.4 - 10.5 mg/dL 10.5    Lipid Panel  No results found for: CHOL, TRIG, HDL, CHOLHDL, VLDL, LDLCALC, LDLDIRECT, LABVLDL  No components found for: NTPROBNP No results for input(s): PROBNP in the last 8760 hours. No results for input(s): TSH in the last 8760 hours.  BMP No results for input(s): NA, K, CL, CO2, GLUCOSE, BUN, CREATININE, CALCIUM, GFRNONAA, GFRAA in the last 8760 hours.  HEMOGLOBIN A1C No results found for: HGBA1C, MPG  External Labs: Collected: 06/12/2019.  Provided by the referring physician. Sodium 143, potassium 4.7, chloride 103, bicarb 28, BUN 9, creatinine 0.6 AST 18, ALT 16, alkaline phosphatase 75 Hemoglobin 14.7 g/dL, hematocrit 44.8%  IMPRESSION:    ICD-10-CM   1. Dizziness  R42 EKG 12-Lead    PCV CAROTID DUPLEX (BILATERAL)    2. Orthostatic hypotension  I95.1     3. Precordial chest pain  R07.2 PCV ECHOCARDIOGRAM COMPLETE    PCV MYOCARDIAL PERFUSION WITH LEXISCAN    4. Nonspecific abnormal electrocardiogram (ECG) (EKG)  R94.31 PCV MYOCARDIAL PERFUSION WITH LEXISCAN    5. Benign hypertension  I10 PCV ECHOCARDIOGRAM COMPLETE    6. Type 2 diabetes mellitus without complication, without long-term current use of insulin (HCC)  E11.9     7. Mixed hyperlipidemia  E78.2        RECOMMENDATIONS: KUMIKO SAUTTER is a 75 y.o. female whose past medical history and cardiac risk factors include: Hypertension, COPD on home oxygen 2L [place], diabetes, hyperlipidemia, genital herpes, history of migraines, former smoker, postmenopausal female, advance age.   Dizziness Multifactorial: She does have orthostatic hypotension, symptoms suggestive of BPPV, and prior history of vertigo and varicose veins Recommend keeping her self well-hydrated. Given her blood pressures at the office she  needs to be on antihypertensive medications.  However would avoid diuretic therapy and vasodilators.  For now I am recommending the patient to take losartan 50 mg p.o. twice daily to help prevent orthostasis. Patient is also advised to see ENT physician to be further evaluated for middle ear pathology given her recent cerumen impaction and fluid behind tympanic membrane which was noted at the recent walk-in clinic. Given her varicose veins and recommending that she use compression stockings to help prevent pooling of blood with prolonged standing. Given the lightheaded and giddiness we will check a carotid duplex to rule out carotid artery atherosclerosis. Patient is educated on  fall prevention.  Orthostatic hypotension See above  Precordial chest pain & nonspecific EKG findings: Both at today's office visit and the EKG provided by the referring provider notes normal sinus rhythm with nonspecific ST depressions.  In the setting of diabetes and elevated estimated 10-year risk of ASCVD recommend an ischemic evaluation.  Given her orthostatics hypotension and risk of falls recommend nuclear stress test to evaluate for reversible ischemia.  Benign hypertension Blood pressures are currently being managed by primary team. Currently on losartan 100 mg p.o. every morning. Recommending that she take losartan 50 mg p.o. twice daily to see if it helps her symptoms of orthostasis/lightheaded/dizziness.  Type 2 diabetes mellitus without complication, without long-term current use of insulin ([place]) Educated on the importance of glycemic control. Patient is currently on ARB as well as statin therapy.  Mixed hyperlipidemia Currently on simvastatin.   She denies myalgia or other side effects. Most recent lipids not available for review. Currently managed by primary care provider.    FINAL MEDICATION LIST END OF ENCOUNTER: No orders of the defined types were placed in this encounter.   Medications  Discontinued During This Encounter  Medication Reason   albuterol (VENTOLIN HFA) 108 (90 Base) MCG/ACT inhaler Duplicate   chlorpheniramine (CHLOR-TRIMETON) 4 MG tablet Error   COVID-19 mRNA vaccine, Moderna, (MODERNA COVID-19 VACCINE) 100 MCG/0.5ML injection Error   Fluticasone-Umeclidin-Vilant (TRELEGY ELLIPTA) 100-62.5-25 MCG/INH AEPB Duplicate   Spacer/Aero-Holding Chambers (AEROCHAMBER Z-STAT PLUS) inhaler Error     Current Outpatient Medications:    acetaminophen (TYLENOL) 325 MG tablet, Take 650 mg by mouth every 6 (six) hours as needed. Twice a week, Disp: , Rfl:    albuterol (PROVENTIL HFA;VENTOLIN HFA) 108 (90 Base) MCG/ACT inhaler, Inhale 2 puffs into the lungs every 6 (six) hours as needed for wheezing or shortness of breath., Disp: 3 Inhaler, Rfl: 3   albuterol (PROVENTIL) (2.5 MG/3ML) 0.083% nebulizer solution, USE 1 VIAL IN NEBULIZER EVERY 6 HOURS AS DIRECTED FOR SHORT BREATH/ WHEEZE. Generic: Ventolin, Disp: 60 mL, Rfl: 10   aspirin EC 81 MG EC tablet, Take 81 mg by mouth daily. Once weekly, Disp: , Rfl:    calcium carbonate (OS-CAL) 600 MG TABS tablet, Take 1 tablet by mouth daily with breakfast., Disp: , Rfl:    cetirizine (ZYRTEC) 10 MG tablet, Take 10 mg by mouth daily., Disp: , Rfl:    Cholecalciferol (VITAMIN D3) 1000 UNITS CAPS, Take 1 capsule by mouth daily.  , Disp: , Rfl:    Cranberry 500 MG TABS, Take 1 tablet by mouth daily., Disp: , Rfl:    denosumab (PROLIA) 60 MG/ML SOSY injection, Inject 60 mg into the skin every 6 (six) months., Disp: , Rfl:    Fluticasone-Umeclidin-Vilant (TRELEGY ELLIPTA) 100-62.5-25 MCG/INH AEPB, Inhale 1 puff into the lungs daily., Disp: 90 each, Rfl: 3   IGLUCOSE TEST STRIPS test strip, See admin instructions., Disp: , Rfl:    Lancets (STERILANCE TL) MISC, See admin instructions., Disp: , Rfl:    losartan (COZAAR) 100 MG tablet, Take 100 mg by mouth daily. , Disp: , Rfl:    Lysine 500 MG CAPS, Take 1 capsule by mouth daily.  , Disp: , Rfl:     Melatonin 5 MG CAPS, Take 1 capsule by mouth at bedtime., Disp: , Rfl:    metFORMIN (GLUCOPHAGE-XR) 500 MG 24 hr tablet, 500 mg at bedtime. , Disp: , Rfl:    Multiple Vitamins-Minerals (CENTRUM SILVER PO), Take 1 tablet by mouth daily.  , Disp: , Rfl:  omeprazole (PRILOSEC) 20 MG capsule, Take 20 mg by mouth every other day. , Disp: , Rfl:    OXYGEN, Inhale into the lungs. 1-6 liters as directed, Disp: , Rfl:    simvastatin (ZOCOR) 40 MG tablet, Take 40 mg by mouth at bedtime.  , Disp: , Rfl:    triamcinolone cream (KENALOG) 0.5 %, Apply 1 application topically 2 (two) times daily., Disp: , Rfl:    valACYclovir (VALTREX) 500 MG tablet, Take 500 mg by mouth 2 (two) times daily as needed.  , Disp: , Rfl:   Orders Placed This Encounter  Procedures   PCV MYOCARDIAL PERFUSION WITH LEXISCAN   EKG 12-Lead   PCV ECHOCARDIOGRAM COMPLETE   PCV CAROTID DUPLEX (BILATERAL)    There are no Patient Instructions on file for this visit.   --Continue cardiac medications as reconciled in final medication list. --No follow-ups on file. Or sooner if needed. --Continue follow-up with your primary care physician regarding the management of your other chronic comorbid conditions.  Patient's questions and concerns were addressed to her satisfaction. She voices understanding of the instructions provided during this encounter.   This note was created using a voice recognition software as a result there may be grammatical errors inadvertently enclosed that do not reflect the nature of this encounter. Every attempt is made to correct such errors.  Rex Kras, [place], [facility]  Pager: [phone] Office: [phone]

## 2021-07-07 ENCOUNTER — Telehealth: Payer: Self-pay | Admitting: Internal Medicine

## 2021-07-08 NOTE — Telephone Encounter (Signed)
Pt called to check the status of POC order to [facility]. [phone]

## 2021-07-08 NOTE — Telephone Encounter (Signed)
Spoke with the pt   She has POC from [facility] now, but they have a new one that is more light weight that she wants  Are you okay with [place] ordering this for her? Please advise thanks!

## 2021-07-09 NOTE — Telephone Encounter (Signed)
Pt calling back

## 2021-07-12 DIAGNOSIS — J9611 Chronic respiratory failure with hypoxia: Secondary | ICD-10-CM

## 2021-07-12 DIAGNOSIS — J449 Chronic obstructive pulmonary disease, unspecified: Secondary | ICD-10-CM

## 2021-07-12 NOTE — Telephone Encounter (Signed)
Dr. Shearon Stalls, please see mychart messages sent by pt and advise.  Below is another one sent by pt as well: [place]ke, [facility] (supporting Spero Geralds, MD) 1 hour ago (8:51 AM)   Dr. Shearon Stalls, I found out last week that the [facility] office of [employer] handles deliveries of equipment for my residence.  I just wanted to give you the phone number & fax number for them. I looked them up...   [place] [phone] phone - [phone] fax - [phone]

## 2021-07-12 NOTE — Telephone Encounter (Signed)
Great can we please prescribe POC to [facility] for her?

## 2021-07-14 ENCOUNTER — Other Ambulatory Visit: Payer: Self-pay

## 2021-07-14 ENCOUNTER — Ambulatory Visit: Payer: Medicare Other

## 2021-07-14 DIAGNOSIS — R072 Precordial pain: Secondary | ICD-10-CM | POA: Diagnosis not present

## 2021-07-14 DIAGNOSIS — R9431 Abnormal electrocardiogram [ECG] [EKG]: Secondary | ICD-10-CM

## 2021-07-16 LAB — PCV MYOCARDIAL PERFUSION WITH LEXISCAN: ST Depression (mm): 0 mm

## 2021-07-20 ENCOUNTER — Ambulatory Visit: Payer: Medicare Other | Attending: Internal Medicine

## 2021-07-20 DIAGNOSIS — Z23 Encounter for immunization: Secondary | ICD-10-CM

## 2021-07-20 NOTE — Progress Notes (Signed)
   Covid-19 Vaccination Clinic  Name:  Yolanda Morris    MRN: [id number] DOB: [birth year]  07/20/2021  [name] was observed post Covid-19 immunization for 15 minutes without incident. She was provided with Vaccine Information Sheet and instruction to access the V-Safe system.   [name] was instructed to call 911 with any severe reactions post vaccine: Difficulty breathing  Swelling of face and throat  A fast heartbeat  A bad rash all over body  Dizziness and weakness

## 2021-07-22 NOTE — Telephone Encounter (Signed)
This has been addressed. See phone note 07/12/21.   Nothing further needed at this time.

## 2021-07-27 ENCOUNTER — Other Ambulatory Visit: Payer: Self-pay

## 2021-07-27 ENCOUNTER — Ambulatory Visit: Payer: Medicare Other

## 2021-07-27 ENCOUNTER — Other Ambulatory Visit: Payer: Medicare Other

## 2021-07-27 DIAGNOSIS — R42 Dizziness and giddiness: Secondary | ICD-10-CM | POA: Diagnosis not present

## 2021-07-27 DIAGNOSIS — I10 Essential (primary) hypertension: Secondary | ICD-10-CM

## 2021-07-27 DIAGNOSIS — R072 Precordial pain: Secondary | ICD-10-CM

## 2021-07-30 ENCOUNTER — Other Ambulatory Visit (HOSPITAL_BASED_OUTPATIENT_CLINIC_OR_DEPARTMENT_OTHER): Payer: Self-pay

## 2021-07-30 DIAGNOSIS — Z23 Encounter for immunization: Secondary | ICD-10-CM | POA: Diagnosis not present

## 2021-07-30 MED ORDER — COVID-19MRNA BIVAL VACC PFIZER 30 MCG/0.3ML IM SUSP
INTRAMUSCULAR | 0 refills | Status: DC
  Filled 2021-07-30: qty 0.3, 1d supply, fill #0

## 2021-08-01 ENCOUNTER — Other Ambulatory Visit: Payer: Self-pay | Admitting: Cardiology

## 2021-08-01 DIAGNOSIS — I6522 Occlusion and stenosis of left carotid artery: Secondary | ICD-10-CM

## 2021-08-05 ENCOUNTER — Encounter: Payer: Self-pay | Admitting: Cardiology

## 2021-08-05 ENCOUNTER — Other Ambulatory Visit: Payer: Self-pay

## 2021-08-05 ENCOUNTER — Ambulatory Visit: Payer: Medicare Other | Admitting: Cardiology

## 2021-08-05 VITALS — BP 145/78 | HR 85 | Temp 97.9°F | Resp 16 | Ht 63.0 in | Wt 154.8 lb

## 2021-08-05 DIAGNOSIS — E119 Type 2 diabetes mellitus without complications: Secondary | ICD-10-CM

## 2021-08-05 DIAGNOSIS — I6522 Occlusion and stenosis of left carotid artery: Secondary | ICD-10-CM | POA: Diagnosis not present

## 2021-08-05 DIAGNOSIS — I10 Essential (primary) hypertension: Secondary | ICD-10-CM

## 2021-08-05 DIAGNOSIS — E782 Mixed hyperlipidemia: Secondary | ICD-10-CM | POA: Diagnosis not present

## 2021-08-05 DIAGNOSIS — R072 Precordial pain: Secondary | ICD-10-CM

## 2021-08-05 MED ORDER — ROSUVASTATIN CALCIUM 10 MG PO TABS: 10.0000 mg | ORAL_TABLET | Freq: Every evening | ORAL | 0 refills | Status: DC

## 2021-08-05 NOTE — Progress Notes (Signed)
ID:  Yolanda Morris, DOB 27-Sep-1946, MRN [id number]  PCP:  [name], MD  Cardiologist:  Rex Kras, DO, [facility] (established care 07/01/21 )  Date: 08/05/21 Last Office Visit: 07/01/2021  Chief Complaint  Patient presents with   Results   Follow-up    HPI  Yolanda Morris is a 75 y.o. female who presents to the office with a chief complaint of " reevaluation of chest pain and discuss test results." Patient's past medical history and cardiovascular risk factors include: Left carotid artery stenosis, hypertension, COPD on home oxygen 2L [place], diabetes, hyperlipidemia, genital herpes, history of migraines, former smoker, postmenopausal female, advance age.   She is referred to the office at the request of [name], MD for evaluation of dizziness and chest discomfort.  During initial consultation she voiced concerns with regards to precordial pain which appeared to be noncardiac based on symptoms.  However due to multiple cardiovascular risk factors including advanced age, hypertension, hyperlipidemia, diabetes, former smoker, and postmenopausal state that shared decision was to proceed with ischemic evaluation.  She underwent an echocardiogram and stress test results reviewed with her in great detail and noted below for further reference.  Clinically she continues to have chest discomfort, she describes this as a squeezing-like sensation, brought on by exposure to cold/windy weather, at times brought on by effort related activities.  Patient states that she is supposed to be using around-the-clock nasal cannula oxygen but chooses not to due to the equipment that she currently has.  Patient states that her symptoms may be related to underlying hypoxia and her heartburn.  Patient was also complaining of dizziness during the last office visit, her orthostatic vital signs were positive and therefore was recommended to decrease her losartan.  This is significantly helped her symptoms  and she has not been dizzy since last office visit.  In the work-up she did have a carotid duplex which notes asymptomatic left carotid artery stenosis as outlined below.   FUNCTIONAL STATUS: No structured exercise program or daily routine.   ALLERGIES: Allergies  Allergen Reactions   Bacitracin     rash   Levofloxacin     hives    MEDICATION LIST PRIOR TO VISIT: Current Meds  Medication Sig   acetaminophen (TYLENOL) 325 MG tablet Take 650 mg by mouth every 6 (six) hours as needed. Twice a week   albuterol (PROVENTIL HFA;VENTOLIN HFA) 108 (90 Base) MCG/ACT inhaler Inhale 2 puffs into the lungs every 6 (six) hours as needed for wheezing or shortness of breath.   aspirin EC 81 MG EC tablet Take 81 mg by mouth once a week. Once weekly   CALCIUM CITRATE PO Take by mouth.   cetirizine (ZYRTEC) 10 MG tablet Take 10 mg by mouth daily.   Cholecalciferol (VITAMIN D3) 1000 UNITS CAPS Take 1 capsule by mouth daily.     COVID-19 mRNA bivalent vaccine, Pfizer, injection Inject into the muscle.   Cranberry 500 MG TABS Take 1 tablet by mouth daily.   denosumab (PROLIA) 60 MG/ML SOSY injection Inject 60 mg into the skin every 6 (six) months.   Fluticasone-Umeclidin-Vilant (TRELEGY ELLIPTA) 100-62.5-25 MCG/INH AEPB Inhale 1 puff into the lungs daily.   IGLUCOSE TEST STRIPS test strip See admin instructions.   Lancets (STERILANCE TL) MISC See admin instructions.   losartan (COZAAR) 100 MG tablet Take 50 mg by mouth 2 (two) times daily.   Lysine 500 MG CAPS Take 1 capsule by mouth daily.     Melatonin  5 MG CAPS Take 1 capsule by mouth at bedtime.   metFORMIN (GLUCOPHAGE-XR) 500 MG 24 hr tablet Take 500 mg by mouth in the morning and at bedtime.   Multiple Vitamins-Minerals (CENTRUM SILVER PO) Take 1 tablet by mouth daily.     omeprazole (PRILOSEC) 20 MG capsule Take 20 mg by mouth every other day.    OXYGEN Inhale into the lungs. 1-6 liters as directed   rosuvastatin (CRESTOR) 10 MG tablet Take 1  tablet (10 mg total) by mouth at bedtime.   triamcinolone cream (KENALOG) 0.5 % Apply 1 application topically 2 (two) times daily.   valACYclovir (VALTREX) 500 MG tablet Take 500 mg by mouth 2 (two) times daily as needed.     [DISCONTINUED] simvastatin (ZOCOR) 40 MG tablet Take 40 mg by mouth at bedtime.       PAST MEDICAL HISTORY: Past Medical History:  Diagnosis Date   Cancer ([place])    skin   COPD (chronic obstructive pulmonary disease) (HCC)    DM (diabetes mellitus) ([place])    Dyspnea    increased activity    Genital herpes    Hyperlipidemia    Migraines    Osteoporosis    Unspecified essential hypertension     PAST SURGICAL HISTORY: Past Surgical History:  Procedure Laterality Date   BREAST BIOPSY     BREAST EXCISIONAL BIOPSY     over 20 years ago- benign   COLON SURGERY     removal of polyps   DENTAL SURGERY     RETINAL TEAR REPAIR CRYOTHERAPY      FAMILY HISTORY: The patient family history includes Alcohol abuse in her brother, father, and sister; COPD in her mother; Heart attack in her sister; Heart disease in her mother.  SOCIAL HISTORY:  The patient  reports that she quit smoking about 24 years ago. Her smoking use included cigarettes. She has a 50.00 pack-year smoking history. She has never used smokeless tobacco. She reports that she does not drink alcohol and does not use drugs.  REVIEW OF SYSTEMS: Review of Systems  Constitutional: Negative for chills and fever.  HENT:  Positive for ear pain. Negative for hoarse voice and nosebleeds.   Eyes:  Negative for discharge, double vision and pain.  Cardiovascular:  Positive for chest pain (chest discomfort). Negative for claudication, dyspnea on exertion, leg swelling, near-syncope, orthopnea, palpitations, paroxysmal nocturnal dyspnea and syncope.  Respiratory:  Positive for shortness of breath (chronic and stable). Negative for hemoptysis.   Musculoskeletal:  Negative for muscle cramps and myalgias.   Gastrointestinal:  Negative for abdominal pain, constipation, diarrhea, hematemesis, hematochezia, melena, nausea and vomiting.  Neurological:  Positive for dizziness, light-headedness and vertigo.   PHYSICAL EXAM: Vitals with BMI 08/05/2021 07/01/2021 06/30/2021  Height 5' 3"  5' 3"  5' 3"   Weight 154 lbs 13 oz 152 lbs 152 lbs 6 oz  BMI 67.01 41.03 27  Systolic 013 143 888  Diastolic 78 85 80  Pulse 85 98 93   No data found.  CONSTITUTIONAL: Well-developed and well-nourished. No acute distress.  SKIN: Skin is warm and dry. No rash noted. No cyanosis. No pallor. No jaundice HEAD: Normocephalic and atraumatic.  EYES: No scleral icterus MOUTH/THROAT: Moist oral membranes.  NECK: No JVD present. No thyromegaly noted. No carotid bruits  LYMPHATIC: No visible cervical adenopathy.  CHEST Normal respiratory effort. No intercostal retractions  LUNGS: Decreased breath sounds bilaterally, poor inspiratory effort, kyphosis noted in the posterior chest wall no stridor. No wheezes. No rales.  CARDIOVASCULAR: Regular, positive X5-M8, soft holosystolic murmur, no gallops or rubs ABDOMINAL: No apparent ascites.  EXTREMITIES: No peripheral edema  HEMATOLOGIC: No significant bruising NEUROLOGIC: Oriented to person, place, and time. Nonfocal. Normal muscle tone.  PSYCHIATRIC: Normal mood and affect. Normal behavior. Cooperative  CARDIAC DATABASE: EKG: 07/01/2021: Sinus tachycardia 102 bpm, normal axis, nonspecific ST depressions,without underlying injury pattern.   Echocardiogram: 07/27/2021: Left ventricle cavity is normal in size. Mild concentric hypertrophy of the left ventricle. Normal global wall motion.  Normal LV systolic function with EF 61%. Doppler evidence of grade I (impaired) diastolic dysfunction, normal LAP.  Mild (Grade I) mitral regurgitation. Estimated right atrial pressure 8 mmHg.   Stress Testing: Modified Bruce/Lexiscan Tetrofosmin stress test 07/14/2021: Lexiscan/modified  Bruce nuclear stress test performed using 1-day protocol. Patient achieved 2.2 METS on modified Bruce protocol and reached 108% MPHR. Stress EKG at 108% MPHR showed sinus tachycardia, no ST-T wave abnormality. Normal myocardial perfusion. Stress LVEF 63%. Low risk study.  Heart Catheterization: None  Carotid artery duplex 07/27/2021:  Duplex suggests stenosis in the right internal carotid artery (1-15%).  Duplex suggests stenosis in the right external carotid artery (<50%). Duplex suggests stenosis in the left internal carotid artery (16-49%).  Duplex suggests stenosis in the left external carotid artery.  There is homogenous plaque in the right carotid artery and heterogeneous plaque in the left carotid artery. Antegrade right vertebral artery flow. Antegrade left vertebral artery flow. Follow up in one year is appropriate if clinically indicated.  LABORATORY DATA: No flowsheet data found.  CMP Latest Ref Rng & Units 10/23/2018  Glucose 70 - 99 mg/dL 96  BUN 6 - 23 mg/dL 12  Creatinine 0.40 - 1.20 mg/dL 0.66  Sodium 135 - 145 mEq/L 141  Potassium 3.5 - 5.1 mEq/L 4.4  Chloride 96 - 112 mEq/L 100  CO2 19 - 32 mEq/L 35(H)  Calcium 8.4 - 10.5 mg/dL 10.5    Lipid Panel  No results found for: CHOL, TRIG, HDL, CHOLHDL, VLDL, LDLCALC, LDLDIRECT, LABVLDL  No components found for: NTPROBNP No results for input(s): PROBNP in the last 8760 hours. No results for input(s): TSH in the last 8760 hours.  BMP No results for input(s): NA, K, CL, CO2, GLUCOSE, BUN, CREATININE, CALCIUM, GFRNONAA, GFRAA in the last 8760 hours.  HEMOGLOBIN A1C No results found for: HGBA1C, MPG  External Labs: Collected: 06/12/2019.  Provided by the referring physician. Sodium 143, potassium 4.7, chloride 103, bicarb 28, BUN 9, creatinine 0.6 AST 18, ALT 16, alkaline phosphatase 75 Hemoglobin 14.7 g/dL, hematocrit 44.8%  IMPRESSION:    ICD-10-CM   1. Precordial chest pain  R07.2     2. Stenosis of left  internal carotid artery  I65.22 rosuvastatin (CRESTOR) 10 MG tablet    Lipid Panel With LDL/HDL Ratio    LDL cholesterol, direct    CMP14+EGFR    3. Mixed hyperlipidemia  E78.2 rosuvastatin (CRESTOR) 10 MG tablet    Lipid Panel With LDL/HDL Ratio    LDL cholesterol, direct    CMP14+EGFR    4. Benign hypertension  I10     5. Type 2 diabetes mellitus without complication, without long-term current use of insulin (HCC)  E11.9         RECOMMENDATIONS: YARIANA HOAGLUND is a 75 y.o. female whose past medical history and cardiac risk factors include: Left carotid artery stenosis, hypertension, COPD on home oxygen 2L [place], diabetes, hyperlipidemia, genital herpes, history of migraines, former smoker, postmenopausal female, advance age.   Precordial chest  pain Patient's symptoms of chest pain appears to be possible cardiac in etiology but may be stemming from supply demand ischemia due to underlying hypoxia as she is not using her nasal cannula oxygen as prescribed. Echo: Preserved LVEF, grade 1 diastolic impairment, no significant valvular heart disease. Stress test: Low risk study We discussed proceeding with additional cardiovascular testing to further evaluate for CAD; however, the shared decision was to manage her risk factors and she will make every effort to be on her prescribed dose of oxygen therapy and to reevaluate her symptoms. Will closely monitor. In the interim, if the symptoms worsen in intensity, frequency, duration, or has typical chest pain she is asked to go to the closest ER via EMS for further evaluation and management.  Stenosis of left internal carotid artery Left ICA stenosis per carotid duplex noted to be less than 50%. 1 year study has been ordered to reevaluate disease progression. Will discontinue simvastatin. Start Crestor.  Medication profile discussed.  We will check fasting lipid profile and CMP in 6 weeks to reevaluate therapy  Mixed hyperlipidemia As  discussed above  Benign hypertension Blood pressure within acceptable range.  Losartan was reduced at the last visit due to orthostatic hypotension.  Medications reconciled.  Low salt diet.   Type 2 diabetes mellitus without complication, without long-term current use of insulin ([place]) Educated on the importance of glycemic control. Patient is currently on ARB as well as statin therapy  FINAL MEDICATION LIST END OF ENCOUNTER: Meds ordered this encounter  Medications   rosuvastatin (CRESTOR) 10 MG tablet    Sig: Take 1 tablet (10 mg total) by mouth at bedtime.    Dispense:  90 tablet    Refill:  0     Medications Discontinued During This Encounter  Medication Reason   albuterol (PROVENTIL) (2.5 MG/3ML) 0.083% nebulizer solution Error   calcium carbonate (OS-CAL) 600 MG TABS tablet Error   simvastatin (ZOCOR) 40 MG tablet Change in therapy     Current Outpatient Medications:    acetaminophen (TYLENOL) 325 MG tablet, Take 650 mg by mouth every 6 (six) hours as needed. Twice a week, Disp: , Rfl:    albuterol (PROVENTIL HFA;VENTOLIN HFA) 108 (90 Base) MCG/ACT inhaler, Inhale 2 puffs into the lungs every 6 (six) hours as needed for wheezing or shortness of breath., Disp: 3 Inhaler, Rfl: 3   aspirin EC 81 MG EC tablet, Take 81 mg by mouth once a week. Once weekly, Disp: , Rfl:    CALCIUM CITRATE PO, Take by mouth., Disp: , Rfl:    cetirizine (ZYRTEC) 10 MG tablet, Take 10 mg by mouth daily., Disp: , Rfl:    Cholecalciferol (VITAMIN D3) 1000 UNITS CAPS, Take 1 capsule by mouth daily.  , Disp: , Rfl:    COVID-19 mRNA bivalent vaccine, Pfizer, injection, Inject into the muscle., Disp: 0.3 mL, Rfl: 0   Cranberry 500 MG TABS, Take 1 tablet by mouth daily., Disp: , Rfl:    denosumab (PROLIA) 60 MG/ML SOSY injection, Inject 60 mg into the skin every 6 (six) months., Disp: , Rfl:    Fluticasone-Umeclidin-Vilant (TRELEGY ELLIPTA) 100-62.5-25 MCG/INH AEPB, Inhale 1 puff into the lungs daily.,  Disp: 90 each, Rfl: 3   IGLUCOSE TEST STRIPS test strip, See admin instructions., Disp: , Rfl:    Lancets (STERILANCE TL) MISC, See admin instructions., Disp: , Rfl:    losartan (COZAAR) 100 MG tablet, Take 50 mg by mouth 2 (two) times daily., Disp: , Rfl:  Lysine 500 MG CAPS, Take 1 capsule by mouth daily.  , Disp: , Rfl:    Melatonin 5 MG CAPS, Take 1 capsule by mouth at bedtime., Disp: , Rfl:    metFORMIN (GLUCOPHAGE-XR) 500 MG 24 hr tablet, Take 500 mg by mouth in the morning and at bedtime., Disp: , Rfl:    Multiple Vitamins-Minerals (CENTRUM SILVER PO), Take 1 tablet by mouth daily.  , Disp: , Rfl:    omeprazole (PRILOSEC) 20 MG capsule, Take 20 mg by mouth every other day. , Disp: , Rfl:    OXYGEN, Inhale into the lungs. 1-6 liters as directed, Disp: , Rfl:    rosuvastatin (CRESTOR) 10 MG tablet, Take 1 tablet (10 mg total) by mouth at bedtime., Disp: 90 tablet, Rfl: 0   triamcinolone cream (KENALOG) 0.5 %, Apply 1 application topically 2 (two) times daily., Disp: , Rfl:    valACYclovir (VALTREX) 500 MG tablet, Take 500 mg by mouth 2 (two) times daily as needed.  , Disp: , Rfl:   Orders Placed This Encounter  Procedures   Lipid Panel With LDL/HDL Ratio   LDL cholesterol, direct   CMP14+EGFR    There are no Patient Instructions on file for this visit.   --Continue cardiac medications as reconciled in final medication list. --Return in about 7 weeks (around 09/23/2021) for Follow up chest tightness. . Or sooner if needed. --Continue follow-up with your primary care physician regarding the management of your other chronic comorbid conditions.  Patient's questions and concerns were addressed to her satisfaction. She voices understanding of the instructions provided during this encounter.   This note was created using a voice recognition software as a result there may be grammatical errors inadvertently enclosed that do not reflect the nature of this encounter. Every attempt is made  to correct such errors.  Rex Kras, [place], [facility]  Pager: [phone] Office: [phone]

## 2021-08-10 DIAGNOSIS — H02054 Trichiasis without entropian left upper eyelid: Secondary | ICD-10-CM | POA: Diagnosis not present

## 2021-08-10 DIAGNOSIS — H16142 Punctate keratitis, left eye: Secondary | ICD-10-CM | POA: Diagnosis not present

## 2021-08-10 DIAGNOSIS — S00212A Abrasion of left eyelid and periocular area, initial encounter: Secondary | ICD-10-CM | POA: Diagnosis not present

## 2021-08-10 DIAGNOSIS — H2513 Age-related nuclear cataract, bilateral: Secondary | ICD-10-CM | POA: Diagnosis not present

## 2021-08-11 DIAGNOSIS — I10 Essential (primary) hypertension: Secondary | ICD-10-CM | POA: Diagnosis not present

## 2021-08-11 DIAGNOSIS — E785 Hyperlipidemia, unspecified: Secondary | ICD-10-CM | POA: Diagnosis not present

## 2021-08-11 DIAGNOSIS — E1169 Type 2 diabetes mellitus with other specified complication: Secondary | ICD-10-CM | POA: Diagnosis not present

## 2021-08-11 DIAGNOSIS — J441 Chronic obstructive pulmonary disease with (acute) exacerbation: Secondary | ICD-10-CM | POA: Diagnosis not present

## 2021-08-11 DIAGNOSIS — M810 Age-related osteoporosis without current pathological fracture: Secondary | ICD-10-CM | POA: Diagnosis not present

## 2021-08-11 DIAGNOSIS — J449 Chronic obstructive pulmonary disease, unspecified: Secondary | ICD-10-CM | POA: Diagnosis not present

## 2021-08-11 DIAGNOSIS — E119 Type 2 diabetes mellitus without complications: Secondary | ICD-10-CM | POA: Diagnosis not present

## 2021-08-13 DIAGNOSIS — E119 Type 2 diabetes mellitus without complications: Secondary | ICD-10-CM | POA: Diagnosis not present

## 2021-08-18 DIAGNOSIS — Z7984 Long term (current) use of oral hypoglycemic drugs: Secondary | ICD-10-CM | POA: Diagnosis not present

## 2021-08-18 DIAGNOSIS — I10 Essential (primary) hypertension: Secondary | ICD-10-CM | POA: Diagnosis not present

## 2021-08-18 DIAGNOSIS — E785 Hyperlipidemia, unspecified: Secondary | ICD-10-CM | POA: Diagnosis not present

## 2021-08-18 DIAGNOSIS — E1169 Type 2 diabetes mellitus with other specified complication: Secondary | ICD-10-CM | POA: Diagnosis not present

## 2021-08-18 DIAGNOSIS — J449 Chronic obstructive pulmonary disease, unspecified: Secondary | ICD-10-CM | POA: Diagnosis not present

## 2021-08-25 ENCOUNTER — Ambulatory Visit (HOSPITAL_COMMUNITY): Admit: 2021-08-25 | Payer: Medicare Other | Admitting: Gastroenterology

## 2021-08-25 ENCOUNTER — Encounter (HOSPITAL_COMMUNITY): Payer: Self-pay

## 2021-08-25 SURGERY — COLONOSCOPY WITH PROPOFOL
Anesthesia: Monitor Anesthesia Care

## 2021-08-26 DIAGNOSIS — R42 Dizziness and giddiness: Secondary | ICD-10-CM | POA: Diagnosis not present

## 2021-09-13 DIAGNOSIS — H2513 Age-related nuclear cataract, bilateral: Secondary | ICD-10-CM | POA: Diagnosis not present

## 2021-09-13 DIAGNOSIS — E119 Type 2 diabetes mellitus without complications: Secondary | ICD-10-CM | POA: Diagnosis not present

## 2021-09-13 DIAGNOSIS — H02054 Trichiasis without entropian left upper eyelid: Secondary | ICD-10-CM | POA: Diagnosis not present

## 2021-09-15 DIAGNOSIS — J449 Chronic obstructive pulmonary disease, unspecified: Secondary | ICD-10-CM | POA: Diagnosis not present

## 2021-09-15 DIAGNOSIS — E119 Type 2 diabetes mellitus without complications: Secondary | ICD-10-CM | POA: Diagnosis not present

## 2021-09-16 DIAGNOSIS — J441 Chronic obstructive pulmonary disease with (acute) exacerbation: Secondary | ICD-10-CM | POA: Diagnosis not present

## 2021-09-16 DIAGNOSIS — I10 Essential (primary) hypertension: Secondary | ICD-10-CM | POA: Diagnosis not present

## 2021-09-16 DIAGNOSIS — J449 Chronic obstructive pulmonary disease, unspecified: Secondary | ICD-10-CM | POA: Diagnosis not present

## 2021-09-16 DIAGNOSIS — E119 Type 2 diabetes mellitus without complications: Secondary | ICD-10-CM | POA: Diagnosis not present

## 2021-09-16 DIAGNOSIS — E785 Hyperlipidemia, unspecified: Secondary | ICD-10-CM | POA: Diagnosis not present

## 2021-09-16 DIAGNOSIS — I6522 Occlusion and stenosis of left carotid artery: Secondary | ICD-10-CM | POA: Diagnosis not present

## 2021-09-16 DIAGNOSIS — E782 Mixed hyperlipidemia: Secondary | ICD-10-CM | POA: Diagnosis not present

## 2021-09-16 DIAGNOSIS — M810 Age-related osteoporosis without current pathological fracture: Secondary | ICD-10-CM | POA: Diagnosis not present

## 2021-09-16 DIAGNOSIS — E1169 Type 2 diabetes mellitus with other specified complication: Secondary | ICD-10-CM | POA: Diagnosis not present

## 2021-09-17 LAB — CMP14+EGFR
ALT: 22 IU/L (ref 0–32)
AST: 24 IU/L (ref 0–40)
Albumin/Globulin Ratio: 2.3 — ABNORMAL HIGH (ref 1.2–2.2)
Albumin: 5.1 g/dL — ABNORMAL HIGH (ref 3.7–4.7)
Alkaline Phosphatase: 79 IU/L (ref 44–121)
BUN/Creatinine Ratio: 13 (ref 12–28)
BUN: 8 mg/dL (ref 8–27)
Bilirubin Total: 0.4 mg/dL (ref 0.0–1.2)
CO2: 30 mmol/L — ABNORMAL HIGH (ref 20–29)
Calcium: 11.1 mg/dL — ABNORMAL HIGH (ref 8.7–10.3)
Chloride: 101 mmol/L (ref 96–106)
Creatinine, Ser: 0.63 mg/dL (ref 0.57–1.00)
Globulin, Total: 2.2 g/dL (ref 1.5–4.5)
Glucose: 128 mg/dL — ABNORMAL HIGH (ref 70–99)
Potassium: 4.3 mmol/L (ref 3.5–5.2)
Sodium: 145 mmol/L — ABNORMAL HIGH (ref 134–144)
Total Protein: 7.3 g/dL (ref 6.0–8.5)
eGFR: 92 mL/min/{1.73_m2} (ref >59)

## 2021-09-17 LAB — LIPID PANEL WITH LDL/HDL RATIO
Cholesterol, Total: 165 mg/dL (ref 100–199)
HDL: 65 mg/dL (ref >39)
LDL Chol Calc (NIH): 77 mg/dL (ref 0–99)
LDL/HDL Ratio: 1.2 ratio (ref 0.0–3.2)
Triglycerides: 132 mg/dL (ref 0–149)
VLDL Cholesterol Cal: 23 mg/dL (ref 5–40)

## 2021-09-17 LAB — LDL CHOLESTEROL, DIRECT: LDL Direct: 68 mg/dL (ref 0–99)

## 2021-09-17 NOTE — Progress Notes (Signed)
Patient is aware 

## 2021-09-24 ENCOUNTER — Other Ambulatory Visit: Payer: Self-pay

## 2021-09-24 ENCOUNTER — Encounter: Payer: Self-pay | Admitting: Cardiology

## 2021-09-24 ENCOUNTER — Ambulatory Visit: Payer: Medicare Other | Admitting: Cardiology

## 2021-09-24 VITALS — BP 140/74 | HR 83 | Resp 16 | Ht 63.0 in | Wt 155.6 lb

## 2021-09-24 DIAGNOSIS — I6522 Occlusion and stenosis of left carotid artery: Secondary | ICD-10-CM

## 2021-09-24 DIAGNOSIS — E782 Mixed hyperlipidemia: Secondary | ICD-10-CM | POA: Diagnosis not present

## 2021-09-24 DIAGNOSIS — I951 Orthostatic hypotension: Secondary | ICD-10-CM | POA: Diagnosis not present

## 2021-09-24 DIAGNOSIS — R42 Dizziness and giddiness: Secondary | ICD-10-CM

## 2021-09-24 DIAGNOSIS — I10 Essential (primary) hypertension: Secondary | ICD-10-CM

## 2021-09-24 DIAGNOSIS — E119 Type 2 diabetes mellitus without complications: Secondary | ICD-10-CM | POA: Diagnosis not present

## 2021-09-24 MED ORDER — ROSUVASTATIN CALCIUM 5 MG PO TABS: 5.0000 mg | ORAL_TABLET | Freq: Every evening | ORAL | 1 refills | Status: DC

## 2021-09-24 MED ORDER — LOSARTAN POTASSIUM 50 MG PO TABS: 50.0000 mg | ORAL_TABLET | Freq: Two times a day (BID) | ORAL | 0 refills | Status: DC

## 2021-09-24 NOTE — Progress Notes (Signed)
ID:  Yolanda Morris, DOB May 10, 1946, MRN [id number]  PCP:  [name], MD  Cardiologist:  Rex Kras, DO, [facility] (established care 07/01/21 )  Date: 09/24/21 Last Office Visit: 08/05/2021  Chief Complaint  Patient presents with   Chest Pain   Follow-up    HPI  Yolanda Morris is a 74 y.o. 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   She is referred to the office at the request of [name], MD for evaluation of dizziness and chest discomfort.  During initial consultation patient was concerned of precordial discomfort which appeared to be noncardiac however given her multiple cardiovascular risk factors including diabetes and oxygen dependent COPD discharge decision was to proceed with ischemic evaluation.  Patient underwent an echocardiogram which noted preserved LVEF, grade 1 diastolic impairment, no significant valvular heart disease and nuclear stress test was reported to be low risk study.  Patient continued to have symptoms of chest discomfort which she described as a squeezing-like sensation, brought on by exposure to cold/windy weather, and at times effort related.  Patient was supposed to be on oxygen around-the-clock given her underlying COPD but she was using it on a as needed basis.  I suspect that her symptoms may be secondary to supply demand ischemia and she was recommended to start using her oxygen as originally prescribed by her other providers.  She presents today for follow-up with regards to chest pain.  Patient states that since our last office visit she has not had any chest pain and she is also been using her portable oxygen regularly.  Given her carotid  artery stenosis she was transitioned from simvastatin to Crestor 10 mg p.o. nightly.  She is tolerating the medication well would like to down titrate the dose to 5 mg p.o. daily.  Patient was initially referred to the office for dizziness.  She was noted to be orthostatic and therefore her antihypertensive medications were titrated.  She is also requested to take losartan 50 mg p.o. twice daily to prevent episodes of orthostasis.  She is tolerating the medication well and has not had any episodes of lightheaded or dizziness since this intervention.  She is requesting a refill of losartan 50 mg p.o. twice daily.  Blood pressure log reviewed.  Her morning blood pressures are still higher compared to evening pressures.  I suspect she may have an underlying sleep apnea given her pulmonary status and other chronic comorbid conditions.   FUNCTIONAL STATUS: No structured exercise program or daily routine.   ALLERGIES: Allergies  Allergen Reactions   Bacitracin     rash   Levofloxacin     hives    MEDICATION LIST PRIOR TO VISIT: Current Meds  Medication Sig   acetaminophen (TYLENOL) 325 MG tablet Take 650 mg by mouth every 6 (six) hours as needed. Twice a week   albuterol (PROVENTIL HFA;VENTOLIN HFA) 108 (90 Base) MCG/ACT inhaler Inhale 2 puffs into the lungs every 6 (six) hours as needed for wheezing or shortness of breath.   aspirin EC 81 MG EC tablet Take 81 mg by mouth daily.   CALCIUM CITRATE PO Take by mouth.   cetirizine (ZYRTEC) 10 MG tablet Take 10 mg by mouth daily.   Cholecalciferol (VITAMIN D3) 1000  UNITS CAPS Take 1 capsule by mouth daily.     Cranberry 500 MG TABS Take 1 tablet by mouth daily.   denosumab (PROLIA) 60 MG/ML SOSY injection Inject 60 mg into the skin every 6 (six) months.   Fluticasone-Umeclidin-Vilant (TRELEGY ELLIPTA) 100-62.5-25 MCG/INH AEPB Inhale 1 puff into the lungs daily.   IGLUCOSE TEST STRIPS test strip See admin instructions.   Lancets (STERILANCE TL)  MISC See admin instructions.   losartan (COZAAR) 50 MG tablet Take 1 tablet (50 mg total) by mouth 2 (two) times daily.   Lysine 500 MG CAPS Take 1 capsule by mouth daily.     Melatonin 5 MG CAPS Take 1 capsule by mouth daily as needed.   metFORMIN (GLUCOPHAGE-XR) 500 MG 24 hr tablet Take 500 mg by mouth in the morning and at bedtime.   Multiple Vitamins-Minerals (CENTRUM SILVER PO) Take 1 tablet by mouth daily.     omeprazole (PRILOSEC) 20 MG capsule Take 20 mg by mouth every other day.    OXYGEN Inhale into the lungs. 1-6 liters as directed   triamcinolone cream (KENALOG) 0.5 % Apply 1 application topically 2 (two) times daily.   valACYclovir (VALTREX) 500 MG tablet Take 500 mg by mouth 2 (two) times daily as needed.     [DISCONTINUED] losartan (COZAAR) 100 MG tablet Take 50 mg by mouth 2 (two) times daily.   [DISCONTINUED] rosuvastatin (CRESTOR) 10 MG tablet Take 1 tablet (10 mg total) by mouth at bedtime.     PAST MEDICAL HISTORY: Past Medical History:  Diagnosis Date   Cancer ([place])    skin   COPD (chronic obstructive pulmonary disease) (HCC)    DM (diabetes mellitus) ([place])    Dyspnea    increased activity    Genital herpes    Hyperlipidemia    Migraines    Osteoporosis    Unspecified essential hypertension     PAST SURGICAL HISTORY: Past Surgical History:  Procedure Laterality Date   BREAST BIOPSY     BREAST EXCISIONAL BIOPSY     over 20 years ago- benign   COLON SURGERY     removal of polyps   DENTAL SURGERY     RETINAL TEAR REPAIR CRYOTHERAPY      FAMILY HISTORY: The patient family history includes Alcohol abuse in her brother, father, and sister; COPD in her mother; Heart attack in her sister; Heart disease in her mother.  SOCIAL HISTORY:  The patient  reports that she quit smoking about 24 years ago. Her smoking use included cigarettes. She has a 50.00 pack-year smoking history. She has never used smokeless tobacco. She reports that she does not drink alcohol  and does not use drugs.  REVIEW OF SYSTEMS: Review of Systems  Constitutional: Negative for chills and fever.  HENT:  Negative for ear pain, hoarse voice and nosebleeds.   Eyes:  Negative for discharge, double vision and pain.  Cardiovascular:  Negative for chest pain, claudication, dyspnea on exertion, leg swelling, near-syncope, orthopnea, palpitations, paroxysmal nocturnal dyspnea and syncope.  Respiratory:  Positive for shortness of breath (chronic and stable). Negative for hemoptysis.   Musculoskeletal:  Negative for muscle cramps and myalgias.  Gastrointestinal:  Negative for abdominal pain, constipation, diarrhea, hematemesis, hematochezia, melena, nausea and vomiting.  Neurological:  Negative for dizziness, light-headedness and vertigo.   PHYSICAL EXAM: Vitals with BMI 09/24/2021 08/05/2021 07/01/2021  Height 5\' 3"  5\' 3"  5\' 3"   Weight 155 lbs 10 oz 154 lbs 13 oz 152 lbs  BMI 27.57 27.43  73.53  Systolic 299 242 683  Diastolic 74 78 85  Pulse 83 85 98    CONSTITUTIONAL: Well-developed and well-nourished. No acute distress.  SKIN: Skin is warm and dry. No rash noted. No cyanosis. No pallor. No jaundice HEAD: Normocephalic and atraumatic.  EYES: No scleral icterus MOUTH/THROAT: Moist oral membranes.  NECK: No JVD present. No thyromegaly noted. No carotid bruits  LYMPHATIC: No visible cervical adenopathy.  CHEST Normal respiratory effort. No intercostal retractions  LUNGS: Decreased breath sounds bilaterally, poor inspiratory effort, kyphosis noted in the posterior chest wall no stridor. No wheezes. No rales.  CARDIOVASCULAR: Regular, positive M1-D6, soft holosystolic murmur, no gallops or rubs ABDOMINAL: No apparent ascites.  EXTREMITIES: No peripheral edema  HEMATOLOGIC: No significant bruising NEUROLOGIC: Oriented to person, place, and time. Nonfocal. Normal muscle tone.  PSYCHIATRIC: Normal mood and affect. Normal behavior. Cooperative  CARDIAC DATABASE: EKG: 07/01/2021:  Sinus tachycardia 102 bpm, normal axis, nonspecific ST depressions,without underlying injury pattern.   Echocardiogram: 07/27/2021: Left ventricle cavity is normal in size. Mild concentric hypertrophy of the left ventricle. Normal global wall motion.  Normal LV systolic function with EF 61%. Doppler evidence of grade I (impaired) diastolic dysfunction, normal LAP.  Mild (Grade I) mitral regurgitation. Estimated right atrial pressure 8 mmHg.   Stress Testing: Modified Bruce/Lexiscan Tetrofosmin stress test 07/14/2021: Lexiscan/modified Bruce nuclear stress test performed using 1-day protocol. Patient achieved 2.2 METS on modified Bruce protocol and reached 108% MPHR. Stress EKG at 108% MPHR showed sinus tachycardia, no ST-T wave abnormality. Normal myocardial perfusion. Stress LVEF 63%. Low risk study.  Heart Catheterization: None  Carotid artery duplex 07/27/2021:  Duplex suggests stenosis in the right internal carotid artery (1-15%).  Duplex suggests stenosis in the right external carotid artery (<50%). Duplex suggests stenosis in the left internal carotid artery (16-49%).  Duplex suggests stenosis in the left external carotid artery.  There is homogenous plaque in the right carotid artery and heterogeneous plaque in the left carotid artery. Antegrade right vertebral artery flow. Antegrade left vertebral artery flow. Follow up in one year is appropriate if clinically indicated.  LABORATORY DATA: No flowsheet data found.  CMP Latest Ref Rng & Units 09/16/2021 10/23/2018  Glucose 70 - 99 mg/dL 128(H) 96  BUN 8 - 27 mg/dL 8 12  Creatinine 0.57 - 1.00 mg/dL 0.63 0.66  Sodium 134 - 144 mmol/L 145(H) 141  Potassium 3.5 - 5.2 mmol/L 4.3 4.4  Chloride 96 - 106 mmol/L 101 100  CO2 20 - 29 mmol/L 30(H) 35(H)  Calcium 8.7 - 10.3 mg/dL 11.1(H) 10.5  Total Protein 6.0 - 8.5 g/dL 7.3 -  Total Bilirubin 0.0 - 1.2 mg/dL 0.4 -  Alkaline Phos 44 - 121 IU/L 79 -  AST 0 - 40 IU/L 24 -  ALT 0 -  32 IU/L 22 -    Lipid Panel     Component Value Date/Time   CHOL 165 09/16/2021 0910   TRIG 132 09/16/2021 0910   HDL 65 09/16/2021 0910   LDLCALC 77 09/16/2021 0910   LDLDIRECT 68 09/16/2021 0910   LABVLDL 23 09/16/2021 0910    No components found for: NTPROBNP No results for input(s): PROBNP in the last 8760 hours. No results for input(s): TSH in the last 8760 hours.  BMP Recent Labs    09/16/21 0910  NA 145*  K 4.3  CL 101  CO2 30*  GLUCOSE 128*  BUN 8  CREATININE 0.63  CALCIUM 11.1*    HEMOGLOBIN A1C No results found  for: HGBA1C, MPG  External Labs: Collected: 06/12/2019.  Provided by the referring physician. Sodium 143, potassium 4.7, chloride 103, bicarb 28, BUN 9, creatinine 0.6 AST 18, ALT 16, alkaline phosphatase 75 Hemoglobin 14.7 g/dL, hematocrit 44.8%  IMPRESSION:    ICD-10-CM   1. Stenosis of left internal carotid artery  I65.22 rosuvastatin (CRESTOR) 5 MG tablet    2. Mixed hyperlipidemia  E78.2 rosuvastatin (CRESTOR) 5 MG tablet    3. Benign hypertension  I10 losartan (COZAAR) 50 MG tablet    4. Type 2 diabetes mellitus without complication, without long-term current use of insulin (HCC)  E11.9     5. Dizziness  R42     6. Orthostatic hypotension  I95.1         RECOMMENDATIONS: Yolanda Morris is a 75 y.o. female whose past medical history and cardiac risk factors include: Left carotid artery stenosis, hypertension, COPD on home oxygen 2L [employer], diabetes, hyperlipidemia, genital herpes, history of migraines, former smoker, postmenopausal female, advance age.   Stenosis of left internal carotid artery Left ICA stenosis per carotid duplex noted to be less than 50%. Continue aspirin and statin therapy. 1 year study has been ordered for 07/27/2022 Simvastatin was discontinued and transitioned to Crestor 10 mg p.o. nightly. Patient tolerated Crestor well and repeat labs on 09/16/2021 notes stable LFTs and improvement in LDL levels.  However  patient is concerned that Crestor may cause renal dysfunction would like to reduce the dose or change the medications. Shared decision is to reduce the dose to 5 mg p.o. nightly.  New prescription provided. She has upcoming lipid check w/ PCP in May 2023.   Mixed hyperlipidemia Currently on Crestor.   She denies myalgia or other side effects. Most recent lipids dated 09/16/2021 reviewed as noted above. Will reduce Crestor to 5 mg p.o. nightly -see above  Benign hypertension Blood pressure within acceptable range.  Home blood pressure log reviewed. Patient is losartan 100 mg p.o. daily was transition to 50 mg p.o. twice daily given her history of orthostasis and dizziness.  After reducing the medication to twice a day she has not had any episodes of lightheaded and dizziness. I have asked her to continue keeping her blood pressure log and if her morning blood pressures continue to be elevated she should be screened for sleep apnea.  Medications reconciled.  Low salt diet.   Type 2 diabetes mellitus without complication, without long-term current use of insulin ([place]) Educated on the importance of glycemic control. Patient is currently on ARB as well as statin therapy  FINAL MEDICATION LIST END OF ENCOUNTER: Meds ordered this encounter  Medications   losartan (COZAAR) 50 MG tablet    Sig: Take 1 tablet (50 mg total) by mouth 2 (two) times daily.    Dispense:  180 tablet    Refill:  0   rosuvastatin (CRESTOR) 5 MG tablet    Sig: Take 1 tablet (5 mg total) by mouth at bedtime.    Dispense:  90 tablet    Refill:  1     Medications Discontinued During This Encounter  Medication Reason   COVID-19 mRNA bivalent vaccine, Pfizer, injection    losartan (COZAAR) 100 MG tablet Dose change   rosuvastatin (CRESTOR) 10 MG tablet Reorder     Current Outpatient Medications:    acetaminophen (TYLENOL) 325 MG tablet, Take 650 mg by mouth every 6 (six) hours as needed. Twice a week, Disp: , Rfl:     albuterol (PROVENTIL HFA;VENTOLIN HFA) 108 (  90 Base) MCG/ACT inhaler, Inhale 2 puffs into the lungs every 6 (six) hours as needed for wheezing or shortness of breath., Disp: 3 Inhaler, Rfl: 3   aspirin EC 81 MG EC tablet, Take 81 mg by mouth daily., Disp: , Rfl:    CALCIUM CITRATE PO, Take by mouth., Disp: , Rfl:    cetirizine (ZYRTEC) 10 MG tablet, Take 10 mg by mouth daily., Disp: , Rfl:    Cholecalciferol (VITAMIN D3) 1000 UNITS CAPS, Take 1 capsule by mouth daily.  , Disp: , Rfl:    Cranberry 500 MG TABS, Take 1 tablet by mouth daily., Disp: , Rfl:    denosumab (PROLIA) 60 MG/ML SOSY injection, Inject 60 mg into the skin every 6 (six) months., Disp: , Rfl:    Fluticasone-Umeclidin-Vilant (TRELEGY ELLIPTA) 100-62.5-25 MCG/INH AEPB, Inhale 1 puff into the lungs daily., Disp: 90 each, Rfl: 3   IGLUCOSE TEST STRIPS test strip, See admin instructions., Disp: , Rfl:    Lancets (STERILANCE TL) MISC, See admin instructions., Disp: , Rfl:    losartan (COZAAR) 50 MG tablet, Take 1 tablet (50 mg total) by mouth 2 (two) times daily., Disp: 180 tablet, Rfl: 0   Lysine 500 MG CAPS, Take 1 capsule by mouth daily.  , Disp: , Rfl:    Melatonin 5 MG CAPS, Take 1 capsule by mouth daily as needed., Disp: , Rfl:    metFORMIN (GLUCOPHAGE-XR) 500 MG 24 hr tablet, Take 500 mg by mouth in the morning and at bedtime., Disp: , Rfl:    Multiple Vitamins-Minerals (CENTRUM SILVER PO), Take 1 tablet by mouth daily.  , Disp: , Rfl:    omeprazole (PRILOSEC) 20 MG capsule, Take 20 mg by mouth every other day. , Disp: , Rfl:    OXYGEN, Inhale into the lungs. 1-6 liters as directed, Disp: , Rfl:    triamcinolone cream (KENALOG) 0.5 %, Apply 1 application topically 2 (two) times daily., Disp: , Rfl:    valACYclovir (VALTREX) 500 MG tablet, Take 500 mg by mouth 2 (two) times daily as needed.  , Disp: , Rfl:    rosuvastatin (CRESTOR) 5 MG tablet, Take 1 tablet (5 mg total) by mouth at bedtime., Disp: 90 tablet, Rfl: 1  No orders  of the defined types were placed in this encounter.   There are no Patient Instructions on file for this visit.   --Continue cardiac medications as reconciled in final medication list. --Return in about 6 months (around 03/25/2022) for Follow up carotid disease. . Or sooner if needed. --Continue follow-up with your primary care physician regarding the management of your other chronic comorbid conditions.  Patient's questions and concerns were addressed to her satisfaction. She voices understanding of the instructions provided during this encounter.   This note was created using a voice recognition software as a result there may be grammatical errors inadvertently enclosed that do not reflect the nature of this encounter. Every attempt is made to correct such errors.  Rex Kras, [place], [facility]  Pager: [phone] Office: [phone]

## 2021-10-14 DIAGNOSIS — M810 Age-related osteoporosis without current pathological fracture: Secondary | ICD-10-CM | POA: Diagnosis not present

## 2021-10-14 DIAGNOSIS — E119 Type 2 diabetes mellitus without complications: Secondary | ICD-10-CM | POA: Diagnosis not present

## 2021-10-21 ENCOUNTER — Other Ambulatory Visit: Payer: Self-pay

## 2021-10-21 ENCOUNTER — Ambulatory Visit
Admission: RE | Admit: 2021-10-21 | Discharge: 2021-10-21 | Disposition: A | Discharge status: Home / Self Care | Payer: Medicare Other | Source: Ambulatory Visit | Attending: Internal Medicine | Admitting: Internal Medicine

## 2021-10-21 DIAGNOSIS — M85852 Other specified disorders of bone density and structure, left thigh: Secondary | ICD-10-CM | POA: Diagnosis not present

## 2021-10-21 DIAGNOSIS — Z8781 Personal history of (healed) traumatic fracture: Secondary | ICD-10-CM

## 2021-10-21 DIAGNOSIS — M810 Age-related osteoporosis without current pathological fracture: Secondary | ICD-10-CM | POA: Diagnosis not present

## 2021-10-21 DIAGNOSIS — Z780 Asymptomatic menopausal state: Secondary | ICD-10-CM | POA: Diagnosis not present

## 2021-10-28 ENCOUNTER — Encounter: Payer: Self-pay | Admitting: Internal Medicine

## 2021-10-28 ENCOUNTER — Telehealth: Payer: Self-pay | Admitting: Internal Medicine

## 2021-10-28 ENCOUNTER — Other Ambulatory Visit: Payer: Self-pay

## 2021-10-28 ENCOUNTER — Telehealth (INDEPENDENT_AMBULATORY_CARE_PROVIDER_SITE_OTHER): Payer: Medicare Other | Admitting: Internal Medicine

## 2021-10-28 DIAGNOSIS — N3000 Acute cystitis without hematuria: Secondary | ICD-10-CM

## 2021-10-28 DIAGNOSIS — J069 Acute upper respiratory infection, unspecified: Secondary | ICD-10-CM | POA: Diagnosis not present

## 2021-10-28 LAB — POCT INFLUENZA A/B
Influenza A, POC: NEGATIVE
Influenza B, POC: NEGATIVE

## 2021-10-28 NOTE — Progress Notes (Signed)
I connected with Yolanda Morris  on 10/28/2021 by video enabled telemedicine application and verified that I am speaking with the correct person using two identifiers. Patient is at home, Physician is in office.    I discussed the limitations of evaluation and management by telemedicine. The patient expressed understanding and agreed to proceed.               Yolanda Morris    [id number]    Jun 29, 1946  Primary Care Physician:Smith, Hal Hope, MD Date of Appointment: 10/28/2021 Established Patient Visit  Chief complaint:   Chief Complaint  Patient presents with   URI     HPI: Yolanda Morris is a 76 y.o. woman with history COPD and COVID infection. On home oxygen 2L[place].    Lives at home with her dog. Husband passed away in 01/17/2018.  DME [employer]   She has done pulmonary rehabilitation twice and really enjoyed it.   Interval Updates: Symptoms started 4 days ago with sore throat. Having body aches, nasal congestion with Fevers,chills. No nausea, vomiting. Having lots of rhinorrhea and mucus production.  Taking mucinex over the counter   Home covid test negative x 2.  Recent trip to [place] to see family - no sick contacts.   Not using inhalers more frequently. No chest tightness or wheezing, Oxygen saturation is 93-96% on room air. Doesn't wear oxygen at rest.   Pulse 100  I have reviewed the patient's family social and past medical history and updated as appropriate.   Past Medical History:  Diagnosis Date   Cancer ([place])    skin   COPD (chronic obstructive pulmonary disease) (HCC)    DM (diabetes mellitus) ([place])    Dyspnea    increased activity    Genital herpes    Hyperlipidemia    Migraines    Osteoporosis    Unspecified essential hypertension     Past Surgical History:  Procedure Laterality Date   BREAST BIOPSY     BREAST EXCISIONAL BIOPSY     over 20 years ago- benign   COLON SURGERY     removal of polyps   DENTAL SURGERY     RETINAL TEAR REPAIR  CRYOTHERAPY      Family History  Problem Relation Age of Onset   COPD Mother        deceased   Heart disease Mother    Alcohol abuse Father    Alcohol abuse Sister        deceased   Heart attack Sister    Alcohol abuse Brother        deceased    Social History   Occupational History   Occupation: Development worker, community  Tobacco Use   Smoking status: Former    Packs/day: 2.00    Years: 25.00    Pack years: 50.00    Types: Cigarettes    Quit date: 10/17/1996    Years since quitting: 25.0   Smokeless tobacco: Never  Vaping Use   Vaping Use: Never used  Substance and Sexual Activity   Alcohol use: No    Alcohol/week: 0.0 standard drinks   Drug use: No   Sexual activity: Not on file     Physical Exam: There were no vitals taken for this visit. Vitals taken at home oxygen 93-96% on room air. HR 100 Gen:      No acute distress, on room air, nasal congestion No increased work of breathing, no wheezing   Data Reviewed: Imaging: I have personally reviewed  the chest xray October 2021 which shows hyperinflation   PFTs:   PFT Results Latest Ref Rng & Units 09/09/2019  FVC-Pre L 1.69  FVC-Predicted Pre % 63  FVC-Post L 1.77  FVC-Predicted Post % 66  Pre FEV1/FVC % % 58  Post FEV1/FCV % % 57  FEV1-Pre L 0.99  FEV1-Predicted Pre % 49  FEV1-Post L 1.00  DLCO uncorrected ml/min/mmHg 16.62  DLCO UNC% % 92  DLVA Predicted % 115  TLC L 5.98  TLC % Predicted % 125  RV % Predicted % 185   I have personally reviewed the patient's PFTs and they show severe airflow limitation.   Labs:   Immunization status: Immunization History  Administered Date(s) Administered   Fluad Quad(high Dose 65+) 06/17/2019, 06/30/2021   Influenza Split 06/18/2011, 07/10/2012, 07/30/2013, 08/17/2013, 07/15/2014, 06/18/2015, 07/05/2016, 07/21/2020   Influenza, High Dose Seasonal PF 06/09/2011, 07/10/2012, 07/12/2017, 07/11/2018, 07/21/2020   Influenza,inj,Quad PF,6+ Mos 07/07/2015,  07/11/2016   Influenza-Unspecified 07/16/2014, 07/16/2018   Moderna SARS-COV2 Booster Vaccination 03/09/2021   PFIZER(Purple Top)SARS-COV-2 Vaccination 11/21/2019, 12/17/2019, 08/04/2020   Pfizer Covid-19 Vaccine Bivalent Booster 25yrs & up 07/20/2021   Pneumococcal Conjugate-13 12/18/2013   Pneumococcal Polysaccharide-23 07/10/2012   Tdap 07/10/2012   Zoster Recombinat (Shingrix) 12/16/2018, 04/16/2019   Zoster, Live 04/24/2008, 12/16/2018, 04/16/2019    Assessment:  COPD with severe airflow limitation FEV1 44% of predicted Chronic respiratory failure History of tobacco use disorder Acute viral URI  Possible UTI  Plan/Recommendations: continue trelegy continue albuterol nebulizer beforehand, and can take it again in the evenings.  continue home oxygen  Covid negative x 2 at home. Recommend testing for flu - she [facility] and call with results and prescribe tamiflu since she is in 5 day window still I also offered her a test here.  She is having urinary frequency and wonders if she has a UTI. Will order UA. She will come tomorrow for dropping off a sample.   No need for steroids based on her current symptoms  She is out of the window for lung cancer screening - quit in 1998.   Return to Care: Return in about 3 months (around 01/26/2022).   Lenice Llamas, MD Pulmonary and [place]

## 2021-10-28 NOTE — Addendum Note (Signed)
Addended by: Darliss Ridgel on: 10/28/2021 04:06 PM   Modules accepted: Orders

## 2021-10-28 NOTE — Telephone Encounter (Signed)
Pt states that she has had a sore throat, ear hurt, chills. She feels like she has a UTI. Did home test for covid on tuesday which was negative. Has not done one today and is hesitant to come in considering it could still be covid or the flu. Pharmacy is [facility] on [address].

## 2021-10-28 NOTE — Patient Instructions (Signed)
Please schedule follow up scheduled with myself in 3 months.  If my schedule is not open yet, we will contact you with a reminder closer to that time. Please call [phone] if you haven't heard from [place] a month before.

## 2021-10-28 NOTE — Telephone Encounter (Signed)
Called and spoke with patient. She stated that she has not been feeling well since this past Monday. She has had a sore throat, bilateral ear pain and chills. She took a COVID test on Tuesday but it came back negative. She wanted an appt did not want to come into the office in case she is contagious. I was able to get her scheduled for a video visit this afternoon with ND at 215pm. She is aware of the video visit process.   Will send message over to ND as a FYI.   Nothing further needed at time of call.

## 2021-10-29 LAB — URINALYSIS, ROUTINE W REFLEX MICROSCOPIC
Bilirubin Urine: NEGATIVE
Hgb urine dipstick: NEGATIVE
Nitrite: NEGATIVE
Specific Gravity, Urine: 1.01 (ref 1.000–1.030)
Total Protein, Urine: NEGATIVE
Urine Glucose: NEGATIVE
Urobilinogen, UA: 0.2 (ref 0.0–1.0)
pH: 5.5 (ref 5.0–8.0)

## 2021-11-01 ENCOUNTER — Telehealth: Payer: Self-pay

## 2021-11-01 DIAGNOSIS — I10 Essential (primary) hypertension: Secondary | ICD-10-CM | POA: Diagnosis not present

## 2021-11-01 DIAGNOSIS — M810 Age-related osteoporosis without current pathological fracture: Secondary | ICD-10-CM | POA: Diagnosis not present

## 2021-11-01 DIAGNOSIS — E1169 Type 2 diabetes mellitus with other specified complication: Secondary | ICD-10-CM | POA: Diagnosis not present

## 2021-11-01 DIAGNOSIS — J449 Chronic obstructive pulmonary disease, unspecified: Secondary | ICD-10-CM | POA: Diagnosis not present

## 2021-11-01 DIAGNOSIS — E785 Hyperlipidemia, unspecified: Secondary | ICD-10-CM | POA: Diagnosis not present

## 2021-11-01 DIAGNOSIS — J441 Chronic obstructive pulmonary disease with (acute) exacerbation: Secondary | ICD-10-CM | POA: Diagnosis not present

## 2021-11-01 DIAGNOSIS — E119 Type 2 diabetes mellitus without complications: Secondary | ICD-10-CM | POA: Diagnosis not present

## 2021-11-01 NOTE — Telephone Encounter (Signed)
Please update MAR

## 2021-11-01 NOTE — Telephone Encounter (Signed)
I did

## 2021-11-03 ENCOUNTER — Telehealth: Payer: Self-pay

## 2021-11-03 NOTE — Telephone Encounter (Signed)
Has she had a recent lipid profile? If so please obtain a copy for reference.   Of a separate note, she was on Crestor 10mg  po qday but we reduced it to 5mg  po qhs as she was concerned of having renal issues as per the last office note.

## 2021-11-03 NOTE — Telephone Encounter (Signed)
Pt called and stated that she had an appt with [place] the same with as her PCP. She wanted to know if you needed lab work and if she should move her appt with [place] since her PCP will most likely have labs done as well. She also stated that the pharmacist at her PCP office called her to review her medications and recommended that her Rosuvastatin 5 mg daily be raised to 10 mg daily. She said she already has 10 mg tablets and wants your advice on wether or not she should start taking these.

## 2021-11-05 ENCOUNTER — Other Ambulatory Visit: Payer: Self-pay | Admitting: Internal Medicine

## 2021-11-05 DIAGNOSIS — M810 Age-related osteoporosis without current pathological fracture: Secondary | ICD-10-CM

## 2021-11-05 NOTE — Telephone Encounter (Signed)
Called and spoke to pt, she has not had any lab work done recently. She stated her PCP said her renal issues will not be affected and that she should listen to you regarding the crestor. She stated that she is okay with starting the crestor 10 mg if you are okay with it.

## 2021-11-08 NOTE — Telephone Encounter (Signed)
Please refill Crestor 10 mg p.o. nightly. CMP, fasting lipid profile, and direct LDL in 6 weeks after taking Crestor 10 mg p.o. nightly.  Please order and release labs.  Dr.Mingo Siegert

## 2021-11-10 ENCOUNTER — Other Ambulatory Visit: Payer: Self-pay

## 2021-11-10 ENCOUNTER — Other Ambulatory Visit: Payer: Self-pay | Admitting: Family Medicine

## 2021-11-10 DIAGNOSIS — E782 Mixed hyperlipidemia: Secondary | ICD-10-CM

## 2021-11-10 DIAGNOSIS — Z1231 Encounter for screening mammogram for malignant neoplasm of breast: Secondary | ICD-10-CM

## 2021-11-10 DIAGNOSIS — I10 Essential (primary) hypertension: Secondary | ICD-10-CM

## 2021-11-10 DIAGNOSIS — E119 Type 2 diabetes mellitus without complications: Secondary | ICD-10-CM

## 2021-11-10 DIAGNOSIS — I6522 Occlusion and stenosis of left carotid artery: Secondary | ICD-10-CM

## 2021-11-10 MED ORDER — ROSUVASTATIN CALCIUM 10 MG PO TABS: 10.0000 mg | ORAL_TABLET | Freq: Every evening | ORAL | 0 refills | Status: DC

## 2021-11-10 NOTE — Telephone Encounter (Signed)
Called and spoke to pt, pt voiced understandings. Sent in Crestor 10 mg p.o. nightly. Labs have been sent to [facility].

## 2021-11-16 DIAGNOSIS — E119 Type 2 diabetes mellitus without complications: Secondary | ICD-10-CM | POA: Diagnosis not present

## 2021-12-13 ENCOUNTER — Other Ambulatory Visit: Payer: Self-pay | Admitting: Cardiology

## 2021-12-13 DIAGNOSIS — I10 Essential (primary) hypertension: Secondary | ICD-10-CM

## 2021-12-13 DIAGNOSIS — E119 Type 2 diabetes mellitus without complications: Secondary | ICD-10-CM | POA: Diagnosis not present

## 2021-12-27 ENCOUNTER — Ambulatory Visit
Admission: RE | Admit: 2021-12-27 | Discharge: 2021-12-27 | Disposition: A | Discharge status: Home / Self Care | Payer: Medicare Other | Source: Ambulatory Visit | Attending: Family Medicine | Admitting: Family Medicine

## 2021-12-27 DIAGNOSIS — Z1231 Encounter for screening mammogram for malignant neoplasm of breast: Secondary | ICD-10-CM

## 2021-12-28 ENCOUNTER — Other Ambulatory Visit: Payer: Self-pay | Admitting: Family Medicine

## 2021-12-28 DIAGNOSIS — R928 Other abnormal and inconclusive findings on diagnostic imaging of breast: Secondary | ICD-10-CM

## 2021-12-30 DIAGNOSIS — I10 Essential (primary) hypertension: Secondary | ICD-10-CM | POA: Diagnosis not present

## 2021-12-30 DIAGNOSIS — E119 Type 2 diabetes mellitus without complications: Secondary | ICD-10-CM | POA: Diagnosis not present

## 2021-12-30 DIAGNOSIS — E782 Mixed hyperlipidemia: Secondary | ICD-10-CM | POA: Diagnosis not present

## 2021-12-31 LAB — COMPREHENSIVE METABOLIC PANEL
ALT: 18 IU/L (ref 0–32)
AST: 22 IU/L (ref 0–40)
Albumin/Globulin Ratio: 1.9 (ref 1.2–2.2)
Albumin: 5 g/dL — ABNORMAL HIGH (ref 3.7–4.7)
Alkaline Phosphatase: 88 IU/L (ref 44–121)
BUN/Creatinine Ratio: 17 (ref 12–28)
BUN: 11 mg/dL (ref 8–27)
Bilirubin Total: 0.4 mg/dL (ref 0.0–1.2)
CO2: 28 mmol/L (ref 20–29)
Calcium: 10.6 mg/dL — ABNORMAL HIGH (ref 8.7–10.3)
Chloride: 99 mmol/L (ref 96–106)
Creatinine, Ser: 0.63 mg/dL (ref 0.57–1.00)
Globulin, Total: 2.6 g/dL (ref 1.5–4.5)
Glucose: 139 mg/dL — ABNORMAL HIGH (ref 70–99)
Potassium: 4.3 mmol/L (ref 3.5–5.2)
Sodium: 141 mmol/L (ref 134–144)
Total Protein: 7.6 g/dL (ref 6.0–8.5)
eGFR: 92 mL/min/{1.73_m2} (ref >59)

## 2021-12-31 LAB — LDL CHOLESTEROL, DIRECT: LDL Direct: 84 mg/dL (ref 0–99)

## 2021-12-31 LAB — LIPID PANEL
Chol/HDL Ratio: 2.7 ratio (ref 0.0–4.4)
Cholesterol, Total: 187 mg/dL (ref 100–199)
HDL: 69 mg/dL (ref >39)
LDL Chol Calc (NIH): 94 mg/dL (ref 0–99)
Triglycerides: 137 mg/dL (ref 0–149)
VLDL Cholesterol Cal: 24 mg/dL (ref 5–40)

## 2022-01-10 ENCOUNTER — Ambulatory Visit
Admission: RE | Admit: 2022-01-10 | Discharge: 2022-01-10 | Disposition: A | Discharge status: Home / Self Care | Payer: Medicare Other | Source: Ambulatory Visit | Attending: Family Medicine | Admitting: Family Medicine

## 2022-01-10 ENCOUNTER — Other Ambulatory Visit: Payer: Self-pay | Admitting: Family Medicine

## 2022-01-10 DIAGNOSIS — R928 Other abnormal and inconclusive findings on diagnostic imaging of breast: Secondary | ICD-10-CM

## 2022-01-10 DIAGNOSIS — N6489 Other specified disorders of breast: Secondary | ICD-10-CM

## 2022-01-10 DIAGNOSIS — R922 Inconclusive mammogram: Secondary | ICD-10-CM | POA: Diagnosis not present

## 2022-01-12 ENCOUNTER — Telehealth: Payer: Self-pay | Admitting: Internal Medicine

## 2022-01-12 MED ORDER — ALBUTEROL SULFATE (2.5 MG/3ML) 0.083% IN NEBU: 2.5000 mg | INHALATION_SOLUTION | Freq: Four times a day (QID) | RESPIRATORY_TRACT | 12 refills | Status: DC | PRN

## 2022-01-12 MED ORDER — ALBUTEROL SULFATE HFA 108 (90 BASE) MCG/ACT IN AERS: 2.0000 | INHALATION_SPRAY | Freq: Four times a day (QID) | RESPIRATORY_TRACT | 3 refills | Status: DC | PRN

## 2022-01-12 NOTE — Telephone Encounter (Signed)
Magdalen Spatz, NP  Claudette Head A, CMA 38 minutes ago (2:14 PM)  ? ?Can we get albuterol neb solution through a DME? If not we can send in a scrip for xopenex as needed for shortness of breath or wheezing  .0.63 mg up to 3 times daily. Thanks   ? ? ? ?Called and spoke with pt letting her know the info per SG. Pt verbalized understanding. Rx has been sent to [facility] to see if they have albuterol sol in stock. Pt was fine having Rx for xopenex sol sent to pharmacy if not able to get the albuterol. Nothing further needed. ?

## 2022-01-12 NOTE — Telephone Encounter (Signed)
Spoke to patient, she is requesting alternative for albuterol solution. Duoneb and albuterol solution are both on back order at [facility].  ?Albuterol HFA sent to preferred pharmacy per patient request.  ? ?Judson Roch, please advise on neb solution. Thanks Dr. Shearon Stalls is unavailable until 01/20/2022 ?

## 2022-01-12 NOTE — Telephone Encounter (Signed)
Lm for patient.  

## 2022-01-13 ENCOUNTER — Telehealth: Payer: Self-pay | Admitting: Acute Care

## 2022-01-13 NOTE — Telephone Encounter (Signed)
Office note printed and faxed to [facility] from [place]. Nothing further needed  ?

## 2022-01-14 DIAGNOSIS — I10 Essential (primary) hypertension: Secondary | ICD-10-CM | POA: Diagnosis not present

## 2022-01-14 DIAGNOSIS — J441 Chronic obstructive pulmonary disease with (acute) exacerbation: Secondary | ICD-10-CM | POA: Diagnosis not present

## 2022-01-14 DIAGNOSIS — E119 Type 2 diabetes mellitus without complications: Secondary | ICD-10-CM | POA: Diagnosis not present

## 2022-01-19 DIAGNOSIS — I10 Essential (primary) hypertension: Secondary | ICD-10-CM | POA: Diagnosis not present

## 2022-01-19 DIAGNOSIS — E1169 Type 2 diabetes mellitus with other specified complication: Secondary | ICD-10-CM | POA: Diagnosis not present

## 2022-01-19 DIAGNOSIS — M810 Age-related osteoporosis without current pathological fracture: Secondary | ICD-10-CM | POA: Diagnosis not present

## 2022-01-19 DIAGNOSIS — J441 Chronic obstructive pulmonary disease with (acute) exacerbation: Secondary | ICD-10-CM | POA: Diagnosis not present

## 2022-01-19 DIAGNOSIS — E785 Hyperlipidemia, unspecified: Secondary | ICD-10-CM | POA: Diagnosis not present

## 2022-01-24 ENCOUNTER — Ambulatory Visit
Admission: RE | Admit: 2022-01-24 | Discharge: 2022-01-24 | Disposition: A | Discharge status: Home / Self Care | Payer: Medicare Other | Source: Ambulatory Visit | Attending: Family Medicine | Admitting: Family Medicine

## 2022-01-24 ENCOUNTER — Other Ambulatory Visit: Payer: Self-pay | Admitting: Family Medicine

## 2022-01-24 DIAGNOSIS — N6310 Unspecified lump in the right breast, unspecified quadrant: Secondary | ICD-10-CM

## 2022-01-24 DIAGNOSIS — N6489 Other specified disorders of breast: Secondary | ICD-10-CM

## 2022-01-24 DIAGNOSIS — N6311 Unspecified lump in the right breast, upper outer quadrant: Secondary | ICD-10-CM | POA: Diagnosis not present

## 2022-01-24 DIAGNOSIS — C50411 Malignant neoplasm of upper-outer quadrant of right female breast: Secondary | ICD-10-CM | POA: Diagnosis not present

## 2022-01-26 ENCOUNTER — Telehealth: Payer: Self-pay | Admitting: Hematology and Oncology

## 2022-01-26 ENCOUNTER — Encounter: Payer: Self-pay | Admitting: *Deleted

## 2022-01-26 NOTE — Telephone Encounter (Signed)
Spoke to patient to confirm afternoon clinic appointment for 4/19, packet sent via email ?

## 2022-01-28 ENCOUNTER — Other Ambulatory Visit: Payer: Self-pay | Admitting: *Deleted

## 2022-01-28 DIAGNOSIS — Z170 Estrogen receptor positive status [ER+]: Secondary | ICD-10-CM

## 2022-02-01 NOTE — Progress Notes (Signed)
?Radiation Oncology         [phone] ?________________________________ ? ?Initial Outpatient Consultation ? ?Name: Yolanda Morris MRN: [id number]  ?Date: 02/02/2022  DOB: [birth year] ? ?WC:BJSEG, Hal Hope, MD  [name], MD  ? ?REFERRING PHYSICIAN: [name], MD ? ?DIAGNOSIS:  ?  ICD-10-CM   ?1. Malignant neoplasm of upper-outer quadrant of right breast in female, estrogen receptor positive ([place])  C50.411   ? Z17.0   ?  ? ? Cancer Staging  ?Malignant neoplasm of upper-outer quadrant of right breast in female, estrogen receptor positive ([place]) ?Staging form: Breast, AJCC 8th Edition ?- Clinical stage from 02/02/2022: Stage IA (cT1b, cN0, cM0, G2, ER+, PR-, HER2-) - Unsigned ?Stage prefix: Initial diagnosis ?Histologic grading system: 3 grade system ? ? ?CHIEF COMPLAINT: Here to discuss management of right breast cancer ? ?HISTORY OF PRESENT ILLNESS::Yolanda Morris is a 76 y.o. female who presented with a right breast abnormality on the following imaging: bilateral screening mammogram on the date of 12/27/21.  No symptoms, if any, were reported at that time, were.  Diagnostic right breast mammogram and ultrasound on 01/10/22 further revealed a 6 x 7 x 8 mm irregular hypoechoic right breast mass, located at the 10 o'clock position, 5 cm from the nipple. No right axillary adenopathy was appreciated. ? ?Biopsy of the 10 o'clock right breast mass on the date of 01/24/22 showed grade 2 invasive ductal carcinoma measuring 0.7 cm in the greatest tumor dimension.  ER status: 30% positive with weak staining intensity; PR status negative; Proliferation marker Ki67 at 20%; Her2 status negative; Grade 2. No lymph nodes were examined.  ? ?She is here with her sister today, and in her USOH - has COPD, former smoker. ? ?PREVIOUS RADIATION THERAPY: No ? ?PAST MEDICAL HISTORY:  has a past medical history of Cancer ([place]), COPD (chronic obstructive pulmonary disease) ([place]), DM (diabetes mellitus) ([place]), Dyspnea,  Genital herpes, Hyperlipidemia, Migraines, Osteoporosis, and Unspecified essential hypertension.   ? ?PAST SURGICAL HISTORY: ?Past Surgical History:  ?Procedure Laterality Date  ? BREAST BIOPSY    ? BREAST EXCISIONAL BIOPSY    ? over 20 years ago- benign  ? COLON SURGERY    ? removal of polyps  ? DENTAL SURGERY    ? RETINAL TEAR REPAIR CRYOTHERAPY    ? ? ?FAMILY HISTORY: family history includes Alcohol abuse in her brother, father, and sister; COPD in her mother; Heart attack in her sister; Heart disease in her mother. ? ?SOCIAL HISTORY:  reports that she quit smoking about 25 years ago. Her smoking use included cigarettes. She has a 50.00 pack-year smoking history. She has never used smokeless tobacco. She reports that she does not drink alcohol and does not use drugs. ? ?ALLERGIES: Bacitracin and Levofloxacin ? ?MEDICATIONS:  ?Current Outpatient Medications  ?Medication Sig Dispense Refill  ? albuterol (PROVENTIL) (2.5 MG/3ML) 0.083% nebulizer solution Take 3 mLs (2.5 mg total) by nebulization every 6 (six) hours as needed for wheezing or shortness of breath. 360 mL 12  ? albuterol (VENTOLIN HFA) 108 (90 Base) MCG/ACT inhaler Inhale 2 puffs into the lungs every 6 (six) hours as needed for wheezing or shortness of breath. 3 each 3  ? aspirin EC 81 MG EC tablet Take 81 mg by mouth daily.    ? cetirizine (ZYRTEC) 10 MG tablet Take 10 mg by mouth daily.    ? Cholecalciferol (VITAMIN D3) 1000 UNITS CAPS Take 1 capsule by mouth daily.      ? Cranberry 500 MG  TABS Take 1 tablet by mouth daily.    ? denosumab (PROLIA) 60 MG/ML SOSY injection Inject 60 mg into the skin every 6 (six) months.    ? Fluticasone-Umeclidin-Vilant (TRELEGY ELLIPTA) 100-62.5-25 MCG/INH AEPB Inhale 1 puff into the lungs daily. 90 each 3  ? IGLUCOSE TEST STRIPS test strip See admin instructions.    ? Lancets (STERILANCE TL) MISC See admin instructions.    ? losartan (COZAAR) 50 MG tablet TAKE 1 TABLET(50 MG) BY MOUTH TWICE DAILY 180 tablet 0  ?  Lysine 500 MG CAPS Take 1 capsule by mouth daily.      ? Melatonin 5 MG CAPS Take 1 capsule by mouth daily as needed.    ? metFORMIN (GLUCOPHAGE-XR) 500 MG 24 hr tablet Take 500 mg by mouth in the morning and at bedtime.    ? Multiple Vitamins-Minerals (CENTRUM SILVER PO) Take 1 tablet by mouth daily.      ? omeprazole (PRILOSEC) 20 MG capsule Take 20 mg by mouth every other day.     ? OXYGEN Inhale into the lungs. 1-6 liters as directed    ? rosuvastatin (CRESTOR) 10 MG tablet Take 1 tablet (10 mg total) by mouth at bedtime. 90 tablet 0  ? triamcinolone cream (KENALOG) 0.5 % Apply 1 application topically 2 (two) times daily.    ? valACYclovir (VALTREX) 500 MG tablet Take 500 mg by mouth 2 (two) times daily as needed.      ? ?No current facility-administered medications for this encounter.  ? ? ?REVIEW OF SYSTEMS: As above in HPI. ?  ?PHYSICAL EXAM:  vitals were not taken for this visit.   ?General: Alert and oriented, in no acute distress ?HEENT: Head is normocephalic. Extraocular movements are intact.  ?Heart: Regular in rate and rhythm with no murmurs, rubs, or gallops. ?Chest: Clear to auscultation bilaterally, with no rhonchi, wheezes, or rales. ?Abdomen: Soft, nontender, nondistended, with no rigidity or guarding. ?Extremities: No cyanosis or edema. ?Lymphatics: see Neck Exam ?Skin: No concerning lesions. ?Musculoskeletal: symmetric strength and muscle tone throughout. ?Neurologic: Cranial nerves II through XII are grossly intact. No obvious focalities. Speech is fluent. Coordination is intact. ?Psychiatric: Judgment and insight are intact. Affect is appropriate. ?Breasts: no palpable masses appreciated in the breasts or axillae b/l . ? ? ? ?ECOG = 1 ? ?0 - Asymptomatic (Fully active, able to carry on all predisease activities without restriction) ? ?1 - Symptomatic but completely ambulatory (Restricted in physically strenuous activity but ambulatory and able to carry out work of a light or sedentary nature.  For example, light housework, office work) ? ?2 - Symptomatic, <50% in bed during the day (Ambulatory and capable of all self care but unable to carry out any work activities. Up and about more than 50% of waking hours) ? ?3 - Symptomatic, >50% in bed, but not bedbound (Capable of only limited self-care, confined to bed or chair 50% or more of waking hours) ? ?4 - Bedbound (Completely disabled. Cannot carry on any self-care. Totally confined to bed or chair) ? ?5 - Death ? ? Oken MM, Creech RH, Tormey DC, et al. [phone]). "Toxicity and response criteria of the [facility]". [place] Oncol. 5 (6): 649-55 ? ? ?LABORATORY DATA:  ?Lab Results  ?Component Value Date  ? WBC 7.7 02/02/2022  ? HGB 14.1 02/02/2022  ? HCT 43.0 02/02/2022  ? MCV 90.3 02/02/2022  ? PLT 267 02/02/2022  ? ?CMP  ?   ?Component Value Date/Time  ?  NA 140 02/02/2022 1212  ? NA 141 12/30/2021 0928  ? K 3.9 02/02/2022 1212  ? CL 101 02/02/2022 1212  ? CO2 33 (H) 02/02/2022 1212  ? GLUCOSE 202 (H) 02/02/2022 1212  ? BUN 11 02/02/2022 1212  ? BUN 11 12/30/2021 0928  ? CREATININE 0.64 02/02/2022 1212  ? CALCIUM 9.8 02/02/2022 1212  ? PROT 7.6 02/02/2022 1212  ? PROT 7.6 12/30/2021 0928  ? ALBUMIN 4.5 02/02/2022 1212  ? ALBUMIN 5.0 (H) 12/30/2021 3128  ? AST 19 02/02/2022 1212  ? ALT 18 02/02/2022 1212  ? ALKPHOS 74 02/02/2022 1212  ? BILITOT 0.4 02/02/2022 1212  ? GFRNONAA >60 02/02/2022 1212  ? ?   ? ?  ?RADIOGRAPHY: US BREAST LTD UNI RIGHT INC AXILLA ? ?Result Date: 01/10/2022 ?CLINICAL DATA:  Patient recalled from screening for right breast asymmetry. EXAM: DIGITAL DIAGNOSTIC UNILATERAL RIGHT MAMMOGRAM WITH TOMOSYNTHESIS AND CAD; ULTRASOUND RIGHT BREAST LIMITED TECHNIQUE: Right digital diagnostic mammography and breast tomosynthesis was performed. The images were evaluated with computer-aided detection.; Targeted ultrasound examination of the right breast was performed COMPARISON:  Previous exam(s). ACR Breast Density Category  c: The breast tissue is heterogeneously dense, which may obscure small masses. FINDINGS: Within the upper-outer right breast posterior depth there is a persistent focal asymmetry, further evaluated with spot

## 2022-02-02 ENCOUNTER — Ambulatory Visit (HOSPITAL_BASED_OUTPATIENT_CLINIC_OR_DEPARTMENT_OTHER): Payer: Medicare Other | Admitting: Genetic Counselor

## 2022-02-02 ENCOUNTER — Encounter: Payer: Self-pay | Admitting: Hematology and Oncology

## 2022-02-02 ENCOUNTER — Inpatient Hospital Stay: Payer: Medicare Other

## 2022-02-02 ENCOUNTER — Encounter: Payer: Self-pay | Admitting: *Deleted

## 2022-02-02 ENCOUNTER — Other Ambulatory Visit: Payer: Self-pay | Admitting: Surgery

## 2022-02-02 ENCOUNTER — Encounter: Payer: Self-pay | Admitting: Radiation Oncology

## 2022-02-02 ENCOUNTER — Ambulatory Visit
Admission: RE | Admit: 2022-02-02 | Discharge: 2022-02-02 | Disposition: A | Discharge status: Home / Self Care | Payer: Medicare Other | Source: Ambulatory Visit | Attending: Radiation Oncology | Admitting: Radiation Oncology

## 2022-02-02 ENCOUNTER — Ambulatory Visit: Payer: Medicare Other | Attending: Surgery | Admitting: Physical Therapy

## 2022-02-02 ENCOUNTER — Other Ambulatory Visit: Payer: Self-pay

## 2022-02-02 ENCOUNTER — Encounter: Payer: Self-pay | Admitting: General Practice

## 2022-02-02 ENCOUNTER — Inpatient Hospital Stay: Payer: Medicare Other | Attending: Hematology and Oncology | Admitting: Hematology and Oncology

## 2022-02-02 ENCOUNTER — Encounter: Payer: Self-pay | Admitting: Physical Therapy

## 2022-02-02 DIAGNOSIS — E119 Type 2 diabetes mellitus without complications: Secondary | ICD-10-CM | POA: Insufficient documentation

## 2022-02-02 DIAGNOSIS — C50411 Malignant neoplasm of upper-outer quadrant of right female breast: Secondary | ICD-10-CM | POA: Diagnosis not present

## 2022-02-02 DIAGNOSIS — Z8041 Family history of malignant neoplasm of ovary: Secondary | ICD-10-CM | POA: Diagnosis not present

## 2022-02-02 DIAGNOSIS — Z170 Estrogen receptor positive status [ER+]: Secondary | ICD-10-CM

## 2022-02-02 DIAGNOSIS — J449 Chronic obstructive pulmonary disease, unspecified: Secondary | ICD-10-CM | POA: Insufficient documentation

## 2022-02-02 DIAGNOSIS — R293 Abnormal posture: Secondary | ICD-10-CM | POA: Diagnosis not present

## 2022-02-02 DIAGNOSIS — Z853 Personal history of malignant neoplasm of breast: Secondary | ICD-10-CM

## 2022-02-02 DIAGNOSIS — Z9981 Dependence on supplemental oxygen: Secondary | ICD-10-CM | POA: Diagnosis not present

## 2022-02-02 DIAGNOSIS — C50911 Malignant neoplasm of unspecified site of right female breast: Secondary | ICD-10-CM | POA: Diagnosis not present

## 2022-02-02 DIAGNOSIS — Z800 Family history of malignant neoplasm of digestive organs: Secondary | ICD-10-CM | POA: Diagnosis not present

## 2022-02-02 DIAGNOSIS — Z87891 Personal history of nicotine dependence: Secondary | ICD-10-CM | POA: Diagnosis not present

## 2022-02-02 LAB — CBC WITH DIFFERENTIAL (CANCER CENTER ONLY)
Abs Immature Granulocytes: 0.01 10*3/uL (ref 0.00–0.07)
Basophils Absolute: 0 10*3/uL (ref 0.0–0.1)
Basophils Relative: 0 %
Eosinophils Absolute: 0.2 10*3/uL (ref 0.0–0.5)
Eosinophils Relative: 2 %
HCT: 43 % (ref 36.0–46.0)
Hemoglobin: 14.1 g/dL (ref 12.0–15.0)
Immature Granulocytes: 0 %
Lymphocytes Relative: 22 %
Lymphs Abs: 1.7 10*3/uL (ref 0.7–4.0)
MCH: 29.6 pg (ref 26.0–34.0)
MCHC: 32.8 g/dL (ref 30.0–36.0)
MCV: 90.3 fL (ref 80.0–100.0)
Monocytes Absolute: 0.5 10*3/uL (ref 0.1–1.0)
Monocytes Relative: 7 %
Neutro Abs: 5.3 10*3/uL (ref 1.7–7.7)
Neutrophils Relative %: 69 %
Platelet Count: 267 10*3/uL (ref 150–400)
RBC: 4.76 MIL/uL (ref 3.87–5.11)
RDW: 13.5 % (ref 11.5–15.5)
WBC Count: 7.7 10*3/uL (ref 4.0–10.5)
nRBC: 0 % (ref 0.0–0.2)

## 2022-02-02 LAB — CMP (CANCER CENTER ONLY)
ALT: 18 U/L (ref 0–44)
AST: 19 U/L (ref 15–41)
Albumin: 4.5 g/dL (ref 3.5–5.0)
Alkaline Phosphatase: 74 U/L (ref 38–126)
Anion gap: 6 (ref 5–15)
BUN: 11 mg/dL (ref 8–23)
CO2: 33 mmol/L — ABNORMAL HIGH (ref 22–32)
Calcium: 9.8 mg/dL (ref 8.9–10.3)
Chloride: 101 mmol/L (ref 98–111)
Creatinine: 0.64 mg/dL (ref 0.44–1.00)
GFR, Estimated: >60 mL/min (ref >60)
Glucose, Bld: 202 mg/dL — ABNORMAL HIGH (ref 70–99)
Potassium: 3.9 mmol/L (ref 3.5–5.1)
Sodium: 140 mmol/L (ref 135–145)
Total Bilirubin: 0.4 mg/dL (ref 0.3–1.2)
Total Protein: 7.6 g/dL (ref 6.5–8.1)

## 2022-02-02 LAB — GENETIC SCREENING ORDER

## 2022-02-02 NOTE — Assessment & Plan Note (Signed)
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  Given the very small size of the tumor, we have recommended considering surgery upfront followed by consideration for adjuvant chemotherapy based on the final pathology.  ?She understands that she may need adjuvant chemotherapy if the tumor is larger than originally observed or if there is presence of lymph node involvement.  If she does not need adjuvant chemotherapy, then she will proceed with adjuvant radiation followed by consideration for antiestrogen therapy although she may not derive great benefit from antiestrogen therapy given weak ER positivity.  She expressed understanding of all the recommendations.  She will return to clinic after surgery to discuss final pathology.  All her questions were answered to the best of my knowledge.  Thank you for consulting [place] in the care of this patient.  Please do not hesitate to contact us with any additional questions or concerns. ?

## 2022-02-02 NOTE — Progress Notes (Signed)
[facility] Psychosocial Distress Screening ?Spiritual Care ? ?Met with Yolanda Morris and her sister Yolanda Morris in Breast [facility]isciplinary Clinic to introduce Support Center team/resources, reviewing distress screen per protocol.  The patient scored a 8 on the Psychosocial Distress Thermometer which indicates severe distress. Also assessed for distress and other psychosocial needs.  ? ? 02/02/2022  ?ONCBCN DISTRESS SCREENING   ?Screening Type Initial Screening   ?Distress experienced in past week (1-10) 8 !   ?Emotional problem type Nervousness/Anxiety;Adjusting to illness   ?Information Concerns Type Lack of info about diagnosis;Lack of info about treatment   ?Physical Problem type Sleep/insomnia   ?Referral to support programs Yes   ?  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 She reports good support from family and church, which is very meaningful to her. ? ?Follow up needed: No. Per [name], no other needs at this time. She plans to reach out as needed/desired for follow-up support. ? ? ?Chaplain Lisa Lundeen, MDiv, BCC ?Pager [phone] ?Voicemail [phone] ? ? ? ? ?  ?

## 2022-02-02 NOTE — Research (Signed)
[employer] 2021-05 - Specimen Collection Study to Evaluate Biomarkers in Subjects with Cancer   ? ?Patient Yolanda Morris was identified by Dr. Chryl Heck as a potential candidate for the above listed study.  This Clinical Research Nurse met with VANESHA ATHENS, [phone], on 02/02/22 in a manner and location that ensures patient privacy to discuss participation in the above listed research study.  Patient is Accompanied by her sister .  A copy of the informed consent document with embedded HIPAA language was provided to the patient.  Patient reads, speaks, and understands [place].   ?Patient was provided with the business card of this Nurse and encouraged to contact the research team with any questions.  Approximately 25 minutes was spent with the patient reviewing the informed consent documents.  Patient was provided the option of taking informed consent documents home to review and was encouraged to review at their convenience with their support network, including other care providers. Patient took the consent documents home to review.  The pt was informed that her participation is voluntary.  The pt's sister asked how many tubes of blood will be drawn.  The nurse informed the pt that this study will require 6 tubes of blood.  The pt was informed that her blood must be drawn before she receives any treatment for her breast cancer, including surgery.  The pt verbalized understanding.  The pt was told that she will received a $50 gift card once her blood is drawn and her tissue is requested.  The pt and the nurse agreed to speak on Monday, 02/07/22 to discuss her participation in the study.  The pt was thanked for her willingness to consider participation in the study.  The nurse gave the pt's sister (a retired Marine scientist) a copy of the consent form to read as well.   ?Brion Aliment RN, BSN, CCRP ?Clinical Research Nurse Lead ?02/02/2022 4:22 PM   ?

## 2022-02-02 NOTE — Therapy (Signed)
?OUTPATIENT PHYSICAL THERAPY BREAST CANCER BASELINE EVALUATION ? ? ?Patient Name: Yolanda Morris ?MRN: [id number] ?DOB:11/17/1945, 76 y.o., female ?Today's Date: 02/02/2022 ? ? PT End of Session - 02/02/22 1624   ? ? Visit Number 1   ? Number of Visits 2   ? Date for PT Re-Evaluation 03/30/22   ? PT Start Time 9767   ? PT Stop Time 3419   ? PT Time Calculation (min) 36 min   ? Activity Tolerance Patient tolerated treatment well   ? Behavior During Therapy [facility] for tasks assessed/performed   ? ?  ?  ? ?  ? ? ?Past Medical History:  ?Diagnosis Date  ? Cancer [facility])   ? skin  ? COPD (chronic obstructive pulmonary disease) ([place])   ? DM (diabetes mellitus) ([place])   ? Dyspnea   ? increased activity   ? Genital herpes   ? Hyperlipidemia   ? Migraines   ? Osteoporosis   ? Unspecified essential hypertension   ? ?Past Surgical History:  ?Procedure Laterality Date  ? BREAST BIOPSY    ? BREAST EXCISIONAL BIOPSY    ? over 20 years ago- benign  ? COLON SURGERY    ? removal of polyps  ? DENTAL SURGERY    ? RETINAL TEAR REPAIR CRYOTHERAPY    ? ?Patient Active Problem List  ? Diagnosis Date Noted  ? Malignant neoplasm of upper-outer quadrant of right breast in female, estrogen receptor positive ([place]) 01/28/2022  ? Medication management 07/21/2020  ? Healthcare maintenance 07/21/2020  ? Body aches 07/22/2019  ? Chronic respiratory failure with hypoxia ([place]) 06/17/2019  ? Allergic rhinitis 11/13/2018  ? Shortness of breath 10/23/2018  ? COPD exacerbation ([place]) 01/07/2015  ? Cough 07/05/2013  ? Acute sinusitis 02/12/2013  ? Acute bronchitis 07/08/2011  ? 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) 01/05/2011  ? ? ?PCP: [name], MD ? ?REFERRING PROVIDER: [name], MD ? ?REFERRING DIAG: Right breast cancer ? ?THERAPY DIAG:  ?Abnormal posture ? ?Malignant neoplasm of upper-outer quadrant of right breast in female, estrogen receptor positive ([place]) ? ?ONSET DATE: 12/27/2021 ? ?SUBJECTIVE                                                                                                                                                                                           ? ?SUBJECTIVE STATEMENT: ?Patient reports she is here today to be seen by her medical team for her newly diagnosed right breast cancer.  ? ?PERTINENT HISTORY:  ?Patient was diagnosed on 12/27/2021 with right grade 2 invasive ductal carcinoma breast cancer. It measures 8 mm and is located in  the upper outer quadrant. It is weakly ER positive, PR negative, and HER2 negative with a Ki67 of 20%. She has stage 4 COPD and is on 2 liters of oxygen. ? ?PATIENT GOALS   reduce lymphedema risk and learn post op HEP.  ? ?PAIN:  ?Are you having pain? Yes: NPRS scale: 4-5/10 ?Pain location: mid back in thoracic spine from previous T7 compression fracture ?Pain description: achy but sharp with rotation movements ?Aggravating factors: rotating ?Relieving factors: heat ? ? ?PRECAUTIONS: Active CA Other: On 2L of O2; COPD ? ?HAND DOMINANCE: right ? ?WEIGHT BEARING RESTRICTIONS No ? ?FALLS:  ?Has patient fallen in last 6 months? No ? ?LIVING ENVIRONMENT: ?Patient lives with: alone ?Lives in: House/apartment ?Has following equipment at home: None ? ?OCCUPATION: Retired ? ?LEISURE: She does not exercise ? ?PRIOR LEVEL OF FUNCTION: Independent ? ? ?OBJECTIVE ? ?COGNITION: ? Overall cognitive status: Within functional limits for tasks assessed   ? ?POSTURE:  ?Forward head and rounded shoulders posture with significant kyphosis ? ?UPPER EXTREMITY AROM/PROM: ? ?A/PROM RIGHT  02/02/2022 ?  ?Shoulder extension 47  ?Shoulder flexion 117 limited by kyphosis  ?Shoulder abduction 131  ?Shoulder internal rotation 74  ?Shoulder external rotation 74  ?  (Blank rows = not tested) ? ?A/PROM LEFT  02/02/2022  ?Shoulder extension 56  ?Shoulder flexion 114  ?Shoulder abduction 120  ?Shoulder internal rotation 72  ?Shoulder external rotation 73  ?  (Blank rows = not tested) ? ? ?CERVICAL AROM: ?All within normal  limits ? ?UPPER EXTREMITY STRENGTH: WNL ? ? ?LYMPHEDEMA ASSESSMENTS:  ? ?[place] RIGHT  02/02/2022  ?10 cm proximal to olecranon process 25.8  ?Olecranon process 23.4  ?10 cm proximal to ulnar styloid process 19  ?Just proximal to ulnar styloid process 14.3  ?Across hand at thumb web space 16.9  ?At base of 2nd digit 6.1  ?(Blank rows = not tested) ? ?[place] LEFT  02/02/2022  ?10 cm proximal to olecranon process 26.2  ?Olecranon process 23.4  ?10 cm proximal to ulnar styloid process 18.5  ?Just proximal to ulnar styloid process 13.9  ?Across hand at thumb web space 17.4  ?At base of 2nd digit 6.4  ?(Blank rows = not tested) ? ? ?L-DEX LYMPHEDEMA SCREENING: ? ?The patient was assessed using the L-Dex machine today to produce a lymphedema index baseline score. The patient will be reassessed on a regular basis (typically every 3 months) to obtain new L-Dex scores. If the score is > 6.5 points away from his/her baseline score indicating onset of subclinical lymphedema, it will be recommended to wear a compression garment for 4 weeks, 12 hours per day and then be reassessed. If the score continues to be > 6.5 points from baseline at reassessment, we will initiate lymphedema treatment. Assessing in this manner has a 95% rate of preventing clinically significant lymphedema. ? ? L-DEX FLOWSHEETS - 02/02/22 1600   ? ?  ? L-DEX LYMPHEDEMA SCREENING  ? Measurement Type Unilateral   ? L-DEX MEASUREMENT EXTREMITY Upper Extremity   ? POSITION  Standing   ? DOMINANT SIDE Right   ? At Risk Side Right   ? BASELINE SCORE (UNILATERAL) -9   ? ?  ?  ? ?  ? ? ? ?QUICK DASH SURVEY: ? Katina Dung - 02/02/22 0001   ? ? Open a tight or new jar Severe difficulty   ? Do heavy household chores (wash walls, wash floors) Moderate difficulty   ? Carry a shopping bag or briefcase Mild  difficulty   ? Wash your back No difficulty   ? Use a knife to cut food No difficulty   ? Recreational activities in which you take some force or impact through your  arm, shoulder, or hand (golf, hammering, tennis) Unable   ? During the past week, to what extent has your arm, shoulder or hand problem interfered with your normal social activities with family, friends, neighbors, or groups? Not at all   ? During the past week, to what extent has your arm, shoulder or hand problem limited your work or other regular daily activities Not at all   ? Arm, shoulder, or hand pain. None   ? Tingling (pins and needles) in your arm, shoulder, or hand None   ? Difficulty Sleeping No difficulty   ? DASH Score 22.73 %   ? ?  ?  ? ?  ? ? ? ?PATIENT EDUCATION:  ?Education details: Lymphedema risk reduction and post op shoulder/posture HEP ?Person educated: Patient ?Education method: Explanation, Demonstration, Handout ?Education comprehension: Patient verbalized understanding and returned demonstration ? ? ?HOME EXERCISE PROGRAM: ?Patient was instructed today in a home exercise program today for post op shoulder range of motion. These included active assist shoulder flexion in sitting, scapular retraction, wall walking with shoulder abduction, and hands behind head external rotation.  She was encouraged to do these twice a day, holding 3 seconds and repeating 5 times when permitted by her physician. ? ? ?ASSESSMENT: ? ?CLINICAL IMPRESSION: ?Patient was diagnosed on 12/27/2021 with right grade 2 invasive ductal carcinoma breast cancer. It measures 8 mm and is located in the upper outer quadrant. It is weakly ER positive, PR negative, and HER2 negative with a Ki67 of 20%. She has stage 4 COPD and is on 2 liters of oxygen.Her multidisciplinary medical team met prior to her assessments to determine a recommended treatment plan. She is planning to have a right lumpectomy and sentinel node biopsy followed by radiation and anti-estrogen therapy. She will benefit from a post op PT reassessment to determine needs and from L-Dex screens every 3 months for 2 years to detect subclinical lymphedema. ? ?Pt  will benefit from skilled therapeutic intervention to improve on the following deficits: Decreased knowledge of precautions, impaired UE functional use, pain, decreased ROM, postural dysfunction.  ? ?PT treat

## 2022-02-02 NOTE — Progress Notes (Signed)
[facility] ?CONSULT NOTE ? ?Patient Care Team: ?[name], MD as PCP - General (Family Medicine) ?Rockwell Germany, RN as Oncology Nurse Navigator ?Mauro Kaufmann, RN as Oncology Nurse Navigator ?[name], MD as Consulting Physician (General Surgery) ?[name], MD as Consulting Physician (Hematology and Oncology) ?Eppie Gibson, MD as Attending Physician (Radiation Oncology) ? ?CHIEF COMPLAINTS/PURPOSE OF CONSULTATION:  ?Newly diagnosed breast cancer ? ?HISTORY OF PRESENTING ILLNESS:  ?Yolanda Morris 76 y.o. female is here because of recent diagnosis of right breast IDC ? ?I reviewed her records extensively and collaborated the history with the patient. ? ?SUMMARY OF ONCOLOGIC HISTORY: ?Oncology History  ?Malignant neoplasm of upper-outer quadrant of right breast in female, estrogen receptor positive ([place])  ?12/27/2021 Mammogram  ? Screening mammogram showed a right breast asymmetry warranting further investigation. ?Diagnostic mammogram showed 6 x 7 x 8 mm irregular hypoechoic right breast mass at 10 o'clock position 5 cm from the nipple.  No right axillary adenopathy ?  ?01/24/2022 Pathology Results  ? Pathology results showed right breast invasive ductal carcinoma, grade 2.  Prognostic showed ER 30%, positive weak staining intensity.  PR 0%, negative.  Ki-67 of 20%.  Tumor cells are negative for HER2 1+  ?  ?01/28/2022 Initial Diagnosis  ? Malignant neoplasm of upper-outer quadrant of right breast in female, estrogen receptor positive ([place]) ?  ? ?Patient arrived to the appointment today with her sister.  She denies any new complaints.  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  Her diabetes is very well controlled, last hemoglobin A1c about 6.5.  She otherwise is pretty functional. ?Rest of the pertinent 10 point ROS reviewed and negative ? ?MEDICAL HISTORY:  ?Past Medical History:  ?Diagnosis Date  ? Cancer [facility])   ? skin  ? COPD  (chronic obstructive pulmonary disease) ([place])   ? DM (diabetes mellitus) ([place])   ? Dyspnea   ? increased activity   ? Genital herpes   ? Hyperlipidemia   ? Migraines   ? Osteoporosis   ? Unspecified essential hypertension   ? ? ?SURGICAL HISTORY: ?Past Surgical History:  ?Procedure Laterality Date  ? BREAST BIOPSY    ? BREAST EXCISIONAL BIOPSY    ? over 20 years ago- benign  ? COLON SURGERY    ? removal of polyps  ? DENTAL SURGERY    ? RETINAL TEAR REPAIR CRYOTHERAPY    ? ? ?SOCIAL HISTORY: ?Social History  ? ?Socioeconomic History  ? Marital status: Widowed  ?  Spouse name: Not on file  ? Number of children: Not on file  ? Years of education: Not on file  ? Highest education level: Not on file  ?Occupational History  ? Occupation: Development worker, community  ?Tobacco Use  ? Smoking status: Former  ?  Packs/day: 2.00  ?  Years: 25.00  ?  Pack years: 50.00  ?  Types: Cigarettes  ?  Quit date: 10/17/1996  ?  Years since quitting: 25.3  ? Smokeless tobacco: Never  ?Vaping Use  ? Vaping Use: Never used  ?Substance and Sexual Activity  ? Alcohol use: No  ?  Alcohol/week: 0.0 standard drinks  ? Drug use: No  ? Sexual activity: Not on file  ?Other Topics Concern  ? Not on file  ?Social History Narrative  ? No children  ? ?Social Determinants of Health  ? ?Financial Resource Strain: Not on file  ?Food Insecurity: Not on file  ?  Transportation Needs: Not on file  ?Physical Activity: Not on file  ?Stress: Not on file  ?Social Connections: Not on file  ?Intimate Partner Violence: Not on file  ? ? ?FAMILY HISTORY: ?Family History  ?Problem Relation Age of Onset  ? COPD Mother   ?     deceased  ? Heart disease Mother   ? Alcohol abuse Father   ? Alcohol abuse Sister   ?     deceased  ? Heart attack Sister   ? Alcohol abuse Brother   ?     deceased  ? Breast cancer Neg Hx   ? ? ?ALLERGIES:  is allergic to bacitracin and levofloxacin. ? ?MEDICATIONS:  ?Current Outpatient Medications  ?Medication Sig Dispense Refill  ? albuterol  (PROVENTIL) (2.5 MG/3ML) 0.083% nebulizer solution Take 3 mLs (2.5 mg total) by nebulization every 6 (six) hours as needed for wheezing or shortness of breath. 360 mL 12  ? albuterol (VENTOLIN HFA) 108 (90 Base) MCG/ACT inhaler Inhale 2 puffs into the lungs every 6 (six) hours as needed for wheezing or shortness of breath. 3 each 3  ? aspirin EC 81 MG EC tablet Take 81 mg by mouth daily.    ? cetirizine (ZYRTEC) 10 MG tablet Take 10 mg by mouth daily.    ? Cholecalciferol (VITAMIN D3) 1000 UNITS CAPS Take 1 capsule by mouth daily.      ? Cranberry 500 MG TABS Take 1 tablet by mouth daily.    ? denosumab (PROLIA) 60 MG/ML SOSY injection Inject 60 mg into the skin every 6 (six) months.    ? Fluticasone-Umeclidin-Vilant (TRELEGY ELLIPTA) 100-62.5-25 MCG/INH AEPB Inhale 1 puff into the lungs daily. 90 each 3  ? IGLUCOSE TEST STRIPS test strip See admin instructions.    ? Lancets (STERILANCE TL) MISC See admin instructions.    ? losartan (COZAAR) 50 MG tablet TAKE 1 TABLET(50 MG) BY MOUTH TWICE DAILY 180 tablet 0  ? Lysine 500 MG CAPS Take 1 capsule by mouth daily.      ? Melatonin 5 MG CAPS Take 1 capsule by mouth daily as needed.    ? metFORMIN (GLUCOPHAGE-XR) 500 MG 24 hr tablet Take 500 mg by mouth in the morning and at bedtime.    ? Multiple Vitamins-Minerals (CENTRUM SILVER PO) Take 1 tablet by mouth daily.      ? omeprazole (PRILOSEC) 20 MG capsule Take 20 mg by mouth every other day.     ? OXYGEN Inhale into the lungs. 1-6 liters as directed    ? rosuvastatin (CRESTOR) 10 MG tablet Take 1 tablet (10 mg total) by mouth at bedtime. 90 tablet 0  ? triamcinolone cream (KENALOG) 0.5 % Apply 1 application topically 2 (two) times daily.    ? valACYclovir (VALTREX) 500 MG tablet Take 500 mg by mouth 2 (two) times daily as needed.      ? ?No current facility-administered medications for this visit.  ? ? ?REVIEW OF SYSTEMS:   ?Constitutional: Denies fevers, chills or abnormal night sweats ?Eyes: Denies blurriness of  vision, double vision or watery eyes ?Ears, nose, mouth, throat, and face: Denies mucositis or sore throat ?Respiratory: Denies cough, dyspnea or wheezes ?Cardiovascular: Denies palpitation, chest discomfort or lower extremity swelling ?Gastrointestinal:  Denies nausea, heartburn or change in bowel habits ?Skin: Denies abnormal skin rashes ?Lymphatics: Denies new lymphadenopathy or easy bruising ?Neurological:Denies numbness, tingling or new weaknesses ?Behavioral/Psych: Mood is stable, no new changes  ?Breast: Denies any palpable lumps or discharge ?All  other systems were reviewed with the patient and are negative. ? ?PHYSICAL EXAMINATION: ?ECOG PERFORMANCE STATUS: 0 - Asymptomatic ? ?Vitals:  ? 02/02/22 1239  ?BP: (!) 161/95  ?Pulse: (!) 104  ?Resp: 18  ?Temp: 99.3 ?F (37.4 ?C)  ?SpO2: 96%  ? ?Filed Weights  ? 02/02/22 1239  ?Weight: 154 lb 9.6 oz (70.1 kg)  ? ? ?GENERAL:alert, no distress and comfortable ?SKIN: skin color, texture, turgor are normal, no rashes or significant lesions ?EYES: normal, conjunctiva are pink and non-injected, sclera clear ?OROPHARYNX:no exudate, no erythema and lips, buccal mucosa, and tongue normal  ?NECK: supple, thyroid normal size, non-tender, without nodularity ?LYMPH:  no palpable lymphadenopathy in the cervical, axillary region ?LUNGS: clear to auscultation and percussion with normal breathing effort ?HEART: regular rate & rhythm and no murmurs and no lower extremity edema ?ABDOMEN:abdomen soft, non-tender and normal bowel sounds ?Musculoskeletal:no cyanosis of digits and no clubbing  ?PSYCH: alert & oriented x 3 with fluent speech ?NEURO: no focal motor/sensory deficits ?BREAST: Postbiopsy changes.  No definitive palpable mass.  No regional adenopathy. ? ? ?LABORATORY DATA:  ?I have reviewed the data as listed ?Lab Results  ?Component Value Date  ? WBC 7.7 02/02/2022  ? HGB 14.1 02/02/2022  ? HCT 43.0 02/02/2022  ? MCV 90.3 02/02/2022  ? PLT 267 02/02/2022  ? ?Lab Results   ?Component Value Date  ? NA 140 02/02/2022  ? K 3.9 02/02/2022  ? CL 101 02/02/2022  ? CO2 33 (H) 02/02/2022  ? ? ?RADIOGRAPHIC STUDIES: ?I have personally reviewed the radiological reports and agreed with the findings in

## 2022-02-03 ENCOUNTER — Other Ambulatory Visit: Payer: Self-pay | Admitting: Surgery

## 2022-02-03 ENCOUNTER — Encounter: Payer: Self-pay | Admitting: *Deleted

## 2022-02-03 ENCOUNTER — Telehealth: Payer: Self-pay | Admitting: *Deleted

## 2022-02-03 ENCOUNTER — Encounter: Payer: Self-pay | Admitting: Genetic Counselor

## 2022-02-03 DIAGNOSIS — Z800 Family history of malignant neoplasm of digestive organs: Secondary | ICD-10-CM

## 2022-02-03 DIAGNOSIS — Z853 Personal history of malignant neoplasm of breast: Secondary | ICD-10-CM

## 2022-02-03 DIAGNOSIS — Z8041 Family history of malignant neoplasm of ovary: Secondary | ICD-10-CM | POA: Insufficient documentation

## 2022-02-03 HISTORY — DX: Family history of malignant neoplasm of digestive organs: Z80.0

## 2022-02-03 HISTORY — DX: Family history of malignant neoplasm of ovary: Z80.41

## 2022-02-03 NOTE — Telephone Encounter (Signed)
Spoke to pt concerning [place] from 02/02/22. Denies questions or needs regarding dx or treatment care plan. Encourage pt to call with needs. Received verbal understanding. ? ?Pt informed she plans on participating in research study present by Lexine Baton, Therapist, sports. Msg sent to [facility]. ?

## 2022-02-03 NOTE — Progress Notes (Signed)
REFERRING PROVIDER: ?[name], MD ?[address] ?[place],  [place] [id number] ? ?PRIMARY PROVIDER:  ?[name], MD ? ?PRIMARY REASON FOR VISIT:  ?1. Malignant neoplasm of upper-outer quadrant of right breast in female, estrogen receptor positive ([place])   ?2. Family history of ovarian cancer   ?3. Family history of stomach cancer   ? ? ?HISTORY OF PRESENT ILLNESS:   ?Yolanda Morris, a 76 y.o. female, was seen for a [place] cancer genetics consultation during the breast multidisciplinary clinic at the request of Dr. Chryl Heck due to a personal and family history of cancer.  Yolanda Morris presents to clinic today to discuss the possibility of a hereditary predisposition to cancer, to discuss genetic testing, and to further clarify her future cancer risks, as well as potential cancer risks for family members.  ? ?In 2023, at the age of 76, Yolanda Morris was diagnosed with invasive ductal carcinoma of the right breast (ER+ 30% weak/PR-/HER2-) . The treatment plan is pending.  She also has a history of skin cancer, including basal cell carcinoma on her face.  ? ?CANCER HISTORY:  ?Oncology History  ?Malignant neoplasm of upper-outer quadrant of right breast in female, estrogen receptor positive ([place])  ?12/27/2021 Mammogram  ? Screening mammogram showed a right breast asymmetry warranting further investigation. ?Diagnostic mammogram showed 6 x 7 x 8 mm irregular hypoechoic right breast mass at 10 o'clock position 5 cm from the nipple.  No right axillary adenopathy ?  ?01/24/2022 Pathology Results  ? Pathology results showed right breast invasive ductal carcinoma, grade 2.  Prognostic showed ER 30%, positive weak staining intensity.  PR 0%, negative.  Ki-67 of 20%.  Tumor cells are negative for HER2 1+  ?  ?01/28/2022 Initial Diagnosis  ? Malignant neoplasm of upper-outer quadrant of right breast in female, estrogen receptor positive ([place]) ?  ? ? ? ?RISK FACTORS:  ?Colonoscopy: yes;  most recent in 2019 . ?Menarche was at age  69.  ?Nulliparous.  ?Menopausal status: postmenopausal.  ?OCP use for approximately 5 years.  ?HRT use: 0 years. ? ? ?Past Medical History:  ?Diagnosis Date  ? Cancer [facility])   ? skin  ? COPD (chronic obstructive pulmonary disease) ([place])   ? DM (diabetes mellitus) ([place])   ? Dyspnea   ? increased activity   ? Family history of ovarian cancer 02/03/2022  ? Family history of stomach cancer 02/03/2022  ? Genital herpes   ? Hyperlipidemia   ? Migraines   ? Osteoporosis   ? Unspecified essential hypertension   ? ? ?Past Surgical History:  ?Procedure Laterality Date  ? BREAST BIOPSY    ? BREAST EXCISIONAL BIOPSY    ? over 20 years ago- benign  ? COLON SURGERY    ? removal of polyps  ? DENTAL SURGERY    ? RETINAL TEAR REPAIR CRYOTHERAPY    ? ? ? ?FAMILY HISTORY:  ?We obtained a detailed, 4-generation family history.  Significant diagnoses are listed below: ?Family History  ?Problem Relation Age of Onset  ? Cancer Maternal Aunt   ?     unknown type; d. 44  ? Cancer Maternal Uncle   ?     unknown type; d. 24s  ? Ovarian cancer Paternal Aunt   ?     d. 63  ? Stomach cancer Paternal Aunt 17  ? ? ? ?Yolanda Morris is unaware of previous family history of genetic testing for hereditary cancer risks. There is no reported Ashkenazi Jewish ancestry. There is  no known consanguinity. ? ?GENETIC COUNSELING ASSESSMENT: Yolanda Morris is a 76 y.o. female with a personal and family history of cancer which is somewhat suggestive of a hereditary cancer syndrome and predisposition to cancer given the presence of related cancers in the family. We, therefore, discussed and recommended the following at today's visit.  ? ?DISCUSSION: We discussed that 5 - 10% of cancer is hereditary, with most cases of hereditary breast cancer associated with mutations in BRCA1/2.  There are other genes that can be associated with hereditary breast and ovarian cancer syndromes. Type of cancer risk and level of risk are gene-specific. We discussed that testing is  beneficial for several reasons including knowing how to follow individuals after completing their treatment, identifying whether potential treatment options would be beneficial, and understanding if other family members could be at risk for cancer and allowing them to undergo genetic testing.  ? ?We reviewed the characteristics, features and inheritance patterns of hereditary cancer syndromes. We also discussed genetic testing, including the appropriate family members to test, the process of testing, insurance coverage and turn-around-time for results. We discussed the implications of a negative, positive and/or variant of uncertain significant result. In order to get genetic test results in a timely manner so that Yolanda Morris can use these genetic test results for surgical decisions, we recommended Yolanda Morris pursue genetic testing for the [facility] BRCAPlus Panel.  The BRCAplus panel offered by [employer] and includes sequencing and deletion/duplication analysis for the following 8 genes: ATM, BRCA1, BRCA2, CDH1, CHEK2, PALB2, PTEN, and TP53.  Once complete, we recommend Yolanda Morris pursue reflex genetic testing to a more comprehensive gene panel.  ? ?Yolanda Morris  was offered a common hereditary cancer panel (47 genes) and an expanded pan-cancer panel (77 genes). Yolanda Morris was informed of the benefits and limitations of each panel, including that expanded pan-cancer panels contain genes that do not have clear management guidelines at this point in time.  We also discussed that as the number of genes included on a panel increases, the chances of variants of uncertain significance increases.  After considering the benefits and limitations of each gene panel, Yolanda Morris  elected to have a common hereditary cancer panel through [facility]. ? ?The CustomNext-Cancer+RNAinsight panel offered by [facility] includes sequencing and rearrangement analysis for the following 47 genes:  APC, ATM, AXIN2, BARD1, BMPR1A, BRCA1,  BRCA2, BRIP1, CDH1, CDK4, CDKN2A, CHEK2, DICER1, EPCAM, GREM1, HOXB13, MEN1, MLH1, MSH2, MSH3, MSH6, MUTYH, NBN, NF1, NF2, NTHL1, PALB2, PMS2, POLD1, POLE, PTEN, RAD51C, RAD51D, RECQL, RET, SDHA, SDHAF2, SDHB, SDHC, SDHD, SMAD4, SMARCA4, STK11, TP53, TSC1, TSC2, and VHL.  RNA data is routinely analyzed for use in variant interpretation for all genes. ? ?Based on Yolanda Morris's personal and family history of cancer, she meets medical criteria for genetic testing.  ? ?PLAN: After considering the risks, benefits, and limitations, Yolanda Morris provided informed consent to pursue genetic testing and the blood sample was sent to [facility] for analysis of the BRCAPlus and CustomNext-Cancer +RNAinsight Panel. Results should be available within approximately 1-2 weeks' time, at which point they will be disclosed by telephone to Yolanda Morris, as will any additional recommendations warranted by these results. Yolanda Morris will receive a summary of her genetic counseling visit and a copy of her results once available. This information will also be available in Epic.  ? ?Lastly, we encouraged Yolanda Morris to remain in contact with cancer genetics annually so that we can continuously update the family history  and inform her of any changes in cancer genetics and testing that may be of benefit for this family.  ? ?Yolanda Morris's questions were answered to her satisfaction today. Our contact information was provided should additional questions or concerns arise. Thank you for the referral and allowing [place] to share in the care of your patient.  ? ?Lashundra Shiveley M. Joette Catching, [place], [place] ?Genetic Counselor ?[email or web address] ?(P) [phone] ? ?The patient was seen for a total of 20 minutes in face-to-face genetic counseling.  The patient was accompanied by her sister, Tamela Oddi.  Drs.Lindi Adie and/or Burr Medico were available to discuss this case as needed.  ?_______________________________________________________________________ ?For Office  Staff:  ?Number of people involved in session: 2 ?Was an Intern/ student involved with case: no ? ?

## 2022-02-04 ENCOUNTER — Telehealth: Payer: Self-pay | Admitting: Hematology and Oncology

## 2022-02-04 ENCOUNTER — Telehealth: Payer: Self-pay

## 2022-02-04 NOTE — Telephone Encounter (Signed)
Pt called asking for clarification on upcoming appointments. Verified with pt upcoming appointment date/time/location. Pt verbalized understanding, no further questions.  ?

## 2022-02-04 NOTE — Telephone Encounter (Signed)
.  Called patient to schedule appointment per 4/21 inbasket, patient is aware of date and time.   ?

## 2022-02-07 ENCOUNTER — Encounter: Payer: Self-pay | Admitting: Internal Medicine

## 2022-02-07 ENCOUNTER — Other Ambulatory Visit: Payer: Self-pay | Admitting: *Deleted

## 2022-02-07 ENCOUNTER — Ambulatory Visit (INDEPENDENT_AMBULATORY_CARE_PROVIDER_SITE_OTHER): Payer: Medicare Other | Admitting: Internal Medicine

## 2022-02-07 ENCOUNTER — Telehealth: Payer: Self-pay | Admitting: *Deleted

## 2022-02-07 VITALS — BP 130/78 | HR 91 | Ht 63.0 in | Wt 154.4 lb

## 2022-02-07 DIAGNOSIS — C50411 Malignant neoplasm of upper-outer quadrant of right female breast: Secondary | ICD-10-CM

## 2022-02-07 DIAGNOSIS — J9611 Chronic respiratory failure with hypoxia: Secondary | ICD-10-CM | POA: Diagnosis not present

## 2022-02-07 DIAGNOSIS — Z01811 Encounter for preprocedural respiratory examination: Secondary | ICD-10-CM

## 2022-02-07 DIAGNOSIS — J449 Chronic obstructive pulmonary disease, unspecified: Secondary | ICD-10-CM

## 2022-02-07 NOTE — Progress Notes (Signed)
Pt as chronic COPD and is 02 dependent at home. Per [facility] guidelines this pt is not an appropriate candidate for day surgery and would be better suited having her surgery at the main hospital. Also reviewed her chart Dr.Germeroth and he also agrees she should be done in the main OR.  ?Called and spoke with Abigail Butts at the office and she is aware.  ?

## 2022-02-07 NOTE — Patient Instructions (Addendum)
Please schedule follow up scheduled with myself in 4 months.  If my schedule is not open yet, we will contact you with a reminder closer to that time. Please call [phone] if you haven't heard from [place] a month before.  ? ?Good luck with your surgery next week! Please let us know if there's anything you need.  ?

## 2022-02-07 NOTE — Telephone Encounter (Signed)
[employer] 2021-05 - Specimen Collection Study to Evaluate Biomarkers in Subjects with Cancer   ?  ?The research nurse called the pt at noon today, and the pt was not available by phone.  The nurse left a message asking the pt to contact the nurse about the [employer] consent form.  The pt returned the nurse's call this afternoon about her study participation in this specimen and data collection study.  The pt said that she read the consent form several times, and she was comfortable participating in this study.  She said that her sister has been in a research study, and she supports her decision to be in this study.  The nurse answered all of her questions about the consent form.  The pt agreed to meet with the research nurse on Wednesday, 02/09/22, at 9 am to review and sign the consent form.  The pt was thanked for her willingness to participate in the study. The pt was informed that she will receive a $50 gift card once she signs the consent form and after her research blood is drawn. The pt verbalized understanding of the study procedures.  ? ?Brion Aliment RN, BSN, CCRP ?Clinical Research Nurse Lead ?02/07/2022 4:21 PM   ?

## 2022-02-07 NOTE — Progress Notes (Signed)
? ? ?      ?Yolanda Morris    [id number]    Jun 07, 1946 ? ?Primary Care Physician:Smith, Hal Hope, MD ?Date of Appointment: 02/07/2022 ?Established Patient Visit ? ?Chief complaint:   ?Chief Complaint  ?Patient presents with  ? Follow-up  ?  3 mo f/u for COPD. States her breathing has stable since last visit. Was recently diagnosed with breast cancer.   ? ? ? ?HPI: ?Yolanda Morris is a 76 y.o. woman with history COPD and COVID infection. On home oxygen 2LNC.   ? ?Lives at home with her dog. Husband passed away in 14-Jan-2018 from cancer ?DME [employer]  ? ?She has done pulmonary rehabilitation twice and really enjoyed it.  ? ?Interval Updates: ?Had URI in Jan 2023 and recovered from this.  ? ?She has been diagnosed with breast cancer from routine mammography and is having surgery next week - right lumpectomy and lymph node dissection.  ? ?She is also having pain in her neck with movement since yesterday which is worse with movement.  ?  ?She is nervous about having anesthesia in the setting of her COPD.  ? ?Still on trelegy one puff once a day. Albuterol nebulizer use is once a day or less.  ?No other flares of COPD.  ?Does have dyspnea with exertion but able to complete her ADLs.  ? ?I have reviewed the patient's family social and past medical history and updated as appropriate.  ? ?Past Medical History:  ?Diagnosis Date  ? Cancer [facility])   ? skin  ? COPD (chronic obstructive pulmonary disease) ([place])   ? DM (diabetes mellitus) ([place])   ? Dyspnea   ? increased activity   ? Family history of ovarian cancer 02/03/2022  ? Family history of stomach cancer 02/03/2022  ? Genital herpes   ? Hyperlipidemia   ? Migraines   ? Osteoporosis   ? Unspecified essential hypertension   ? ? ?Past Surgical History:  ?Procedure Laterality Date  ? BREAST BIOPSY    ? BREAST EXCISIONAL BIOPSY    ? over 20 years ago- benign  ? COLON SURGERY    ? removal of polyps  ? DENTAL SURGERY    ? RETINAL TEAR REPAIR CRYOTHERAPY    ? ? ?Family History  ?Problem  Relation Age of Onset  ? COPD Mother   ?     deceased  ? Heart disease Mother   ? Alcohol abuse Father   ? Alcohol abuse Sister   ?     deceased  ? Heart attack Sister   ? Alcohol abuse Brother   ?     deceased  ? Cancer Maternal Aunt   ?     unknown type; d. 28  ? Cancer Maternal Uncle   ?     unknown type; d. 42s  ? Ovarian cancer Paternal Aunt   ?     d. 20  ? Stomach cancer Paternal Aunt 80  ? Breast cancer Neg Hx   ? ? ?Social History  ? ?Occupational History  ? Occupation: Development worker, community  ?Tobacco Use  ? Smoking status: Former  ?  Packs/day: 2.00  ?  Years: 25.00  ?  Pack years: 50.00  ?  Types: Cigarettes  ?  Quit date: 10/17/1996  ?  Years since quitting: 25.3  ? Smokeless tobacco: Never  ?Vaping Use  ? Vaping Use: Never used  ?Substance and Sexual Activity  ? Alcohol use: No  ?  Alcohol/week: 0.0 standard drinks  ?  Drug use: No  ? Sexual activity: Not on file  ? ? ? ?Physical Exam: ?Blood pressure 130/78, pulse 91, height '5\' 3"'$  (1.6 m), weight 154 lb 6.4 oz (70 kg), SpO2 96 %. ?Gen: well appearing, speaks in full sentences ?CV: RRR no mrg ?Resp: diminished, no increased wob, no wheezes or crackles ? ? ?Data Reviewed: ?Imaging: ?I have personally reviewed the chest xray October 2021 which shows hyperinflation  ? ?PFTs: ? ? ? ?  Latest Ref Rng & Units 09/09/2019  ?  9:02 AM  ?PFT Results  ?FVC-Pre L 1.69    ?FVC-Predicted Pre % 63    ?FVC-Post L 1.77    ?FVC-Predicted Post % 66    ?Pre FEV1/FVC % % 58    ?Post FEV1/FCV % % 57    ?FEV1-Pre L 0.99    ?FEV1-Predicted Pre % 49    ?FEV1-Post L 1.00    ?DLCO uncorrected ml/min/mmHg 16.62    ?DLCO UNC% % 92    ?DLVA Predicted % 115    ?TLC L 5.98    ?TLC % Predicted % 125    ?RV % Predicted % 185    ? ?I have personally reviewed the patient's PFTs and they show severe airflow limitation.  ? ?Labs: ?Lab Results  ?Component Value Date  ? WBC 7.7 02/02/2022  ? HGB 14.1 02/02/2022  ? HCT 43.0 02/02/2022  ? MCV 90.3 02/02/2022  ? PLT 267 02/02/2022   ? ? ? ?Immunization status: ?Immunization History  ?Administered Date(s) Administered  ? Fluad Quad(high Dose 65+) 06/17/2019, 06/30/2021  ? Influenza Split 06/18/2011, 07/10/2012, 07/30/2013, 08/17/2013, 07/15/2014, 06/18/2015, 07/05/2016, 07/21/2020  ? Influenza, High Dose Seasonal PF 06/09/2011, 07/10/2012, 07/12/2017, 07/11/2018, 07/21/2020  ? Influenza,inj,Quad PF,6+ Mos 07/07/2015, 07/11/2016  ? Influenza-Unspecified 07/16/2014, 07/16/2018  ? Moderna SARS-COV2 Booster Vaccination 03/09/2021  ? PFIZER(Purple Top)SARS-COV-2 Vaccination 11/21/2019, 12/17/2019, 08/04/2020  ? Pension scheme manager 52yr & up 07/20/2021  ? Pneumococcal Conjugate-13 12/18/2013  ? Pneumococcal Polysaccharide-23 07/10/2012  ? Tdap 07/10/2012  ? Zoster Recombinat (Shingrix) 12/16/2018, 04/16/2019  ? Zoster, Live 04/24/2008, 12/16/2018, 04/16/2019  ? ? ?Assessment:  ?COPD with severe airflow limitation FEV1 44% of predicted ?Chronic respiratory failure on 2LNC ?History of tobacco use disorder ?Breast Cancer - Preoperative evaluation for lumpectomy. ? ?Plan/Recommendations: ?continue trelegy ?continue albuterol nebulizer beforehand, and can take it again in the evenings.  ?continue home oxygen 2LNC ? ?She is out of the window for lung cancer screening - quit in 1998.  ? ? ?ASSESSMENT AND RECOMMENDATIONS:  ?Preoperative Risk Calculation: ?The features of this patient's history that contribute to the pulmonary risk assessment include: Age, COPD, General anesthesia, throacic surgery, surgery >2 hours. ? ?This patient has an intermediate risk of post-operative pulmonary complications by ARISCAT Index.  The absolute assessment of risk/benefit of the procedure is deferred to the primary team's evaluation. ? ?- Patient's Estimated risk of postoperative respiratory failure is 13.3% based on the ARISCAT Index.  ? ?0 to 25 points: Low risk: 10.3%pulmonary complication rate  ?26 to 44 points: Intermediate risk: 154.6%pulmonary  complication rate  ?45 to 123 points: High risk: 456.8%pulmonary complication rate ? ?Postoperative respiratory failure (PRF) is considered as failure to wean from mechanical ventilation within 48 hours of surgery or unplanned intubation/reintubation postoperatively. The validated risk calculator provides a risk estimate of PRF and is anticipated to aid in surgical decision-making and informed patient consent.  ?However risk can be accepted given the potential benefit of this intervention and it  is not prohibitive. ? ?RECOMMENDATIONS:  ?In order to minimize the risk of complications and optimize pulmonary status, we recommend the following: ?- Encourage aggressive incentive spirometry hourly both peri-operatively and post-operatively as tolerated  ?- Early ambulation and physical therapy as tolerated post-operatively ?- Adequate pain control especially in the setting of abdominal and thoracic surgery ?- Bronchodilators as needed for wheezing or shortness of breath ?- Oral steroids only if the patient appears to have component of COPD exacerbation, otherwise no routine utilization needed ?- Intraoperatively keep OR time to the shortest as possible ? ? ?ARISCAT: Mazo et al. Anesthesiology [phone] ? ? ?Return to Care: ?Return in about 4 months (around 06/09/2022). ? ? ?Lenice Llamas, MD ?Pulmonary and Critical Care Medicine ?[place] ?Office:[phone] ? ? ? ? ? ?

## 2022-02-09 ENCOUNTER — Encounter (HOSPITAL_COMMUNITY): Payer: Self-pay

## 2022-02-09 ENCOUNTER — Encounter: Payer: Self-pay | Admitting: Radiology

## 2022-02-09 ENCOUNTER — Encounter: Payer: Self-pay | Admitting: Genetic Counselor

## 2022-02-09 ENCOUNTER — Telehealth: Payer: Self-pay | Admitting: Genetic Counselor

## 2022-02-09 ENCOUNTER — Other Ambulatory Visit: Payer: Self-pay

## 2022-02-09 ENCOUNTER — Inpatient Hospital Stay: Payer: Medicare Other | Admitting: *Deleted

## 2022-02-09 ENCOUNTER — Inpatient Hospital Stay: Payer: Medicare Other

## 2022-02-09 ENCOUNTER — Encounter (HOSPITAL_COMMUNITY)
Admission: RE | Admit: 2022-02-09 | Discharge: 2022-02-09 | Disposition: A | Discharge status: Home / Self Care | Payer: Medicare Other | Source: Ambulatory Visit | Attending: Surgery | Admitting: Surgery

## 2022-02-09 ENCOUNTER — Encounter: Payer: Self-pay | Admitting: *Deleted

## 2022-02-09 VITALS — BP 132/71 | HR 78 | Temp 98.0°F | Resp 18 | Ht 62.0 in | Wt 153.6 lb

## 2022-02-09 DIAGNOSIS — Z1379 Encounter for other screening for genetic and chromosomal anomalies: Secondary | ICD-10-CM | POA: Insufficient documentation

## 2022-02-09 DIAGNOSIS — Z170 Estrogen receptor positive status [ER+]: Secondary | ICD-10-CM

## 2022-02-09 DIAGNOSIS — E119 Type 2 diabetes mellitus without complications: Secondary | ICD-10-CM | POA: Insufficient documentation

## 2022-02-09 DIAGNOSIS — J449 Chronic obstructive pulmonary disease, unspecified: Secondary | ICD-10-CM | POA: Diagnosis not present

## 2022-02-09 DIAGNOSIS — Z9981 Dependence on supplemental oxygen: Secondary | ICD-10-CM | POA: Insufficient documentation

## 2022-02-09 DIAGNOSIS — Z01812 Encounter for preprocedural laboratory examination: Secondary | ICD-10-CM | POA: Diagnosis not present

## 2022-02-09 DIAGNOSIS — I10 Essential (primary) hypertension: Secondary | ICD-10-CM | POA: Diagnosis not present

## 2022-02-09 DIAGNOSIS — J9610 Chronic respiratory failure, unspecified whether with hypoxia or hypercapnia: Secondary | ICD-10-CM | POA: Insufficient documentation

## 2022-02-09 DIAGNOSIS — E785 Hyperlipidemia, unspecified: Secondary | ICD-10-CM | POA: Insufficient documentation

## 2022-02-09 HISTORY — DX: Gastro-esophageal reflux disease without esophagitis: K21.9

## 2022-02-09 LAB — RESEARCH LABS

## 2022-02-09 LAB — GLUCOSE, CAPILLARY: Glucose-Capillary: 138 mg/dL — ABNORMAL HIGH (ref 70–99)

## 2022-02-09 LAB — HEMOGLOBIN A1C
Hgb A1c MFr Bld: 6.9 % — ABNORMAL HIGH (ref 4.8–5.6)
Mean Plasma Glucose: 151.33 mg/dL

## 2022-02-09 NOTE — Progress Notes (Signed)
PCP - [name] MD ?Cardiologist -  Dr. Terri Skains at [facility]. ?Pulmonologist: Dr. Lenice Llamas ? ?PPM/ICD - denies ?Device Orders -  ?Rep Notified -  ? ?Chest x-ray - 07/21/20 ?EKG - 07/01/21 ?Stress Test -  ?ECHO - 07/14/21 ?Cardiac Cath - 07/27/21 ? ?Sleep Study - none ?CPAP -  ? ?Fasting Blood Sugar - 120's ?Checks Blood Sugar once a day ? ?Blood Thinner Instructions:n/a ?Aspirin Instructions:Pt states she will call Dr. Trevor Mace office for aspirin instructions.  ? ?ERAS Protcol -clear liquids until 12pm ?PRE-SURGERY Ensure or G2- G2 ? ?COVID TEST- no  ? ? ?Anesthesia review: seed pt; home O2 ? ?Patient denies shortness of breath, fever, cough and chest pain at PAT appointment ? ? ?All instructions explained to the patient, with a verbal understanding of the material. Patient agrees to go over the instructions while at home for a better understanding. Patient also instructed to self quarantine after being tested for COVID-19. The opportunity to ask questions was provided. ?  ?

## 2022-02-09 NOTE — Telephone Encounter (Signed)
Revealed negative genetic testing.  Discussed that we do not know why she has  cancer or why there is cancer in the family. It could be sporadic/famillial, due to a different gene that we are not testing, or maybe our current technology may not be able to pick something up.  It will be important for her to keep in contact with genetics to keep up with whether additional testing may be needed.   ? ? ?

## 2022-02-09 NOTE — Research (Signed)
[employer] 2021-05 - Specimen Collection Study to Evaluate Biomarkers in Subjects with Cancer   ? ?02/09/2022 ? ?SECOND ELIGIBILITY: This Coordinator has reviewed this patient's inclusion and exclusion criteria and confirmed Yolanda Morris is eligible for study participation.  Patient will continue with enrollment. ? ?Menopausal status (women only): Yolanda Morris is post menopausal. ? ?Second eligibility confirmed by this clinical Research officer, political party, who also agrees that patient should proceed with enrollment.  ? ?[name], RT(R)(T) ?Clinical Research Coordinator  ?

## 2022-02-09 NOTE — Progress Notes (Addendum)
Surgical Instructions ? ? ? Your procedure is scheduled on Wednesday May 3. ? Report to [facility] Main Entrance "A" at 1:00 P.M., then check in with the Admitting office. ? Call this number if you have problems the morning of surgery: ? [phone] ? ? If you have any questions prior to your surgery date call [phone]: Open Monday-Friday 8am-4pm ? ? ? Remember: ? Do not eat after midnight the night before your surgery ? ?You may drink clear liquids until 12pm the morning of your surgery.   ?Clear liquids allowed are: Water, Non-Citrus Juices (without pulp), Carbonated Beverages, Clear Tea, Black  ?Coffee ONLY (NO MILK, CREAM OR POWDERED CREAMER of any kind), and Gatorade ?Patient Instructions ? ?The day of surgery (if you have diabetes): ?Drink ONE (1) 12 oz G2 given to you in your pre admission testing appointment by 12pm the morning of surgery. Drink in one sitting. Do not sip.  ?This drink was given to you during your hospital  ?pre-op appointment visit.  ?Nothing else to drink after completing the  ?12 oz bottle of G2. ? ?       If you have questions, please contact your surgeon?s office.  ? ?  ? Take these medicines the morning of surgery with A SIP OF WATER:  ?cetirizine (ZYRTEC)  ?fluticasone (FLONASE)  ?Fluticasone ?Umeclidin-Vilant (TRELEGY ELLIPTA) ?omeprazole (PRILOSEC) ? ?If needed you may take: ?acetaminophen (TYLENOL)  ?albuterol (VENTOLIN HFA) 108 (90 Base) MCG/ACT inhaler . Bring to the hospital with you. ?valACYclovir (VALTREX) ? ?As of today, STOP taking any Aspirin (unless otherwise instructed by your surgeon) Aleve, Naproxen, Ibuprofen, Motrin, Advil, Goody's, BC's, all herbal medications, fish oil, and all vitamins. ? ?WHAT DO I DO ABOUT MY DIABETES MEDICATION? ? ? ?Do not take oral diabetes medicines (pills) the morning of surgery.  DO NOT take metFORMIN (GLUCOPHAGE-XR) the morning of surgery.  ? ? ?HOW TO MANAGE YOUR DIABETES ?BEFORE AND AFTER SURGERY ? ?Why is it important to control my  blood sugar before and after surgery? ?Improving blood sugar levels before and after surgery helps healing and can limit problems. ?A way of improving blood sugar control is eating a healthy diet by: ? Eating less sugar and carbohydrates ? Increasing activity/exercise ? Talking with your doctor about reaching your blood sugar goals ?High blood sugars (greater than 180 mg/dL) can raise your risk of infections and slow your recovery, so you will need to focus on controlling your diabetes during the weeks before surgery. ?Make sure that the doctor who takes care of your diabetes knows about your planned surgery including the date and location. ? ?How do I manage my blood sugar before surgery? ?Check your blood sugar at least 4 times a day, starting 2 days before surgery, to make sure that the level is not too high or low. ? ?Check your blood sugar the morning of your surgery when you wake up and every 2 hours until you get to the Short Stay unit. ? ?If your blood sugar is less than 70 mg/dL, you will need to treat for low blood sugar: ?Do not take insulin. ?Treat a low blood sugar (less than 70 mg/dL) with ? cup of clear juice (cranberry or apple), 4 glucose tablets, OR glucose gel. ?Recheck blood sugar in 15 minutes after treatment (to make sure it is greater than 70 mg/dL). If your blood sugar is not greater than 70 mg/dL on recheck, call [phone] for further instructions. ?Report your blood sugar to the short stay  nurse when you get to Short Stay. ? ?If you are admitted to the hospital after surgery: ?Your blood sugar will be checked by the staff and you will probably be given insulin after surgery (instead of oral diabetes medicines) to make sure you have good blood sugar levels. ?The goal for blood sugar control after surgery is 80-180 mg/dL.  ?         ?Do not wear jewelry or makeup ?Do not wear lotions, powders, perfumes/colognes, or deodorant. ?Do not shave 48 hours prior to surgery.  Men may shave face and  neck. ?Do not bring valuables to the hospital. ?Do not wear nail polish, gel polish, artificial nails, or any other type of covering on natural nails (fingers and toes) ?If you have artificial nails or gel coating that need to be removed by a nail salon, please have this removed prior to surgery. Artificial nails or gel coating may interfere with anesthesia's ability to adequately monitor your vital signs. ? ?[facility] is not responsible for any belongings or valuables. .  ? ?Do NOT Smoke (Tobacco/Vaping)  24 hours prior to your procedure ? ?If you use a CPAP at night, you may bring your mask for your overnight stay. ?  ?Contacts, glasses, hearing aids, dentures or partials may not be worn into surgery, please bring cases for these belongings ?  ?For patients admitted to the hospital, discharge time will be determined by your treatment team. ?  ?Patients discharged the day of surgery will not be allowed to drive home, and someone needs to stay with them for 24 hours. ? ? ?SURGICAL WAITING ROOM VISITATION ?Patients having surgery or a procedure in a hospital may have two support people. ?Children under the age of 51 must have an adult with them who is not the patient. ?They may stay in the waiting area during the procedure and may switch out with other visitors. If the patient needs to stay at the hospital during part of their recovery, the visitor guidelines for inpatient rooms apply. ? ?Please refer to the [facility] website for the visitor guidelines for Inpatients (after your surgery is over and you are in a regular room).  ? ? ? ? ? ?Special instructions:   ? ?Oral Hygiene is also important to reduce your risk of infection.  Remember - BRUSH YOUR TEETH THE MORNING OF SURGERY WITH YOUR REGULAR TOOTHPASTE ? ? ?[facility]- Preparing For Surgery ? ?Before surgery, you can play an important role. Because skin is not sterile, your skin needs to be as free of germs as possible. You can reduce the number of germs on  your skin by washing with CHG (chlorahexidine gluconate) Soap before surgery.  CHG is an antiseptic cleaner which kills germs and bonds with the skin to continue killing germs even after washing.   ? ? ?Please do not use if you have an allergy to CHG or antibacterial soaps. If your skin becomes reddened/irritated stop using the CHG.  ?Do not shave (including legs and underarms) for at least 48 hours prior to first CHG shower. It is OK to shave your face. ? ?Please follow these instructions carefully. ?  ? ? Shower the NIGHT BEFORE SURGERY and the MORNING OF SURGERY with CHG Soap.  ? If you chose to wash your hair, wash your hair first as usual with your normal shampoo. After you shampoo, rinse your hair and body thoroughly to remove the shampoo.  Then [employer] and genitals (private parts) with your normal  soap and rinse thoroughly to remove soap. ? ?After that Use CHG Soap as you would any other liquid soap. You can apply CHG directly to the skin and wash gently with a scrungie or a clean washcloth.  ? ?Apply the CHG Soap to your body ONLY FROM THE NECK DOWN.  Do not use on open wounds or open sores. Avoid contact with your eyes, ears, mouth and genitals (private parts). Wash Face and genitals (private parts)  with your normal soap.  ? ?Wash thoroughly, paying special attention to the area where your surgery will be performed. ? ?Thoroughly rinse your body with warm water from the neck down. ? ?DO NOT shower/wash with your normal soap after using and rinsing off the CHG Soap. ? ?Pat yourself dry with a CLEAN TOWEL. ? ?Wear CLEAN PAJAMAS to bed the night before surgery ? ?Place CLEAN SHEETS on your bed the night before your surgery ? ?DO NOT SLEEP WITH PETS. ? ? ?Day of Surgery: ? ?Take a shower with CHG soap. ?Wear Clean/Comfortable clothing the morning of surgery ?Do not apply any deodorants/lotions.   ?Remember to brush your teeth WITH YOUR REGULAR TOOTHPASTE. ? ? ? ?If you received a COVID test during your  pre-op visit, it is requested that you wear a mask when out in public, stay away from anyone that may not be feeling well, and notify your surgeon if you develop symptoms. If you have been in contact wit

## 2022-02-09 NOTE — Research (Signed)
[employer] 2021-05 - Specimen Collection Study to Evaluate Biomarkers in Subjects with Cancer   ? ?Patient Yolanda Morris was identified by Dr. Chryl Heck as a potential candidate for the above listed study.  This Clinical Research Nurse met with Yolanda Morris, [name] on 02/09/22 in a manner and location that ensures patient privacy to discuss participation in the above listed research study.  Patient is Unaccompanied.  Patient was previously provided with informed consent documents.  Patient confirmed they have read the informed consent documents. ? ?As outlined in the informed consent form, this Nurse and Yolanda Morris discussed the purpose of the research study, the investigational nature of the study, study procedures and requirements for study participation, potential risks and benefits of study participation, as well as alternatives to participation.  This study is not blinded or double-blinded. The patient understands participation is voluntary and they may withdraw from study participation at any time.  This study does not involve randomization.  This study does not involve an investigational drug or device. This study does not involve a placebo. Patient understands enrollment is pending full eligibility review.  ? ?Confidentiality and how the patient's information will be used as part of study participation were discussed.  Patient was informed there is reimbursement provided for their time and effort spent on trial participation.  ? ?All questions were answered to patient's satisfaction.  The informed consent with embedded HIPAA language was reviewed page by page.  The patient's mental and emotional status is appropriate to provide informed consent, and the patient verbalizes an understanding of study participation.  Patient Morris agreed to participate in the above listed research study and Morris voluntarily signed the informed consent version [employer] IRB approved version 30 Oct 2020 (revised  15 Nov 2021) with embedded HIPAA language, version 30 Oct 2020  (revised on 15 Nov 2021) on 02/09/22 at 9:11AM.  The patient was provided with a copy of the signed informed consent form with embedded HIPAA language for their reference.  No study specific procedures were obtained prior to the signing of the informed consent document.  Approximately 20 minutes were spent with the patient reviewing the informed consent documents.  Patient was not requested to complete a Release of Information form.  ? ?Eligibility: Eligibility criteria reviewed with patient. This Nurse Morris reviewed this patient's inclusion and exclusion criteria and confirmed patient is eligible for study participation. Yolanda Morris, Research officer, political party, completed the 2nd review for this pt and confirmed the pt met all criteria for enrollment.  In addition, eligibility confirmed by treating investigator, Dr. Chryl Heck, who also agrees that patient should proceed with enrollment. ?Patient will continue with enrollment. ? ?The research nurse asked the pt the following questions about her current and past medical history.  The pt understands that this data is needed for study purposes.   ?Medical History:  ?High Blood Pressure  Yes ?Coronary Artery Disease No ?Lupus    No ?Rheumatoid Arthritis  No ?Diabetes   Yes      ?If yes, which type?      Type 2 ?Lynch Syndrome  No ? ?Is the patient currently taking a magnesium supplement?   No ?Does the patient have a personal history of cancer (greater than 5 years ago)?  Yes ?If yes, Cancer type and date of diagnosis?   Basal cell skin cancer, diagnosed in 2004 ?Morris this previous diagnosis been treated? Yes    ?If so, treatment type? Surgery excision   ?Start and end dates of last  treatment cycle? Pt reported her last basal cell skin cancer was removed in 2016 which required 3 stitches.   ? ?Does the patient have a family history of cancer in 1st or 2nd degree relatives? No, Pt denied any first degree relatives.   Medical record mentions the pt had 2 aunts (paternal side) with an ovarian cancer diagnosis and a stomach cancer diagnosis.  ? ?Does the patient have history of alcohol consumption? No   ?Does the patient have history of cigarette, cigar, pipe, or chewing tobacco use?  Yes  ?If yes, current for former? former ?If yes, type (Cigarette, cigar, pipe, and/or chewing tobacco)? Cigarette, only ?If former, year stopped? 1998 ?Number of years? 25 ?Packs/number/containers per day? 1.5 to 2 packs per day  ? ?Blood Collection: Research blood obtained by venipuncture per patient's preference. Patient tolerated well without any adverse events. ?Gift Card: $50 gift card given to patient for her participation in this study by Yolanda Morris, Naval architect.  ?  ?The pt was thanked for her participation in this data and specimen study.  The research nurse will request her tumor block to submit to the study and enter her study data.  ? ?Yolanda Aliment RN, BSN, CCRP ?Clinical Research Nurse Lead ?02/09/2022 10:15 AM   ? ?

## 2022-02-10 ENCOUNTER — Telehealth: Payer: Self-pay | Admitting: *Deleted

## 2022-02-10 NOTE — Telephone Encounter (Signed)
[employer] 2021-05 - Specimen Collection Study to Evaluate Biomarkers in Subjects with Cancer   ? ?The research nurse called the pt to get some additional information on her most recent basal cell carcinoma that was removed from her face in 2016.  The study required a date including the month and day of the diagnosis as well as the treatment dates.  The pt looked through her records, and she stated that her basal cell carcinoma was diagnosed on 01/01/2015.  She said that the doctor elected to remove the skin cancer from her face on that same date.  The pt said that the skin cancer removal surgery required 3 stitches.  The pt was thanked for providing those details regarding her most recent skin cancer diagnosis.  ? ?Of note, the pt's breast tumor tissue block was delivered to the cancer center this afternoon.  This block will be submitted to the study on 02/14/22.  The pt submitted her research blood samples on Wednesday, 02/09/22.  Therefore, this pt has completed her participation in the [employer] 2021-05 study.  ? ?Brion Aliment RN, BSN, CCRP ?Clinical Research Nurse Lead ?02/10/2022 3:49 PM   ?

## 2022-02-10 NOTE — Progress Notes (Signed)
Anesthesia Chart Review: ? ?Recent cardiology evaluation for atypical chest pain.  Seen by Dr. Terri Skains at [facility] cardiovascular. Patient underwent an echocardiogram which noted preserved LVEF, grade 1 diastolic impairment, no significant valvular heart disease and nuclear stress test which was low risk.  Continued risk factor reduction and management of DM2, HLD, and HTN recommended. ? ?Follows with pulmonology for history of COPD with chronic respiratory failure on supplemental oxygen 2 LNC.  Last seen by Dr. Shearon Stalls 02/07/2022.  Discussed upcoming surgery.  Per note, "Preoperative Risk Calculation: The features of this patient's history that contribute to the pulmonary risk assessment include: Age, COPD, General anesthesia, throacic surgery, surgery >2 hours. ?  ?This patient has an intermediate risk of post-operative pulmonary complications by ARISCAT Index.  The absolute assessment of risk/benefit of the procedure is deferred to the primary team's evaluation. - Patient's Estimated risk of postoperative respiratory failure is 13.3% based on the ARISCAT Index. 0 to 25 points: Low risk: 8.1% pulmonary complication rate. 26 to 44 points: Intermediate risk: 82.9% pulmonary complication rate. 45 to 123 points: High risk: 93.7% pulmonary complication rate. Postoperative respiratory failure (PRF) is considered as failure to wean from mechanical ventilation within 48 hours of surgery or unplanned intubation/reintubation postoperatively. The validated risk calculator provides a risk estimate of PRF and is anticipated to aid in surgical decision-making and informed patient consent.  However risk can be accepted given the potential benefit of this intervention and it is not prohibitive. ?  ?RECOMMENDATIONS:  In order to minimize the risk of complications and optimize pulmonary status, we recommend the following: - Encourage aggressive incentive spirometry hourly both peri-operatively and post-operatively as tolerated - Early  ambulation and physical therapy as tolerated post-operatively - Adequate pain control especially in the setting of abdominal and thoracic surgery - Bronchodilators as needed for wheezing or shortness of breath - Oral steroids only if the patient appears to have component of COPD exacerbation, otherwise no routine utilization needed - Intraoperatively keep OR time to the shortest as possible." ? ?Non-insulin-dependent DM2, A1c 6.9 on preop labs. ? ?Preop labs reviewed, unremarkable. ? ?EKG 07/01/2021: Sinus tachycardia 102 bpm, normal axis, nonspecific ST depressions,without underlying injury pattern.  ? ?Echocardiogram: ?07/27/2021: ?Left ventricle cavity is normal in size. Mild concentric hypertrophy of the left ventricle. Normal global wall motion.  ?Normal LV systolic function with EF 61%. Doppler evidence of grade I (impaired) diastolic dysfunction, normal LAP.  ?Mild (Grade I) mitral regurgitation. ?Estimated right atrial pressure 8 mmHg. ?  ?Stress Testing: ?Modified Bruce/Lexiscan Tetrofosmin stress test 07/14/2021: ?Lexiscan/modified Bruce nuclear stress test performed using 1-day protocol. Patient achieved 2.2 METS on modified Bruce protocol and reached 108% MPHR. ?Stress EKG at 108% MPHR showed sinus tachycardia, no ST-T wave abnormality. ?Normal myocardial perfusion. Stress LVEF 63%. ?Low risk study. ?  ?Carotid artery duplex 07/27/2021:  ?Duplex suggests stenosis in the right internal carotid artery (1-15%).  ?Duplex suggests stenosis in the right external carotid artery (<50%). ?Duplex suggests stenosis in the left internal carotid artery (16-49%).  ?Duplex suggests stenosis in the left external carotid artery.  ?There is homogenous plaque in the right carotid artery and heterogeneous plaque in the left carotid artery. ?Antegrade right vertebral artery flow. Antegrade left vertebral artery flow. ?Follow up in one year is appropriate if clinically indicated. ?

## 2022-02-11 ENCOUNTER — Telehealth: Payer: Self-pay | Admitting: Internal Medicine

## 2022-02-11 MED ORDER — TRELEGY ELLIPTA 100-62.5-25 MCG/ACT IN AEPB: 1.0000 | INHALATION_SPRAY | Freq: Every day | RESPIRATORY_TRACT | 3 refills | Status: DC

## 2022-02-11 NOTE — Telephone Encounter (Signed)
Called and spoke with patient. She was requesting a refill on her Trelegy 100. She would like to have this sent to [facility] [place]. RX has been sent. Nothing further needed at time of call.  ?

## 2022-02-13 DIAGNOSIS — E119 Type 2 diabetes mellitus without complications: Secondary | ICD-10-CM | POA: Diagnosis not present

## 2022-02-15 ENCOUNTER — Ambulatory Visit
Admission: RE | Admit: 2022-02-15 | Discharge: 2022-02-15 | Disposition: A | Discharge status: Home / Self Care | Payer: Medicare Other | Source: Ambulatory Visit | Attending: Surgery | Admitting: Surgery

## 2022-02-15 DIAGNOSIS — Z853 Personal history of malignant neoplasm of breast: Secondary | ICD-10-CM

## 2022-02-15 DIAGNOSIS — C50911 Malignant neoplasm of unspecified site of right female breast: Secondary | ICD-10-CM | POA: Diagnosis not present

## 2022-02-15 NOTE — H&P (Signed)
?REFERRING PHYSICIAN:  Mel Almond* ?  ?PROVIDER:  Beverlee Nims, MD ?  ?MRN: [id number] ?DOB: 1946/01/05 ?DATE OF ENCOUNTER: 02/02/2022 ?Subjective  ?Chief Complaint: Breast Cancer ?  ?  ?History of Present Illness: ?Yolanda Morris is a 76 y.o. female who is seen today as an office consultation for evaluation of Breast Cancer ? .   ?  ?76 year old female who was found to be mammography asymmetry in the right breast.  An ultrasound showed an 8 mm mass.  Ultrasound the axilla was negative.  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  There was 30% ER positive, PR negative, HER2 negative, and had a Ki-67 of 20%.  She has had no previous problems regarding her breast.  She denies nipple discharge.  She does have COPD and is on oxygen as needed.  She has no cardiac issues.  She denies nipple discharge. ?   ?Review of Systems: ?A complete review of systems was obtained from the patient.  I have reviewed this information and discussed as appropriate with the patient.  See HPI as well for other ROS. ?  ?ROS  ?  ?Medical History: ?Past Medical History  ?    ?Past Medical History:  ?Diagnosis Date  ? COPD (chronic obstructive pulmonary disease) (CMS-HCC)    ? Diabetes mellitus without complication (CMS-HCC)    ? GERD (gastroesophageal reflux disease)    ? Heart valve disease    ? History of cancer    ? Hyperlipidemia    ? Hypertension    ? Liver disease    ?  ?  ?  ?   ?Patient Active Problem List  ?Diagnosis  ? Type 2 diabetes mellitus without complications (CMS-HCC)  ? Malignant neoplasm of upper-outer quadrant of right breast in female, estrogen receptor positive (CMS-HCC)  ? Hyperlipidemia  ? Essential hypertension  ?  ?  ?Past Surgical History  ?     ?Past Surgical History:  ?Procedure Laterality Date  ? COLON SURGERY N/A    ?  Date Unknown  ?  ?  ?  ?Allergies  ?     ?Allergies  ?Allergen Reactions  ? Bacitracin Rash  ?    rash ?   ? Levofloxacin Hives   ?    hives ?   ?  ?  ?  ?      ?Current Outpatient Medications on File Prior to Visit  ?Medication Sig Dispense Refill  ? albuterol (PROVENTIL) 2.5 mg /3 mL (0.083 %) nebulizer solution Inhale 2.5 mg into the lungs every 6 (six) hours as needed      ? albuterol (VENTOLIN HFA) 90 mcg/actuation inhaler 1 puff as needed      ? losartan (COZAAR) 100 MG tablet Take 100 mg by mouth once daily      ? losartan (COZAAR) 50 MG tablet Take 1 tablet by mouth      ? metFORMIN (GLUCOPHAGE-XR) 500 MG XR tablet TAKE 2 TABLETS BY MOUTH EVERY DAY WITH THE EVENING MEAL      ? rosuvastatin (CRESTOR) 10 MG tablet 1 tablet      ? aspirin 81 MG EC tablet Take 81 mg by mouth once daily      ? calcium carbonate 600 mg calcium (1,500 mg) Tab tablet 1 tablet with meals      ? cetirizine (ZYRTEC) 10 MG tablet 1 tablet      ? cholecalciferol (VITAMIN D3) 1000 unit capsule  Take 1 capsule by mouth once daily      ? lysine HCL 500 mg Cap Take 1 capsule by mouth once daily      ? melatonin 5 mg Cap Take 1 capsule by mouth once daily as needed      ? omeprazole (PRILOSEC) 20 MG DR capsule 1 capsule      ?  ?No current facility-administered medications on file prior to visit.  ?  ?  ?Family History  ?     ?Family History  ?Problem Relation Age of Onset  ? High blood pressure (Hypertension) Mother    ? Myocardial Infarction (Heart attack) Sister    ?  ?  ?  ?Social History  ?  ?    ?Tobacco Use  ?Smoking Status Former  ? Types: Cigarettes  ? Quit date: 10/17/1996  ? Years since quitting: 25.3  ?Smokeless Tobacco Never  ?  ?  ?Social History  ?Social History  ?  ?     ?Socioeconomic History  ? Marital status: Unknown  ?Tobacco Use  ? Smoking status: Former  ?    Types: Cigarettes  ?    Quit date: 10/17/1996  ?    Years since quitting: 25.3  ? Smokeless tobacco: Never  ?Vaping Use  ? Vaping Use: Never used  ?Substance and Sexual Activity  ? Alcohol use: Not Currently  ? Drug use: Never  ?  ?  ?  ?Objective:  ?There were no vitals filed for this visit.   ?There is no height or weight on file to calculate BMI. ?  ?Physical Exam  ?  ?She appears well on exam and does not appear short of breath.  She is able to carry on a conversation. ?  ?There are no palpable breast masses.  The nipple areolar complexes are normal.  There is no axillary adenopathy ?  ?Labs, Imaging and Diagnostic Testing: ?I reviewed her mammograms, ultrasound, and pathology results ?  ?Assessment and Plan:  ?   ?Diagnoses and all orders for this visit: ?  ?Invasive ductal carcinoma of breast, female, right (CMS-HCC) ?  ?  ?  ?This is a patient with an invasive possible triple negative right breast cancer.  From a surgical standpoint I had a long discussion with her regarding surgical options.  We have also discussed her in ourbreast cancer conference. ?We discussed both breast conservation and mastectomy.  She is interested in breast conservation.  I next discussed proceeding with a radioactive seed right breast lumpectomy and sentinel biopsy.  I explained the procedure in detail.  We discussed the risk which includes but is not limited to bleeding, infection, injury to surrounding structures, the need for further surgery if lymph nodes or margins are positive, cardiopulmonary issues, postoperative recovery, etc.  She understands and wished to proceed with surgery which will be scheduled. ?  ?

## 2022-02-16 ENCOUNTER — Encounter (HOSPITAL_COMMUNITY)
Admission: RE | Disposition: A | Discharge status: Home / Self Care | Payer: Self-pay | Source: Home / Self Care | Attending: Surgery

## 2022-02-16 ENCOUNTER — Other Ambulatory Visit: Payer: Self-pay

## 2022-02-16 ENCOUNTER — Encounter (HOSPITAL_COMMUNITY): Payer: Self-pay | Admitting: Surgery

## 2022-02-16 ENCOUNTER — Ambulatory Visit (HOSPITAL_BASED_OUTPATIENT_CLINIC_OR_DEPARTMENT_OTHER): Payer: Medicare Other

## 2022-02-16 ENCOUNTER — Ambulatory Visit
Admission: RE | Admit: 2022-02-16 | Discharge: 2022-02-16 | Disposition: A | Discharge status: Home / Self Care | Payer: Medicare Other | Source: Ambulatory Visit | Attending: Surgery | Admitting: Surgery

## 2022-02-16 ENCOUNTER — Ambulatory Visit (HOSPITAL_COMMUNITY)
Admission: RE | Admit: 2022-02-16 | Discharge: 2022-02-16 | Disposition: A | Discharge status: Home / Self Care | Payer: Medicare Other | Attending: Surgery | Admitting: Surgery

## 2022-02-16 ENCOUNTER — Ambulatory Visit (HOSPITAL_COMMUNITY): Payer: Medicare Other | Admitting: Physician Assistant

## 2022-02-16 DIAGNOSIS — E119 Type 2 diabetes mellitus without complications: Secondary | ICD-10-CM

## 2022-02-16 DIAGNOSIS — Z7984 Long term (current) use of oral hypoglycemic drugs: Secondary | ICD-10-CM | POA: Diagnosis not present

## 2022-02-16 DIAGNOSIS — I10 Essential (primary) hypertension: Secondary | ICD-10-CM | POA: Diagnosis not present

## 2022-02-16 DIAGNOSIS — D0511 Intraductal carcinoma in situ of right breast: Secondary | ICD-10-CM | POA: Diagnosis not present

## 2022-02-16 DIAGNOSIS — Z853 Personal history of malignant neoplasm of breast: Secondary | ICD-10-CM

## 2022-02-16 DIAGNOSIS — C50911 Malignant neoplasm of unspecified site of right female breast: Secondary | ICD-10-CM

## 2022-02-16 DIAGNOSIS — G8918 Other acute postprocedural pain: Secondary | ICD-10-CM | POA: Diagnosis not present

## 2022-02-16 DIAGNOSIS — J449 Chronic obstructive pulmonary disease, unspecified: Secondary | ICD-10-CM | POA: Diagnosis not present

## 2022-02-16 DIAGNOSIS — Z87891 Personal history of nicotine dependence: Secondary | ICD-10-CM | POA: Insufficient documentation

## 2022-02-16 DIAGNOSIS — Z9981 Dependence on supplemental oxygen: Secondary | ICD-10-CM | POA: Insufficient documentation

## 2022-02-16 DIAGNOSIS — Z79899 Other long term (current) drug therapy: Secondary | ICD-10-CM | POA: Insufficient documentation

## 2022-02-16 DIAGNOSIS — Z170 Estrogen receptor positive status [ER+]: Secondary | ICD-10-CM

## 2022-02-16 HISTORY — PX: BREAST LUMPECTOMY WITH RADIOACTIVE SEED AND SENTINEL LYMPH NODE BIOPSY: SHX6550

## 2022-02-16 LAB — GLUCOSE, CAPILLARY
Glucose-Capillary: 115 mg/dL — ABNORMAL HIGH (ref 70–99)
Glucose-Capillary: 176 mg/dL — ABNORMAL HIGH (ref 70–99)

## 2022-02-16 SURGERY — BREAST LUMPECTOMY WITH RADIOACTIVE SEED AND SENTINEL LYMPH NODE BIOPSY
Anesthesia: Regional | Site: Breast | Laterality: Right

## 2022-02-16 MED ORDER — ROCURONIUM BROMIDE 10 MG/ML (PF) SYRINGE
PREFILLED_SYRINGE | INTRAVENOUS | Status: AC
  Filled 2022-02-16: qty 20

## 2022-02-16 MED ORDER — OXYCODONE HCL 5 MG/5ML PO SOLN: 5.0000 mg | Freq: Once | ORAL | Status: DC | PRN

## 2022-02-16 MED ORDER — PROPOFOL 10 MG/ML IV BOLUS
INTRAVENOUS | Status: DC | PRN
  Administered 2022-02-16: 30 mg via INTRAVENOUS
  Administered 2022-02-16: 100 mg via INTRAVENOUS

## 2022-02-16 MED ORDER — AMISULPRIDE (ANTIEMETIC) 5 MG/2ML IV SOLN: 10.0000 mg | Freq: Once | INTRAVENOUS | Status: DC | PRN

## 2022-02-16 MED ORDER — FENTANYL CITRATE (PF) 100 MCG/2ML IJ SOLN
INTRAMUSCULAR | Status: AC
  Administered 2022-02-16: 50 ug via INTRAVENOUS
  Filled 2022-02-16: qty 2

## 2022-02-16 MED ORDER — PHENYLEPHRINE 80 MCG/ML (10ML) SYRINGE FOR IV PUSH (FOR BLOOD PRESSURE SUPPORT)
PREFILLED_SYRINGE | INTRAVENOUS | Status: DC | PRN
  Administered 2022-02-16: 40 ug via INTRAVENOUS

## 2022-02-16 MED ORDER — LIDOCAINE 2% (20 MG/ML) 5 ML SYRINGE
INTRAMUSCULAR | Status: DC | PRN
  Administered 2022-02-16: 40 mg via INTRAVENOUS

## 2022-02-16 MED ORDER — DEXAMETHASONE SODIUM PHOSPHATE 10 MG/ML IJ SOLN
INTRAMUSCULAR | Status: AC
  Filled 2022-02-16: qty 1

## 2022-02-16 MED ORDER — CHLORHEXIDINE GLUCONATE CLOTH 2 % EX PADS: 6.0000 | MEDICATED_PAD | Freq: Once | CUTANEOUS | Status: DC

## 2022-02-16 MED ORDER — INSULIN ASPART 100 UNIT/ML IJ SOLN: 0.0000 [IU] | INTRAMUSCULAR | Status: DC | PRN

## 2022-02-16 MED ORDER — MAGTRACE LYMPHATIC TRACER
INTRAMUSCULAR | Status: DC | PRN
  Administered 2022-02-16: 2 mL via INTRAMUSCULAR

## 2022-02-16 MED ORDER — PROPOFOL 10 MG/ML IV BOLUS
INTRAVENOUS | Status: AC
  Filled 2022-02-16: qty 20

## 2022-02-16 MED ORDER — 0.9 % SODIUM CHLORIDE (POUR BTL) OPTIME
TOPICAL | Status: DC | PRN
  Administered 2022-02-16: 1000 mL

## 2022-02-16 MED ORDER — FENTANYL CITRATE (PF) 100 MCG/2ML IJ SOLN: 25.0000 ug | INTRAMUSCULAR | Status: DC | PRN

## 2022-02-16 MED ORDER — CHLORHEXIDINE GLUCONATE 0.12 % MT SOLN
15.0000 mL | Freq: Once | OROMUCOSAL | Status: AC
  Administered 2022-02-16: 15 mL via OROMUCOSAL
  Filled 2022-02-16: qty 15

## 2022-02-16 MED ORDER — FENTANYL CITRATE (PF) 250 MCG/5ML IJ SOLN
INTRAMUSCULAR | Status: DC | PRN
  Administered 2022-02-16 (×2): 25 ug via INTRAVENOUS

## 2022-02-16 MED ORDER — PHENYLEPHRINE 80 MCG/ML (10ML) SYRINGE FOR IV PUSH (FOR BLOOD PRESSURE SUPPORT)
PREFILLED_SYRINGE | INTRAVENOUS | Status: AC
  Filled 2022-02-16: qty 10

## 2022-02-16 MED ORDER — ACETAMINOPHEN 500 MG PO TABS: 1000.0000 mg | ORAL_TABLET | ORAL | Status: DC

## 2022-02-16 MED ORDER — LIDOCAINE 2% (20 MG/ML) 5 ML SYRINGE
INTRAMUSCULAR | Status: AC
  Filled 2022-02-16: qty 15

## 2022-02-16 MED ORDER — ONDANSETRON HCL 4 MG/2ML IJ SOLN
INTRAMUSCULAR | Status: AC
  Filled 2022-02-16: qty 2

## 2022-02-16 MED ORDER — BUPIVACAINE LIPOSOME 1.3 % IJ SUSP
INTRAMUSCULAR | Status: DC | PRN
  Administered 2022-02-16: 10 mL via PERINEURAL

## 2022-02-16 MED ORDER — CEFAZOLIN SODIUM-DEXTROSE 2-4 GM/100ML-% IV SOLN
2.0000 g | INTRAVENOUS | Status: AC
  Administered 2022-02-16: 2 g via INTRAVENOUS
  Filled 2022-02-16: qty 100

## 2022-02-16 MED ORDER — ONDANSETRON HCL 4 MG/2ML IJ SOLN
INTRAMUSCULAR | Status: DC | PRN
  Administered 2022-02-16: 4 mg via INTRAVENOUS

## 2022-02-16 MED ORDER — MIDAZOLAM HCL 2 MG/2ML IJ SOLN
INTRAMUSCULAR | Status: AC
  Filled 2022-02-16: qty 2

## 2022-02-16 MED ORDER — ORAL CARE MOUTH RINSE: 15.0000 mL | Freq: Once | OROMUCOSAL | Status: AC

## 2022-02-16 MED ORDER — ENSURE PRE-SURGERY PO LIQD: 296.0000 mL | Freq: Once | ORAL | Status: DC

## 2022-02-16 MED ORDER — BUPIVACAINE-EPINEPHRINE 0.25% -1:200000 IJ SOLN
INTRAMUSCULAR | Status: DC | PRN
  Administered 2022-02-16: 10 mL

## 2022-02-16 MED ORDER — FENTANYL CITRATE (PF) 100 MCG/2ML IJ SOLN: 50.0000 ug | Freq: Once | INTRAMUSCULAR | Status: AC

## 2022-02-16 MED ORDER — FENTANYL CITRATE (PF) 250 MCG/5ML IJ SOLN
INTRAMUSCULAR | Status: AC
  Filled 2022-02-16: qty 5

## 2022-02-16 MED ORDER — LACTATED RINGERS IV SOLN
INTRAVENOUS | Status: DC
Start: 2022-02-16 — End: 2022-02-16

## 2022-02-16 MED ORDER — OXYCODONE HCL 5 MG PO TABS: 5.0000 mg | ORAL_TABLET | Freq: Once | ORAL | Status: DC | PRN

## 2022-02-16 MED ORDER — ONDANSETRON HCL 4 MG/2ML IJ SOLN: 4.0000 mg | Freq: Once | INTRAMUSCULAR | Status: DC | PRN

## 2022-02-16 MED ORDER — ACETAMINOPHEN 500 MG PO TABS
1000.0000 mg | ORAL_TABLET | Freq: Once | ORAL | Status: DC
  Filled 2022-02-16: qty 2

## 2022-02-16 MED ORDER — BUPIVACAINE-EPINEPHRINE (PF) 0.25% -1:200000 IJ SOLN
INTRAMUSCULAR | Status: AC
  Filled 2022-02-16: qty 30

## 2022-02-16 MED ORDER — TRAMADOL HCL 50 MG PO TABS: 50.0000 mg | ORAL_TABLET | Freq: Four times a day (QID) | ORAL | 0 refills | Status: AC | PRN

## 2022-02-16 MED ORDER — ROPIVACAINE HCL 5 MG/ML IJ SOLN
INTRAMUSCULAR | Status: DC | PRN
  Administered 2022-02-16: 20 mL via PERINEURAL

## 2022-02-16 MED ORDER — DEXAMETHASONE SODIUM PHOSPHATE 10 MG/ML IJ SOLN
INTRAMUSCULAR | Status: DC | PRN
  Administered 2022-02-16: 10 mg via INTRAVENOUS

## 2022-02-16 SURGICAL SUPPLY — 43 items
ADH SKN CLS APL DERMABOND .7 (GAUZE/BANDAGES/DRESSINGS) ×1
APL PRP STRL LF DISP 70% ISPRP (MISCELLANEOUS) ×1
APPLIER CLIP 9.375 MED OPEN (MISCELLANEOUS) ×2
APR CLP MED 9.3 20 MLT OPN (MISCELLANEOUS) ×1
BAG COUNTER SPONGE SURGICOUNT (BAG) ×2 IMPLANT
BAG SPNG CNTER NS LX DISP (BAG) ×1
BINDER BREAST LRG (GAUZE/BANDAGES/DRESSINGS) IMPLANT
BINDER BREAST XLRG (GAUZE/BANDAGES/DRESSINGS) IMPLANT
CANISTER SUCT 3000ML PPV (MISCELLANEOUS) ×2 IMPLANT
CHLORAPREP W/TINT 26 (MISCELLANEOUS) ×2 IMPLANT
CLIP APPLIE 9.375 MED OPEN (MISCELLANEOUS) ×1 IMPLANT
COVER PROBE W GEL 5X96 (DRAPES) ×2 IMPLANT
COVER SURGICAL LIGHT HANDLE (MISCELLANEOUS) ×2 IMPLANT
DERMABOND ADVANCED (GAUZE/BANDAGES/DRESSINGS) ×1
DERMABOND ADVANCED .7 DNX12 (GAUZE/BANDAGES/DRESSINGS) ×1 IMPLANT
DEVICE DUBIN SPECIMEN MAMMOGRA (MISCELLANEOUS) ×2 IMPLANT
DRAPE CHEST BREAST 15X10 FENES (DRAPES) ×2 IMPLANT
ELECT CAUTERY BLADE 6.4 (BLADE) ×2 IMPLANT
ELECT REM PT RETURN 9FT ADLT (ELECTROSURGICAL) ×2
ELECTRODE REM PT RTRN 9FT ADLT (ELECTROSURGICAL) ×1 IMPLANT
GLOVE SURG SIGNA 7.5 PF LTX (GLOVE) ×2 IMPLANT
GOWN STRL REUS W/ TWL LRG LVL3 (GOWN DISPOSABLE) ×1 IMPLANT
GOWN STRL REUS W/ TWL XL LVL3 (GOWN DISPOSABLE) ×1 IMPLANT
GOWN STRL REUS W/TWL LRG LVL3 (GOWN DISPOSABLE) ×2
GOWN STRL REUS W/TWL XL LVL3 (GOWN DISPOSABLE) ×2
KIT BASIN OR (CUSTOM PROCEDURE TRAY) ×2 IMPLANT
KIT MARKER MARGIN INK (KITS) ×2 IMPLANT
NDL 18GX1X1/2 (RX/OR ONLY) (NEEDLE) IMPLANT
NDL FILTER BLUNT 18X1 1/2 (NEEDLE) IMPLANT
NDL HYPO 25GX1X1/2 BEV (NEEDLE) ×1 IMPLANT
NEEDLE 18GX1X1/2 (RX/OR ONLY) (NEEDLE) IMPLANT
NEEDLE FILTER BLUNT 18X 1/2SAF (NEEDLE)
NEEDLE FILTER BLUNT 18X1 1/2 (NEEDLE) IMPLANT
NEEDLE HYPO 25GX1X1/2 BEV (NEEDLE) ×2 IMPLANT
NS IRRIG 1000ML POUR BTL (IV SOLUTION) ×2 IMPLANT
PACK GENERAL/GYN (CUSTOM PROCEDURE TRAY) ×2 IMPLANT
PAD ABD 8X10 STRL (GAUZE/BANDAGES/DRESSINGS) ×1 IMPLANT
SUT MNCRL AB 4-0 PS2 18 (SUTURE) ×2 IMPLANT
SUT VIC AB 3-0 SH 18 (SUTURE) ×2 IMPLANT
SYR CONTROL 10ML LL (SYRINGE) ×2 IMPLANT
TOWEL GREEN STERILE (TOWEL DISPOSABLE) ×2 IMPLANT
TOWEL GREEN STERILE FF (TOWEL DISPOSABLE) ×2 IMPLANT
TRACER MAGTRACE VIAL (MISCELLANEOUS) ×1 IMPLANT

## 2022-02-16 NOTE — Anesthesia Procedure Notes (Signed)
Anesthesia Regional Block: Pectoralis block  ? ?Pre-Anesthetic Checklist: , timeout performed,  Correct Patient, Correct Site, Correct Laterality,  Correct Procedure, Correct Position, site marked,  Risks and benefits discussed,  Surgical consent,  Pre-op evaluation,  At surgeon's request and post-op pain management ? ?Laterality: Right ? ?Prep: Maximum Sterile Barrier Precautions used, chloraprep     ?  ?Needles:  ?Injection technique: Single-shot ? ?Needle Type: Echogenic Stimulator Needle   ? ? ?Needle Length: 9cm  ?Needle Gauge: 22  ? ? ? ?Additional Needles: ? ? ?Procedures:,,,, ultrasound used (permanent image in chart),,    ?Narrative:  ?Start time: 02/16/2022 2:30 PM ?End time: 02/16/2022 2:35 PM ?Injection made incrementally with aspirations every 5 mL. ? ?Performed by: Personally  ?Anesthesiologist: Pervis Hocking, DO ? ?Additional Notes: ?Monitors applied. No increased pain on injection. No increased resistance to injection. Injection made in 5cc increments. Good needle visualization. Patient tolerated procedure well.  ? ? ? ? ?

## 2022-02-16 NOTE — Interval H&P Note (Signed)
History and Physical Interval Note: no change in H and P ? ?02/16/2022 ?2:36 PM ? ?Yolanda Morris  has presented today for surgery, with the diagnosis of RIGHT BREAST CANCER.  The various methods of treatment have been discussed with the patient and family. After consideration of risks, benefits and other options for treatment, the patient has consented to  Procedure(s): ?RIGHT BREAST LUMPECTOMY WITH RADIOACTIVE SEED AND SENTINEL LYMPH NODE BIOPSY (Right) as a surgical intervention.  The patient's history has been reviewed, patient examined, no change in status, stable for surgery.  I have reviewed the patient's chart and labs.  Questions were answered to the patient's satisfaction.   ? ? ?[name] ? ? ?

## 2022-02-16 NOTE — Op Note (Signed)
RIGHT BREAST LUMPECTOMY WITH RADIOACTIVE SEED AND SENTINEL LYMPH NODE BIOPSY  Procedure Note ? ?Niel Hummer ?02/16/2022 ? ? ?Pre-op Diagnosis: RIGHT BREAST CANCER ?    ?Post-op Diagnosis: same ? ?Procedure(s): ?RADIOACTIVE SEED GUIDED RIGHT BREAST LUMPECTOMY ?RIGHT AXILLARY DEEP SENTINEL LYMPH NODE BIOPSY ?INJECTION OF MAGTRACE FOR LYMPH NODE MAPPING ? ?Surgeon(s): ?[name], MD ?Lucita Ferrara, PA-C ? ?Anesthesia: General ? ?Staff:  ?Circulator: Sammie Bench, RN; Rometta Emery, RN; Teague, [place] A, RN ?Scrub Person: Coolbaugh, Lajuana Matte, Jesus C, RN ?Circulator Assistant: Hinda Glatter, RN ? ?Estimated Blood Loss: Minimal ?              ?Specimens: sent to path ? ?Indications: This is a 76 year old female was recently found to have a small mass on screening mammography of the right breast.  She underwent a biopsy of the mass showing an invasive ductal carcinoma.  The decision was made to proceed with a radioactive seed guided right breast lumpectomy and sentinel lymph node biopsy ? ?Procedure: The patient underwent a pectoralis nerve block on the right side by anesthesiology in the preoperative holding area.  She was then taken to the operating room.  She was placed upon the operating table general anesthesia was induced.  Her right breast and axilla were then prepped and draped in usual sterile fashion.  Using the neoprobe I localized the seed in the upper outer quadrant of the breast.  I elected to make an incision in the axilla after anesthetized skin with Marcaine.  I then tunneled medially toward the radioactive seed.  With the aid of the neoprobe I was able to get more medial than the seed and then dissected down to the chest wall.  I could palpate the mass and stayed widely around with aid of the neoprobe as well.  I then took the dissection more laterally and completed the lumpectomy.  Margins were marked with paint.  An x-ray was performed from the radioactive seed and  previous biopsy clip in the specimen.  I elected to take further medial margin and sent to pathology separately.  This was done with the cautery.  An external attention through the same incision and toward the axilla.  I dissected down to the deep axillary tissue.  With the aid of the mag trace probe I was able to identify what appeared to be several sentinel lymph nodes which were sent together and fatty tissue and then 1 larger palpable lymph node.  No other adenopathy was identified.  I did control one small bridging vessel with surgical clips.  At this point hemostasis appeared to be achieved to both sites.  I placed clips around the periphery of the biopsy cavity.  The patient subcutaneous tissue was then closed interrupted 3-0 Vicryl sutures and the skin was closed with running 4-0 Monocryl.  Dermabond was then applied.  The patient was next placed in a breast binder.  She was then extubated in the operating room and taken in stable condition to the recovery room. ?        ? ?[name]  ? ?Date: 02/16/2022  Time: 3:34 PM ? ? ? ?

## 2022-02-16 NOTE — Transfer of Care (Signed)
Immediate Anesthesia Transfer of Care Note ? ?Patient: Yolanda Morris ? ?Procedure(s) Performed: RIGHT BREAST LUMPECTOMY WITH RADIOACTIVE SEED AND SENTINEL LYMPH NODE BIOPSY (Right: Breast) ? ?Patient Location: PACU ? ?Anesthesia Type:General ? ?Level of Consciousness: awake, alert  and oriented ? ?Airway & Oxygen Therapy: Patient Spontanous Breathing and Patient connected to face mask oxygen ? ?Post-op Assessment: Report given to RN, Post -op Vital signs reviewed and stable and Patient moving all extremities X 4 ? ?Post vital signs: Reviewed and stable ? ?Last Vitals:  ?Vitals Value Taken Time  ?BP 153/79 02/16/22 1550  ?Temp    ?Pulse 90 02/16/22 1551  ?Resp 13 02/16/22 1551  ?SpO2 97 % 02/16/22 1551  ?Vitals shown include unvalidated device data. ? ?Last Pain:  ?Vitals:  ? 02/16/22 1340  ?TempSrc:   ?PainSc: 0-No pain  ?   ? ?  ? ?Complications: No notable events documented. ?

## 2022-02-16 NOTE — Anesthesia Procedure Notes (Addendum)
Procedure Name: LMA Insertion ?Date/Time: 02/16/2022 2:49 PM ?Performed by: Justin Mend, RN ?Pre-anesthesia Checklist: Patient identified, Emergency Drugs available, Suction available and Patient being monitored ?Patient Re-evaluated:Patient Re-evaluated prior to induction ?Oxygen Delivery Method: Circle System Utilized ?Preoxygenation: Pre-oxygenation with 100% oxygen ?Induction Type: IV induction ?Ventilation: Mask ventilation without difficulty ?LMA: LMA inserted ?LMA Size: 4.0 ?Number of attempts: 1 ?Airway Equipment and Method: Bite block ?Placement Confirmation: positive ETCO2 and breath sounds checked- equal and bilateral ?Tube secured with: Tape ?Dental Injury: Teeth and Oropharynx as per pre-operative assessment  ?Comments: Performed by Justin Mend, SRNA under direct supervision of MDA and CRNA ? ? ? ? ?

## 2022-02-16 NOTE — Anesthesia Preprocedure Evaluation (Addendum)
Anesthesia Evaluation  ?Patient identified by MRN, date of birth, ID band ?Patient awake ? ? ? ?Reviewed: ?Allergy & Precautions, NPO status , Patient's Chart, lab work & pertinent test results ? ?Airway ?Mallampati: II ? ?TM Distance: >3 FB ?Neck ROM: Full ? ? ? Dental ? ?(+) Edentulous Upper, Missing,  ?  ?Pulmonary ?shortness of breath and with exertion, COPD,  COPD inhaler and oxygen dependent, former smoker,  ?Quit smoking 1998, 50 pack year history  ?Has been on 2LPM as needed for about 2-3 years now  ?  ?Pulmonary exam normal ?breath sounds clear to auscultation ? ? ? ? ? ? Cardiovascular ?hypertension, Pt. on medications ?Normal cardiovascular exam ?Rhythm:Regular Rate:Normal ? ? ?  ?Neuro/Psych ? Headaches, negative psych ROS  ? GI/Hepatic ?Neg liver ROS, GERD  Controlled,  ?Endo/Other  ?diabetes, Well Controlled, Type 2, Oral Hypoglycemic Agentsfs 115, a1c 6.9 ? Renal/GU ?negative Renal ROS  ?negative genitourinary ?  ?Musculoskeletal ?negative musculoskeletal ROS ?(+)  ? Abdominal ?  ?Peds ?negative pediatric ROS ?(+)  Hematology ?negative hematology ROS ?(+)   ?Anesthesia Other Findings ? ? Reproductive/Obstetrics ?negative OB ROS ? ?  ? ? ? ? ? ? ? ? ? ? ? ? ? ?  ?  ? ? ? ? ? ? ?Anesthesia Physical ?Anesthesia Plan ? ?ASA: 4 ? ?Anesthesia Plan: General and Regional  ? ?Post-op Pain Management: Regional block*  ? ?Induction: Intravenous ? ?PONV Risk Score and Plan: 3 and Ondansetron, Dexamethasone, Midazolam and Treatment may vary due to age or medical condition ? ?Airway Management Planned: LMA ? ?Additional Equipment: None ? ?Intra-op Plan:  ? ?Post-operative Plan: Extubation in OR ? ?Informed Consent: I have reviewed the patients History and Physical, chart, labs and discussed the procedure including the risks, benefits and alternatives for the proposed anesthesia with the patient or authorized representative who has indicated his/her understanding and acceptance.   ? ? ? ?Dental advisory given ? ?Plan Discussed with: CRNA ? ?Anesthesia Plan Comments:   ? ? ? ? ? ? ?Anesthesia Quick Evaluation ? ?

## 2022-02-16 NOTE — Discharge Instructions (Signed)
[facility] ?Office Phone Number [phone] ? ?BREAST BIOPSY/ PARTIAL MASTECTOMY: POST OP INSTRUCTIONS ? ?Always review your discharge instruction sheet given to you by the facility where your surgery was performed. ? ?IF YOU HAVE DISABILITY OR FAMILY LEAVE FORMS, YOU MUST BRING THEM TO THE OFFICE FOR PROCESSING.  DO NOT GIVE THEM TO YOUR DOCTOR. ? ?A prescription for pain medication may be given to you upon discharge.  Take your pain medication as prescribed, if needed.  If narcotic pain medicine is not needed, then you may take acetaminophen (Tylenol) or ibuprofen (Advil) as needed. ?Take your usually prescribed medications unless otherwise directed ?If you need a refill on your pain medication, please contact your pharmacy.  They will contact our office to request authorization.  Prescriptions will not be filled after 5pm or on week-ends. ?You should eat very light the first 24 hours after surgery, such as soup, crackers, pudding, etc.  Resume your normal diet the day after surgery. ?Most patients will experience some swelling and bruising in the breast.  Ice packs and a good support bra will help.  Swelling and bruising can take several days to resolve.  ?It is common to experience some constipation if taking pain medication after surgery.  Increasing fluid intake and taking a stool softener will usually help or prevent this problem from occurring.  A mild laxative (Milk of Magnesia or Miralax) should be taken according to package directions if there are no bowel movements after 48 hours. ?Unless discharge instructions indicate otherwise, you may remove your bandages 24-48 hours after surgery, and you may shower at that time.  You may have steri-strips (small skin tapes) in place directly over the incision.  These strips should be left on the skin for 7-10 days.  If your surgeon used skin glue on the incision, you may shower in 24 hours.  The glue will flake off over the next 2-3 weeks.  Any  sutures or staples will be removed at the office during your follow-up visit. ?ACTIVITIES:  You may resume regular daily activities (gradually increasing) beginning the next day.  Wearing a good support bra or sports bra minimizes pain and swelling.  You may have sexual intercourse when it is comfortable. ?You may drive when you no longer are taking prescription pain medication, you can comfortably wear a seatbelt, and you can safely maneuver your car and apply brakes. ?RETURN TO WORK:  ______________________________________________________________________________________ ?You should see your doctor in the office for a follow-up appointment approximately two weeks after your surgery.  Your doctor?s nurse will typically make your follow-up appointment when she calls you with your pathology report.  Expect your pathology report 2-3 business days after your surgery.  You may call to check if you do not hear from [place] after three days. ?OTHER INSTRUCTIONS: OK TO REMOVE THE BINDER AND SHOWER STARTING TOMORROW ?ICE PACK, TYLENOL ALSO FOR PAIN ?NO VIGOROUS ACTIVITY FOR ONE WEEK ?_______________________________________________________________________________________________ _____________________________________________________________________________________________________________________________________ ?_____________________________________________________________________________________________________________________________________ ?_____________________________________________________________________________________________________________________________________ ? ?WHEN TO CALL YOUR DOCTOR: ?Fever over 101.0 ?Nausea and/or vomiting. ?Extreme swelling or bruising. ?Continued bleeding from incision. ?Increased pain, redness, or drainage from the incision. ? ?The clinic staff is available to answer your questions during regular business hours.  Please don?t hesitate to call and ask to speak to one of the nurses for  clinical concerns.  If you have a medical emergency, go to the nearest emergency room or call 911.  A surgeon from [facility] is always on call at the hospital. ? ?For further questions,  please visit [email or web address]   ?

## 2022-02-17 ENCOUNTER — Encounter (HOSPITAL_COMMUNITY): Payer: Self-pay | Admitting: Surgery

## 2022-02-17 ENCOUNTER — Encounter (HOSPITAL_COMMUNITY): Payer: Self-pay

## 2022-02-17 NOTE — Anesthesia Postprocedure Evaluation (Signed)
Anesthesia Post Note ? ?Patient: Yolanda Morris ? ?Procedure(s) Performed: RIGHT BREAST LUMPECTOMY WITH RADIOACTIVE SEED AND SENTINEL LYMPH NODE BIOPSY (Right: Breast) ? ?  ? ?Patient location during evaluation: PACU ?Anesthesia Type: General ?Level of consciousness: awake and alert ?Pain management: pain level controlled ?Vital Signs Assessment: post-procedure vital signs reviewed and stable ?Respiratory status: spontaneous breathing, nonlabored ventilation, respiratory function stable and patient connected to nasal cannula oxygen ?Cardiovascular status: blood pressure returned to baseline and stable ?Postop Assessment: no apparent nausea or vomiting ?Anesthetic complications: no ? ? ?No notable events documented. ? ?Last Vitals:  ?Vitals:  ? 02/16/22 1605 02/16/22 1620  ?BP: 132/61 (!) 158/65  ?Pulse: 86 86  ?Resp: 19 19  ?Temp:  36.8 ?C  ?SpO2: 95% 93%  ?  ?Last Pain:  ?Vitals:  ? 02/16/22 1550  ?TempSrc:   ?PainSc: 4   ? ? ?  ?  ?  ?  ?  ?  ? ?Suzette Battiest E ? ? ? ? ?

## 2022-02-22 ENCOUNTER — Ambulatory Visit: Payer: Self-pay | Admitting: Genetic Counselor

## 2022-02-22 ENCOUNTER — Telehealth: Payer: Self-pay | Admitting: Genetic Counselor

## 2022-02-22 NOTE — Telephone Encounter (Signed)
Called patient to disclose pan-cancer panel results. Patient request return call at later time.  ?

## 2022-02-22 NOTE — Progress Notes (Signed)
HPI:   ?Ms. Yolanda Morris was previously seen in the [facility] due to a personal and family history of cancer and concerns regarding a hereditary predisposition to cancer. Please refer to our prior cancer genetics clinic note for more information regarding our discussion, assessment and recommendations, at the time. Ms. Yolanda Morris recent genetic test results were disclosed to her, as were recommendations warranted by these results. These results and recommendations are discussed in more detail below. ? ?CANCER HISTORY:  ?Oncology History  ?Malignant neoplasm of upper-outer quadrant of right breast in female, estrogen receptor positive ([place])  ?12/27/2021 Mammogram  ? Screening mammogram showed a right breast asymmetry warranting further investigation. ?Diagnostic mammogram showed 6 x 7 x 8 mm irregular hypoechoic right breast mass at 10 o'clock position 5 cm from the nipple.  No right axillary adenopathy ?  ?01/24/2022 Pathology Results  ? Pathology results showed right breast invasive ductal carcinoma, grade 2.  Prognostic showed ER 30%, positive weak staining intensity.  PR 0%, negative.  Ki-67 of 20%.  Tumor cells are negative for HER2 1+  ?  ?01/28/2022 Initial Diagnosis  ? Malignant neoplasm of upper-outer quadrant of right breast in female, estrogen receptor positive ([place]) ?  ?02/08/2022 Genetic Testing  ? Negative hereditary cancer genetic testing: no pathogenic variants detected in [facility] BRCAPlus Panel and [facility] CustomNext-Cancer +RNAinsight.  Report dates are February 08, 2022 and February 13, 2022.  ? ?The BRCAplus panel offered by [employer] and includes sequencing and deletion/duplication analysis for the following 8 genes: ATM, BRCA1, BRCA2, CDH1, CHEK2, PALB2, PTEN, and TP53.  The CustomNext-Cancer+RNAinsight panel offered by [employer] includes sequencing and rearrangement analysis for the following 47 genes:  APC, ATM, AXIN2, BARD1, BMPR1A, BRCA1, BRCA2, BRIP1, CDH1, CDK4, CDKN2A,  CHEK2, DICER1, EPCAM, GREM1, HOXB13, MEN1, MLH1, MSH2, MSH3, MSH6, MUTYH, NBN, NF1, NF2, NTHL1, PALB2, PMS2, POLD1, POLE, PTEN, RAD51C, RAD51D, RECQL, RET, SDHA, SDHAF2, SDHB, SDHC, SDHD, SMAD4, SMARCA4, STK11, TP53, TSC1, TSC2, and VHL.  RNA data is routinely analyzed for use in variant interpretation for all genes. ?  ? ? ?FAMILY HISTORY:  ?We obtained a detailed, 4-generation family history.  Significant diagnoses are listed below: ?Family History  ?Problem Relation Age of Onset  ? Cancer Maternal Aunt   ?     unknown type; d. 13  ? Cancer Maternal Uncle   ?     unknown type; d. 26s  ? Ovarian cancer Paternal Aunt   ?     d. 36  ? Stomach cancer Paternal Aunt 45  ? ? ?  ?Ms. Yolanda Morris is unaware of previous family history of genetic testing for hereditary cancer risks. There is no reported Ashkenazi Jewish ancestry. There is no known consanguinity. ?  ? ?GENETIC TEST RESULTS:  ?The [facility] CustomNext-Cancer +RNAinsight Panel found no pathogenic mutations. The CustomNext-Cancer+RNAinsight panel offered by [employer] includes sequencing and rearrangement analysis for the following 47 genes:  APC, ATM, AXIN2, BARD1, BMPR1A, BRCA1, BRCA2, BRIP1, CDH1, CDK4, CDKN2A, CHEK2, DICER1, EPCAM, GREM1, HOXB13, MEN1, MLH1, MSH2, MSH3, MSH6, MUTYH, NBN, NF1, NF2, NTHL1, PALB2, PMS2, POLD1, POLE, PTEN, RAD51C, RAD51D, RECQL, RET, SDHA, SDHAF2, SDHB, SDHC, SDHD, SMAD4, SMARCA4, STK11, TP53, TSC1, TSC2, and VHL.  RNA data is routinely analyzed for use in variant interpretation for all genes. ? ?The test report has been scanned into EPIC and is located under the Molecular Pathology section of the Results Review tab.  A portion of the result report is included below for reference. Genetic testing  reported out on February 13, 2022.  ? ? ? ? ?Even though a pathogenic variant was not identified, possible explanations for the cancer in the family may include: ?There may be no hereditary risk for cancer in the family. The cancers in Ms.  Yolanda Morris and/or her family may be sporadic/familial or due to other genetic and environmental factors. ?There may be a gene mutation in one of these genes that current testing methods cannot detect but that chance is small. ?There could be another gene that has not yet been discovered, or that we have not yet tested, that is responsible for the cancer diagnoses in the family.  ?It is also possible there is a hereditary cause for the cancer in the family that Ms. Yolanda Morris did not inherit. ? ?Therefore, it is important to remain in touch with cancer genetics in the future so that we can continue to offer Ms. Yolanda Morris the most up to date genetic testing.  ? ?ADDITIONAL GENETIC TESTING:  ?We discussed with Ms. Yolanda Morris that her genetic testing was fairly extensive.  If there are additional relevant genes identified to increase cancer risk that can be analyzed in the future, we would be happy to discuss and coordinate this testing at that time.   ? ? ?CANCER SCREENING RECOMMENDATIONS:  ?Ms. Yolanda Morris's test result is considered negative (normal).  This means that we have not identified a hereditary cause for her personal history of breast cancer at this time.  ? ?An individual's cancer risk and medical management are not determined by genetic test results alone. Overall cancer risk assessment incorporates additional factors, including personal medical history, family history, and any available genetic information that may result in a personalized plan for cancer prevention and surveillance. Therefore, it is recommended she continue to follow the cancer management and screening guidelines provided by her oncology and primary healthcare provider. ? ?RECOMMENDATIONS FOR FAMILY MEMBERS:   ?Individuals in this family might be at some increased risk of developing cancer, over the general population risk, due to the family history of cancer.  Individuals in the family should notify their providers of the family history of cancer. We  recommend women in this family have a yearly mammogram beginning at age 52, or 71 years younger than the earliest onset of cancer, an annual clinical breast exam, and perform monthly breast self-exams.   ? ? ?FOLLOW-UP:  ?Lastly, we discussed with Ms. Yolanda Morris that cancer genetics is a rapidly advancing field and it is possible that new genetic tests will be appropriate for her and/or her family members in the future. We encouraged her to remain in contact with cancer genetics on an annual basis so we can update her personal and family histories and let her know of advances in cancer genetics that may benefit this family.  ? ?Our contact number was provided. Ms. Yolanda Morris questions were answered to her satisfaction, and she knows she is welcome to call us at anytime with additional questions or concerns.  ? ?Denzal Meir M. Yolanda Morris, [place], [place] ?Genetic Counselor ?[email or web address] ?[phone]) [phone] ? ?

## 2022-02-22 NOTE — Telephone Encounter (Signed)
Revealed negative pan-cancer panel.    

## 2022-02-23 ENCOUNTER — Encounter: Payer: Self-pay | Admitting: *Deleted

## 2022-02-24 LAB — SURGICAL PATHOLOGY

## 2022-02-25 ENCOUNTER — Telehealth: Payer: Self-pay | Admitting: Internal Medicine

## 2022-02-25 NOTE — Telephone Encounter (Signed)
Called patient this morning to confirm she needed nebulizer supplies. And patient states that she still has a lot of her old supplies for her nebulizer machine and that she is fine for now. I told patient to inform us when she starts to run low on her supplies and we will get a new order to [facility] placed for her. Nothing further needed at this time  ?

## 2022-02-28 ENCOUNTER — Encounter: Payer: Self-pay | Admitting: *Deleted

## 2022-03-01 ENCOUNTER — Other Ambulatory Visit: Payer: Self-pay

## 2022-03-01 ENCOUNTER — Encounter: Payer: Self-pay | Admitting: Hematology and Oncology

## 2022-03-01 ENCOUNTER — Inpatient Hospital Stay: Payer: Medicare Other | Attending: Hematology and Oncology | Admitting: Hematology and Oncology

## 2022-03-01 DIAGNOSIS — Z170 Estrogen receptor positive status [ER+]: Secondary | ICD-10-CM | POA: Insufficient documentation

## 2022-03-01 DIAGNOSIS — C50411 Malignant neoplasm of upper-outer quadrant of right female breast: Secondary | ICD-10-CM | POA: Insufficient documentation

## 2022-03-01 DIAGNOSIS — Z79899 Other long term (current) drug therapy: Secondary | ICD-10-CM | POA: Insufficient documentation

## 2022-03-01 DIAGNOSIS — Z87891 Personal history of nicotine dependence: Secondary | ICD-10-CM | POA: Diagnosis not present

## 2022-03-01 NOTE — Progress Notes (Signed)
[facility] NOTE  Patient Care Team: [name], MD as PCP - General (Family Medicine) Rockwell Germany, RN as Oncology Nurse Navigator Mauro Kaufmann, RN as Oncology Nurse Navigator [name], MD as Consulting Physician (General Surgery) [name], MD as Consulting Physician (Hematology and Oncology) Eppie Gibson, MD as Attending Physician (Radiation Oncology)  CHIEF COMPLAINTS/PURPOSE OF CONSULTATION:  Newly diagnosed breast cancer  HISTORY OF PRESENTING ILLNESS:  Yolanda Morris 76 y.o. female is here because of recent diagnosis of right breast IDC  I reviewed her records extensively and collaborated the history with the patient.  SUMMARY OF ONCOLOGIC HISTORY: Oncology History  Malignant neoplasm of upper-outer quadrant of right breast in female, estrogen receptor positive ([place])  12/27/2021 Mammogram   Screening mammogram showed a right breast asymmetry warranting further investigation. Diagnostic mammogram showed 6 x 7 x 8 mm irregular hypoechoic right breast mass at 10 o'clock position 5 cm from the nipple.  No right axillary adenopathy   01/24/2022 Pathology Results   Pathology results showed right breast invasive ductal carcinoma, grade 2.  Prognostic showed ER 30%, positive weak staining intensity.  PR 0%, negative.  Ki-67 of 20%.  Tumor cells are negative for HER2 1+    01/28/2022 Initial Diagnosis   Malignant neoplasm of upper-outer quadrant of right breast in female, estrogen receptor positive ([place])   02/02/2022 Cancer Staging   Staging form: Breast, AJCC 8th Edition - Pathologic: Stage IB (pT1b, pN0, cM0, G2, ER-, PR-, HER2-) - Signed by [name], MD on 03/01/2022 Histologic grading system: 3 grade system    02/08/2022 Genetic Testing   Negative hereditary cancer genetic testing: no pathogenic variants detected in [facility] BRCAPlus Panel and [facility] CustomNext-Cancer +RNAinsight.  Report dates are February 08, 2022 and February 13, 2022.   The BRCAplus panel offered by [employer] and includes sequencing and deletion/duplication analysis for the following 8 genes: ATM, BRCA1, BRCA2, CDH1, CHEK2, PALB2, PTEN, and TP53.  The CustomNext-Cancer+RNAinsight panel offered by [employer] includes sequencing and rearrangement analysis for the following 47 genes:  APC, ATM, AXIN2, BARD1, BMPR1A, BRCA1, BRCA2, BRIP1, CDH1, CDK4, CDKN2A, CHEK2, DICER1, EPCAM, GREM1, HOXB13, MEN1, MLH1, MSH2, MSH3, MSH6, MUTYH, NBN, NF1, NF2, NTHL1, PALB2, PMS2, POLD1, POLE, PTEN, RAD51C, RAD51D, RECQL, RET, SDHA, SDHAF2, SDHB, SDHC, SDHD, SMAD4, SMARCA4, STK11, TP53, TSC1, TSC2, and VHL.  RNA data is routinely analyzed for use in variant interpretation for all genes.   02/16/2022 Surgery   Patient had right breast lumpectomy, deep sentinel lymph node biopsy on 02/16/2022.  Pathology showed invasive ductal carcinoma, 0.8 cm in maximal dimension, and 0.4 cm of the closest resection margin, DCIS coming to less than 0.1 cm of the closest resection margin.  No metastatic carcinoma in 4 out of 4 lymph nodes.  Prior prognostic showed ER 30% positive weak staining, PR 0% negative.  Repeat prognostics from final pathology showed ER negative, PR negative, KI of 15% and HER2 negative    Patient arrived to the appointment today with her sister.   She has been doing well since surgery, has felt some soreness in the right breast but otherwise feeling well. Rest of the pertinent 10 point ROS reviewed and negative  MEDICAL HISTORY:  Past Medical History:  Diagnosis Date   Cancer ([place])    skin   COPD (chronic obstructive pulmonary disease) ([place])    DM (diabetes mellitus) ([place])    Dyspnea    increased activity    Family history of ovarian  cancer 02/03/2022   Family history of stomach cancer 02/03/2022   Genital herpes    GERD (gastroesophageal reflux disease)    Hyperlipidemia    Migraines    Osteoporosis    Unspecified essential hypertension     SURGICAL  HISTORY: Past Surgical History:  Procedure Laterality Date   BREAST BIOPSY     BREAST EXCISIONAL BIOPSY     over 20 years ago- benign   BREAST LUMPECTOMY WITH RADIOACTIVE SEED AND SENTINEL LYMPH NODE BIOPSY Right 02/16/2022   Procedure: RIGHT BREAST LUMPECTOMY WITH RADIOACTIVE SEED AND SENTINEL LYMPH NODE BIOPSY;  Surgeon: [name], MD;  Location: [facility];  Service: General;  Laterality: Right;   BUNIONECTOMY Left 2004   and hammer toe repair Left foot   COLON SURGERY     removal of polyps   DENTAL SURGERY     RETINAL TEAR REPAIR CRYOTHERAPY      SOCIAL HISTORY: Social History   Socioeconomic History   Marital status: Widowed    Spouse name: Not on file   Number of children: Not on file   Years of education: Not on file   Highest education level: Not on file  Occupational History   Occupation: Development worker, community  Tobacco Use   Smoking status: Former    Packs/day: 2.00    Years: 25.00    Pack years: 50.00    Types: Cigarettes    Quit date: 10/17/1996    Years since quitting: 25.3   Smokeless tobacco: Never  Vaping Use   Vaping Use: Never used  Substance and Sexual Activity   Alcohol use: No    Alcohol/week: 0.0 standard drinks   Drug use: No   Sexual activity: Not on file  Other Topics Concern   Not on file  Social History Narrative   No children   Social Determinants of Health   Financial Resource Strain: Not on file  Food Insecurity: Not on file  Transportation Needs: Not on file  Physical Activity: Not on file  Stress: Not on file  Social Connections: Not on file  Intimate Partner Violence: Not on file    FAMILY HISTORY: Family History  Problem Relation Age of Onset   COPD Mother        deceased   Heart disease Mother    Alcohol abuse Father    Alcohol abuse Sister        deceased   Heart attack Sister    Alcohol abuse Brother        deceased   Cancer Maternal Aunt        unknown type; d. 53   Cancer Maternal Uncle        unknown  type; d. 63s   Ovarian cancer Paternal Aunt        d. 26   Stomach cancer Paternal Aunt 43   Breast cancer Neg Hx     ALLERGIES:  is allergic to telithromycin, bacitracin, and levofloxacin.  MEDICATIONS:  Current Outpatient Medications  Medication Sig Dispense Refill   acetaminophen (TYLENOL) 325 MG tablet Take 650 mg by mouth every 6 (six) hours as needed for moderate pain.     albuterol (PROVENTIL) (2.5 MG/3ML) 0.083% nebulizer solution Take 3 mLs (2.5 mg total) by nebulization every 6 (six) hours as needed for wheezing or shortness of breath. 360 mL 12   albuterol (VENTOLIN HFA) 108 (90 Base) MCG/ACT inhaler Inhale 2 puffs into the lungs every 6 (six) hours as needed for wheezing or shortness of breath. 3  each 3   aspirin EC 81 MG EC tablet Take 81 mg by mouth daily.     cetirizine (ZYRTEC) 10 MG tablet Take 10 mg by mouth daily.     Cholecalciferol (VITAMIN D3) 1000 UNITS CAPS Take 1,000 Units by mouth daily.     Cranberry 500 MG TABS Take 500 mg by mouth daily.     denosumab (PROLIA) 60 MG/ML SOSY injection Inject 60 mg into the skin every 6 (six) months.     fluticasone (FLONASE) 50 MCG/ACT nasal spray Place 1 spray into both nostrils daily.     Fluticasone-Umeclidin-Vilant (TRELEGY ELLIPTA) 100-62.5-25 MCG/ACT AEPB Inhale 1 puff into the lungs daily. 60 each 3   Fluticasone-Umeclidin-Vilant (TRELEGY ELLIPTA) 100-62.5-25 MCG/INH AEPB Inhale 1 puff into the lungs daily. 90 each 3   ibuprofen (ADVIL) 200 MG tablet Take 200 mg by mouth every 8 (eight) hours as needed for moderate pain.     IGLUCOSE TEST STRIPS test strip See admin instructions.     L-Lysine 500 MG CAPS Take 500 mg by mouth daily.     Lancets (STERILANCE TL) MISC See admin instructions.     losartan (COZAAR) 50 MG tablet TAKE 1 TABLET(50 MG) BY MOUTH TWICE DAILY 180 tablet 0   Melatonin 5 MG CAPS Take 5 mg by mouth daily as needed (sleep).     metFORMIN (GLUCOPHAGE-XR) 500 MG 24 hr tablet Take 1,000 mg by mouth daily  after supper.     Multiple Vitamins-Minerals (CENTRUM SILVER PO) Take 1 tablet by mouth daily.       omeprazole (PRILOSEC) 20 MG capsule Take 20 mg by mouth every other day.      OXYGEN Inhale 2 L into the lungs daily as needed (SOB).     polyethylene glycol (MIRALAX / GLYCOLAX) 17 g packet Take 17 g by mouth every other day.     rosuvastatin (CRESTOR) 10 MG tablet Take 1 tablet (10 mg total) by mouth at bedtime. 90 tablet 0   traMADol (ULTRAM) 50 MG tablet Take 1 tablet (50 mg total) by mouth every 6 (six) hours as needed. 20 tablet 0   valACYclovir (VALTREX) 500 MG tablet Take 500 mg by mouth 2 (two) times daily as needed (outbreaks). For 3 days     No current facility-administered medications for this visit.    REVIEW OF SYSTEMS:   Constitutional: Denies fevers, chills or abnormal night sweats Eyes: Denies blurriness of vision, double vision or watery eyes Ears, nose, mouth, throat, and face: Denies mucositis or sore throat Respiratory: Denies cough, dyspnea or wheezes Cardiovascular: Denies palpitation, chest discomfort or lower extremity swelling Gastrointestinal:  Denies nausea, heartburn or change in bowel habits Skin: Denies abnormal skin rashes Lymphatics: Denies new lymphadenopathy or easy bruising Neurological:Denies numbness, tingling or new weaknesses Behavioral/Psych: Mood is stable, no new changes  Breast: Denies any palpable lumps or discharge All other systems were reviewed with the patient and are negative.  PHYSICAL EXAMINATION: ECOG PERFORMANCE STATUS: 1 - Symptomatic but completely ambulatory  Vitals:   03/01/22 1546  BP: (!) 159/60  Pulse: (!) 105  Resp: 18  Temp: 97.9 F (36.6 C)  SpO2: 95%    Filed Weights   03/01/22 1546  Weight: 153 lb 6.4 oz (69.6 kg)     GENERAL:alert, no distress and comfortable Rest of the physical exam deferred in lieu of counseling   LABORATORY DATA:  I have reviewed the data as listed Lab Results  Component Value  Date   WBC  7.7 02/02/2022   HGB 14.1 02/02/2022   HCT 43.0 02/02/2022   MCV 90.3 02/02/2022   PLT 267 02/02/2022   Lab Results  Component Value Date   NA 140 02/02/2022   K 3.9 02/02/2022   CL 101 02/02/2022   CO2 33 (H) 02/02/2022    RADIOGRAPHIC STUDIES: I have personally reviewed the radiological reports and agreed with the findings in the report.  ASSESSMENT AND PLAN:  Malignant neoplasm of upper-outer quadrant of right breast in female, estrogen receptor positive ([place]) This is a very pleasant 76 year old postmenopausal female patient with newly diagnosed right breast upper outer quadrant invasive ductal carcinoma, ER 30% weak, PR negative and HER2 negative KI of 20% presented in the breast [place] for recommendations.  Given the very small size of the tumor, we have recommended considering surgery upfront followed by consideration for adjuvant chemotherapy based on the final pathology.  She is now status post surgery, had right breast lumpectomy, final pathology showed 8 mm invasive ductal carcinoma, grade 2, triple negative, KI of 15% Given final pathology showing tumor of 8 mm, we have discussed about considering adjuvant chemotherapy.  We have discussed about docetaxel and cyclophosphamide every 21 days for 4 cycles.  She understands that the role of chemotherapy is to reduce the risk of distant metastasis as well as local recurrence.  We have discussed about side effects of chemotherapy including but not limited to fatigue, nausea, vomiting, diarrhea, cytopenias, hepatotoxicity, neuropathy, increased risk of infections.  She understands that some of the side effects of chemotherapy can be life-threatening and permanent.  She would like to think about this.  We will set up a telephone visit in a week.  If she decides to omit chemotherapy, she will proceed with radiation and come back for follow-up.  All her questions were answered to the best of my knowledge.      [name],  MD 03/01/22

## 2022-03-01 NOTE — Assessment & Plan Note (Signed)
This is a very pleasant 76 year old postmenopausal female patient with newly diagnosed right breast upper outer quadrant invasive ductal carcinoma, ER 30% weak, PR negative and HER2 negative KI of 20% presented in the breast [place] for recommendations.  Given the very small size of the tumor, we have recommended considering surgery upfront followed by consideration for adjuvant chemotherapy based on the final pathology.  ?She is now status post surgery, had right breast lumpectomy, final pathology showed 8 mm invasive ductal carcinoma, grade 2, triple negative, KI of 15% ?Given final pathology showing tumor of 8 mm, we have discussed about considering adjuvant chemotherapy.  We have discussed about docetaxel and cyclophosphamide every 21 days for 4 cycles.  She understands that the role of chemotherapy is to reduce the risk of distant metastasis as well as local recurrence. ? ?We have discussed about side effects of chemotherapy including but not limited to fatigue, nausea, vomiting, diarrhea, cytopenias, hepatotoxicity, neuropathy, increased risk of infections.  She understands that some of the side effects of chemotherapy can be life-threatening and permanent.  She would like to think about this.  We will set up a telephone visit in a week.  If she decides to omit chemotherapy, she will proceed with radiation and come back for follow-up. ? ?All her questions were answered to the best of my knowledge. ?

## 2022-03-02 ENCOUNTER — Telehealth: Payer: Self-pay | Admitting: Hematology and Oncology

## 2022-03-02 ENCOUNTER — Encounter: Payer: Self-pay | Admitting: *Deleted

## 2022-03-02 NOTE — Telephone Encounter (Signed)
Scheduled appointment per 05/16 los. Patient aware.  ?

## 2022-03-03 ENCOUNTER — Encounter: Payer: Self-pay | Admitting: Internal Medicine

## 2022-03-03 ENCOUNTER — Encounter: Payer: Self-pay | Admitting: *Deleted

## 2022-03-04 ENCOUNTER — Encounter: Payer: Self-pay | Admitting: *Deleted

## 2022-03-04 NOTE — Telephone Encounter (Signed)
Dr Loanne Drilling replied to patient about the recommendations for the covid boosters. Patient verbalized understanding. Nothing further needed

## 2022-03-09 ENCOUNTER — Encounter: Payer: Self-pay | Admitting: Hematology and Oncology

## 2022-03-09 ENCOUNTER — Inpatient Hospital Stay (HOSPITAL_BASED_OUTPATIENT_CLINIC_OR_DEPARTMENT_OTHER): Payer: Medicare Other | Admitting: Hematology and Oncology

## 2022-03-09 DIAGNOSIS — Z170 Estrogen receptor positive status [ER+]: Secondary | ICD-10-CM | POA: Diagnosis not present

## 2022-03-09 DIAGNOSIS — C50411 Malignant neoplasm of upper-outer quadrant of right female breast: Secondary | ICD-10-CM

## 2022-03-09 NOTE — Assessment & Plan Note (Signed)
This is a very pleasant 76 year old postmenopausal female patient with newly diagnosed right breast upper outer quadrant invasive ductal carcinoma, ER 30% weak, PR negative and HER2 negative KI of 20% presented in the breast [place] for recommendations.  Given the very small size of the tumor, we have recommended considering surgery upfront followed by consideration for adjuvant chemotherapy based on the final pathology.  She is now status post surgery, had right breast lumpectomy, final pathology showed 8 mm invasive ductal carcinoma, grade 2, triple negative, KI of 15% Given final pathology showing tumor of 8 mm, we have discussed about considering adjuvant chemotherapy.  We have discussed about docetaxel and cyclophosphamide every 21 days for 4 cycles.  She understands that the role of chemotherapy is to reduce the risk of distant metastasis as well as local recurrence. She wanted to think about chemotherapy and keep [place] posted.  She is here for telephone visit today.  She just told me that she has given it a good thought and she would not like to proceed with adjuvant chemotherapy at this time.  She understands the risks and benefits.  She would like to move forward with adjuvant radiation.  With regards to the swelling about the surgical incision that she reported, this could most likely be postoperative seroma but she has a follow-up with surgery team this Friday for further evaluation. I do not see a contraindication to proceed with COVID booster or tetanus toxoid booster at this time.  She will return to clinic for follow-up with medical oncology in about 3 to 4 months.

## 2022-03-09 NOTE — Progress Notes (Signed)
[place] NOTE  Patient Care Team: [name], MD as PCP - General (Family Medicine) Rockwell Germany, RN as Oncology Nurse Navigator Mauro Kaufmann, RN as Oncology Nurse Navigator [name], MD as Consulting Physician (General Surgery) [name], MD as Consulting Physician (Hematology and Oncology) Eppie Gibson, MD as Attending Physician (Radiation Oncology)  CHIEF COMPLAINTS/PURPOSE OF CONSULTATION:  Newly diagnosed breast cancer  HISTORY OF PRESENTING ILLNESS:  Yolanda Morris 76 y.o. female is here because of recent diagnosis of right breast IDC  I reviewed her records extensively and collaborated the history with the patient.  SUMMARY OF ONCOLOGIC HISTORY: Oncology History  Malignant neoplasm of upper-outer quadrant of right breast in female, estrogen receptor positive ([place])  12/27/2021 Mammogram   Screening mammogram showed a right breast asymmetry warranting further investigation. Diagnostic mammogram showed 6 x 7 x 8 mm irregular hypoechoic right breast mass at 10 o'clock position 5 cm from the nipple.  No right axillary adenopathy   01/24/2022 Pathology Results   Pathology results showed right breast invasive ductal carcinoma, grade 2.  Prognostic showed ER 30%, positive weak staining intensity.  PR 0%, negative.  Ki-67 of 20%.  Tumor cells are negative for HER2 1+    01/28/2022 Initial Diagnosis   Malignant neoplasm of upper-outer quadrant of right breast in female, estrogen receptor positive ([place])   02/02/2022 Cancer Staging   Staging form: Breast, AJCC 8th Edition - Pathologic: Stage IB (pT1b, pN0, cM0, G2, ER-, PR-, HER2-) - Signed by [name], MD on 03/01/2022 Histologic grading system: 3 grade system    02/08/2022 Genetic Testing   Negative hereditary cancer genetic testing: no pathogenic variants detected in [facility] BRCAPlus Panel and [facility] CustomNext-Cancer +RNAinsight.  Report dates are February 08, 2022 and February 13, 2022.   The BRCAplus panel offered by [employer] and includes sequencing and deletion/duplication analysis for the following 8 genes: ATM, BRCA1, BRCA2, CDH1, CHEK2, PALB2, PTEN, and TP53.  The CustomNext-Cancer+RNAinsight panel offered by [facility] includes sequencing and rearrangement analysis for the following 47 genes:  APC, ATM, AXIN2, BARD1, BMPR1A, BRCA1, BRCA2, BRIP1, CDH1, CDK4, CDKN2A, CHEK2, DICER1, EPCAM, GREM1, HOXB13, MEN1, MLH1, MSH2, MSH3, MSH6, MUTYH, NBN, NF1, NF2, NTHL1, PALB2, PMS2, POLD1, POLE, PTEN, RAD51C, RAD51D, RECQL, RET, SDHA, SDHAF2, SDHB, SDHC, SDHD, SMAD4, SMARCA4, STK11, TP53, TSC1, TSC2, and VHL.  RNA data is routinely analyzed for use in variant interpretation for all genes.   02/16/2022 Surgery   Patient had right breast lumpectomy, deep sentinel lymph node biopsy on 02/16/2022.  Pathology showed invasive ductal carcinoma, 0.8 cm in maximal dimension, and 0.4 cm of the closest resection margin, DCIS coming to less than 0.1 cm of the closest resection margin.  No metastatic carcinoma in 4 out of 4 lymph nodes.  Prior prognostic showed ER 30% positive weak staining, PR 0% negative.  Repeat prognostics from final pathology showed ER negative, PR negative, KI of 15% and HER2 negative    Interval history  She is here for a telephone visit. She says she feels a swelling above the surgical incision, she has an appointment with surgeon this coming Friday. She said she has thought about chemotherapy and she would not like to proceed with it at this time.  She would like to go forward with adjuvant radiation and return to clinic with medical oncology for follow-up.  She is also wondering if she can take COVID booster as well as tetanus toxoid booster.  Rest of the pertinent  10 point ROS reviewed and negative  MEDICAL HISTORY:  Past Medical History:  Diagnosis Date   Cancer ([place])    skin   COPD (chronic obstructive pulmonary disease) ([place])    DM (diabetes mellitus)  ([place])    Dyspnea    increased activity    Family history of ovarian cancer 02/03/2022   Family history of stomach cancer 02/03/2022   Genital herpes    GERD (gastroesophageal reflux disease)    Hyperlipidemia    Migraines    Osteoporosis    Unspecified essential hypertension     SURGICAL HISTORY: Past Surgical History:  Procedure Laterality Date   BREAST BIOPSY     BREAST EXCISIONAL BIOPSY     over 20 years ago- benign   BREAST LUMPECTOMY WITH RADIOACTIVE SEED AND SENTINEL LYMPH NODE BIOPSY Right 02/16/2022   Procedure: RIGHT BREAST LUMPECTOMY WITH RADIOACTIVE SEED AND SENTINEL LYMPH NODE BIOPSY;  Surgeon: [name], MD;  Location: [facility];  Service: General;  Laterality: Right;   BUNIONECTOMY Left 2004   and hammer toe repair Left foot   COLON SURGERY     removal of polyps   DENTAL SURGERY     RETINAL TEAR REPAIR CRYOTHERAPY      SOCIAL HISTORY: Social History   Socioeconomic History   Marital status: Widowed    Spouse name: Not on file   Number of children: Not on file   Years of education: Not on file   Highest education level: Not on file  Occupational History   Occupation: Development worker, community  Tobacco Use   Smoking status: Former    Packs/day: 2.00    Years: 25.00    Pack years: 50.00    Types: Cigarettes    Quit date: 10/17/1996    Years since quitting: 25.4   Smokeless tobacco: Never  Vaping Use   Vaping Use: Never used  Substance and Sexual Activity   Alcohol use: No    Alcohol/week: 0.0 standard drinks   Drug use: No   Sexual activity: Not on file  Other Topics Concern   Not on file  Social History Narrative   No children   Social Determinants of Health   Financial Resource Strain: Not on file  Food Insecurity: Not on file  Transportation Needs: Not on file  Physical Activity: Not on file  Stress: Not on file  Social Connections: Not on file  Intimate Partner Violence: Not on file    FAMILY HISTORY: Family History  Problem  Relation Age of Onset   COPD Mother        deceased   Heart disease Mother    Alcohol abuse Father    Alcohol abuse Sister        deceased   Heart attack Sister    Alcohol abuse Brother        deceased   Cancer Maternal Aunt        unknown type; d. 63   Cancer Maternal Uncle        unknown type; d. 50s   Ovarian cancer Paternal Aunt        d. 40   Stomach cancer Paternal Aunt 43   Breast cancer Neg Hx     ALLERGIES:  is allergic to telithromycin, bacitracin, and levofloxacin.  MEDICATIONS:  Current Outpatient Medications  Medication Sig Dispense Refill   acetaminophen (TYLENOL) 325 MG tablet Take 650 mg by mouth every 6 (six) hours as needed for moderate pain.     albuterol (PROVENTIL) (2.5 MG/3ML)  0.083% nebulizer solution Take 3 mLs (2.5 mg total) by nebulization every 6 (six) hours as needed for wheezing or shortness of breath. 360 mL 12   albuterol (VENTOLIN HFA) 108 (90 Base) MCG/ACT inhaler Inhale 2 puffs into the lungs every 6 (six) hours as needed for wheezing or shortness of breath. 3 each 3   aspirin EC 81 MG EC tablet Take 81 mg by mouth daily.     cetirizine (ZYRTEC) 10 MG tablet Take 10 mg by mouth daily.     Cholecalciferol (VITAMIN D3) 1000 UNITS CAPS Take 1,000 Units by mouth daily.     Cranberry 500 MG TABS Take 500 mg by mouth daily.     denosumab (PROLIA) 60 MG/ML SOSY injection Inject 60 mg into the skin every 6 (six) months.     fluticasone (FLONASE) 50 MCG/ACT nasal spray Place 1 spray into both nostrils daily.     Fluticasone-Umeclidin-Vilant (TRELEGY ELLIPTA) 100-62.5-25 MCG/ACT AEPB Inhale 1 puff into the lungs daily. 60 each 3   ibuprofen (ADVIL) 200 MG tablet Take 200 mg by mouth every 8 (eight) hours as needed for moderate pain.     IGLUCOSE TEST STRIPS test strip See admin instructions.     L-Lysine 500 MG CAPS Take 500 mg by mouth daily.     Lancets (STERILANCE TL) MISC See admin instructions.     losartan (COZAAR) 50 MG tablet TAKE 1 TABLET(50 MG)  BY MOUTH TWICE DAILY 180 tablet 0   Melatonin 5 MG CAPS Take 5 mg by mouth daily as needed (sleep).     metFORMIN (GLUCOPHAGE-XR) 500 MG 24 hr tablet Take 1,000 mg by mouth daily after supper.     Multiple Vitamins-Minerals (CENTRUM SILVER PO) Take 1 tablet by mouth daily.       omeprazole (PRILOSEC) 20 MG capsule Take 20 mg by mouth every other day.      OXYGEN Inhale 2 L into the lungs daily as needed (SOB).     polyethylene glycol (MIRALAX / GLYCOLAX) 17 g packet Take 17 g by mouth every other day.     rosuvastatin (CRESTOR) 10 MG tablet Take 1 tablet (10 mg total) by mouth at bedtime. 90 tablet 0   traMADol (ULTRAM) 50 MG tablet Take 1 tablet (50 mg total) by mouth every 6 (six) hours as needed. 20 tablet 0   valACYclovir (VALTREX) 500 MG tablet Take 500 mg by mouth 2 (two) times daily as needed (outbreaks). For 3 days     No current facility-administered medications for this visit.    REVIEW OF SYSTEMS:   Constitutional: Denies fevers, chills or abnormal night sweats Eyes: Denies blurriness of vision, double vision or watery eyes Ears, nose, mouth, throat, and face: Denies mucositis or sore throat Respiratory: Denies cough, dyspnea or wheezes Cardiovascular: Denies palpitation, chest discomfort or lower extremity swelling Gastrointestinal:  Denies nausea, heartburn or change in bowel habits Skin: Denies abnormal skin rashes Lymphatics: Denies new lymphadenopathy or easy bruising Neurological:Denies numbness, tingling or new weaknesses Behavioral/Psych: Mood is stable, no new changes  Breast: Denies any palpable lumps or discharge All other systems were reviewed with the patient and are negative.  PHYSICAL EXAMINATION: ECOG PERFORMANCE STATUS: 1 - Symptomatic but completely ambulatory  There were no vitals filed for this visit.   There were no vitals filed for this visit.    Vital signs and physical examination not done, televisit  LABORATORY DATA:  I have reviewed the  data as listed Lab Results  Component Value  Date   WBC 7.7 02/02/2022   HGB 14.1 02/02/2022   HCT 43.0 02/02/2022   MCV 90.3 02/02/2022   PLT 267 02/02/2022   Lab Results  Component Value Date   NA 140 02/02/2022   K 3.9 02/02/2022   CL 101 02/02/2022   CO2 33 (H) 02/02/2022    RADIOGRAPHIC STUDIES: I have personally reviewed the radiological reports and agreed with the findings in the report.  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  Given the very small size of the tumor, we have recommended considering surgery upfront followed by consideration for adjuvant chemotherapy based on the final pathology.  She is now status post surgery, had right breast lumpectomy, final pathology showed 8 mm invasive ductal carcinoma, grade 2, triple negative, KI of 15% Given final pathology showing tumor of 8 mm, we have discussed about considering adjuvant chemotherapy.  We have discussed about docetaxel and cyclophosphamide every 21 days for 4 cycles.  She understands that the role of chemotherapy is to reduce the risk of distant metastasis as well as local recurrence. She wanted to think about chemotherapy and keep [place] posted.  She is here for telephone visit today.  She just told me that she has given it a good thought and she would not like to proceed with adjuvant chemotherapy at this time.  She understands the risks and benefits.  She would like to move forward with adjuvant radiation.  With regards to the swelling about the surgical incision that she reported, this could most likely be postoperative seroma but she has a follow-up with surgery team this Friday for further  evaluation. I do not see a contraindication to proceed with COVID booster or tetanus toxoid booster at this time.  She will return to clinic for follow-up with medical oncology in about 3 to 4 months.   I connected with  Niel Hummer on 03/09/22 by a telephone application and verified that I am speaking with the correct person using two identifiers.   I discussed the limitations of evaluation and management by telemedicine. The patient expressed understanding and agreed to proceed.  Total time spent: 12 minutes    [name], MD 03/09/22

## 2022-03-10 ENCOUNTER — Encounter: Payer: Self-pay | Admitting: *Deleted

## 2022-03-15 DIAGNOSIS — E119 Type 2 diabetes mellitus without complications: Secondary | ICD-10-CM | POA: Diagnosis not present

## 2022-03-22 DIAGNOSIS — I10 Essential (primary) hypertension: Secondary | ICD-10-CM | POA: Diagnosis not present

## 2022-03-22 DIAGNOSIS — Z23 Encounter for immunization: Secondary | ICD-10-CM | POA: Diagnosis not present

## 2022-03-22 DIAGNOSIS — Z0000 Encounter for general adult medical examination without abnormal findings: Secondary | ICD-10-CM | POA: Diagnosis not present

## 2022-03-22 DIAGNOSIS — M810 Age-related osteoporosis without current pathological fracture: Secondary | ICD-10-CM | POA: Diagnosis not present

## 2022-03-22 DIAGNOSIS — C50919 Malignant neoplasm of unspecified site of unspecified female breast: Secondary | ICD-10-CM | POA: Diagnosis not present

## 2022-03-22 DIAGNOSIS — Z1331 Encounter for screening for depression: Secondary | ICD-10-CM | POA: Diagnosis not present

## 2022-03-22 DIAGNOSIS — J449 Chronic obstructive pulmonary disease, unspecified: Secondary | ICD-10-CM | POA: Diagnosis not present

## 2022-03-22 DIAGNOSIS — E1169 Type 2 diabetes mellitus with other specified complication: Secondary | ICD-10-CM | POA: Diagnosis not present

## 2022-03-22 DIAGNOSIS — E785 Hyperlipidemia, unspecified: Secondary | ICD-10-CM | POA: Diagnosis not present

## 2022-03-22 NOTE — Progress Notes (Signed)
Location of Breast Cancer:  Malignant neoplasm of upper-outer quadrant of right breast in female, estrogen receptor positive  Histology per Pathology Report:  02/16/2022 FINAL MICROSCOPIC DIAGNOSIS:  A. BREAST, RIGHT, LUMPECTOMY:  - Invasive ductal carcinoma, 0.8 cm in maximal dimension, coming to 0.4 cm of closest (posterior) resection margin.  - Ductal carcinoma in situ, coming to less than 0.1 cm of closest (posterior) resection margin.  - Biopsy site changes.  B. LYMPH NODE, RIGHT AXILLARY, SENTINEL, EXCISION:  - No metastatic carcinoma in one lymph node (0/1).  C. LYMPH NODE, RIGHT AXILLARY, SENTINEL, EXCISION:  - No metastatic carcinoma in one lymph node (0/1).  D. LYMPH NODE, RIGHT AXILLARY, SENTINEL, EXCISION:  - No metastatic carcinoma in one lymph node (0/1).  E. LYMPH NODE, RIGHT AXILLARY, SENTINEL, EXCISION:  - No metastatic carcinoma in one lymph node (0/1).  F. BREAST, RIGHT FURTHER MEDIAL MARGIN, EXCISION:  - Fibrovascular, adipose, and striated muscle tissue.  - No malignancy.   Receptor Status: ER(30%), PR (0%), Her2-neu (Negative via IHC), Ki-67(20%)  Did patient present with symptoms (if so, please note symptoms) or was this found on screening mammography?: Bilateral screening mammogram on the date of 12/27/21 showed right breast abnormality. Diagnostic right breast mammogram and ultrasound on 01/10/22 further revealed a 6 x 7 x 8 mm irregular hypoechoic right breast mass, located at the 10 o'clock position, 5 cm from the nipple. No right axillary adenopathy was appreciated  Past/Anticipated interventions by surgeon, if any:  03/11/2022 Dr. [name] (office visit) --Physical Exam  She appears well on exam There is discoloration of the nipple areolar complex on the right breast from the injection of the mag trace. Incision in the axilla will remove the cancer and the lymph nodes is stable with minimal swelling and no evidence of infection  --Assessment and  Plan:   She is stable postoperatively. She may resume her normal activity. She will continue her self examinations and close follow-up with the cancer center. I will see her back in 6 months for reevaluation and to see how the discoloration of the nipple areolar complex done. --Return in about 6 months (around 09/11/2022).  02/16/2022 --Dr. [name] RADIOACTIVE SEED GUIDED RIGHT BREAST LUMPECTOMY RIGHT AXILLARY DEEP SENTINEL LYMPH NODE BIOPSY INJECTION OF MAGTRACE FOR LYMPH NODE MAPPING  Past/Anticipated interventions by medical oncology, if any:  Under care of Dr. Arletha Pili Iruku 03/09/2022 Given final pathology showing tumor of 8 mm, we have discussed about considering adjuvant chemotherapy.  We have discussed about docetaxel and cyclophosphamide every 21 days for 4 cycles.  She understands that the role of chemotherapy is to reduce the risk of distant metastasis as well as local recurrence. She wanted to think about chemotherapy and keep [place] posted.  She is here for telephone visit today.  She just told me that she has given it a good thought and she would not like to proceed with adjuvant chemotherapy at this time.  She understands the risks and benefits.  She would like to move forward with adjuvant radiation.   With regards to the swelling about the surgical incision that she reported, this could most likely be postoperative seroma but she has a follow-up with surgery team this Friday for further evaluation. I do not see a contraindication to proceed with COVID booster or tetanus toxoid booster at this time.   She will return to clinic for follow-up with medical oncology in about 3 to 4 months.  Lymphedema issues, if any:  Patient denies; does  report slight swelling/hardness to lateral aspect of breast. Scheduled for PT evaluation next week    Pain issues, if any:  Report mild twinges and a "funny feeling" down the posterior aspect of her right arm   SAFETY ISSUES: Prior  radiation? No Pacemaker/ICD? No Possible current pregnancy? No--postmenopausal Is the patient on methotrexate? No  Current Complaints / other details:  Wears supplemental oxygen with activity

## 2022-03-22 NOTE — Progress Notes (Signed)
Radiation Oncology         [phone] ________________________________  Name: Yolanda Morris MRN: [id number]  Date: 03/23/2022  DOB: 1946/06/03  Follow-Up Visit Note  Outpatient  CC: [name], MD  [name], MD  Diagnosis:   No diagnosis found.   S/p lumpectomy: Right Breast UOQ, Invasive and in-situ ductal carcinoma, ER- / PR- / Her2-, Grade 2   Cancer Staging  Malignant neoplasm of upper-outer quadrant of right breast in female, estrogen receptor positive ([place]) Staging form: Breast, AJCC 8th Edition - Pathologic: Stage IB (pT1b, pN0, cM0, G2, ER-, PR-, HER2-) - Signed by [name], MD on 03/01/2022  CHIEF COMPLAINT: Here to discuss management of right breast cancer  Narrative:  The patient returns today for follow-up.     Since breast clinic consultation date of 02/02/22, she underwent genetic testing on that same date which revealed no clinically significant variants detected by BRCAplus or +RNAinsight testing.     The patient opted to proceed with right breast lumpectomy with nodal biopsies on 02/16/22 under the care of Dr. Ninfa Linden. Pathology from the procedure revealed: tumor size of 0.8 cm; histology of invasive ductal carcinoma with DCIS; all margins negative for both invasive and in-situ carcinoma; margin status to invasive disease of 0.4 cm from the posterior margin; margin status to in situ disease of less than 0.1 cm from the posterior margin; nodal status of 4/4 right axillary sentinel lymph node excisions negative for carcinoma;  ER status: negative; PR status negative; Proliferation marker Ki67 at 15%; Her2 status negative; Grade 2.  Dr. Chryl Heck advised the patient to proceed with chemotherapy, however the patient wishes to not pursue systemic treatment at this time. She will return to Dr. Chryl Heck after completing XRT for follow-up.   During her most recent follow-up with Dr. Ninfa Linden on 03/11/22, the patient was noted to be doing well from a  post-operative standpoint.   Symptomatically, the patient reports: ***        ALLERGIES:  is allergic to telithromycin, bacitracin, and levofloxacin.  Meds: Current Outpatient Medications  Medication Sig Dispense Refill   acetaminophen (TYLENOL) 325 MG tablet Take 650 mg by mouth every 6 (six) hours as needed for moderate pain.     albuterol (PROVENTIL) (2.5 MG/3ML) 0.083% nebulizer solution Take 3 mLs (2.5 mg total) by nebulization every 6 (six) hours as needed for wheezing or shortness of breath. 360 mL 12   albuterol (VENTOLIN HFA) 108 (90 Base) MCG/ACT inhaler Inhale 2 puffs into the lungs every 6 (six) hours as needed for wheezing or shortness of breath. 3 each 3   aspirin EC 81 MG EC tablet Take 81 mg by mouth daily.     cetirizine (ZYRTEC) 10 MG tablet Take 10 mg by mouth daily.     Cholecalciferol (VITAMIN D3) 1000 UNITS CAPS Take 1,000 Units by mouth daily.     Cranberry 500 MG TABS Take 500 mg by mouth daily.     denosumab (PROLIA) 60 MG/ML SOSY injection Inject 60 mg into the skin every 6 (six) months.     fluticasone (FLONASE) 50 MCG/ACT nasal spray Place 1 spray into both nostrils daily.     Fluticasone-Umeclidin-Vilant (TRELEGY ELLIPTA) 100-62.5-25 MCG/ACT AEPB Inhale 1 puff into the lungs daily. 60 each 3   ibuprofen (ADVIL) 200 MG tablet Take 200 mg by mouth every 8 (eight) hours as needed for moderate pain.     IGLUCOSE TEST STRIPS test strip See admin instructions.     L-Lysine  500 MG CAPS Take 500 mg by mouth daily.     Lancets (STERILANCE TL) MISC See admin instructions.     losartan (COZAAR) 50 MG tablet TAKE 1 TABLET(50 MG) BY MOUTH TWICE DAILY 180 tablet 0   Melatonin 5 MG CAPS Take 5 mg by mouth daily as needed (sleep).     metFORMIN (GLUCOPHAGE-XR) 500 MG 24 hr tablet Take 1,000 mg by mouth daily after supper.     Multiple Vitamins-Minerals (CENTRUM SILVER PO) Take 1 tablet by mouth daily.       omeprazole (PRILOSEC) 20 MG capsule Take 20 mg by mouth every other  day.      OXYGEN Inhale 2 L into the lungs daily as needed (SOB).     polyethylene glycol (MIRALAX / GLYCOLAX) 17 g packet Take 17 g by mouth every other day.     rosuvastatin (CRESTOR) 10 MG tablet Take 1 tablet (10 mg total) by mouth at bedtime. 90 tablet 0   traMADol (ULTRAM) 50 MG tablet Take 1 tablet (50 mg total) by mouth every 6 (six) hours as needed. 20 tablet 0   valACYclovir (VALTREX) 500 MG tablet Take 500 mg by mouth 2 (two) times daily as needed (outbreaks). For 3 days     No current facility-administered medications for this encounter.    Physical Findings:  vitals were not taken for this visit. .     General: Alert and oriented, in no acute distress HEENT: Head is normocephalic. Extraocular movements are intact. Oropharynx is clear. Neck: Neck is supple, no palpable cervical or supraclavicular lymphadenopathy. Heart: Regular in rate and rhythm with no murmurs, rubs, or gallops. Chest: Clear to auscultation bilaterally, with no rhonchi, wheezes, or rales. Abdomen: Soft, nontender, nondistended, with no rigidity or guarding. Extremities: No cyanosis or edema. Lymphatics: see Neck Exam Musculoskeletal: symmetric strength and muscle tone throughout. Neurologic: No obvious focalities. Speech is fluent.  Psychiatric: Judgment and insight are intact. Affect is appropriate. Breast exam reveals ***  Lab Findings: Lab Results  Component Value Date   WBC 7.7 02/02/2022   HGB 14.1 02/02/2022   HCT 43.0 02/02/2022   MCV 90.3 02/02/2022   PLT 267 02/02/2022    '@LASTCHEMISTRY'$ @  Radiographic Findings: No results found.  Impression/Plan: We discussed adjuvant radiotherapy today.  I recommend *** in order to ***.  I reviewed the logistics, benefits, risks, and potential side effects of this treatment in detail. Risks may include but not necessary be limited to acute and late injury tissue in the radiation fields such as skin irritation (change in color/pigmentation, itching,  dryness, pain, peeling). She may experience fatigue. We also discussed possible risk of long term cosmetic changes or scar tissue. There is also a smaller risk for lung toxicity, ***cardiac toxicity, ***brachial plexopathy, ***lymphedema, ***musculoskeletal changes, ***rib fragility or ***induction of a second malignancy, ***late chronic non-healing soft tissue wound.    The patient asked good questions which I answered to her satisfaction. She is enthusiastic about proceeding with treatment. A consent form has been *** signed and placed in her chart.  A total of *** medically necessary complex treatment devices will be fabricated and supervised by me: *** fields with MLCs for custom blocks to protect heart, and lungs;  and, a Vac-lok. MORE COMPLEX DEVICES MAY BE MADE IN DOSIMETRY FOR FIELD IN FIELD BEAMS FOR DOSE HOMOGENEITY.  I have requested : 3D Simulation which is medically necessary to give adequate dose to at risk tissues while sparing lungs and heart.  I  have requested a DVH of the following structures: lungs, heart, *** lumpectomy cavity.    The patient will receive *** Gy in *** fractions to the *** with *** fields.  This will be *** followed by a boost.  On date of service, in total, I spent *** minutes on this encounter. Patient was seen in person.  _____________________________________   Eppie Gibson, MD  This document serves as a record of services personally performed by Eppie Gibson, MD. It was created on her behalf by Roney Mans, a trained medical scribe. The creation of this record is based on the scribe's personal observations and the provider's statements to them. This document has been checked and approved by the attending provider.

## 2022-03-23 ENCOUNTER — Encounter: Payer: Self-pay | Admitting: Radiation Oncology

## 2022-03-23 ENCOUNTER — Ambulatory Visit
Admission: RE | Admit: 2022-03-23 | Discharge: 2022-03-23 | Disposition: A | Discharge status: Home / Self Care | Payer: Medicare Other | Source: Ambulatory Visit | Attending: Radiation Oncology | Admitting: Radiation Oncology

## 2022-03-23 ENCOUNTER — Other Ambulatory Visit: Payer: Self-pay

## 2022-03-23 VITALS — BP 144/80 | HR 83 | Temp 97.2°F | Resp 18 | Ht 62.0 in | Wt 154.1 lb

## 2022-03-23 DIAGNOSIS — C50411 Malignant neoplasm of upper-outer quadrant of right female breast: Secondary | ICD-10-CM | POA: Insufficient documentation

## 2022-03-23 DIAGNOSIS — Z170 Estrogen receptor positive status [ER+]: Secondary | ICD-10-CM | POA: Insufficient documentation

## 2022-03-23 DIAGNOSIS — Z510 Encounter for antineoplastic radiation therapy: Secondary | ICD-10-CM | POA: Insufficient documentation

## 2022-03-23 DIAGNOSIS — Z79899 Other long term (current) drug therapy: Secondary | ICD-10-CM | POA: Diagnosis not present

## 2022-03-23 DIAGNOSIS — Z7984 Long term (current) use of oral hypoglycemic drugs: Secondary | ICD-10-CM | POA: Diagnosis not present

## 2022-03-23 DIAGNOSIS — Z7982 Long term (current) use of aspirin: Secondary | ICD-10-CM | POA: Insufficient documentation

## 2022-03-25 ENCOUNTER — Encounter: Payer: Self-pay | Admitting: Cardiology

## 2022-03-25 ENCOUNTER — Ambulatory Visit: Payer: Medicare Other | Admitting: Cardiology

## 2022-03-25 VITALS — BP 152/76 | HR 76 | Temp 97.2°F | Resp 16 | Ht 62.0 in | Wt 154.6 lb

## 2022-03-25 DIAGNOSIS — E782 Mixed hyperlipidemia: Secondary | ICD-10-CM | POA: Diagnosis not present

## 2022-03-25 DIAGNOSIS — C50411 Malignant neoplasm of upper-outer quadrant of right female breast: Secondary | ICD-10-CM | POA: Diagnosis not present

## 2022-03-25 DIAGNOSIS — Z510 Encounter for antineoplastic radiation therapy: Secondary | ICD-10-CM | POA: Diagnosis not present

## 2022-03-25 DIAGNOSIS — I10 Essential (primary) hypertension: Secondary | ICD-10-CM | POA: Diagnosis not present

## 2022-03-25 DIAGNOSIS — E119 Type 2 diabetes mellitus without complications: Secondary | ICD-10-CM

## 2022-03-25 DIAGNOSIS — I6522 Occlusion and stenosis of left carotid artery: Secondary | ICD-10-CM

## 2022-03-25 DIAGNOSIS — Z170 Estrogen receptor positive status [ER+]: Secondary | ICD-10-CM | POA: Diagnosis not present

## 2022-03-25 NOTE — Progress Notes (Signed)
ID:  Yolanda Morris, DOB 07/29/1946, MRN [id number]  PCP:  [name], MD  Cardiologist:  Rex Kras, DO, [facility] (established care 07/01/21 )  Date: 03/25/22 Last Office Visit: September 24, 2021  Chief Complaint  Patient presents with   carotid disease   Follow-up    HPI  Yolanda Morris is a 76 y.o. female whose past medical history and cardiovascular risk factors include: Right Breast IDC, Left carotid artery stenosis, hypertension, COPD on home oxygen 2L [place], diabetes, hyperlipidemia, genital herpes, history of migraines, former smoker, postmenopausal female, advance age.   Referred to the practice for evaluation of dizziness and chest pain.  She has undergone an ischemic work-up as outlined below and since then has done well after starting her 2 L nasal cannula oxygen use regularly.  During the work-up of dizziness she had a carotid duplex which noted coronary artery stenosis.  She is currently on Crestor 10 mg p.o. nightly.  Since last office visit patient had a mammogram in March 2023 which was abnormal and subsequently underwent biopsy and lumpectomy.  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  However, patient chooses to proceed with radiation therapy.  In the interim she is also had labs with PCP which she provides me a copy for review.  Summarized findings noted below.  Clinically she denies anginal discomfort or heart failure symptoms.  Patient states that due to the recent course of events she has not been as active with walking and exercising has been less strict with her diet and as result her A1c as well as lipids have trended up.  FUNCTIONAL STATUS: No structured exercise program or daily routine.   ALLERGIES: Allergies  Allergen Reactions   Telithromycin     GI Upset    Bacitracin Rash    Scarring     Levofloxacin Rash    MEDICATION LIST PRIOR TO VISIT: Current Meds   Medication Sig   acetaminophen (TYLENOL) 325 MG tablet Take 650 mg by mouth every 6 (six) hours as needed for moderate pain.   albuterol (PROVENTIL) (2.5 MG/3ML) 0.083% nebulizer solution Take 3 mLs (2.5 mg total) by nebulization every 6 (six) hours as needed for wheezing or shortness of breath.   albuterol (VENTOLIN HFA) 108 (90 Base) MCG/ACT inhaler Inhale 2 puffs into the lungs every 6 (six) hours as needed for wheezing or shortness of breath.   aspirin EC 81 MG EC tablet Take 81 mg by mouth daily.   cetirizine (ZYRTEC) 10 MG tablet Take 10 mg by mouth daily.   Cholecalciferol (VITAMIN D3) 1000 UNITS CAPS Take 1,000 Units by mouth daily.   Cranberry 500 MG TABS Take 500 mg by mouth daily.   denosumab (PROLIA) 60 MG/ML SOSY injection Inject 60 mg into the skin every 6 (six) months.   fluticasone (FLONASE) 50 MCG/ACT nasal spray Place 1 spray into both nostrils daily.   Fluticasone-Umeclidin-Vilant (TRELEGY ELLIPTA) 100-62.5-25 MCG/ACT AEPB Inhale 1 puff into the lungs daily.   ibuprofen (ADVIL) 200 MG tablet Take 200 mg by mouth every 8 (eight) hours as needed for moderate pain.   IGLUCOSE TEST STRIPS test strip See admin instructions.   L-Lysine 500 MG CAPS Take 500 mg by mouth daily.   Lancets (STERILANCE TL) MISC See admin instructions.   losartan (COZAAR) 50 MG tablet TAKE 1 TABLET(50 MG) BY MOUTH TWICE DAILY   Melatonin 5 MG CAPS Take 5 mg by  mouth daily as needed (sleep).   metFORMIN (GLUCOPHAGE-XR) 500 MG 24 hr tablet Take 1,000 mg by mouth daily after supper.   Multiple Vitamins-Minerals (CENTRUM SILVER PO) Take 1 tablet by mouth daily.     omeprazole (PRILOSEC) 20 MG capsule Take 20 mg by mouth every other day.    OXYGEN Inhale 2 L into the lungs daily as needed (SOB).   polyethylene glycol (MIRALAX / GLYCOLAX) 17 g packet Take 17 g by mouth every other day.   rosuvastatin (CRESTOR) 10 MG tablet Take 1 tablet (10 mg total) by mouth at bedtime.   traMADol (ULTRAM) 50 MG tablet Take  1 tablet (50 mg total) by mouth every 6 (six) hours as needed.   valACYclovir (VALTREX) 500 MG tablet Take 500 mg by mouth 2 (two) times daily as needed (outbreaks). For 3 days     PAST MEDICAL HISTORY: Past Medical History:  Diagnosis Date   Cancer ([place])    skin   COPD (chronic obstructive pulmonary disease) (HCC)    DM (diabetes mellitus) ([place])    Dyspnea    increased activity    Family history of ovarian cancer 02/03/2022   Family history of stomach cancer 02/03/2022   Genital herpes    GERD (gastroesophageal reflux disease)    Hyperlipidemia    Migraines    Osteoporosis    Unspecified essential hypertension     PAST SURGICAL HISTORY: Past Surgical History:  Procedure Laterality Date   BREAST BIOPSY     BREAST EXCISIONAL BIOPSY     over 20 years ago- benign   BREAST LUMPECTOMY WITH RADIOACTIVE SEED AND SENTINEL LYMPH NODE BIOPSY Right 02/16/2022   Procedure: RIGHT BREAST LUMPECTOMY WITH RADIOACTIVE SEED AND SENTINEL LYMPH NODE BIOPSY;  Surgeon: [name], MD;  Location: [facility];  Service: General;  Laterality: Right;   BUNIONECTOMY Left 2004   and hammer toe repair Left foot   COLON SURGERY     removal of polyps   DENTAL SURGERY     RETINAL TEAR REPAIR CRYOTHERAPY      FAMILY HISTORY: The patient family history includes Alcohol abuse in her brother, father, and sister; COPD in her mother; Cancer in her maternal aunt and maternal uncle; Heart attack in her sister; Heart disease in her mother; Ovarian cancer in her paternal aunt; Stomach cancer (age of onset: 78) in her paternal aunt.  SOCIAL HISTORY:  The patient  reports that she quit smoking about 25 years ago. Her smoking use included cigarettes. She has a 50.00 pack-year smoking history. She has never used smokeless tobacco. She reports that she does not drink alcohol and does not use drugs.  REVIEW OF SYSTEMS: Review of Systems  Constitutional: Negative for chills and fever.  HENT:  Negative for ear pain,  hoarse voice and nosebleeds.   Eyes:  Negative for discharge, double vision and pain.  Cardiovascular:  Negative for chest pain, claudication, dyspnea on exertion, leg swelling, near-syncope, orthopnea, palpitations, paroxysmal nocturnal dyspnea and syncope.  Respiratory:  Positive for shortness of breath (chronic and stable). Negative for hemoptysis.   Musculoskeletal:  Negative for muscle cramps and myalgias.  Gastrointestinal:  Negative for abdominal pain, constipation, diarrhea, hematemesis, hematochezia, melena, nausea and vomiting.  Neurological:  Negative for dizziness, light-headedness and vertigo.    PHYSICAL EXAM:    03/25/2022   10:19 AM 03/25/2022   10:15 AM 03/23/2022    8:34 AM  Vitals with BMI  Height  5' 2"  5' 2"   Weight  154 lbs  10 oz 154 lbs 2 oz  BMI  83.33 83.29  Systolic 191 660 600  Diastolic 76 64 80  Pulse 76 89 83    CONSTITUTIONAL: Well-developed and well-nourished. No acute distress.  SKIN: Skin is warm and dry. No rash noted. No cyanosis. No pallor. No jaundice HEAD: Normocephalic and atraumatic.  EYES: No scleral icterus MOUTH/THROAT: Moist oral membranes.  NECK: No JVD present. No thyromegaly noted. No carotid bruits  CHEST Normal respiratory effort. No intercostal retractions  LUNGS: Decreased breath sounds bilaterally, poor inspiratory effort, kyphosis noted in the posterior chest wall no stridor. No wheezes. No rales.  CARDIOVASCULAR: Regular, positive K5-T9, soft holosystolic murmur, no gallops or rubs ABDOMINAL: No apparent ascites.  EXTREMITIES: No peripheral edema, warm to touch, 2+ DP and PT pulses. HEMATOLOGIC: No significant bruising NEUROLOGIC: Oriented to person, place, and time. Nonfocal. Normal muscle tone.  PSYCHIATRIC: Normal mood and affect. Normal behavior. Cooperative  CARDIAC DATABASE: EKG: 03/25/2022: Normal sinus rhythm, 84 bpm, normal axis, without underlying ischemia or injury pattern.  Echocardiogram: 07/27/2021: Left  ventricle cavity is normal in size. Mild concentric hypertrophy of the left ventricle. Normal global wall motion.  Normal LV systolic function with EF 61%. Doppler evidence of grade I (impaired) diastolic dysfunction, normal LAP.  Mild (Grade I) mitral regurgitation. Estimated right atrial pressure 8 mmHg.   Stress Testing: Modified Bruce/Lexiscan Tetrofosmin stress test 07/14/2021: Lexiscan/modified Bruce nuclear stress test performed using 1-day protocol. Patient achieved 2.2 METS on modified Bruce protocol and reached 108% MPHR. Stress EKG at 108% MPHR showed sinus tachycardia, no ST-T wave abnormality. Normal myocardial perfusion. Stress LVEF 63%. Low risk study.  Heart Catheterization: None  Carotid artery duplex 07/27/2021:  Duplex suggests stenosis in the right internal carotid artery (1-15%).  Duplex suggests stenosis in the right external carotid artery (<50%). Duplex suggests stenosis in the left internal carotid artery (16-49%).  Duplex suggests stenosis in the left external carotid artery.  There is homogenous plaque in the right carotid artery and heterogeneous plaque in the left carotid artery. Antegrade right vertebral artery flow. Antegrade left vertebral artery flow. Follow up in one year is appropriate if clinically indicated.  LABORATORY DATA:    Latest Ref Rng & Units 02/02/2022   12:12 PM  CBC  WBC 4.0 - 10.5 K/uL 7.7   Hemoglobin 12.0 - 15.0 g/dL 14.1   Hematocrit 36.0 - 46.0 % 43.0   Platelets 150 - 400 K/uL 267       Latest Ref Rng & Units 02/02/2022   12:12 PM 12/30/2021    9:28 AM 09/16/2021    9:10 AM  CMP  Glucose 70 - 99 mg/dL 202  139  128   BUN 8 - 23 mg/dL 11  11  8    Creatinine 0.44 - 1.00 mg/dL 0.64  0.63  0.63   Sodium 135 - 145 mmol/L 140  141  145   Potassium 3.5 - 5.1 mmol/L 3.9  4.3  4.3   Chloride 98 - 111 mmol/L 101  99  101   CO2 22 - 32 mmol/L 33  28  30   Calcium 8.9 - 10.3 mg/dL 9.8  10.6  11.1   Total Protein 6.5 - 8.1 g/dL 7.6   7.6  7.3   Total Bilirubin 0.3 - 1.2 mg/dL 0.4  0.4  0.4   Alkaline Phos 38 - 126 U/L 74  88  79   AST 15 - 41 U/L 19  22  24    ALT 0 -  44 U/L 18  18  22      Lipid Panel     Component Value Date/Time   CHOL 187 12/30/2021 0924   TRIG 137 12/30/2021 0924   HDL 69 12/30/2021 0924   CHOLHDL 2.7 12/30/2021 0924   LDLCALC 94 12/30/2021 0924   LDLDIRECT 84 12/30/2021 0927   LABVLDL 24 12/30/2021 0924    No components found for: "NTPROBNP" No results for input(s): "PROBNP" in the last 8760 hours. No results for input(s): "TSH" in the last 8760 hours.  BMP Recent Labs    09/16/21 0910 12/30/21 0928 02/02/22 1212  NA 145* 141 140  K 4.3 4.3 3.9  CL 101 99 101  CO2 30* 28 33*  GLUCOSE 128* 139* 202*  BUN 8 11 11   CREATININE 0.63 0.63 0.64  CALCIUM 11.1* 10.6* 9.8  GFRNONAA  --   --  >60    HEMOGLOBIN A1C Lab Results  Component Value Date   HGBA1C 6.9 (H) 02/09/2022   MPG 151.33 02/09/2022    External Labs: Collected: 06/12/2019.  Provided by the referring physician. Sodium 143, potassium 4.7, chloride 103, bicarb 28, BUN 9, creatinine 0.6 AST 18, ALT 16, alkaline phosphatase 75 Hemoglobin 14.7 g/dL, hematocrit 44.8%.  External Labs: Collected: March 22, 2022 provided by PCP Total cholesterol 174, triglycerides 150, HDL 68, LDL 80, non-HDL 106  Hemoglobin 14 g/dL, hematocrit 41.6% BUN 11, creatinine 0.55. eGFR 95. Sodium 141, potassium 4.3, chloride 101, bicarb 32. AST 19, ALT 16, alkaline phosphatase 73 Hemoglobin A1c 7.1   IMPRESSION:    ICD-10-CM   1. Stenosis of left internal carotid artery  I65.22 EKG 12-Lead    2. Mixed hyperlipidemia  E78.2     3. Benign hypertension  I10     4. Type 2 diabetes mellitus without complication, without long-term current use of insulin (HCC)  E11.9         RECOMMENDATIONS: Yolanda Morris is a 76 y.o. female whose past medical history and cardiac risk factors include: Left carotid artery stenosis, hypertension,  COPD on home oxygen 2L [place], diabetes, hyperlipidemia, genital herpes, history of migraines, former smoker, postmenopausal female, advance age.   Stenosis of left internal carotid artery Asymptomatic. Left ICA stenosis less than 50%; follow-up study scheduled for October 2023 Tolerating Crestor 10 mg p.o. nightly. Outside labs independently reviewed and noted above for further reference. Due to dietary indiscretion patient's A1c, triglycerides, LDL have trended up in the recent past.  Patient states that it is likely due to her recent diagnosis of breast cancer that she has been less active and consuming more calories.  Knowing the recent lab indices patient states that she will make an conscious effort not becoming more active and reducing her caloric intake. Would like to recheck her labs prior to the next office visit to see if up titration of medical therapy is warranted.  Patient is agreeable with the plan of care  Mixed hyperlipidemia Currently on Crestor.   She denies myalgia or other side effects. Most recent lipids dated June 2023 reviewed as noted above. Plans to have a repeat fasting lipid profile and CMP prior to the next office visit.  Labs still need to be ordered and will be done closer to her follow-up visit  Benign hypertension Office blood pressures are not well controlled.   Home blood pressures are better controlled. Blood pressure log reviewed. Medications reconciled. Currently managed by primary care provider.  Type 2 diabetes mellitus without complication, without long-term current use  of insulin ([place]) Most recent hemoglobin A1c independently reviewed. Educated on the importance of glycemic control. Continue ARB and statin therapy.  FINAL MEDICATION LIST END OF ENCOUNTER: No orders of the defined types were placed in this encounter.    There are no discontinued medications.    Current Outpatient Medications:    acetaminophen (TYLENOL) 325 MG tablet, Take 650  mg by mouth every 6 (six) hours as needed for moderate pain., Disp: , Rfl:    albuterol (PROVENTIL) (2.5 MG/3ML) 0.083% nebulizer solution, Take 3 mLs (2.5 mg total) by nebulization every 6 (six) hours as needed for wheezing or shortness of breath., Disp: 360 mL, Rfl: 12   albuterol (VENTOLIN HFA) 108 (90 Base) MCG/ACT inhaler, Inhale 2 puffs into the lungs every 6 (six) hours as needed for wheezing or shortness of breath., Disp: 3 each, Rfl: 3   aspirin EC 81 MG EC tablet, Take 81 mg by mouth daily., Disp: , Rfl:    cetirizine (ZYRTEC) 10 MG tablet, Take 10 mg by mouth daily., Disp: , Rfl:    Cholecalciferol (VITAMIN D3) 1000 UNITS CAPS, Take 1,000 Units by mouth daily., Disp: , Rfl:    Cranberry 500 MG TABS, Take 500 mg by mouth daily., Disp: , Rfl:    denosumab (PROLIA) 60 MG/ML SOSY injection, Inject 60 mg into the skin every 6 (six) months., Disp: , Rfl:    fluticasone (FLONASE) 50 MCG/ACT nasal spray, Place 1 spray into both nostrils daily., Disp: , Rfl:    Fluticasone-Umeclidin-Vilant (TRELEGY ELLIPTA) 100-62.5-25 MCG/ACT AEPB, Inhale 1 puff into the lungs daily., Disp: 60 each, Rfl: 3   ibuprofen (ADVIL) 200 MG tablet, Take 200 mg by mouth every 8 (eight) hours as needed for moderate pain., Disp: , Rfl:    IGLUCOSE TEST STRIPS test strip, See admin instructions., Disp: , Rfl:    L-Lysine 500 MG CAPS, Take 500 mg by mouth daily., Disp: , Rfl:    Lancets (STERILANCE TL) MISC, See admin instructions., Disp: , Rfl:    losartan (COZAAR) 50 MG tablet, TAKE 1 TABLET(50 MG) BY MOUTH TWICE DAILY, Disp: 180 tablet, Rfl: 0   Melatonin 5 MG CAPS, Take 5 mg by mouth daily as needed (sleep)., Disp: , Rfl:    metFORMIN (GLUCOPHAGE-XR) 500 MG 24 hr tablet, Take 1,000 mg by mouth daily after supper., Disp: , Rfl:    Multiple Vitamins-Minerals (CENTRUM SILVER PO), Take 1 tablet by mouth daily.  , Disp: , Rfl:    omeprazole (PRILOSEC) 20 MG capsule, Take 20 mg by mouth every other day. , Disp: , Rfl:     OXYGEN, Inhale 2 L into the lungs daily as needed (SOB)., Disp: , Rfl:    polyethylene glycol (MIRALAX / GLYCOLAX) 17 g packet, Take 17 g by mouth every other day., Disp: , Rfl:    rosuvastatin (CRESTOR) 10 MG tablet, Take 1 tablet (10 mg total) by mouth at bedtime., Disp: 90 tablet, Rfl: 0   traMADol (ULTRAM) 50 MG tablet, Take 1 tablet (50 mg total) by mouth every 6 (six) hours as needed., Disp: 20 tablet, Rfl: 0   valACYclovir (VALTREX) 500 MG tablet, Take 500 mg by mouth 2 (two) times daily as needed (outbreaks). For 3 days, Disp: , Rfl:   Orders Placed This Encounter  Procedures   EKG 12-Lead   There are no Patient Instructions on file for this visit.   --Continue cardiac medications as reconciled in final medication list. --Return in about 6 months (around 09/24/2022) for  Follow up, Carotid disease, Lipid. Or sooner if needed. --Continue follow-up with your primary care physician regarding the management of your other chronic comorbid conditions.  Patient's questions and concerns were addressed to her satisfaction. She voices understanding of the instructions provided during this encounter.   This note was created using a voice recognition software as a result there may be grammatical errors inadvertently enclosed that do not reflect the nature of this encounter. Every attempt is made to correct such errors.  Rex Kras, [place], [facility]  Pager: [phone] Office: [phone]

## 2022-03-28 ENCOUNTER — Other Ambulatory Visit: Payer: Self-pay | Admitting: *Deleted

## 2022-03-28 ENCOUNTER — Ambulatory Visit: Payer: Medicare Other | Attending: Surgery | Admitting: Rehabilitation

## 2022-03-28 ENCOUNTER — Encounter: Payer: Self-pay | Admitting: *Deleted

## 2022-03-28 ENCOUNTER — Encounter: Payer: Self-pay | Admitting: Rehabilitation

## 2022-03-28 DIAGNOSIS — M6281 Muscle weakness (generalized): Secondary | ICD-10-CM

## 2022-03-28 DIAGNOSIS — R29898 Other symptoms and signs involving the musculoskeletal system: Secondary | ICD-10-CM | POA: Diagnosis not present

## 2022-03-28 DIAGNOSIS — C50411 Malignant neoplasm of upper-outer quadrant of right female breast: Secondary | ICD-10-CM

## 2022-03-28 DIAGNOSIS — R293 Abnormal posture: Secondary | ICD-10-CM

## 2022-03-28 DIAGNOSIS — Z170 Estrogen receptor positive status [ER+]: Secondary | ICD-10-CM | POA: Diagnosis not present

## 2022-03-28 DIAGNOSIS — M546 Pain in thoracic spine: Secondary | ICD-10-CM

## 2022-03-28 NOTE — Patient Instructions (Addendum)
     [facility]  [address], [address]  [place] [address]  [phone]  After Breast Cancer Class It is recommended you attend the ABC class to be educated on lymphedema risk reduction. This class is free of charge and lasts for 1 hour. It is a 1-time class. You will need to download the Webex app either on your phone or computer. We will send you a link the night before or the morning of the class. You should be able to click on that link to join the class. This is not a confidential class. You don't have to turn your camera on, but other participants may be able to see your email address. Your time will be:   Scar massage You can begin gentle scar massage to you incision sites. Gently place one hand on the incision and move the skin (without sliding on the skin) in various directions. Do this for a few minutes and then you can gently massage either coconut oil or vitamin E cream into the scars.  Compression garment You should continue wearing your compression bra until you feel like you no longer have swelling.  Home exercise Program Continue doing the exercises you were given until you feel like you can do them without feeling any tightness at the end.   Walking Program Studies show that 30 minutes of walking per day (fast enough to elevate your heart rate) can significantly reduce the risk of a cancer recurrence. If you can't walk due to other medical reasons, we encourage you to find another activity you could do (like a stationary bike or water exercise).  Posture After breast cancer surgery, people frequently sit with rounded shoulders posture because it puts their incisions on slack and feels better. If you sit like this and scar tissue forms in that position, you can become very tight and have pain sitting or standing with good posture. Try to be aware of your posture and sit and stand up tall to heal properly.  Follow up PT: It is recommended you  return every 3 months for the first 2 years following surgery to be assessed on the SOZO machine for an L-Dex score. This helps prevent clinically significant lymphedema in 95% of patients. These follow up screens are 10 minute appointments that you are not billed for. Your appointment will be:

## 2022-03-28 NOTE — Therapy (Signed)
OUTPATIENT PHYSICAL THERAPY BREAST CANCER POST OP FOLLOW UP   Patient Name: Yolanda Morris MRN: [id number] DOB:04-25-1946, 76 y.o., female Today's Date: 03/28/2022   PT End of Session - 03/28/22 1046     Visit Number 2    Number of Visits 2    Date for PT Re-Evaluation 03/30/22    PT Start Time 1000    PT Stop Time 1038    PT Time Calculation (min) 38 min    Activity Tolerance Patient tolerated treatment well    Behavior During Therapy WFL for tasks assessed/performed             Past Medical History:  Diagnosis Date   Cancer ([place])    skin   COPD (chronic obstructive pulmonary disease) ([place])    DM (diabetes mellitus) ([place])    Dyspnea    increased activity    Family history of ovarian cancer 02/03/2022   Family history of stomach cancer 02/03/2022   Genital herpes    GERD (gastroesophageal reflux disease)    Hyperlipidemia    Migraines    Osteoporosis    Unspecified essential hypertension    Past Surgical History:  Procedure Laterality Date   BREAST BIOPSY     BREAST EXCISIONAL BIOPSY     over 20 years ago- benign   BREAST LUMPECTOMY WITH RADIOACTIVE SEED AND SENTINEL LYMPH NODE BIOPSY Right 02/16/2022   Procedure: RIGHT BREAST LUMPECTOMY WITH RADIOACTIVE SEED AND SENTINEL LYMPH NODE BIOPSY;  Surgeon: [name], MD;  Location: [facility];  Service: General;  Laterality: Right;   BUNIONECTOMY Left 2004   and hammer toe repair Left foot   COLON SURGERY     removal of polyps   [place]     Patient Active Problem List   Diagnosis Date Noted   Genetic testing 02/09/2022   Family history of ovarian cancer 02/03/2022   Family history of stomach cancer 02/03/2022   Malignant neoplasm of upper-outer quadrant of right breast in female, estrogen receptor positive ([place]) 01/28/2022   Medication management 07/21/2020   Healthcare maintenance 07/21/2020   Body aches 07/22/2019   Chronic respiratory failure with hypoxia  ([place]) 06/17/2019   Allergic rhinitis 11/13/2018   Shortness of breath 10/23/2018   COPD exacerbation ([place]) 01/07/2015   Cough 07/05/2013   Acute sinusitis 02/12/2013   Acute bronchitis 07/08/2011   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) 01/05/2011    PCP: Alben Spittle, MD  REFERRING PROVIDER: Dr. Ninfa Linden  REFERRING DIAG: Rt breast cancer  THERAPY DIAG:  Abnormal posture  Malignant neoplasm of upper-outer quadrant of right breast in female, estrogen receptor positive ([facility])  Pain in thoracic spine  Muscle weakness (generalized)  Other symptoms and signs involving the musculoskeletal system  Rationale for Evaluation and Treatment Rehabilitation  ONSET DATE: 02/16/22  SUBJECTIVE:  SUBJECTIVE STATEMENT: My compression fracture is bothering me the past few weeks.  I start my radiation on Thursday.  The radiation position hurt my arm. I can still feel my stretches  PERTINENT HISTORY:  Patient was diagnosed on 12/27/2021 with right grade 2 invasive ductal carcinoma breast cancer. It measures 8 mm and is located in the upper outer quadrant. It is weakly ER positive, PR negative, and HER2 negative with a Ki67 of 20%. She has stage 4 COPD and is on 2 liters of oxygen. Post Rt breast lumpectomy with no carcinoma in 4 removed nodes on 02/16/22. Will be having radiation.   PATIENT GOALS:  Reassess how my recovery is going related to arm function, pain, and swelling.  PAIN:  Are you having pain? Yes more so in my back , I did have intermittent breast pains but overall my breast and armpit feel good.  PRECAUTIONS: Recent Surgery, right UE Lymphedema risk  ACTIVITY LEVEL / LEISURE: It terms of my breast surgery it    OBJECTIVE:   PATIENT SURVEYS:  QUICK DASH: 27%  OBSERVATIONS:  Thoracic  kyphosis  POSTURE:  kyphosis  LYMPHEDEMA ASSESSMENT:  UPPER EXTREMITY AROM/PROM:   A/PROM RIGHT  02/02/2022   03/28/22  Shoulder extension 47 45  Shoulder flexion 117 limited by kyphosis 125  Shoulder abduction 131 150  Shoulder internal rotation 74   Shoulder external rotation 74 80                          (Blank rows = not tested)   A/PROM LEFT  02/02/2022 03/28/22  Shoulder extension 56   Shoulder flexion 114   Shoulder abduction 120   Shoulder internal rotation 72   Shoulder external rotation 73                           (Blank rows = not tested)     CERVICAL AROM: All within normal limits   UPPER EXTREMITY STRENGTH: WNL    LYMPHEDEMA ASSESSMENTS:    LANDMARK RIGHT  02/02/2022 03/28/22  10 cm proximal to olecranon process 25.8 26  Olecranon process 23.4 24  10  cm proximal to ulnar styloid process 19 19  Just proximal to ulnar styloid process 14.3 14.7  Across hand at thumb web space 16.9 17  At base of 2nd digit 6.1 6.4  (Blank rows = not tested)   LANDMARK LEFT  02/02/2022 03/28/22  10 cm proximal to olecranon process 26.2 26.2  Olecranon process 23.4 23.4  10 cm proximal to ulnar styloid process 18.5 18.5  Just proximal to ulnar styloid process 13.9 14  Across hand at thumb web space 17.4 17  At base of 2nd digit 6.4 6.4  (Blank rows = not tested)   PATIENT EDUCATION:  Education details: per instruction section for today Person educated: Patient Education method: Explanation and Handouts Education comprehension: verbalized understanding   HOME EXERCISE PROGRAM:  Reviewed previously given post op HEP.  ASSESSMENT:  CLINICAL IMPRESSION: Pt presents post Rt lumpectomy with return to baseline AROM and even improved after starting stretches.  Pt has no needs for cancer rehab except for ABC class and continued SOZO screens, but pt would like to return to PT for her osteoporosis/compression fracture that she had done previously in [place] so a note was sent  to Dr. Chryl Heck to request this.    Pt will benefit from skilled therapeutic intervention to improve on  the following deficits: Decreased knowledge of precautions, impaired UE functional use, pain, decreased ROM, postural dysfunction.   PT treatment/interventions: ADL/Self care home management, Patient/Family education  GOALS: Goals reviewed with patient? Yes  LONG TERM GOALS:  (STG=LTG)  GOALS Name Target Date  Goal status  1 Pt will demonstrate she has regained full shoulder ROM and function post operatively compared to baselines.  Baseline: 03/28/22 MET  2 Pt will be scheduled for continued SOZO surveillance 03/28/22 MET               PLAN: PT FREQUENCY/DURATION: sozo surveillance only every 3 months for the first 2 years  PLAN FOR NEXT SESSION: SoZO until at least 02/17/24   [facility]  [address]  [place] [address]  [phone]  After Breast Cancer Class It is recommended you attend the ABC class to be educated on lymphedema risk reduction. This class is free of charge and lasts for 1 hour. It is a 1-time class. You will need to download the Webex app either on your phone or computer. We will send you a link the night before or the morning of the class. You should be able to click on that link to join the class. This is not a confidential class. You don't have to turn your camera on, but other participants may be able to see your email address.  Scar massage You can begin gentle scar massage to you incision sites. Gently place one hand on the incision and move the skin (without sliding on the skin) in various directions. Do this for a few minutes and then you can gently massage either coconut oil or vitamin E cream into the scars.  Compression garment You should continue wearing your compression bra until you feel like you no longer have swelling.  Home exercise Program Continue doing the exercises you were given until you feel like you  can do them without feeling any tightness at the end.   Walking Program Studies show that 30 minutes of walking per day (fast enough to elevate your heart rate) can significantly reduce the risk of a cancer recurrence. If you can't walk due to other medical reasons, we encourage you to find another activity you could do (like a stationary bike or water exercise).  Posture After breast cancer surgery, people frequently sit with rounded shoulders posture because it puts their incisions on slack and feels better. If you sit like this and scar tissue forms in that position, you can become very tight and have pain sitting or standing with good posture. Try to be aware of your posture and sit and stand up tall to heal properly.  Follow up PT: It is recommended you return every 3 months for the first 3 years following surgery to be assessed on the SOZO machine for an L-Dex score. This helps prevent clinically significant lymphedema in 95% of patients. These follow up screens are 10 minute appointments that you are not billed for.  Stark Bray, PT 03/28/2022, 10:47 AM

## 2022-03-28 NOTE — Progress Notes (Signed)
Per Dr. Chryl Heck -   entered referral for PT for increased back pain due to osteoporosis. She is post lumpectomy.  She would like to go to [facility] location as she has been there before.

## 2022-03-31 ENCOUNTER — Other Ambulatory Visit: Payer: Self-pay

## 2022-03-31 ENCOUNTER — Encounter: Payer: Self-pay | Admitting: *Deleted

## 2022-03-31 ENCOUNTER — Ambulatory Visit
Admission: RE | Admit: 2022-03-31 | Discharge: 2022-03-31 | Disposition: A | Discharge status: Home / Self Care | Payer: Medicare Other | Source: Ambulatory Visit | Attending: Radiation Oncology | Admitting: Radiation Oncology

## 2022-03-31 DIAGNOSIS — Z170 Estrogen receptor positive status [ER+]: Secondary | ICD-10-CM | POA: Diagnosis not present

## 2022-03-31 DIAGNOSIS — C50411 Malignant neoplasm of upper-outer quadrant of right female breast: Secondary | ICD-10-CM | POA: Diagnosis not present

## 2022-03-31 DIAGNOSIS — Z510 Encounter for antineoplastic radiation therapy: Secondary | ICD-10-CM | POA: Diagnosis not present

## 2022-03-31 LAB — RAD ONC ARIA SESSION SUMMARY
Course Elapsed Days: 0
Plan Fractions Treated to Date: 1
Plan Prescribed Dose Per Fraction: 2.67 Gy
Plan Total Fractions Prescribed: 15
Plan Total Prescribed Dose: 40.05 Gy
Reference Point Dosage Given to Date: 2.67 Gy
Reference Point Session Dosage Given: 2.67 Gy
Session Number: 1

## 2022-04-01 ENCOUNTER — Ambulatory Visit
Admission: RE | Admit: 2022-04-01 | Discharge: 2022-04-01 | Disposition: A | Discharge status: Home / Self Care | Payer: Medicare Other | Source: Ambulatory Visit | Attending: Radiation Oncology | Admitting: Radiation Oncology

## 2022-04-01 ENCOUNTER — Other Ambulatory Visit: Payer: Self-pay

## 2022-04-01 DIAGNOSIS — Z510 Encounter for antineoplastic radiation therapy: Secondary | ICD-10-CM | POA: Diagnosis not present

## 2022-04-01 DIAGNOSIS — C50411 Malignant neoplasm of upper-outer quadrant of right female breast: Secondary | ICD-10-CM | POA: Diagnosis not present

## 2022-04-01 DIAGNOSIS — Z170 Estrogen receptor positive status [ER+]: Secondary | ICD-10-CM | POA: Diagnosis not present

## 2022-04-01 LAB — RAD ONC ARIA SESSION SUMMARY
Course Elapsed Days: 1
Plan Fractions Treated to Date: 2
Plan Prescribed Dose Per Fraction: 2.67 Gy
Plan Total Fractions Prescribed: 15
Plan Total Prescribed Dose: 40.05 Gy
Reference Point Dosage Given to Date: 5.34 Gy
Reference Point Session Dosage Given: 2.67 Gy
Session Number: 2

## 2022-04-04 ENCOUNTER — Ambulatory Visit
Admission: RE | Admit: 2022-04-04 | Discharge: 2022-04-04 | Disposition: A | Discharge status: Home / Self Care | Payer: Medicare Other | Source: Ambulatory Visit | Attending: Radiation Oncology | Admitting: Radiation Oncology

## 2022-04-04 ENCOUNTER — Other Ambulatory Visit: Payer: Self-pay

## 2022-04-04 ENCOUNTER — Ambulatory Visit: Payer: Medicare Other

## 2022-04-04 DIAGNOSIS — Z170 Estrogen receptor positive status [ER+]: Secondary | ICD-10-CM | POA: Diagnosis not present

## 2022-04-04 DIAGNOSIS — C50411 Malignant neoplasm of upper-outer quadrant of right female breast: Secondary | ICD-10-CM | POA: Diagnosis not present

## 2022-04-04 DIAGNOSIS — Z510 Encounter for antineoplastic radiation therapy: Secondary | ICD-10-CM | POA: Diagnosis not present

## 2022-04-04 LAB — RAD ONC ARIA SESSION SUMMARY
Course Elapsed Days: 4
Plan Fractions Treated to Date: 3
Plan Prescribed Dose Per Fraction: 2.67 Gy
Plan Total Fractions Prescribed: 15
Plan Total Prescribed Dose: 40.05 Gy
Reference Point Dosage Given to Date: 8.01 Gy
Reference Point Session Dosage Given: 2.67 Gy
Session Number: 3

## 2022-04-04 MED ORDER — ALRA NON-METALLIC DEODORANT (RAD-ONC)
1.0000 | Freq: Once | TOPICAL | Status: AC
  Administered 2022-04-04: 1 via TOPICAL

## 2022-04-04 MED ORDER — RADIAPLEXRX EX GEL: Freq: Once | CUTANEOUS | Status: AC

## 2022-04-04 NOTE — Progress Notes (Signed)

## 2022-04-05 ENCOUNTER — Other Ambulatory Visit: Payer: Self-pay

## 2022-04-05 ENCOUNTER — Ambulatory Visit
Admission: RE | Admit: 2022-04-05 | Discharge: 2022-04-05 | Disposition: A | Discharge status: Home / Self Care | Payer: Medicare Other | Source: Ambulatory Visit | Attending: Radiation Oncology | Admitting: Radiation Oncology

## 2022-04-05 DIAGNOSIS — Z170 Estrogen receptor positive status [ER+]: Secondary | ICD-10-CM | POA: Diagnosis not present

## 2022-04-05 DIAGNOSIS — Z510 Encounter for antineoplastic radiation therapy: Secondary | ICD-10-CM | POA: Diagnosis not present

## 2022-04-05 DIAGNOSIS — C50411 Malignant neoplasm of upper-outer quadrant of right female breast: Secondary | ICD-10-CM | POA: Diagnosis not present

## 2022-04-05 LAB — RAD ONC ARIA SESSION SUMMARY
Course Elapsed Days: 5
Plan Fractions Treated to Date: 4
Plan Prescribed Dose Per Fraction: 2.67 Gy
Plan Total Fractions Prescribed: 15
Plan Total Prescribed Dose: 40.05 Gy
Reference Point Dosage Given to Date: 10.68 Gy
Reference Point Session Dosage Given: 2.67 Gy
Session Number: 4

## 2022-04-05 NOTE — Therapy (Signed)
OUTPATIENT PHYSICAL THERAPY THORACOLUMBAR EVALUATION   Patient Name: Yolanda Morris MRN: [id number] DOB:[birth year], 76 y.o., female Today's Date: 04/06/2022   PT End of Session - 04/06/22 0800     Visit Number 1    Number of Visits 12    Date for PT Re-Evaluation 06/01/22    Authorization Type MCR    PT Start Time 0804    PT Stop Time 4562    PT Time Calculation (min) 43 min    Activity Tolerance Patient tolerated treatment well    Behavior During Therapy WFL for tasks assessed/performed             Past Medical History:  Diagnosis Date   Cancer ([place])    skin   COPD (chronic obstructive pulmonary disease) ([place])    DM (diabetes mellitus) ([place])    Dyspnea    increased activity    Family history of ovarian cancer 02/03/2022   Family history of stomach cancer 02/03/2022   Genital herpes    GERD (gastroesophageal reflux disease)    Hyperlipidemia    Migraines    Osteoporosis    Unspecified essential hypertension    Past Surgical History:  Procedure Laterality Date   BREAST BIOPSY     BREAST EXCISIONAL BIOPSY     over 20 years ago- benign   BREAST LUMPECTOMY WITH RADIOACTIVE SEED AND SENTINEL LYMPH NODE BIOPSY Right 02/16/2022   Procedure: RIGHT BREAST LUMPECTOMY WITH RADIOACTIVE SEED AND SENTINEL LYMPH NODE BIOPSY;  Surgeon: [name], MD;  Location: [facility];  Service: General;  Laterality: Right;   BUNIONECTOMY Left 2004   and hammer toe repair Left foot   COLON SURGERY     removal of polyps   [place]     Patient Active Problem List   Diagnosis Date Noted   Genetic testing 02/09/2022   Family history of ovarian cancer 02/03/2022   Family history of stomach cancer 02/03/2022   Malignant neoplasm of upper-outer quadrant of right breast in female, estrogen receptor positive ([place]) 01/28/2022   Medication management 07/21/2020   Healthcare maintenance 07/21/2020   Body aches 07/22/2019   Chronic respiratory  failure with hypoxia ([place]) 06/17/2019   Allergic rhinitis 11/13/2018   Shortness of breath 10/23/2018   COPD exacerbation ([place]) 01/07/2015   Cough 07/05/2013   Acute sinusitis 02/12/2013   Acute bronchitis 07/08/2011   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) 01/05/2011    PCP: [name], MD  REFERRING PROVIDER: [name], MD   REFERRING DIAG: C50.411,Z17.0 (ICD-10-CM) - Malignant neoplasm of upper-outer quadrant of right breast in female, estrogen receptor positive ([place]); Increased back pain - recent lumpectomy and osteoporosis  Rationale for Evaluation and Treatment Rehabilitation  THERAPY DIAG:  Cervicalgia  Pain in thoracic spine  Abnormal posture  Muscle weakness (generalized)  Cramp and spasm  ONSET DATE: 02/16/22  SUBJECTIVE:  SUBJECTIVE STATEMENT: Usually feels pain across T7 which is the site of an old compression fracture. Pain now is more in upper shoulders. She uses the heating pad but can't do now due to radiation. Started radiation last Thursday. Yesterday she reached up to rest her arm on the back of the couch and had sharp pain down the right arm to elbow. She is also getting nerve pain across her breasts which she is expecting from radiation. Reports some left hip pain also. Patient reports she is noticing that she is tripping more often as well.   PERTINENT HISTORY:  Patient was diagnosed on 12/27/2021 with right grade 2 invasive ductal carcinoma breast cancer.   Post Rt breast lumpectomy with no carcinoma in 4 removed nodes on 02/16/22. Started radiation therapy 03/31/22. It's Monday-Thursday for 4 weeks. Last session scheduled 04/28/22. OP, h/o vertigo, stage 4 COPD and is on 2 liters of oxygen when she is walking. Brings O2 with her.    PAIN:  Are you having pain?  Yes: NPRS scale: 1-2 up to 4-5/10 Pain location: across upper back/shoulders Pain description: achy Aggravating factors: radiation bed/hard surfaces; bending forward Relieving factors: heat, advil    PRECAUTIONS: Other: RECENT R LUMPECTOMY 02/16/22  WEIGHT BEARING RESTRICTIONS No  FALLS:  Has patient fallen in last 6 months? No  LIVING ENVIRONMENT: Lives with: lives alone Lives in: House/apartment Stairs: No Has following equipment at home: None  OCCUPATION: retired  PLOF: Independent  PATIENT GOALS  Wants to move more, decrease pain   OBJECTIVE:   DIAGNOSTIC FINDINGS:  Bone Density: Site Region Measured Date Measured Age YA BMD Significant CHANGE T-score AP Spine L1-L4 10/21/2021 75.4 -3.2 0.794 g/cm2  DualFemur Total Left 10/21/2021 75.4 -2.0 0.752 g/cm2  DualFemur Total Mean 10/21/2021 75.4 -2.0 0.761 g/cm2  Left Forearm Radius 33% 10/21/2021 75.4 -3.5 0.567 g/cm2  The BMD measured at Forearm Radius 33% is 0.567 g/cm2 with a T-score of -3.5. This patient is considered osteoporotic according to [facility]) criteria.  PATIENT SURVEYS:  NDI 14/50    MUSCLE LENGTH:   POSTURE: rounded shoulders, forward head, increased thoracic kyphosis, and tight left QL creating R convex lumbar scoliosis  PALPATION: Sore in subocciptals, bil UT along upper and mid thoracic paraspinals  LUMBAR ROM:   Active  A/PROM  eval  Flexion WFL, Limited by HS  Extension WFL  Right lateral flexion Limited by tight left QL  Left lateral flexion WNL  Right rotation 50%  Left rotation 50%   (Blank rows = not tested)  THORACIC ROTATION:  limited 75% Left and R 50% limited  CERVICAL ROM:  Active  A/PROM  eval  Flexion WNL  Extension WNL  Right lateral flexion 50%  Left lateral flexion 50%  Right rotation 50%  Left rotation 50%    LOWER EXTREMITY ROM:     WFL for exam  LOWER EXTREMITY MMT:    MMT Right eval Left eval  Hip flexion 4 4  Hip  extension    Hip abduction 4+ 4+  Hip adduction 4 4  Hip internal rotation    Hip external rotation    Knee flexion 4+ 4  Knee extension 4+ 4+  Ankle dorsiflexion 5 5  Ankle plantarflexion    Ankle inversion    Ankle eversion     (Blank rows = not tested)  UPPER EXTREMITY STRENGTH: WNL   TODAY'S TREATMENT  See HEP   PATIENT EDUCATION:  Education details: HEP, POC discussed Person educated: Patient  Education method: Explanation, Demonstration, Verbal cues, and Handouts Education comprehension: verbalized understanding and returned demonstration   HOME EXERCISE PROGRAM: Access Code: [email or web address] URL: [email or web address] Date: 04/06/2022 Prepared by: Almyra Free  Exercises - Supine Bridge  - 2 x daily - 7 x weekly - 1-3 sets - 10 reps - Supine Chest Stretch with Elbows Bent  - 2 x daily - 7 x weekly - 1 sets - 3 reps - 30-60 sec hold - Ulnar Nerve Glide- Full Arm  - 1 x daily - 7 x weekly - 1 sets - 10 reps - 5 sec hold - Standing Gastroc Stretch at Counter  - 2 x daily - 7 x weekly - 1 sets - 3 reps - 30 sec hold  ASSESSMENT:  CLINICAL IMPRESSION: TYYNE CLIETT is a 76 y.o. female who was seen today for physical therapy evaluation and treatment for thoracic and cervical pain. She has osteoporosis and has chronic pain in the T7 region due to a compression fracture. Her pain is now more in the post shoulder and UT region and affects her ability to perform her normal ADLs, especially those requiring forward flexion as well as limits her ability to exercise for her overall health. Zakyia was diagnosed on 12/27/2021 with right grade 2 invasive ductal carcinoma breast cancer.  Post Rt breast lumpectomy with no carcinoma in 4 removed nodes on 02/16/22. Started radiation therapy 03/31/22.  She also c/o left hip pain and reports she is experiencing increased incidences of tripping. She has limitations in cervical, thoracic and lumbar ROM and pain with palpation in the neck and  thoracic paraspinals. She has significant thoracic kyphosis and also has a tight left QL affecting normal posture. Additionally she has significant weakness in BLE. She will benefit from skilled PT to address these deficits. Ferrah would also benefit from further asessment of her balance to prevent falls.   OBJECTIVE IMPAIRMENTS decreased ROM, decreased strength, increased muscle spasms, impaired flexibility, postural dysfunction, and pain.   ACTIVITY LIMITATIONS carrying, lifting, and bending  PARTICIPATION LIMITATIONS:  pain affects all ADLS  PERSONAL FACTORS 3+ comorbidities: Breast Cancer and current radiation tx, OP, h/o vertigo, stage 4 COPD   are also affecting patient's functional outcome.   REHAB POTENTIAL: Good  CLINICAL DECISION MAKING: Evolving/moderate complexity  EVALUATION COMPLEXITY: Low   GOALS: Goals reviewed with patient? Yes  SHORT TERM GOALS: Target date: 04/20/2022  Ind with initial HEP Baseline: Goal status: INITIAL  2.  Balance assessed and goals set Baseline:  Goal status: INITIAL  LONG TERM GOALS: Target date: 06/01/2022  Decreased pain by 75% or more to improve pt's QOL. Baseline:  Goal status: INITIAL  2. Improved cervical rotation to [facility] to ease ADLS. Baseline:  Goal status: INITIAL  3.  Improved tolerance to exercise and/or cardiovascular activity by 50% or more to improve health outcomes.  Baseline:  Goal status: INITIAL  4.  Improved LE strength to 4+/5 or better to allow for improved body mechanics with ADLs. Baseline:  Goal status: INITIAL  5.  Improved NDI to <= 5 demonstrating decreased disability Baseline: 14 Goal status: INITIAL   PLAN: PT FREQUENCY: 2x/week  PT DURATION: 8 weeks  PLANNED INTERVENTIONS: Therapeutic exercises, Therapeutic activity, Neuromuscular re-education, Balance training, Gait training, Patient/Family education, Joint mobilization, Aquatic Therapy, Dry Needling, Spinal mobilization, Cryotherapy,  Taping, and Manual therapy.  PLAN FOR NEXT SESSION: complete balance assessment and set goals if indicated, general postural and LE strengthening avoiding flexion due to OP, manual therapy to upper back /  neck. Review and progress HEP.   Dynasty Holquin, PT 04/06/2022, 2:32 PM

## 2022-04-06 ENCOUNTER — Encounter: Payer: Self-pay | Admitting: Physical Therapy

## 2022-04-06 ENCOUNTER — Ambulatory Visit
Admission: RE | Admit: 2022-04-06 | Discharge: 2022-04-06 | Disposition: A | Discharge status: Home / Self Care | Payer: Medicare Other | Source: Ambulatory Visit | Attending: Radiation Oncology | Admitting: Radiation Oncology

## 2022-04-06 ENCOUNTER — Other Ambulatory Visit: Payer: Self-pay

## 2022-04-06 ENCOUNTER — Ambulatory Visit: Payer: Medicare Other | Attending: Hematology and Oncology | Admitting: Physical Therapy

## 2022-04-06 DIAGNOSIS — R293 Abnormal posture: Secondary | ICD-10-CM | POA: Insufficient documentation

## 2022-04-06 DIAGNOSIS — R252 Cramp and spasm: Secondary | ICD-10-CM | POA: Diagnosis not present

## 2022-04-06 DIAGNOSIS — M542 Cervicalgia: Secondary | ICD-10-CM | POA: Diagnosis not present

## 2022-04-06 DIAGNOSIS — M546 Pain in thoracic spine: Secondary | ICD-10-CM | POA: Diagnosis not present

## 2022-04-06 DIAGNOSIS — M5459 Other low back pain: Secondary | ICD-10-CM | POA: Diagnosis not present

## 2022-04-06 DIAGNOSIS — C50411 Malignant neoplasm of upper-outer quadrant of right female breast: Secondary | ICD-10-CM | POA: Diagnosis not present

## 2022-04-06 DIAGNOSIS — Z170 Estrogen receptor positive status [ER+]: Secondary | ICD-10-CM | POA: Insufficient documentation

## 2022-04-06 DIAGNOSIS — M6281 Muscle weakness (generalized): Secondary | ICD-10-CM | POA: Diagnosis not present

## 2022-04-06 DIAGNOSIS — Z510 Encounter for antineoplastic radiation therapy: Secondary | ICD-10-CM | POA: Diagnosis not present

## 2022-04-06 LAB — RAD ONC ARIA SESSION SUMMARY
Course Elapsed Days: 6
Plan Fractions Treated to Date: 5
Plan Prescribed Dose Per Fraction: 2.67 Gy
Plan Total Fractions Prescribed: 15
Plan Total Prescribed Dose: 40.05 Gy
Reference Point Dosage Given to Date: 13.35 Gy
Reference Point Session Dosage Given: 2.67 Gy
Session Number: 5

## 2022-04-07 ENCOUNTER — Other Ambulatory Visit: Payer: Self-pay

## 2022-04-07 ENCOUNTER — Ambulatory Visit
Admission: RE | Admit: 2022-04-07 | Discharge: 2022-04-07 | Disposition: A | Discharge status: Home / Self Care | Payer: Medicare Other | Source: Ambulatory Visit | Attending: Radiation Oncology | Admitting: Radiation Oncology

## 2022-04-07 DIAGNOSIS — Z170 Estrogen receptor positive status [ER+]: Secondary | ICD-10-CM | POA: Diagnosis not present

## 2022-04-07 DIAGNOSIS — Z510 Encounter for antineoplastic radiation therapy: Secondary | ICD-10-CM | POA: Diagnosis not present

## 2022-04-07 DIAGNOSIS — C50411 Malignant neoplasm of upper-outer quadrant of right female breast: Secondary | ICD-10-CM | POA: Diagnosis not present

## 2022-04-07 LAB — RAD ONC ARIA SESSION SUMMARY
Course Elapsed Days: 7
Plan Fractions Treated to Date: 6
Plan Prescribed Dose Per Fraction: 2.67 Gy
Plan Total Fractions Prescribed: 15
Plan Total Prescribed Dose: 40.05 Gy
Reference Point Dosage Given to Date: 16.02 Gy
Reference Point Session Dosage Given: 2.67 Gy
Session Number: 6

## 2022-04-08 ENCOUNTER — Ambulatory Visit
Admission: RE | Admit: 2022-04-08 | Discharge: 2022-04-08 | Disposition: A | Discharge status: Home / Self Care | Payer: Medicare Other | Source: Ambulatory Visit | Attending: Radiation Oncology | Admitting: Radiation Oncology

## 2022-04-08 ENCOUNTER — Ambulatory Visit: Payer: Medicare Other

## 2022-04-08 ENCOUNTER — Other Ambulatory Visit: Payer: Self-pay

## 2022-04-08 DIAGNOSIS — M546 Pain in thoracic spine: Secondary | ICD-10-CM

## 2022-04-08 DIAGNOSIS — M5459 Other low back pain: Secondary | ICD-10-CM | POA: Diagnosis not present

## 2022-04-08 DIAGNOSIS — M6281 Muscle weakness (generalized): Secondary | ICD-10-CM | POA: Diagnosis not present

## 2022-04-08 DIAGNOSIS — Z170 Estrogen receptor positive status [ER+]: Secondary | ICD-10-CM | POA: Diagnosis not present

## 2022-04-08 DIAGNOSIS — R293 Abnormal posture: Secondary | ICD-10-CM | POA: Diagnosis not present

## 2022-04-08 DIAGNOSIS — M542 Cervicalgia: Secondary | ICD-10-CM | POA: Diagnosis not present

## 2022-04-08 DIAGNOSIS — R252 Cramp and spasm: Secondary | ICD-10-CM

## 2022-04-08 DIAGNOSIS — C50411 Malignant neoplasm of upper-outer quadrant of right female breast: Secondary | ICD-10-CM | POA: Diagnosis not present

## 2022-04-08 DIAGNOSIS — Z510 Encounter for antineoplastic radiation therapy: Secondary | ICD-10-CM | POA: Diagnosis not present

## 2022-04-08 LAB — RAD ONC ARIA SESSION SUMMARY
Course Elapsed Days: 8
Plan Fractions Treated to Date: 7
Plan Prescribed Dose Per Fraction: 2.67 Gy
Plan Total Fractions Prescribed: 15
Plan Total Prescribed Dose: 40.05 Gy
Reference Point Dosage Given to Date: 18.69 Gy
Reference Point Session Dosage Given: 2.67 Gy
Session Number: 7

## 2022-04-11 ENCOUNTER — Ambulatory Visit
Admission: RE | Admit: 2022-04-11 | Discharge: 2022-04-11 | Disposition: A | Discharge status: Home / Self Care | Payer: Medicare Other | Source: Ambulatory Visit | Attending: Radiation Oncology | Admitting: Radiation Oncology

## 2022-04-11 ENCOUNTER — Other Ambulatory Visit: Payer: Self-pay

## 2022-04-11 DIAGNOSIS — Z170 Estrogen receptor positive status [ER+]: Secondary | ICD-10-CM | POA: Diagnosis not present

## 2022-04-11 DIAGNOSIS — C50411 Malignant neoplasm of upper-outer quadrant of right female breast: Secondary | ICD-10-CM | POA: Diagnosis not present

## 2022-04-11 DIAGNOSIS — Z510 Encounter for antineoplastic radiation therapy: Secondary | ICD-10-CM | POA: Diagnosis not present

## 2022-04-11 LAB — RAD ONC ARIA SESSION SUMMARY
Course Elapsed Days: 11
Plan Fractions Treated to Date: 8
Plan Prescribed Dose Per Fraction: 2.67 Gy
Plan Total Fractions Prescribed: 15
Plan Total Prescribed Dose: 40.05 Gy
Reference Point Dosage Given to Date: 21.36 Gy
Reference Point Session Dosage Given: 2.67 Gy
Session Number: 8

## 2022-04-12 ENCOUNTER — Other Ambulatory Visit: Payer: Self-pay

## 2022-04-12 ENCOUNTER — Ambulatory Visit
Admission: RE | Admit: 2022-04-12 | Discharge: 2022-04-12 | Disposition: A | Discharge status: Home / Self Care | Payer: Medicare Other | Source: Ambulatory Visit | Attending: Radiation Oncology | Admitting: Radiation Oncology

## 2022-04-12 DIAGNOSIS — Z170 Estrogen receptor positive status [ER+]: Secondary | ICD-10-CM | POA: Diagnosis not present

## 2022-04-12 DIAGNOSIS — Z510 Encounter for antineoplastic radiation therapy: Secondary | ICD-10-CM | POA: Diagnosis not present

## 2022-04-12 DIAGNOSIS — C50411 Malignant neoplasm of upper-outer quadrant of right female breast: Secondary | ICD-10-CM | POA: Diagnosis not present

## 2022-04-12 LAB — RAD ONC ARIA SESSION SUMMARY
Course Elapsed Days: 12
Plan Fractions Treated to Date: 9
Plan Prescribed Dose Per Fraction: 2.67 Gy
Plan Total Fractions Prescribed: 15
Plan Total Prescribed Dose: 40.05 Gy
Reference Point Dosage Given to Date: 24.03 Gy
Reference Point Session Dosage Given: 2.67 Gy
Session Number: 9

## 2022-04-13 ENCOUNTER — Other Ambulatory Visit: Payer: Self-pay

## 2022-04-13 ENCOUNTER — Ambulatory Visit
Admission: RE | Admit: 2022-04-13 | Discharge: 2022-04-13 | Disposition: A | Discharge status: Home / Self Care | Payer: Medicare Other | Source: Ambulatory Visit | Attending: Radiation Oncology | Admitting: Radiation Oncology

## 2022-04-13 ENCOUNTER — Ambulatory Visit: Payer: Medicare Other | Admitting: Physical Therapy

## 2022-04-13 ENCOUNTER — Encounter: Payer: Self-pay | Admitting: Physical Therapy

## 2022-04-13 DIAGNOSIS — Z510 Encounter for antineoplastic radiation therapy: Secondary | ICD-10-CM | POA: Diagnosis not present

## 2022-04-13 DIAGNOSIS — M542 Cervicalgia: Secondary | ICD-10-CM

## 2022-04-13 DIAGNOSIS — M6281 Muscle weakness (generalized): Secondary | ICD-10-CM

## 2022-04-13 DIAGNOSIS — M5459 Other low back pain: Secondary | ICD-10-CM | POA: Diagnosis not present

## 2022-04-13 DIAGNOSIS — C50411 Malignant neoplasm of upper-outer quadrant of right female breast: Secondary | ICD-10-CM

## 2022-04-13 DIAGNOSIS — R293 Abnormal posture: Secondary | ICD-10-CM

## 2022-04-13 DIAGNOSIS — M546 Pain in thoracic spine: Secondary | ICD-10-CM

## 2022-04-13 DIAGNOSIS — R252 Cramp and spasm: Secondary | ICD-10-CM

## 2022-04-13 DIAGNOSIS — Z170 Estrogen receptor positive status [ER+]: Secondary | ICD-10-CM | POA: Diagnosis not present

## 2022-04-13 LAB — RAD ONC ARIA SESSION SUMMARY
Course Elapsed Days: 13
Plan Fractions Treated to Date: 10
Plan Prescribed Dose Per Fraction: 2.67 Gy
Plan Total Fractions Prescribed: 15
Plan Total Prescribed Dose: 40.05 Gy
Reference Point Dosage Given to Date: 26.7 Gy
Reference Point Session Dosage Given: 2.67 Gy
Session Number: 10

## 2022-04-13 MED ORDER — RADIAPLEXRX EX GEL: Freq: Once | CUTANEOUS | Status: AC

## 2022-04-13 NOTE — Therapy (Signed)
OUTPATIENT PHYSICAL THERAPY TREATMENT   Patient Name: Yolanda Morris MRN: [id number] DOB:09/07/1946, 76 y.o., female Today's Date: 04/13/2022   PT End of Session - 04/13/22 1400     Visit Number 3    Number of Visits 12    Date for PT Re-Evaluation 06/01/22    Authorization Type MCR    PT Start Time 1400    PT Stop Time 7218    PT Time Calculation (min) 45 min    Activity Tolerance Patient tolerated treatment well    Behavior During Therapy WFL for tasks assessed/performed              Past Medical History:  Diagnosis Date   Cancer ([place])    skin   COPD (chronic obstructive pulmonary disease) ([place])    DM (diabetes mellitus) ([place])    Dyspnea    increased activity    Family history of ovarian cancer 02/03/2022   Family history of stomach cancer 02/03/2022   Genital herpes    GERD (gastroesophageal reflux disease)    Hyperlipidemia    Migraines    Osteoporosis    Unspecified essential hypertension    Past Surgical History:  Procedure Laterality Date   BREAST BIOPSY     BREAST EXCISIONAL BIOPSY     over 20 years ago- benign   BREAST LUMPECTOMY WITH RADIOACTIVE SEED AND SENTINEL LYMPH NODE BIOPSY Right 02/16/2022   Procedure: RIGHT BREAST LUMPECTOMY WITH RADIOACTIVE SEED AND SENTINEL LYMPH NODE BIOPSY;  Surgeon: [name], MD;  Location: [facility];  Service: General;  Laterality: Right;   BUNIONECTOMY Left 2004   and hammer toe repair Left foot   COLON SURGERY     removal of polyps   [place]     Patient Active Problem List   Diagnosis Date Noted   Genetic testing 02/09/2022   Family history of ovarian cancer 02/03/2022   Family history of stomach cancer 02/03/2022   Malignant neoplasm of upper-outer quadrant of right breast in female, estrogen receptor positive ([place]) 01/28/2022   Medication management 07/21/2020   Healthcare maintenance 07/21/2020   Body aches 07/22/2019   Chronic respiratory failure with  hypoxia ([place]) 06/17/2019   Allergic rhinitis 11/13/2018   Shortness of breath 10/23/2018   COPD exacerbation ([place]) 01/07/2015   Cough 07/05/2013   Acute sinusitis 02/12/2013   Acute bronchitis 07/08/2011   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) 01/05/2011    PCP: [name], MD  REFERRING PROVIDER: [name], MD   REFERRING DIAG: C50.411,Z17.0 (ICD-10-CM) - Malignant neoplasm of upper-outer quadrant of right breast in female, estrogen receptor positive ([place]); Increased back pain - recent lumpectomy and osteoporosis  Rationale for Evaluation and Treatment Rehabilitation  THERAPY DIAG:  Pain in thoracic spine  Cervicalgia  Abnormal posture  Muscle weakness (generalized)  Cramp and spasm  Other low back pain  ONSET DATE: 02/16/22  SUBJECTIVE:  SUBJECTIVE STATEMENT: Pt reports still having a lot of pain across her shoulders. Had terrible migraine over weekend.   PERTINENT HISTORY:  Patient was diagnosed on 12/27/2021 with right grade 2 invasive ductal carcinoma breast cancer.   Post Rt breast lumpectomy with no carcinoma in 4 removed nodes on 02/16/22. Started radiation therapy 03/31/22. It's Monday-Thursday for 4 weeks. Last session scheduled 04/28/22. OP, h/o vertigo, stage 4 COPD and is on 2 liters of oxygen when she is walking. Brings O2 with her.  Compression fracture T7  PAIN:  Are you having pain? Yes: NPRS scale: 3/10 Pain location: across upper back/shoulders Pain description: achy Aggravating factors: radiation bed/hard surfaces; bending forward Relieving factors: heat, advil    PRECAUTIONS: Other: RECENT R LUMPECTOMY 02/16/22  WEIGHT BEARING RESTRICTIONS No  FALLS:  Has patient fallen in last 6 months? No  LIVING ENVIRONMENT: Lives with: lives alone Lives in:  House/apartment Stairs: No Has following equipment at home: None  OCCUPATION: retired  PLOF: Independent  PATIENT GOALS  Wants to move more, decrease pain   OBJECTIVE:   DIAGNOSTIC FINDINGS:  Bone Density: Site Region Measured Date Measured Age YA BMD Significant CHANGE T-score AP Spine L1-L4 10/21/2021 75.4 -3.2 0.794 g/cm2  DualFemur Total Left 10/21/2021 75.4 -2.0 0.752 g/cm2  DualFemur Total Mean 10/21/2021 75.4 -2.0 0.761 g/cm2  Left Forearm Radius 33% 10/21/2021 75.4 -3.5 0.567 g/cm2  The BMD measured at Forearm Radius 33% is 0.567 g/cm2 with a T-score of -3.5. This patient is considered osteoporotic according to [facility]) criteria.  PATIENT SURVEYS:  NDI 14/50    MUSCLE LENGTH:   POSTURE: rounded shoulders, forward head, increased thoracic kyphosis, and tight left QL creating R convex lumbar scoliosis  PALPATION: Sore in subocciptals, bil UT along upper and mid thoracic paraspinals  LUMBAR ROM:   Active  A/PROM  eval  Flexion WFL, Limited by HS  Extension WFL  Right lateral flexion Limited by tight left QL  Left lateral flexion WNL  Right rotation 50%  Left rotation 50%   (Blank rows = not tested)  THORACIC ROTATION:  limited 75% Left and R 50% limited  CERVICAL ROM:  Active  A/PROM  eval  Flexion WNL  Extension WNL  Right lateral flexion 50%  Left lateral flexion 50%  Right rotation 50%  Left rotation 50%    LOWER EXTREMITY ROM:     WFL for exam  LOWER EXTREMITY MMT:    MMT Right eval Left eval  Hip flexion 4 4  Hip extension    Hip abduction 4+ 4+  Hip adduction 4 4  Hip internal rotation    Hip external rotation    Knee flexion 4+ 4  Knee extension 4+ 4+  Ankle dorsiflexion 5 5  Ankle plantarflexion    Ankle inversion    Ankle eversion     (Blank rows = not tested)  UPPER EXTREMITY STRENGTH: WNL   TODAY'S TREATMENT  04/13/22 Therapeutic Exercise: to improve strength and mobility.   Nustep L3 x 5  min   post O2 93%, HR 116  Standing open books for thoracic mobilization x 10 bil  Seated arm raises with weight shift for core/side stretch x 10 bil  Seated arm raise/marching for strengthening Prone on elbows - for thoracic stretch Manual Therapy: to decrease muscle spasm and pain and improve mobility In prone - STM to thoracic paraspinals, rhomboids, cervical paraspinals, bil UT, levator scapulae.  STM to L lats and posterior shoulder.  . 04/08/22 Therapeutic  Exercise: Nustep L1x19mn UT stretch x 30 sec bil LS stretch x 30 sec bil Assesed BERG and DGI BERG: 45/54 DGI: 17/24 04/06/22 See HEP   PATIENT EDUCATION:  Education details: HEP updated  Person educated: Patient Education method: Explanation, Demonstration, Verbal cues, and Handouts Education comprehension: verbalized understanding and returned demonstration   HOME EXERCISE PROGRAM: Access Code: [email or web address]  ASSESSMENT:  CLINICAL IMPRESSION: Yolanda SCHEYreports soreness across upper back and shoulders.  Verified that the new referral that was sent by PCP was to address same issue as referral originally sent by oncologist, so no need to reevaluate back or change POC.  Focused on exercises to improve thoracic and shoulder mobility, which she tolerated well with no dyspnea on exertion.  She had some shortness of breath following nustep but SpO2 was 93% after exercise (baseline SpO2 is 94%), so no additional oxygen supplementation needed.  She discussed DN with her radiologist, but due to risks she is not comfortable, reassured that we could still improve pain without dry needling and her comfort and safety is most important.  Instead focused on manual therapy to thoracic spine and L posterior shoulder, after which she reported decreased pain.  DNiel Hummercontinues to demonstrate potential for improvement and would benefit from continued skilled therapy to address impairments.     OBJECTIVE IMPAIRMENTS decreased ROM,  decreased strength, increased muscle spasms, impaired flexibility, postural dysfunction, and pain.   ACTIVITY LIMITATIONS carrying, lifting, and bending  PARTICIPATION LIMITATIONS:  pain affects all ADLS  PERSONAL FACTORS 3+ comorbidities: Breast Cancer and current radiation tx, OP, h/o vertigo, stage 4 COPD   are also affecting patient's functional outcome.   REHAB POTENTIAL: Good  CLINICAL DECISION MAKING: Evolving/moderate complexity  EVALUATION COMPLEXITY: Low   GOALS: Goals reviewed with patient? Yes  SHORT TERM GOALS: Target date: 04/20/2022  Ind with initial HEP Baseline: Goal status: IN PROGRESS  2.  Balance assessed and goals set Baseline:  Goal status: MET  LONG TERM GOALS: Target date: 06/01/2022  Decreased pain by 75% or more to improve pt's QOL. Baseline:  Goal status: IN PROGRESS  2. Improved cervical rotation to [facility] ease ADLS. Baseline:  Goal status: IN PROGRESS  3.  Improved tolerance to exercise and/or cardiovascular activity by 50% or more to improve health outcomes.  Baseline:  Goal status: IN PROGRESS  4.  Improved LE strength to 4+/5 or better to allow for improved body mechanics with ADLs. Baseline:  Goal status: IN PROGRESS  5.  Improved NDI to <= 5 demonstrating decreased disability Baseline: 14 Goal status: IN PROGRESS   PLAN: PT FREQUENCY: 2x/week  PT DURATION: 8 weeks  PLANNED INTERVENTIONS: Therapeutic exercises, Therapeutic activity, Neuromuscular re-education, Balance training, Gait training, Patient/Family education, Joint mobilization, Aquatic Therapy, Dry Needling, Spinal mobilization, Cryotherapy, Taping, and Manual therapy.  PLAN FOR NEXT SESSION:  general postural and LE strengthening avoiding flexion due to OP, manual therapy to upper back /neck, balance exercise, monitor for vertigo   ERennie Natter PT, DPT  04/13/2022, 4:41 PM

## 2022-04-14 ENCOUNTER — Ambulatory Visit: Payer: Medicare Other | Attending: Internal Medicine

## 2022-04-14 ENCOUNTER — Other Ambulatory Visit: Payer: Self-pay

## 2022-04-14 ENCOUNTER — Ambulatory Visit
Admission: RE | Admit: 2022-04-14 | Discharge: 2022-04-14 | Disposition: A | Discharge status: Home / Self Care | Payer: Medicare Other | Source: Ambulatory Visit | Attending: Radiation Oncology | Admitting: Radiation Oncology

## 2022-04-14 DIAGNOSIS — Z170 Estrogen receptor positive status [ER+]: Secondary | ICD-10-CM | POA: Diagnosis not present

## 2022-04-14 DIAGNOSIS — Z510 Encounter for antineoplastic radiation therapy: Secondary | ICD-10-CM | POA: Diagnosis not present

## 2022-04-14 DIAGNOSIS — C50411 Malignant neoplasm of upper-outer quadrant of right female breast: Secondary | ICD-10-CM | POA: Diagnosis not present

## 2022-04-14 DIAGNOSIS — Z23 Encounter for immunization: Secondary | ICD-10-CM

## 2022-04-14 LAB — RAD ONC ARIA SESSION SUMMARY
Course Elapsed Days: 14
Plan Fractions Treated to Date: 11
Plan Prescribed Dose Per Fraction: 2.67 Gy
Plan Total Fractions Prescribed: 15
Plan Total Prescribed Dose: 40.05 Gy
Reference Point Dosage Given to Date: 29.37 Gy
Reference Point Session Dosage Given: 2.67 Gy
Session Number: 11

## 2022-04-14 NOTE — Progress Notes (Signed)
   Covid-19 Vaccination Clinic  Name:  Yolanda Morris    MRN: [id number] DOB: [birth year]  04/14/2022  Yolanda Morris was observed post Covid-19 immunization for 15 minutes without incident. She was provided with Vaccine Information Sheet and instruction to access the V-Safe system.   Yolanda Morris was instructed to call 911 with any severe reactions post vaccine: Difficulty breathing  Swelling of face and throat  A fast heartbeat  A bad rash all over body  Dizziness and weakness   Immunizations Administered     Name Date Dose VIS Date Route   Pfizer Covid-19 Vaccine Bivalent Booster 04/14/2022 11:28 AM 0.3 mL 06/16/2021 Intramuscular   Manufacturer: Tat [employer]oli   Lot: [id number]   [place]: [phone]

## 2022-04-15 ENCOUNTER — Other Ambulatory Visit: Payer: Self-pay

## 2022-04-15 ENCOUNTER — Ambulatory Visit
Admission: RE | Admit: 2022-04-15 | Discharge: 2022-04-15 | Disposition: A | Discharge status: Home / Self Care | Payer: Medicare Other | Source: Ambulatory Visit | Attending: Radiation Oncology | Admitting: Radiation Oncology

## 2022-04-15 DIAGNOSIS — Z170 Estrogen receptor positive status [ER+]: Secondary | ICD-10-CM | POA: Diagnosis not present

## 2022-04-15 DIAGNOSIS — E119 Type 2 diabetes mellitus without complications: Secondary | ICD-10-CM | POA: Diagnosis not present

## 2022-04-15 DIAGNOSIS — Z510 Encounter for antineoplastic radiation therapy: Secondary | ICD-10-CM | POA: Diagnosis not present

## 2022-04-15 DIAGNOSIS — C50411 Malignant neoplasm of upper-outer quadrant of right female breast: Secondary | ICD-10-CM | POA: Diagnosis not present

## 2022-04-15 LAB — RAD ONC ARIA SESSION SUMMARY
Course Elapsed Days: 15
Plan Fractions Treated to Date: 12
Plan Prescribed Dose Per Fraction: 2.67 Gy
Plan Total Fractions Prescribed: 15
Plan Total Prescribed Dose: 40.05 Gy
Reference Point Dosage Given to Date: 32.04 Gy
Reference Point Session Dosage Given: 2.67 Gy
Session Number: 12

## 2022-04-18 ENCOUNTER — Other Ambulatory Visit: Payer: Self-pay

## 2022-04-18 ENCOUNTER — Ambulatory Visit: Payer: Medicare Other

## 2022-04-18 ENCOUNTER — Other Ambulatory Visit (HOSPITAL_BASED_OUTPATIENT_CLINIC_OR_DEPARTMENT_OTHER): Payer: Self-pay

## 2022-04-18 ENCOUNTER — Ambulatory Visit
Admission: RE | Admit: 2022-04-18 | Discharge: 2022-04-18 | Disposition: A | Discharge status: Home / Self Care | Payer: Medicare Other | Source: Ambulatory Visit | Attending: Radiation Oncology | Admitting: Radiation Oncology

## 2022-04-18 ENCOUNTER — Ambulatory Visit: Payer: Medicare Other | Admitting: Radiation Oncology

## 2022-04-18 DIAGNOSIS — Z170 Estrogen receptor positive status [ER+]: Secondary | ICD-10-CM | POA: Insufficient documentation

## 2022-04-18 DIAGNOSIS — Z510 Encounter for antineoplastic radiation therapy: Secondary | ICD-10-CM | POA: Diagnosis not present

## 2022-04-18 DIAGNOSIS — C50411 Malignant neoplasm of upper-outer quadrant of right female breast: Secondary | ICD-10-CM | POA: Insufficient documentation

## 2022-04-18 DIAGNOSIS — Z23 Encounter for immunization: Secondary | ICD-10-CM | POA: Diagnosis not present

## 2022-04-18 LAB — RAD ONC ARIA SESSION SUMMARY
Course Elapsed Days: 18
Plan Fractions Treated to Date: 13
Plan Prescribed Dose Per Fraction: 2.67 Gy
Plan Total Fractions Prescribed: 15
Plan Total Prescribed Dose: 40.05 Gy
Reference Point Dosage Given to Date: 34.71 Gy
Reference Point Session Dosage Given: 2.67 Gy
Session Number: 13

## 2022-04-18 MED ORDER — PFIZER COVID-19 VAC BIVALENT 30 MCG/0.3ML IM SUSP
INTRAMUSCULAR | 0 refills | Status: DC
Start: 2022-04-14 — End: 2023-12-04
  Filled 2022-04-18: qty 0.3, 1d supply, fill #0

## 2022-04-20 ENCOUNTER — Ambulatory Visit
Admission: RE | Admit: 2022-04-20 | Discharge: 2022-04-20 | Disposition: A | Discharge status: Home / Self Care | Payer: Medicare Other | Source: Ambulatory Visit | Attending: Radiation Oncology | Admitting: Radiation Oncology

## 2022-04-20 ENCOUNTER — Other Ambulatory Visit: Payer: Self-pay

## 2022-04-20 DIAGNOSIS — Z170 Estrogen receptor positive status [ER+]: Secondary | ICD-10-CM | POA: Diagnosis not present

## 2022-04-20 DIAGNOSIS — C50411 Malignant neoplasm of upper-outer quadrant of right female breast: Secondary | ICD-10-CM | POA: Diagnosis not present

## 2022-04-20 DIAGNOSIS — Z510 Encounter for antineoplastic radiation therapy: Secondary | ICD-10-CM | POA: Diagnosis not present

## 2022-04-20 LAB — RAD ONC ARIA SESSION SUMMARY
Course Elapsed Days: 20
Plan Fractions Treated to Date: 14
Plan Prescribed Dose Per Fraction: 2.67 Gy
Plan Total Fractions Prescribed: 15
Plan Total Prescribed Dose: 40.05 Gy
Reference Point Dosage Given to Date: 37.38 Gy
Reference Point Session Dosage Given: 2.67 Gy
Session Number: 14

## 2022-04-21 ENCOUNTER — Ambulatory Visit: Payer: Medicare Other | Attending: Hematology and Oncology | Admitting: Physical Therapy

## 2022-04-21 ENCOUNTER — Encounter: Payer: Self-pay | Admitting: Physical Therapy

## 2022-04-21 ENCOUNTER — Ambulatory Visit
Admission: RE | Admit: 2022-04-21 | Discharge: 2022-04-21 | Disposition: A | Discharge status: Home / Self Care | Payer: Medicare Other | Source: Ambulatory Visit | Attending: Radiation Oncology | Admitting: Radiation Oncology

## 2022-04-21 ENCOUNTER — Other Ambulatory Visit: Payer: Self-pay

## 2022-04-21 DIAGNOSIS — Z510 Encounter for antineoplastic radiation therapy: Secondary | ICD-10-CM | POA: Diagnosis not present

## 2022-04-21 DIAGNOSIS — C50411 Malignant neoplasm of upper-outer quadrant of right female breast: Secondary | ICD-10-CM | POA: Diagnosis not present

## 2022-04-21 DIAGNOSIS — M5459 Other low back pain: Secondary | ICD-10-CM | POA: Diagnosis not present

## 2022-04-21 DIAGNOSIS — Z170 Estrogen receptor positive status [ER+]: Secondary | ICD-10-CM | POA: Diagnosis not present

## 2022-04-21 DIAGNOSIS — M6281 Muscle weakness (generalized): Secondary | ICD-10-CM | POA: Insufficient documentation

## 2022-04-21 DIAGNOSIS — R252 Cramp and spasm: Secondary | ICD-10-CM | POA: Insufficient documentation

## 2022-04-21 DIAGNOSIS — M546 Pain in thoracic spine: Secondary | ICD-10-CM | POA: Diagnosis not present

## 2022-04-21 DIAGNOSIS — R293 Abnormal posture: Secondary | ICD-10-CM | POA: Insufficient documentation

## 2022-04-21 DIAGNOSIS — M542 Cervicalgia: Secondary | ICD-10-CM | POA: Diagnosis not present

## 2022-04-21 LAB — RAD ONC ARIA SESSION SUMMARY
Course Elapsed Days: 21
Plan Fractions Treated to Date: 15
Plan Prescribed Dose Per Fraction: 2.67 Gy
Plan Total Fractions Prescribed: 15
Plan Total Prescribed Dose: 40.05 Gy
Reference Point Dosage Given to Date: 40.05 Gy
Reference Point Session Dosage Given: 2.67 Gy
Session Number: 15

## 2022-04-21 NOTE — Therapy (Signed)
OUTPATIENT PHYSICAL THERAPY TREATMENT   Patient Name: Yolanda Morris MRN: [id number] DOB:11-Nov-1945, 76 y.o., female Today's Date: 04/21/2022   PT End of Session - 04/21/22 1447     Visit Number 4    Number of Visits 12    Date for PT Re-Evaluation 06/01/22    Authorization Type MCR    PT Start Time 1447    PT Stop Time 3143    PT Time Calculation (min) 43 min    Activity Tolerance Patient tolerated treatment well    Behavior During Therapy WFL for tasks assessed/performed              Past Medical History:  Diagnosis Date   Cancer ([place])    skin   COPD (chronic obstructive pulmonary disease) ([place])    DM (diabetes mellitus) ([place])    Dyspnea    increased activity    Family history of ovarian cancer 02/03/2022   Family history of stomach cancer 02/03/2022   Genital herpes    GERD (gastroesophageal reflux disease)    Hyperlipidemia    Migraines    Osteoporosis    Unspecified essential hypertension    Past Surgical History:  Procedure Laterality Date   BREAST BIOPSY     BREAST EXCISIONAL BIOPSY     over 20 years ago- benign   BREAST LUMPECTOMY WITH RADIOACTIVE SEED AND SENTINEL LYMPH NODE BIOPSY Right 02/16/2022   Procedure: RIGHT BREAST LUMPECTOMY WITH RADIOACTIVE SEED AND SENTINEL LYMPH NODE BIOPSY;  Surgeon: [name], MD;  Location: [facility];  Service: General;  Laterality: Right;   BUNIONECTOMY Left 2004   and hammer toe repair Left foot   COLON SURGERY     removal of polyps   [place]     Patient Active Problem List   Diagnosis Date Noted   Genetic testing 02/09/2022   Family history of ovarian cancer 02/03/2022   Family history of stomach cancer 02/03/2022   Malignant neoplasm of upper-outer quadrant of right breast in female, estrogen receptor positive ([place]) 01/28/2022   Medication management 07/21/2020   Healthcare maintenance 07/21/2020   Body aches 07/22/2019   Chronic respiratory failure with  hypoxia ([place]) 06/17/2019   Allergic rhinitis 11/13/2018   Shortness of breath 10/23/2018   COPD exacerbation ([place]) 01/07/2015   Cough 07/05/2013   Acute sinusitis 02/12/2013   Acute bronchitis 07/08/2011   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) 01/05/2011    PCP: [name], MD  REFERRING PROVIDER: [name], MD   REFERRING DIAG: C50.411,Z17.0 (ICD-10-CM) - Malignant neoplasm of upper-outer quadrant of right breast in female, estrogen receptor positive ([place]); Increased back pain - recent lumpectomy and osteoporosis  Rationale for Evaluation and Treatment Rehabilitation  THERAPY DIAG:  Pain in thoracic spine  Cervicalgia  Abnormal posture  Muscle weakness (generalized)  Cramp and spasm  Other low back pain  ONSET DATE: 02/16/22  SUBJECTIVE:  SUBJECTIVE STATEMENT: Pt. Reports getting boost with radiation starting tomorrow, also reported noticing more burn after radiation last 2 days.  She slept on her other shoulder wrong couple of nights ago so sore on that side (left)  too.  Her back feels much better after worked on last time, not as much pain laying down.   PERTINENT HISTORY:  Patient was diagnosed on 12/27/2021 with right grade 2 invasive ductal carcinoma breast cancer.   Post Rt breast lumpectomy with no carcinoma in 4 removed nodes on 02/16/22. Started radiation therapy 03/31/22. It's Monday-Thursday for 4 weeks. Last session scheduled 04/28/22. OP, h/o vertigo, stage 4 COPD and is on 2 liters of oxygen when she is walking. Brings O2 with her.  Compression fracture T7  PAIN:  Are you having pain? Yes: NPRS scale: 3/10 Pain location: L UT/shoulder Pain description: achy Aggravating factors: radiation bed/hard surfaces; bending forward Relieving factors: heat,  advil    PRECAUTIONS: Other: RECENT R LUMPECTOMY 02/16/22; undergoing radiation therapy.  Light resistance only.   WEIGHT BEARING RESTRICTIONS No  FALLS:  Has patient fallen in last 6 months? No  LIVING ENVIRONMENT: Lives with: lives alone Lives in: House/apartment Stairs: No Has following equipment at home: None  OCCUPATION: retired  PLOF: Independent  PATIENT GOALS  Wants to move more, decrease pain   OBJECTIVE:   DIAGNOSTIC FINDINGS:  Bone Density: Site Region Measured Date Measured Age YA BMD Significant CHANGE T-score AP Spine L1-L4 10/21/2021 75.4 -3.2 0.794 g/cm2  DualFemur Total Left 10/21/2021 75.4 -2.0 0.752 g/cm2  DualFemur Total Mean 10/21/2021 75.4 -2.0 0.761 g/cm2  Left Forearm Radius 33% 10/21/2021 75.4 -3.5 0.567 g/cm2  The BMD measured at Forearm Radius 33% is 0.567 g/cm2 with a T-score of -3.5. This patient is considered osteoporotic according to [facility]) criteria.  PATIENT SURVEYS:  NDI 14/50    MUSCLE LENGTH:   POSTURE: rounded shoulders, forward head, increased thoracic kyphosis, and tight left QL creating R convex lumbar scoliosis  PALPATION: Sore in subocciptals, bil UT along upper and mid thoracic paraspinals  LUMBAR ROM:   Active  A/PROM  eval  Flexion WFL, Limited by HS  Extension WFL  Right lateral flexion Limited by tight left QL  Left lateral flexion WNL  Right rotation 50%  Left rotation 50%   (Blank rows = not tested)  THORACIC ROTATION:  limited 75% Left and R 50% limited  CERVICAL ROM:  Active  A/PROM  eval  Flexion WNL  Extension WNL  Right lateral flexion 50%  Left lateral flexion 50%  Right rotation 50%  Left rotation 50%    LOWER EXTREMITY ROM:     WFL for exam  LOWER EXTREMITY MMT:    MMT Right eval Left eval  Hip flexion 4 4  Hip extension    Hip abduction 4+ 4+  Hip adduction 4 4  Hip internal rotation    Hip external rotation    Knee flexion 4+ 4  Knee extension 4+  4+  Ankle dorsiflexion 5 5  Ankle plantarflexion    Ankle inversion    Ankle eversion     (Blank rows = not tested)  UPPER EXTREMITY STRENGTH: WNL   TODAY'S TREATMENT  04/21/2022 Therapeutic Exercise: to improve strength and mobility.  Demo, verbal and tactile cues throughout for technique. Nustep L4 x 3 min, reduced to L2 x 3 min due to dyspnea on exertion Seated neck exercises Wall slides x 10 bil  Standing rows YTB 2 x 10 Scap  squeezes with YTB 2 x 10  Manual Therapy: to decrease muscle spasm and pain and improve mobility.  STM/TPR to cervical paraspanals, L UT, bil levator scapulae, rhomboids, grade 1-2 PA mobs thoracic spine.    04/13/22 Therapeutic Exercise: to improve strength and mobility.   Nustep L3 x 5 min   post O2 93%, HR 116  Standing open books for thoracic mobilization x 10 bil  Seated arm raises with weight shift for core/side stretch x 10 bil  Seated arm raise/marching for strengthening Prone on elbows - for thoracic stretch Manual Therapy: to decrease muscle spasm and pain and improve mobility In prone - STM to thoracic paraspinals, rhomboids, cervical paraspinals, bil UT, levator scapulae.  STM to L lats and posterior shoulder.  . 04/08/22 Therapeutic Exercise: Nustep L1x22mn UT stretch x 30 sec bil LS stretch x 30 sec bil Assesed BERG and DGI BERG: 45/54 DGI: 17/24 04/06/22 See HEP   PATIENT EDUCATION:  Education details: HEP updated  Person educated: Patient Education method: Explanation, Demonstration, Verbal cues, and Handouts Education comprehension: verbalized understanding and returned demonstration   HOME EXERCISE PROGRAM: Access Code: [email or web address]  ASSESSMENT:  CLINICAL IMPRESSION: DNOTNAMED CROUCHERreported increased neck/shoulder pain on L this session, but overall improvement in ability to lay on back for radiation treatments.  Reviewed gentle stretches and AROM exercises for neck pain, focusing on shortening the muscles "yummy side"  movements.  Also progressed exercises for shoulder ROM with wall slides and introduced light resistance for postural strengthening.  Reviewed precautions and signs of lymphedema with patient as well, only light resistance on that arm.  She reported decreased pain after interventions today.  DNiel Hummercontinues to demonstrate potential for improvement and would benefit from continued skilled therapy to address impairments.     OBJECTIVE IMPAIRMENTS decreased ROM, decreased strength, increased muscle spasms, impaired flexibility, postural dysfunction, and pain.   ACTIVITY LIMITATIONS carrying, lifting, and bending  PARTICIPATION LIMITATIONS:  pain affects all ADLS  PERSONAL FACTORS 3+ comorbidities: Breast Cancer and current radiation tx, OP, h/o vertigo, stage 4 COPD   are also affecting patient's functional outcome.   REHAB POTENTIAL: Good  CLINICAL DECISION MAKING: Evolving/moderate complexity  EVALUATION COMPLEXITY: Low   GOALS: Goals reviewed with patient? Yes  SHORT TERM GOALS: Target date: 04/20/2022  Ind with initial HEP Baseline: Goal status: MET  2.  Balance assessed and goals set Baseline:  Goal status: MET  LONG TERM GOALS: Target date: 06/01/2022  Decreased pain by 75% or more to improve pt's QOL. Baseline:  Goal status: IN PROGRESS  2. Improved cervical rotation to [facility] ADLS. Baseline:  Goal status: IN PROGRESS  3.  Improved tolerance to exercise and/or cardiovascular activity by 50% or more to improve health outcomes.  Baseline:  Goal status: IN PROGRESS  4.  Improved LE strength to 4+/5 or better to allow for improved body mechanics with ADLs. Baseline:  Goal status: IN PROGRESS  5.  Improved NDI to <= 5 demonstrating decreased disability Baseline: 14 Goal status: IN PROGRESS   PLAN: PT FREQUENCY: 2x/week  PT DURATION: 8 weeks  PLANNED INTERVENTIONS: Therapeutic exercises, Therapeutic activity, Neuromuscular re-education, Balance  training, Gait training, Patient/Family education, Joint mobilization, Aquatic Therapy, Dry Needling, Spinal mobilization, Cryotherapy, Taping, and Manual therapy.  PLAN FOR NEXT SESSION:  general postural and LE strengthening avoiding flexion due to OP, manual therapy to upper back /neck, balance exercise, monitor for vertigo   ERennie Natter PT, DPT  04/21/2022, 4:43 PM

## 2022-04-22 ENCOUNTER — Ambulatory Visit
Admission: RE | Admit: 2022-04-22 | Discharge: 2022-04-22 | Disposition: A | Discharge status: Home / Self Care | Payer: Medicare Other | Source: Ambulatory Visit | Attending: Radiation Oncology | Admitting: Radiation Oncology

## 2022-04-22 ENCOUNTER — Other Ambulatory Visit: Payer: Self-pay

## 2022-04-22 DIAGNOSIS — C50411 Malignant neoplasm of upper-outer quadrant of right female breast: Secondary | ICD-10-CM | POA: Diagnosis not present

## 2022-04-22 DIAGNOSIS — Z170 Estrogen receptor positive status [ER+]: Secondary | ICD-10-CM

## 2022-04-22 DIAGNOSIS — Z510 Encounter for antineoplastic radiation therapy: Secondary | ICD-10-CM | POA: Diagnosis not present

## 2022-04-22 LAB — RAD ONC ARIA SESSION SUMMARY
Course Elapsed Days: 22
Plan Fractions Treated to Date: 1
Plan Prescribed Dose Per Fraction: 2 Gy
Plan Total Fractions Prescribed: 5
Plan Total Prescribed Dose: 10 Gy
Reference Point Dosage Given to Date: 42.05 Gy
Reference Point Session Dosage Given: 2 Gy
Session Number: 16

## 2022-04-22 MED ORDER — RADIAPLEXRX EX GEL: Freq: Once | CUTANEOUS | Status: AC

## 2022-04-22 MED ORDER — ALRA NON-METALLIC DEODORANT (RAD-ONC)
1.0000 | Freq: Once | TOPICAL | Status: AC
  Administered 2022-04-22: 1 via TOPICAL

## 2022-04-25 ENCOUNTER — Ambulatory Visit: Payer: Medicare Other

## 2022-04-25 ENCOUNTER — Other Ambulatory Visit: Payer: Self-pay

## 2022-04-25 ENCOUNTER — Ambulatory Visit
Admission: RE | Admit: 2022-04-25 | Discharge: 2022-04-25 | Disposition: A | Discharge status: Home / Self Care | Payer: Medicare Other | Source: Ambulatory Visit | Attending: Radiation Oncology | Admitting: Radiation Oncology

## 2022-04-25 DIAGNOSIS — C50411 Malignant neoplasm of upper-outer quadrant of right female breast: Secondary | ICD-10-CM | POA: Diagnosis not present

## 2022-04-25 DIAGNOSIS — Z170 Estrogen receptor positive status [ER+]: Secondary | ICD-10-CM | POA: Diagnosis not present

## 2022-04-25 LAB — RAD ONC ARIA SESSION SUMMARY
Course Elapsed Days: 25
Plan Fractions Treated to Date: 2
Plan Prescribed Dose Per Fraction: 2 Gy
Plan Total Fractions Prescribed: 5
Plan Total Prescribed Dose: 10 Gy
Reference Point Dosage Given to Date: 44.05 Gy
Reference Point Session Dosage Given: 2 Gy
Session Number: 17

## 2022-04-26 ENCOUNTER — Other Ambulatory Visit: Payer: Self-pay

## 2022-04-26 ENCOUNTER — Ambulatory Visit: Payer: Medicare Other | Admitting: Physical Therapy

## 2022-04-26 ENCOUNTER — Ambulatory Visit
Admission: RE | Admit: 2022-04-26 | Discharge: 2022-04-26 | Disposition: A | Discharge status: Home / Self Care | Payer: Medicare Other | Source: Ambulatory Visit | Attending: Radiation Oncology | Admitting: Radiation Oncology

## 2022-04-26 DIAGNOSIS — Z170 Estrogen receptor positive status [ER+]: Secondary | ICD-10-CM | POA: Diagnosis not present

## 2022-04-26 DIAGNOSIS — C50411 Malignant neoplasm of upper-outer quadrant of right female breast: Secondary | ICD-10-CM | POA: Diagnosis not present

## 2022-04-26 LAB — RAD ONC ARIA SESSION SUMMARY
Course Elapsed Days: 26
Plan Fractions Treated to Date: 3
Plan Prescribed Dose Per Fraction: 2 Gy
Plan Total Fractions Prescribed: 5
Plan Total Prescribed Dose: 10 Gy
Reference Point Dosage Given to Date: 46.05 Gy
Reference Point Session Dosage Given: 2 Gy
Session Number: 18

## 2022-04-27 ENCOUNTER — Other Ambulatory Visit: Payer: Self-pay

## 2022-04-27 ENCOUNTER — Ambulatory Visit
Admission: RE | Admit: 2022-04-27 | Discharge: 2022-04-27 | Disposition: A | Discharge status: Home / Self Care | Payer: Medicare Other | Source: Ambulatory Visit | Attending: Radiation Oncology | Admitting: Radiation Oncology

## 2022-04-27 DIAGNOSIS — C50411 Malignant neoplasm of upper-outer quadrant of right female breast: Secondary | ICD-10-CM | POA: Diagnosis not present

## 2022-04-27 DIAGNOSIS — Z170 Estrogen receptor positive status [ER+]: Secondary | ICD-10-CM | POA: Diagnosis not present

## 2022-04-27 LAB — RAD ONC ARIA SESSION SUMMARY
Course Elapsed Days: 27
Plan Fractions Treated to Date: 4
Plan Prescribed Dose Per Fraction: 2 Gy
Plan Total Fractions Prescribed: 5
Plan Total Prescribed Dose: 10 Gy
Reference Point Dosage Given to Date: 48.05 Gy
Reference Point Session Dosage Given: 2 Gy
Session Number: 19

## 2022-04-28 ENCOUNTER — Ambulatory Visit
Admission: RE | Admit: 2022-04-28 | Discharge: 2022-04-28 | Disposition: A | Discharge status: Home / Self Care | Payer: Medicare Other | Source: Ambulatory Visit | Attending: Radiation Oncology | Admitting: Radiation Oncology

## 2022-04-28 ENCOUNTER — Encounter: Payer: Self-pay | Admitting: Radiation Oncology

## 2022-04-28 ENCOUNTER — Other Ambulatory Visit: Payer: Self-pay

## 2022-04-28 ENCOUNTER — Encounter: Payer: Self-pay | Admitting: *Deleted

## 2022-04-28 DIAGNOSIS — Z170 Estrogen receptor positive status [ER+]: Secondary | ICD-10-CM

## 2022-04-28 DIAGNOSIS — C50411 Malignant neoplasm of upper-outer quadrant of right female breast: Secondary | ICD-10-CM | POA: Diagnosis not present

## 2022-04-28 DIAGNOSIS — Z510 Encounter for antineoplastic radiation therapy: Secondary | ICD-10-CM | POA: Diagnosis not present

## 2022-04-28 LAB — RAD ONC ARIA SESSION SUMMARY
Course Elapsed Days: 28
Plan Fractions Treated to Date: 5
Plan Prescribed Dose Per Fraction: 2 Gy
Plan Total Fractions Prescribed: 5
Plan Total Prescribed Dose: 10 Gy
Reference Point Dosage Given to Date: 50.05 Gy
Reference Point Session Dosage Given: 2 Gy
Session Number: 20

## 2022-04-29 ENCOUNTER — Ambulatory Visit: Payer: Medicare Other

## 2022-04-29 DIAGNOSIS — M810 Age-related osteoporosis without current pathological fracture: Secondary | ICD-10-CM | POA: Diagnosis not present

## 2022-04-29 DIAGNOSIS — M5459 Other low back pain: Secondary | ICD-10-CM | POA: Diagnosis not present

## 2022-04-29 DIAGNOSIS — R293 Abnormal posture: Secondary | ICD-10-CM

## 2022-04-29 DIAGNOSIS — M546 Pain in thoracic spine: Secondary | ICD-10-CM

## 2022-04-29 DIAGNOSIS — M6281 Muscle weakness (generalized): Secondary | ICD-10-CM

## 2022-04-29 DIAGNOSIS — M542 Cervicalgia: Secondary | ICD-10-CM

## 2022-04-29 DIAGNOSIS — R252 Cramp and spasm: Secondary | ICD-10-CM | POA: Diagnosis not present

## 2022-04-29 NOTE — Therapy (Signed)
OUTPATIENT PHYSICAL THERAPY TREATMENT   Patient Name: Yolanda Morris MRN: [id number] DOB:October 04, 1946, 76 y.o., female Today's Date: 04/29/2022   PT End of Session - 04/29/22 0937     Visit Number 5    Number of Visits 12    Date for PT Re-Evaluation 06/01/22    Authorization Type MCR    PT Start Time 3254    PT Stop Time 0928    PT Time Calculation (min) 41 min    Activity Tolerance Patient tolerated treatment well    Behavior During Therapy [facility] for tasks assessed/performed               Past Medical History:  Diagnosis Date   Cancer ([place])    skin   COPD (chronic obstructive pulmonary disease) ([place])    DM (diabetes mellitus) ([place])    Dyspnea    increased activity    Family history of ovarian cancer 02/03/2022   Family history of stomach cancer 02/03/2022   Genital herpes    GERD (gastroesophageal reflux disease)    Hyperlipidemia    Migraines    Osteoporosis    Unspecified essential hypertension    Past Surgical History:  Procedure Laterality Date   BREAST BIOPSY     BREAST EXCISIONAL BIOPSY     over 20 years ago- benign   BREAST LUMPECTOMY WITH RADIOACTIVE SEED AND SENTINEL LYMPH NODE BIOPSY Right 02/16/2022   Procedure: RIGHT BREAST LUMPECTOMY WITH RADIOACTIVE SEED AND SENTINEL LYMPH NODE BIOPSY;  Surgeon: [name], MD;  Location: [facility];  Service: General;  Laterality: Right;   BUNIONECTOMY Left 2004   and hammer toe repair Left foot   COLON SURGERY     removal of polyps   [place]     Patient Active Problem List   Diagnosis Date Noted   Genetic testing 02/09/2022   Family history of ovarian cancer 02/03/2022   Family history of stomach cancer 02/03/2022   Malignant neoplasm of upper-outer quadrant of right breast in female, estrogen receptor positive ([place]) 01/28/2022   Medication management 07/21/2020   Healthcare maintenance 07/21/2020   Body aches 07/22/2019   Chronic respiratory failure with  hypoxia ([place]) 06/17/2019   Allergic rhinitis 11/13/2018   Shortness of breath 10/23/2018   COPD exacerbation ([place]) 01/07/2015   Cough 07/05/2013   Acute sinusitis 02/12/2013   Acute bronchitis 07/08/2011   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) 01/05/2011    PCP: [name], MD  REFERRING PROVIDER: [name], MD   REFERRING DIAG: C50.411,Z17.0 (ICD-10-CM) - Malignant neoplasm of upper-outer quadrant of right breast in female, estrogen receptor positive ([place]); Increased back pain - recent lumpectomy and osteoporosis  Rationale for Evaluation and Treatment Rehabilitation  THERAPY DIAG:  Pain in thoracic spine  Cervicalgia  Abnormal posture  Muscle weakness (generalized)  Cramp and spasm  Other low back pain  ONSET DATE: 02/16/22  SUBJECTIVE:  SUBJECTIVE STATEMENT: My left leg has been bothering me, I don't know if I slept wrong or what. No pain today just soreness.  PERTINENT HISTORY:  Patient was diagnosed on 12/27/2021 with right grade 2 invasive ductal carcinoma breast cancer.   Post Rt breast lumpectomy with no carcinoma in 4 removed nodes on 02/16/22. Started radiation therapy 03/31/22. It's Monday-Thursday for 4 weeks. Last session scheduled 04/28/22. OP, h/o vertigo, stage 4 COPD and is on 2 liters of oxygen when she is walking. Brings O2 with her.  Compression fracture T7  PAIN:  Are you having pain? No: NPRS scale: 0/10 Pain location: L UT/shoulder Pain description: achy Aggravating factors: radiation bed/hard surfaces; bending forward Relieving factors: heat, advil    PRECAUTIONS: Other: RECENT R LUMPECTOMY 02/16/22; undergoing radiation therapy.  Light resistance only.   WEIGHT BEARING RESTRICTIONS No  FALLS:  Has patient fallen in last 6 months? No  LIVING  ENVIRONMENT: Lives with: lives alone Lives in: House/apartment Stairs: No Has following equipment at home: None  OCCUPATION: retired  PLOF: Independent  PATIENT GOALS  Wants to move more, decrease pain   OBJECTIVE:   DIAGNOSTIC FINDINGS:  Bone Density: Site Region Measured Date Measured Age YA BMD Significant CHANGE T-score AP Spine L1-L4 10/21/2021 75.4 -3.2 0.794 g/cm2  DualFemur Total Left 10/21/2021 75.4 -2.0 0.752 g/cm2  DualFemur Total Mean 10/21/2021 75.4 -2.0 0.761 g/cm2  Left Forearm Radius 33% 10/21/2021 75.4 -3.5 0.567 g/cm2  The BMD measured at Forearm Radius 33% is 0.567 g/cm2 with a T-score of -3.5. This patient is considered osteoporotic according to [facility]) criteria.  PATIENT SURVEYS:  NDI 14/50    MUSCLE LENGTH:   POSTURE: rounded shoulders, forward head, increased thoracic kyphosis, and tight left QL creating R convex lumbar scoliosis  PALPATION: Sore in subocciptals, bil UT along upper and mid thoracic paraspinals  LUMBAR ROM:   Active  A/PROM  eval  Flexion WFL, Limited by HS  Extension WFL  Right lateral flexion Limited by tight left QL  Left lateral flexion WNL  Right rotation 50%  Left rotation 50%   (Blank rows = not tested)  THORACIC ROTATION:  limited 75% Left and R 50% limited  CERVICAL ROM:  Active  A/PROM  eval  Flexion WNL  Extension WNL  Right lateral flexion 50%  Left lateral flexion 50%  Right rotation 50%  Left rotation 50%    LOWER EXTREMITY ROM:     WFL for exam  LOWER EXTREMITY MMT:    MMT Right eval Left eval  Hip flexion 4 4  Hip extension    Hip abduction 4+ 4+  Hip adduction 4 4  Hip internal rotation    Hip external rotation    Knee flexion 4+ 4  Knee extension 4+ 4+  Ankle dorsiflexion 5 5  Ankle plantarflexion    Ankle inversion    Ankle eversion     (Blank rows = not tested)  UPPER EXTREMITY STRENGTH: WNL   TODAY'S TREATMENT  04/29/22 Therapeutic  Exercise: %SpO2 taken during WA - 91% at 5 min, 92% after 8 min Nustep L2x36mn Seated UT stretch x 30 sec each side Seated LS stretch x 30 sec each side Cervical extension with pillowcase for support 10x3" Thoracic extension over chair arms crossed 10x3" Standing ER standing against doorframe 10x YTB Standing horizontal ABD standing against doorframe 10x YTB Wall angels 10x  Manual Therapy: STM to B thoracic and cervical paraspinals, rhomboids, UT, LS 04/21/2022 Therapeutic Exercise: to improve  strength and mobility.  Demo, verbal and tactile cues throughout for technique. Nustep L4 x 3 min, reduced to L2 x 3 min due to dyspnea on exertion Seated neck exercises Wall slides x 10 bil  Standing rows YTB 2 x 10 Scap squeezes with YTB 2 x 10  Manual Therapy: to decrease muscle spasm and pain and improve mobility.  STM/TPR to cervical paraspanals, L UT, bil levator scapulae, rhomboids, grade 1-2 PA mobs thoracic spine.    04/13/22 Therapeutic Exercise: to improve strength and mobility.   Nustep L3 x 5 min   post O2 93%, HR 116  Standing open books for thoracic mobilization x 10 bil  Seated arm raises with weight shift for core/side stretch x 10 bil  Seated arm raise/marching for strengthening [place] on elbows - for thoracic stretch Manual Therapy: to decrease muscle spasm and pain and improve mobility In prone - STM to thoracic paraspinals, rhomboids, cervical paraspinals, bil UT, levator scapulae.  STM to L lats and posterior shoulder.  . 04/08/22 Therapeutic Exercise: Nustep L1x78mn UT stretch x 30 sec bil LS stretch x 30 sec bil Assesed BERG and DGI BERG: 45/54 DGI: 17/24 04/06/22 See HEP   PATIENT EDUCATION:  Education details: HEP updated  Person educated: Patient Education method: Explanation, Demonstration, Verbal cues, and Handouts Education comprehension: verbalized understanding and returned demonstration   HOME EXERCISE PROGRAM: Access Code:  [email or web address]  ASSESSMENT:  CLINICAL IMPRESSION: Mrs. HAlloccaresponded well to treatment evident by no subjective reports of pain. Checked O2 sats during warm up which maintained above 90%. Progressed postural strengthening providing instruction on scapular retraction and postural alignment. Worked on cervical ROM to improve mobility. Pt had good response to MT post session.   OBJECTIVE IMPAIRMENTS decreased ROM, decreased strength, increased muscle spasms, impaired flexibility, postural dysfunction, and pain.   ACTIVITY LIMITATIONS carrying, lifting, and bending  PARTICIPATION LIMITATIONS:  pain affects all ADLS  PERSONAL FACTORS 3+ comorbidities: Breast Cancer and current radiation tx, OP, h/o vertigo, stage 4 COPD   are also affecting patient's functional outcome.   REHAB POTENTIAL: Good  CLINICAL DECISION MAKING: Evolving/moderate complexity  EVALUATION COMPLEXITY: Low   GOALS: Goals reviewed with patient? Yes  SHORT TERM GOALS: Target date: 04/20/2022  Ind with initial HEP Baseline: Goal status: MET  2.  Balance assessed and goals set Baseline:  Goal status: MET  LONG TERM GOALS: Target date: 06/01/2022  Decreased pain by 75% or more to improve pt's QOL. Baseline:  Goal status: IN PROGRESS  2. Improved cervical rotation to [facility] ADLS. Baseline:  Goal status: IN PROGRESS  3.  Improved tolerance to exercise and/or cardiovascular activity by 50% or more to improve health outcomes.  Baseline:  Goal status: IN PROGRESS  4.  Improved LE strength to 4+/5 or better to allow for improved body mechanics with ADLs. Baseline:  Goal status: IN PROGRESS  5.  Improved NDI to <= 5 demonstrating decreased disability Baseline: 14 Goal status: IN PROGRESS   PLAN: PT FREQUENCY: 2x/week  PT DURATION: 8 weeks  PLANNED INTERVENTIONS: Therapeutic exercises, Therapeutic activity, Neuromuscular re-education, Balance training, Gait training, Patient/Family education, Joint  mobilization, Aquatic Therapy, Dry Needling, Spinal mobilization, Cryotherapy, Taping, and Manual therapy.  PLAN FOR NEXT SESSION:  general postural and LE strengthening avoiding flexion due to OP, manual therapy to upper back /neck, balance exercise, monitor for vertigo   BArtist Pais PTA 04/29/2022, 9:38 AM

## 2022-05-03 ENCOUNTER — Ambulatory Visit: Payer: Medicare Other | Admitting: Physical Therapy

## 2022-05-03 ENCOUNTER — Encounter: Payer: Self-pay | Admitting: Physical Therapy

## 2022-05-03 DIAGNOSIS — M5459 Other low back pain: Secondary | ICD-10-CM | POA: Diagnosis not present

## 2022-05-03 DIAGNOSIS — M546 Pain in thoracic spine: Secondary | ICD-10-CM | POA: Diagnosis not present

## 2022-05-03 DIAGNOSIS — M6281 Muscle weakness (generalized): Secondary | ICD-10-CM

## 2022-05-03 DIAGNOSIS — R293 Abnormal posture: Secondary | ICD-10-CM

## 2022-05-03 DIAGNOSIS — M542 Cervicalgia: Secondary | ICD-10-CM | POA: Diagnosis not present

## 2022-05-03 DIAGNOSIS — R252 Cramp and spasm: Secondary | ICD-10-CM | POA: Diagnosis not present

## 2022-05-03 NOTE — Therapy (Signed)
OUTPATIENT PHYSICAL THERAPY TREATMENT   Patient Name: Yolanda Morris MRN: [id number] DOB:09-10-1946, 76 y.o., female Today's Date: 05/03/2022   PT End of Session - 05/03/22 0934     Visit Number 6    Number of Visits 12    Date for PT Re-Evaluation 06/01/22    Authorization Type MCR    PT Start Time 0932    PT Stop Time 1017    PT Time Calculation (min) 45 min    Activity Tolerance Patient tolerated treatment well    Behavior During Therapy WFL for tasks assessed/performed               Past Medical History:  Diagnosis Date   Cancer ([place])    skin   COPD (chronic obstructive pulmonary disease) ([place])    DM (diabetes mellitus) ([place])    Dyspnea    increased activity    Family history of ovarian cancer 02/03/2022   Family history of stomach cancer 02/03/2022   Genital herpes    GERD (gastroesophageal reflux disease)    Hyperlipidemia    Migraines    Osteoporosis    Unspecified essential hypertension    Past Surgical History:  Procedure Laterality Date   BREAST BIOPSY     BREAST EXCISIONAL BIOPSY     over 20 years ago- benign   BREAST LUMPECTOMY WITH RADIOACTIVE SEED AND SENTINEL LYMPH NODE BIOPSY Right 02/16/2022   Procedure: RIGHT BREAST LUMPECTOMY WITH RADIOACTIVE SEED AND SENTINEL LYMPH NODE BIOPSY;  Surgeon: [name], MD;  Location: [facility];  Service: General;  Laterality: Right;   BUNIONECTOMY Left 2004   and hammer toe repair Left foot   COLON SURGERY     removal of polyps   [place]     Patient Active Problem List   Diagnosis Date Noted   Genetic testing 02/09/2022   Family history of ovarian cancer 02/03/2022   Family history of stomach cancer 02/03/2022   Malignant neoplasm of upper-outer quadrant of right breast in female, estrogen receptor positive ([place]) 01/28/2022   Medication management 07/21/2020   Healthcare maintenance 07/21/2020   Body aches 07/22/2019   Chronic respiratory failure with  hypoxia ([place]) 06/17/2019   Allergic rhinitis 11/13/2018   Shortness of breath 10/23/2018   COPD exacerbation ([place]) 01/07/2015   Cough 07/05/2013   Acute sinusitis 02/12/2013   Acute bronchitis 07/08/2011   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) 01/05/2011    PCP: [name], MD  REFERRING PROVIDER: [name], MD   REFERRING DIAG: C50.411,Z17.0 (ICD-10-CM) - Malignant neoplasm of upper-outer quadrant of right breast in female, estrogen receptor positive ([place]); Increased back pain - recent lumpectomy and osteoporosis  Rationale for Evaluation and Treatment Rehabilitation  THERAPY DIAG:  Pain in thoracic spine  Cervicalgia  Abnormal posture  Muscle weakness (generalized)  Cramp and spasm  ONSET DATE: 02/16/22  SUBJECTIVE:  SUBJECTIVE STATEMENT: Pt. Reports feeling fatigue from radiation therapy boost. She did finish radiation therapy on Thursday and traveled this weekend, which was also tiring.  Was using supplemental O2 just prior to session.  Just a twinge in back this morning, no pain now.   PERTINENT HISTORY:  Patient was diagnosed on 12/27/2021 with right grade 2 invasive ductal carcinoma breast cancer.   Post Rt breast lumpectomy with no carcinoma in 4 removed nodes on 02/16/22. Started radiation therapy 03/31/22. It's Monday-Thursday for 4 weeks. Last session scheduled 04/28/22. OP, h/o vertigo, stage 4 COPD and is on 2 liters of oxygen when she is walking. Brings O2 with her.  Compression fracture T7  PAIN:  Are you having pain? No: NPRS scale: 0/10 Pain location: L UT/shoulder Pain description: achy Aggravating factors: radiation bed/hard surfaces; bending forward Relieving factors: heat, advil    PRECAUTIONS: Other: RECENT R LUMPECTOMY 02/16/22; undergoing radiation therapy.   Light resistance only.   WEIGHT BEARING RESTRICTIONS No  FALLS:  Has patient fallen in last 6 months? No  LIVING ENVIRONMENT: Lives with: lives alone Lives in: House/apartment Stairs: No Has following equipment at home: None  OCCUPATION: retired  PLOF: Independent  PATIENT GOALS  Wants to move more, decrease pain   OBJECTIVE:   DIAGNOSTIC FINDINGS:  Bone Density: Site Region Measured Date Measured Age YA BMD Significant CHANGE T-score AP Spine L1-L4 10/21/2021 75.4 -3.2 0.794 g/cm2  DualFemur Total Left 10/21/2021 75.4 -2.0 0.752 g/cm2  DualFemur Total Mean 10/21/2021 75.4 -2.0 0.761 g/cm2  Left Forearm Radius 33% 10/21/2021 75.4 -3.5 0.567 g/cm2  The BMD measured at Forearm Radius 33% is 0.567 g/cm2 with a T-score of -3.5. This patient is considered osteoporotic according to [facility]) criteria.  PATIENT SURVEYS:  NDI 14/50    MUSCLE LENGTH:   POSTURE: rounded shoulders, forward head, increased thoracic kyphosis, and tight left QL creating R convex lumbar scoliosis  PALPATION: Sore in subocciptals, bil UT along upper and mid thoracic paraspinals  LUMBAR ROM:   Active  A/PROM  eval  Flexion WFL, Limited by HS  Extension WFL  Right lateral flexion Limited by tight left QL  Left lateral flexion WNL  Right rotation 50%  Left rotation 50%   (Blank rows = not tested)  THORACIC ROTATION:  limited 75% Left and R 50% limited  CERVICAL ROM:  Active  A/PROM  eval  Flexion WNL  Extension WNL  Right lateral flexion 50%  Left lateral flexion 50%  Right rotation 50%  Left rotation 50%    LOWER EXTREMITY ROM:     WFL for exam  LOWER EXTREMITY MMT:    MMT Right eval Left eval  Hip flexion 4 4  Hip extension    Hip abduction 4+ 4+  Hip adduction 4 4  Hip internal rotation    Hip external rotation    Knee flexion 4+ 4  Knee extension 4+ 4+  Ankle dorsiflexion 5 5  Ankle plantarflexion    Ankle inversion    Ankle eversion      (Blank rows = not tested)  UPPER EXTREMITY STRENGTH: WNL   TODAY'S TREATMENT  05/03/2022 Therapeutic Exercise: to improve strength and mobility.  Demo, verbal and tactile cues throughout for technique. Nustep L2 x 6 min, x 5 min (dyspnea on exertion but SpO2 93%, HR 115 during and after, cues for pursed lip breathing). At counter for safety: Heel/toe raises x 10 Standing hip extension x 10 bil Standing hip abduction x 10 bil  Sit to stands x 10 - Sp02 87%, HR 115 - given seated rest break to recover until SpO2 94%, HR 107 Hand exercises with red theraputty Standing rows YTB x 20  Standing shoulder extension YTB x 20   Standing ER standing against doorframe 20x YTB SpO2 94%,  Standing marching x 1 min -SpO2 93%, HR 112 2MWT - 305', SpO2 93%, HR 107   04/29/22 Therapeutic Exercise: %SpO2 taken during WA - 91% at 5 min, 92% after 8 min Nustep L2x8mn Seated UT stretch x 30 sec each side Seated LS stretch x 30 sec each side Cervical extension with pillowcase for support 10x3" Thoracic extension over chair arms crossed 10x3" Standing ER standing against doorframe 10x YTB Standing horizontal ABD standing against doorframe 10x YTB Wall angels 10x  Manual Therapy: STM to B thoracic and cervical paraspinals, rhomboids, UT, LS  04/21/2022 Therapeutic Exercise: to improve strength and mobility.  Demo, verbal and tactile cues throughout for technique. Nustep L4 x 3 min, reduced to L2 x 3 min due to dyspnea on exertion Seated neck exercises Wall slides x 10 bil  Standing rows YTB 2 x 10 Scap squeezes with YTB 2 x 10  Manual Therapy: to decrease muscle spasm and pain and improve mobility.  STM/TPR to cervical paraspanals, L UT, bil levator scapulae, rhomboids, grade 1-2 PA mobs thoracic spine.   PATIENT EDUCATION:  Education details: HEP updated  Person educated: Patient Education method: Explanation, Demonstration, Verbal cues, and Handouts Education comprehension: verbalized  understanding and returned demonstration   HOME EXERCISE PROGRAM: Access Code: [email or web address]  ASSESSMENT:  CLINICAL IMPRESSION: Mrs. HHightreports significant improvement in back pain, meeting LTG #1, but still demonstrates poor tolerance to exercise.  She had shortness of breath on Nustep despite O2 remaining above 92%, but she was wearing a mask.  She took her mask off for remainder of session (kept at a distance from other patients in clinic) and had less dyspnea on exertion, however noted that despite not being shortness of breath she fell to 87% after sit to stands.  Given seated rest break and worked on hand strengthening until O2 returned to 93-94%.  She tolerated all other standing exercises maintaining oxygen levels, and completed 305' during 2MWT with O2 93% afterwards.  She would benefit form continued skilled therapy to continue to improve cardiovascular endurance, LE strength, balance and safety.    OBJECTIVE IMPAIRMENTS decreased ROM, decreased strength, increased muscle spasms, impaired flexibility, postural dysfunction, and pain.   ACTIVITY LIMITATIONS carrying, lifting, and bending  PARTICIPATION LIMITATIONS:  pain affects all ADLS  PERSONAL FACTORS 3+ comorbidities: Breast Cancer and current radiation tx, OP, h/o vertigo, stage 4 COPD   are also affecting patient's functional outcome.   REHAB POTENTIAL: Good  CLINICAL DECISION MAKING: Evolving/moderate complexity  EVALUATION COMPLEXITY: Low   GOALS: Goals reviewed with patient? Yes  SHORT TERM GOALS: Target date: 04/20/2022  Ind with initial HEP Baseline: Goal status: MET  2.  Balance assessed and goals set Baseline:  Goal status: MET  LONG TERM GOALS: Target date: 06/01/2022  Decreased pain by 75% or more to improve pt's QOL. Baseline:  Goal status: MET 05/03/2022 reports no back pain  2. Improved cervical rotation to [facility] ADLS. Baseline:  Goal status: IN PROGRESS  3.  Improved tolerance to exercise  and/or cardiovascular activity by 50% or more to improve health outcomes.  Baseline:  Goal status: IN PROGRESS  4.  Improved LE strength to 4+/5 or better to allow  for improved body mechanics with ADLs. Baseline:  Goal status: IN PROGRESS  5.  Improved NDI to <= 5 demonstrating decreased disability Baseline: 14 Goal status: IN PROGRESS   PLAN: PT FREQUENCY: 2x/week  PT DURATION: 8 weeks  PLANNED INTERVENTIONS: Therapeutic exercises, Therapeutic activity, Neuromuscular re-education, Balance training, Gait training, Patient/Family education, Joint mobilization, Aquatic Therapy, Dry Needling, Spinal mobilization, Cryotherapy, Taping, and Manual therapy.  PLAN FOR NEXT SESSION:  Focus on improving cardiovascular endurance to tolerance.  Monitor O2   Rennie Natter, [place], DPT 05/03/2022, 10:38 AM

## 2022-05-06 ENCOUNTER — Ambulatory Visit: Payer: Medicare Other

## 2022-05-06 DIAGNOSIS — R252 Cramp and spasm: Secondary | ICD-10-CM | POA: Diagnosis not present

## 2022-05-06 DIAGNOSIS — M6281 Muscle weakness (generalized): Secondary | ICD-10-CM | POA: Diagnosis not present

## 2022-05-06 DIAGNOSIS — M546 Pain in thoracic spine: Secondary | ICD-10-CM | POA: Diagnosis not present

## 2022-05-06 DIAGNOSIS — R293 Abnormal posture: Secondary | ICD-10-CM | POA: Diagnosis not present

## 2022-05-06 DIAGNOSIS — M5459 Other low back pain: Secondary | ICD-10-CM | POA: Diagnosis not present

## 2022-05-06 DIAGNOSIS — M542 Cervicalgia: Secondary | ICD-10-CM

## 2022-05-06 NOTE — Therapy (Signed)
OUTPATIENT PHYSICAL THERAPY TREATMENT   Patient Name: Yolanda Morris MRN: [id number] DOB:1946-06-22, 76 y.o., female Today's Date: 05/06/2022   PT End of Session - 05/06/22 1016     Visit Number 7    Number of Visits 12    Date for PT Re-Evaluation 06/01/22    Authorization Type MCR    PT Start Time 0933    PT Stop Time 1014    PT Time Calculation (min) 41 min    Activity Tolerance Patient tolerated treatment well    Behavior During Therapy WFL for tasks assessed/performed                Past Medical History:  Diagnosis Date   Cancer ([place])    skin   COPD (chronic obstructive pulmonary disease) ([place])    DM (diabetes mellitus) ([place])    Dyspnea    increased activity    Family history of ovarian cancer 02/03/2022   Family history of stomach cancer 02/03/2022   Genital herpes    GERD (gastroesophageal reflux disease)    Hyperlipidemia    Migraines    Osteoporosis    Unspecified essential hypertension    Past Surgical History:  Procedure Laterality Date   BREAST BIOPSY     BREAST EXCISIONAL BIOPSY     over 20 years ago- benign   BREAST LUMPECTOMY WITH RADIOACTIVE SEED AND SENTINEL LYMPH NODE BIOPSY Right 02/16/2022   Procedure: RIGHT BREAST LUMPECTOMY WITH RADIOACTIVE SEED AND SENTINEL LYMPH NODE BIOPSY;  Surgeon: [name], MD;  Location: [facility];  Service: General;  Laterality: Right;   BUNIONECTOMY Left 2004   and hammer toe repair Left foot   COLON SURGERY     removal of polyps   [place]     Patient Active Problem List   Diagnosis Date Noted   Genetic testing 02/09/2022   Family history of ovarian cancer 02/03/2022   Family history of stomach cancer 02/03/2022   Malignant neoplasm of upper-outer quadrant of right breast in female, estrogen receptor positive ([place]) 01/28/2022   Medication management 07/21/2020   Healthcare maintenance 07/21/2020   Body aches 07/22/2019   Chronic respiratory failure with  hypoxia ([place]) 06/17/2019   Allergic rhinitis 11/13/2018   Shortness of breath 10/23/2018   COPD exacerbation ([place]) 01/07/2015   Cough 07/05/2013   Acute sinusitis 02/12/2013   Acute bronchitis 07/08/2011   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) 01/05/2011    PCP: [name], MD  REFERRING PROVIDER: [name], MD   REFERRING DIAG: C50.411,Z17.0 (ICD-10-CM) - Malignant neoplasm of upper-outer quadrant of right breast in female, estrogen receptor positive ([place]); Increased back pain - recent lumpectomy and osteoporosis  Rationale for Evaluation and Treatment Rehabilitation  THERAPY DIAG:  Pain in thoracic spine  Cervicalgia  Abnormal posture  Muscle weakness (generalized)  Cramp and spasm  ONSET DATE: 02/16/22  SUBJECTIVE:  SUBJECTIVE STATEMENT: Pulled weeds in the yard yesterday, then her mid back started hurting.  PERTINENT HISTORY:  Patient was diagnosed on 12/27/2021 with right grade 2 invasive ductal carcinoma breast cancer.   Post Rt breast lumpectomy with no carcinoma in 4 removed nodes on 02/16/22. Started radiation therapy 03/31/22. It's Monday-Thursday for 4 weeks. Last session scheduled 04/28/22. OP, h/o vertigo, stage 4 COPD and is on 2 liters of oxygen when she is walking. Brings O2 with her.  Compression fracture T7  PAIN:  Are you having pain? No: NPRS scale: 2-3/10 Pain location: thoracic spine Pain description: achy Aggravating factors: radiation bed/hard surfaces; bending forward Relieving factors: heat, advil    PRECAUTIONS: Other: RECENT R LUMPECTOMY 02/16/22; undergoing radiation therapy.  Light resistance only.   WEIGHT BEARING RESTRICTIONS No  FALLS:  Has patient fallen in last 6 months? No  LIVING ENVIRONMENT: Lives with: lives alone Lives in:  House/apartment Stairs: No Has following equipment at home: None  OCCUPATION: retired  PLOF: Independent  PATIENT GOALS  Wants to move more, decrease pain   OBJECTIVE:   DIAGNOSTIC FINDINGS:  Bone Density: Site Region Measured Date Measured Age YA BMD Significant CHANGE T-score AP Spine L1-L4 10/21/2021 75.4 -3.2 0.794 g/cm2  DualFemur Total Left 10/21/2021 75.4 -2.0 0.752 g/cm2  DualFemur Total Mean 10/21/2021 75.4 -2.0 0.761 g/cm2  Left Forearm Radius 33% 10/21/2021 75.4 -3.5 0.567 g/cm2  The BMD measured at Forearm Radius 33% is 0.567 g/cm2 with a T-score of -3.5. This patient is considered osteoporotic according to [facility]) criteria.  PATIENT SURVEYS:  NDI 14/50    MUSCLE LENGTH:   POSTURE: rounded shoulders, forward head, increased thoracic kyphosis, and tight left QL creating R convex lumbar scoliosis  PALPATION: Sore in subocciptals, bil UT along upper and mid thoracic paraspinals  LUMBAR ROM:   Active  A/PROM  eval  Flexion WFL, Limited by HS  Extension WFL  Right lateral flexion Limited by tight left QL  Left lateral flexion WNL  Right rotation 50%  Left rotation 50%   (Blank rows = not tested)  THORACIC ROTATION:  limited 75% Left and R 50% limited  CERVICAL ROM:  Active  A/PROM  eval  Flexion WNL  Extension WNL  Right lateral flexion 50%  Left lateral flexion 50%  Right rotation 50%  Left rotation 50%    LOWER EXTREMITY ROM:     WFL for exam  LOWER EXTREMITY MMT:    MMT Right eval Left eval  Hip flexion 4 4  Hip extension    Hip abduction 4+ 4+  Hip adduction 4 4  Hip internal rotation    Hip external rotation    Knee flexion 4+ 4  Knee extension 4+ 4+  Ankle dorsiflexion 5 5  Ankle plantarflexion    Ankle inversion    Ankle eversion     (Blank rows = not tested)  UPPER EXTREMITY STRENGTH: WNL   TODAY'S TREATMENT  05/05/22 Theapeutic Exercise: Nustep L3x70mn Standing row with red TB x  10 Standing extension with red TB x 10 R/L trunk rotation with red TB x 10  Mini wall push up 10x STS 10x - O2 sats 88% after - 90% after recovery  Gait training: 2x170 ft down/back - monitored O2 sats during rest breaks (2) - O2 sats remained above 90%   05/03/2022 Therapeutic Exercise: to improve strength and mobility.  Demo, verbal and tactile cues throughout for technique. Nustep L2 x 6 min, x 5 min (dyspnea on  exertion but SpO2 93%, HR 115 during and after, cues for pursed lip breathing). At counter for safety: Heel/toe raises x 10 Standing hip extension x 10 bil Standing hip abduction x 10 bil Sit to stands x 10 - Sp02 87%, HR 115 - given seated rest break to recover until SpO2 94%, HR 107 Hand exercises with red theraputty Standing rows YTB x 20  Standing shoulder extension YTB x 20   Standing ER standing against doorframe 20x YTB SpO2 94%,  Standing marching x 1 min -SpO2 93%, HR 112 2MWT - 305', SpO2 93%, HR 107   04/29/22 Therapeutic Exercise: %SpO2 taken during WA - 91% at 5 min, 92% after 8 min Nustep L2x24mn Seated UT stretch x 30 sec each side Seated LS stretch x 30 sec each side Cervical extension with pillowcase for support 10x3" Thoracic extension over chair arms crossed 10x3" Standing ER standing against doorframe 10x YTB Standing horizontal ABD standing against doorframe 10x YTB Wall angels 10x  Manual Therapy: STM to B thoracic and cervical paraspinals, rhomboids, UT, LS  04/21/2022 Therapeutic Exercise: to improve strength and mobility.  Demo, verbal and tactile cues throughout for technique. Nustep L4 x 3 min, reduced to L2 x 3 min due to dyspnea on exertion Seated neck exercises Wall slides x 10 bil  Standing rows YTB 2 x 10 Scap squeezes with YTB 2 x 10  Manual Therapy: to decrease muscle spasm and pain and improve mobility.  STM/TPR to cervical paraspanals, L UT, bil levator scapulae, rhomboids, grade 1-2 PA mobs thoracic spine.   PATIENT  EDUCATION:  Education details: HEP updated  Person educated: Patient Education method: Explanation, Demonstration, Verbal cues, and Handouts Education comprehension: verbalized understanding and returned demonstration   HOME EXERCISE PROGRAM: Access Code: [email or web address]  ASSESSMENT:  CLINICAL IMPRESSION: Progressed postural strengthening and endurance training. Monitered O2 sats throughout session and gave cues for PLB when she felt SOB. She had difficulty with the shld extensions with red TB and c/o R arm pain so we discontinued TB exercises. Cues required to isolate thoracic rotation from shoulder movement.    OBJECTIVE IMPAIRMENTS decreased ROM, decreased strength, increased muscle spasms, impaired flexibility, postural dysfunction, and pain.   ACTIVITY LIMITATIONS carrying, lifting, and bending  PARTICIPATION LIMITATIONS:  pain affects all ADLS  PERSONAL FACTORS 3+ comorbidities: Breast Cancer and current radiation tx, OP, h/o vertigo, stage 4 COPD   are also affecting patient's functional outcome.   REHAB POTENTIAL: Good  CLINICAL DECISION MAKING: Evolving/moderate complexity  EVALUATION COMPLEXITY: Low   GOALS: Goals reviewed with patient? Yes  SHORT TERM GOALS: Target date: 04/20/2022  Ind with initial HEP Baseline: Goal status: MET  2.  Balance assessed and goals set Baseline:  Goal status: MET  LONG TERM GOALS: Target date: 06/01/2022  Decreased pain by 75% or more to improve pt's QOL. Baseline:  Goal status: MET 05/03/2022 reports no back pain  2. [facility]. Baseline:  Goal status: IN PROGRESS  3.  Improved tolerance to exercise and/or cardiovascular activity by 50% or more to improve health outcomes.  Baseline:  Goal status: IN PROGRESS  4.  Improved LE strength to 4+/5 or better to allow for improved body mechanics with ADLs. Baseline:  Goal status: IN PROGRESS  5.  Improved NDI to <= 5 demonstrating decreased  disability Baseline: 14 Goal status: IN PROGRESS   PLAN: PT FREQUENCY: 2x/week  PT DURATION: 8 weeks  PLANNED INTERVENTIONS: Therapeutic exercises, Therapeutic activity, Neuromuscular re-education,  Balance training, Gait training, Patient/Family education, Joint mobilization, Aquatic Therapy, Dry Needling, Spinal mobilization, Cryotherapy, Taping, and Manual therapy.  PLAN FOR NEXT SESSION:  Focus on improving cardiovascular endurance to tolerance.  Monitor O2   Artist Pais, PTA 05/06/2022, 10:17 AM

## 2022-05-09 ENCOUNTER — Ambulatory Visit: Payer: Medicare Other

## 2022-05-09 DIAGNOSIS — R293 Abnormal posture: Secondary | ICD-10-CM

## 2022-05-09 DIAGNOSIS — M542 Cervicalgia: Secondary | ICD-10-CM

## 2022-05-09 DIAGNOSIS — R252 Cramp and spasm: Secondary | ICD-10-CM

## 2022-05-09 DIAGNOSIS — M5459 Other low back pain: Secondary | ICD-10-CM | POA: Diagnosis not present

## 2022-05-09 DIAGNOSIS — M546 Pain in thoracic spine: Secondary | ICD-10-CM | POA: Diagnosis not present

## 2022-05-09 DIAGNOSIS — M6281 Muscle weakness (generalized): Secondary | ICD-10-CM | POA: Diagnosis not present

## 2022-05-09 NOTE — Therapy (Signed)
OUTPATIENT PHYSICAL THERAPY TREATMENT   Patient Name: Yolanda Morris MRN: [id number] DOB:Nov 28, 1945, 76 y.o., female Today's Date: 05/09/2022   PT End of Session - 05/09/22 1621     Visit Number 8    Number of Visits 12    Date for PT Re-Evaluation 06/01/22    Authorization Type MCR    PT Start Time 1527    PT Stop Time 1191    PT Time Calculation (min) 48 min    Activity Tolerance Patient tolerated treatment well    Behavior During Therapy WFL for tasks assessed/performed                 Past Medical History:  Diagnosis Date   Cancer ([place])    skin   COPD (chronic obstructive pulmonary disease) ([place])    DM (diabetes mellitus) ([place])    Dyspnea    increased activity    Family history of ovarian cancer 02/03/2022   Family history of stomach cancer 02/03/2022   Genital herpes    GERD (gastroesophageal reflux disease)    Hyperlipidemia    Migraines    Osteoporosis    Unspecified essential hypertension    Past Surgical History:  Procedure Laterality Date   BREAST BIOPSY     BREAST EXCISIONAL BIOPSY     over 20 years ago- benign   BREAST LUMPECTOMY WITH RADIOACTIVE SEED AND SENTINEL LYMPH NODE BIOPSY Right 02/16/2022   Procedure: RIGHT BREAST LUMPECTOMY WITH RADIOACTIVE SEED AND SENTINEL LYMPH NODE BIOPSY;  Surgeon: [name], MD;  Location: [facility];  Service: General;  Laterality: Right;   BUNIONECTOMY Left 2004   and hammer toe repair Left foot   COLON SURGERY     removal of polyps   [place]     Patient Active Problem List   Diagnosis Date Noted   Genetic testing 02/09/2022   Family history of ovarian cancer 02/03/2022   Family history of stomach cancer 02/03/2022   Malignant neoplasm of upper-outer quadrant of right breast in female, estrogen receptor positive ([place]) 01/28/2022   Medication management 07/21/2020   Healthcare maintenance 07/21/2020   Body aches 07/22/2019   Chronic respiratory failure  with hypoxia ([place]) 06/17/2019   Allergic rhinitis 11/13/2018   Shortness of breath 10/23/2018   COPD exacerbation ([place]) 01/07/2015   Cough 07/05/2013   Acute sinusitis 02/12/2013   Acute bronchitis 07/08/2011   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) 01/05/2011    PCP: [name], MD  REFERRING PROVIDER: [name], MD   REFERRING DIAG: C50.411,Z17.0 (ICD-10-CM) - Malignant neoplasm of upper-outer quadrant of right breast in female, estrogen receptor positive ([place]); Increased back pain - recent lumpectomy and osteoporosis  Rationale for Evaluation and Treatment Rehabilitation  THERAPY DIAG:  Pain in thoracic spine  Cervicalgia  Abnormal posture  Muscle weakness (generalized)  Cramp and spasm  Other low back pain  ONSET DATE: 02/16/22  SUBJECTIVE:  SUBJECTIVE STATEMENT: Pt reports having bad pain on Saturday, got better yesterday, and better today.  PERTINENT HISTORY:  Patient was diagnosed on 12/27/2021 with right grade 2 invasive ductal carcinoma breast cancer.   Post Rt breast lumpectomy with no carcinoma in 4 removed nodes on 02/16/22. Started radiation therapy 03/31/22. It's Monday-Thursday for 4 weeks. Last session scheduled 04/28/22. OP, h/o vertigo, stage 4 COPD and is on 2 liters of oxygen when she is walking. Brings O2 with her.  Compression fracture T7  PAIN:  Are you having pain? No: NPRS scale: 2/10 Pain location: thoracic spine Pain description: achy Aggravating factors: radiation bed/hard surfaces; bending forward Relieving factors: heat, advil    PRECAUTIONS: Other: RECENT R LUMPECTOMY 02/16/22; undergoing radiation therapy.  Light resistance only.   WEIGHT BEARING RESTRICTIONS No  FALLS:  Has patient fallen in last 6 months? No  LIVING ENVIRONMENT: Lives  with: lives alone Lives in: House/apartment Stairs: No Has following equipment at home: None  OCCUPATION: retired  PLOF: Independent  PATIENT GOALS  Wants to move more, decrease pain   OBJECTIVE:   DIAGNOSTIC FINDINGS:  Bone Density: Site Region Measured Date Measured Age YA BMD Significant CHANGE T-score AP Spine L1-L4 10/21/2021 75.4 -3.2 0.794 g/cm2  DualFemur Total Left 10/21/2021 75.4 -2.0 0.752 g/cm2  DualFemur Total Mean 10/21/2021 75.4 -2.0 0.761 g/cm2  Left Forearm Radius 33% 10/21/2021 75.4 -3.5 0.567 g/cm2  The BMD measured at Forearm Radius 33% is 0.567 g/cm2 with a T-score of -3.5. This patient is considered osteoporotic according to [facility]) criteria.  PATIENT SURVEYS:  NDI 14/50    MUSCLE LENGTH:   POSTURE: rounded shoulders, forward head, increased thoracic kyphosis, and tight left QL creating R convex lumbar scoliosis  PALPATION: Sore in subocciptals, bil UT along upper and mid thoracic paraspinals  LUMBAR ROM:   Active  A/PROM  eval  Flexion WFL, Limited by HS  Extension WFL  Right lateral flexion Limited by tight left QL  Left lateral flexion WNL  Right rotation 50%  Left rotation 50%   (Blank rows = not tested)  THORACIC ROTATION:  limited 75% Left and R 50% limited  CERVICAL ROM:  Active  A/PROM  eval  Flexion WNL  Extension WNL  Right lateral flexion 50%  Left lateral flexion 50%  Right rotation 50%  Left rotation 50%    LOWER EXTREMITY ROM:     WFL for exam  LOWER EXTREMITY MMT:    MMT Right eval Left eval  Hip flexion 4 4  Hip extension    Hip abduction 4+ 4+  Hip adduction 4 4  Hip internal rotation    Hip external rotation    Knee flexion 4+ 4  Knee extension 4+ 4+  Ankle dorsiflexion 5 5  Ankle plantarflexion    Ankle inversion    Ankle eversion     (Blank rows = not tested)  UPPER EXTREMITY STRENGTH: WNL   TODAY'S TREATMENT  05/09/22 Therapeutic Exercise: Nustep L3x13mn - %SpO2  91% after 6 min on nustep Seated horiz ABD with red TB x 10 Seated ER with red TB x 10 Seated trunk rotation with red TB x 10 bil  Gait Training: 170 ft 2x 52 stairs reciprocal pattern rest needed halfway through %SpO2 monitored  throughout, dropped to 88% intially but back to baseline after rest  Manual Therapy: STM to B UT, cervico-thoracic paraspinals  05/05/22 Theapeutic Exercise: Nustep L3x620m Standing row with red TB x 10 Standing extension with red TB  x 10 R/L trunk rotation with red TB x 10  Mini wall push up 10x STS 10x - O2 sats 88% after - 90% after recovery  Gait training: 2x170 ft down/back - monitored O2 sats during rest breaks (2) - O2 sats remained above 90%   05/03/2022 Therapeutic Exercise: to improve strength and mobility.  Demo, verbal and tactile cues throughout for technique. Nustep L2 x 6 min, x 5 min (dyspnea on exertion but SpO2 93%, HR 115 during and after, cues for pursed lip breathing). At counter for safety: Heel/toe raises x 10 Standing hip extension x 10 bil Standing hip abduction x 10 bil Sit to stands x 10 - Sp02 87%, HR 115 - given seated rest break to recover until SpO2 94%, HR 107 Hand exercises with red theraputty Standing rows YTB x 20  Standing shoulder extension YTB x 20   Standing ER standing against doorframe 20x YTB SpO2 94%,  Standing marching x 1 min -SpO2 93%, HR 112 2MWT - 305', SpO2 93%, HR 107    PATIENT EDUCATION:  Education details: HEP updated  Person educated: Patient Education method: Explanation, Demonstration, Verbal cues, and Handouts Education comprehension: verbalized understanding and returned demonstration   HOME EXERCISE PROGRAM: Access Code: [email or web address]  ASSESSMENT:  CLINICAL IMPRESSION: Pt showed good response to treatment. Progressed cardiopulmonary endurance to improve O2 return while monitoring %SpO2 throughout session. Continued working on postural exercises reduce kyphosis and improve alignment and  progressed HEP. Provided instruction and close monitoring for VS throughout session. No complaints post session.   OBJECTIVE IMPAIRMENTS decreased ROM, decreased strength, increased muscle spasms, impaired flexibility, postural dysfunction, and pain.   ACTIVITY LIMITATIONS carrying, lifting, and bending  PARTICIPATION LIMITATIONS:  pain affects all ADLS  PERSONAL FACTORS 3+ comorbidities: Breast Cancer and current radiation tx, OP, h/o vertigo, stage 4 COPD   are also affecting patient's functional outcome.   REHAB POTENTIAL: Good  CLINICAL DECISION MAKING: Evolving/moderate complexity  EVALUATION COMPLEXITY: Low   GOALS: Goals reviewed with patient? Yes  SHORT TERM GOALS: Target date: 04/20/2022  Ind with initial HEP Baseline: Goal status: MET  2.  Balance assessed and goals set Baseline:  Goal status: MET  LONG TERM GOALS: Target date: 06/01/2022  Decreased pain by 75% or more to improve pt's QOL. Baseline:  Goal status: MET 05/03/2022 reports no back pain  2. Improved cervical rotation to [facility] to ease ADLS. Baseline:  Goal status: IN PROGRESS  3.  Improved tolerance to exercise and/or cardiovascular activity by 50% or more to improve health outcomes.  Baseline:  Goal status: IN PROGRESS  4.  Improved LE strength to 4+/5 or better to allow for improved body mechanics with ADLs. Baseline:  Goal status: IN PROGRESS  5.  Improved NDI to <= 5 demonstrating decreased disability Baseline: 14 Goal status: IN PROGRESS   PLAN: PT FREQUENCY: 2x/week  PT DURATION: 8 weeks  PLANNED INTERVENTIONS: Therapeutic exercises, Therapeutic activity, Neuromuscular re-education, Balance training, Gait training, Patient/Family education, Joint mobilization, Aquatic Therapy, Dry Needling, Spinal mobilization, Cryotherapy, Taping, and Manual therapy.  PLAN FOR NEXT SESSION:  Focus on improving cardiovascular endurance to tolerance.  Monitor O2   Artist Pais, PTA 05/09/2022,  4:22 PM

## 2022-05-10 ENCOUNTER — Encounter: Payer: Medicare Other | Admitting: Physical Therapy

## 2022-05-10 DIAGNOSIS — M810 Age-related osteoporosis without current pathological fracture: Secondary | ICD-10-CM | POA: Diagnosis not present

## 2022-05-10 DIAGNOSIS — Z8781 Personal history of (healed) traumatic fracture: Secondary | ICD-10-CM | POA: Diagnosis not present

## 2022-05-11 ENCOUNTER — Ambulatory Visit: Payer: Medicare Other

## 2022-05-11 DIAGNOSIS — M542 Cervicalgia: Secondary | ICD-10-CM | POA: Diagnosis not present

## 2022-05-11 DIAGNOSIS — R293 Abnormal posture: Secondary | ICD-10-CM

## 2022-05-11 DIAGNOSIS — R252 Cramp and spasm: Secondary | ICD-10-CM | POA: Diagnosis not present

## 2022-05-11 DIAGNOSIS — M5459 Other low back pain: Secondary | ICD-10-CM | POA: Diagnosis not present

## 2022-05-11 DIAGNOSIS — M546 Pain in thoracic spine: Secondary | ICD-10-CM | POA: Diagnosis not present

## 2022-05-11 DIAGNOSIS — M6281 Muscle weakness (generalized): Secondary | ICD-10-CM | POA: Diagnosis not present

## 2022-05-11 NOTE — Therapy (Signed)
OUTPATIENT PHYSICAL THERAPY TREATMENT   Patient Name: Yolanda Morris MRN: [id number] DOB:12/26/1945, 76 y.o., female Today's Date: 05/11/2022   PT End of Session - 05/11/22 1106     Visit Number 9    Number of Visits 12    Date for PT Re-Evaluation 06/01/22    Authorization Type MCR    PT Start Time 1015    PT Stop Time 1100    PT Time Calculation (min) 45 min    Activity Tolerance Patient tolerated treatment well    Behavior During Therapy WFL for tasks assessed/performed                  Past Medical History:  Diagnosis Date   Cancer (HCC)    skin   COPD (chronic obstructive pulmonary disease) (HCC)    DM (diabetes mellitus) (HCC)    Dyspnea    increased activity    Family history of ovarian cancer 02/03/2022   Family history of stomach cancer 02/03/2022   Genital herpes    GERD (gastroesophageal reflux disease)    Hyperlipidemia    Migraines    Osteoporosis    Unspecified essential hypertension    Past Surgical History:  Procedure Laterality Date   BREAST BIOPSY     BREAST EXCISIONAL BIOPSY     over 20 years ago- benign   BREAST LUMPECTOMY WITH RADIOACTIVE SEED AND SENTINEL LYMPH NODE BIOPSY Right 02/16/2022   Procedure: RIGHT BREAST LUMPECTOMY WITH RADIOACTIVE SEED AND SENTINEL LYMPH NODE BIOPSY;  Surgeon: Blackman, Douglas, MD;  Location: [facility] OR;  Service: General;  Laterality: Right;   BUNIONECTOMY Left 2004   and hammer toe repair Left foot   COLON SURGERY     removal of polyps   DENTAL SURGERY     RETINAL TEAR REPAIR CRYOTHERAPY     Patient Active Problem List   Diagnosis Date Noted   Genetic testing 02/09/2022   Family history of ovarian cancer 02/03/2022   Family history of stomach cancer 02/03/2022   Malignant neoplasm of upper-outer quadrant of right breast in female, estrogen receptor positive (HCC) 01/28/2022   Medication management 07/21/2020   Healthcare maintenance 07/21/2020   Body aches 07/22/2019   Chronic respiratory failure  with hypoxia (HCC) 06/17/2019   Allergic rhinitis 11/13/2018   Shortness of breath 10/23/2018   COPD exacerbation (HCC) 01/07/2015   Cough 07/05/2013   Acute sinusitis 02/12/2013   Acute bronchitis 07/08/2011   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) 01/05/2011    PCP: Smith, Candace, MD  REFERRING PROVIDER: Iruku, Praveena, MD   REFERRING DIAG: C50.411,Z17.0 (ICD-10-CM) - Malignant neoplasm of upper-outer quadrant of right breast in female, estrogen receptor positive (HCC); Increased back pain - recent lumpectomy and osteoporosis  Rationale for Evaluation and Treatment Rehabilitation  THERAPY DIAG:  Pain in thoracic spine  Cervicalgia  Abnormal posture  Muscle weakness (generalized)  Cramp and spasm  ONSET DATE: 02/16/22  SUBJECTIVE:                                                                                                                                                                                             SUBJECTIVE STATEMENT: Pt reports doing good as far as breathing goes and no pain in mid back.  PERTINENT HISTORY:  Patient was diagnosed on 12/27/2021 with right grade 2 invasive ductal carcinoma breast cancer.   Post Rt breast lumpectomy with no carcinoma in 4 removed nodes on 02/16/22. Started radiation therapy 03/31/22. It's Monday-Thursday for 4 weeks. Last session scheduled 04/28/22. OP, h/o vertigo, stage 4 COPD and is on 2 liters of oxygen when she is walking. Brings O2 with her.  Compression fracture T7  PAIN:  Are you having pain? No: NPRS scale: 0/10 Pain location: thoracic spine Pain description: achy Aggravating factors: radiation bed/hard surfaces; bending forward Relieving factors: heat, advil    PRECAUTIONS: Other: RECENT R LUMPECTOMY 02/16/22; undergoing radiation therapy.  Light resistance only.   WEIGHT BEARING RESTRICTIONS No  FALLS:  Has patient fallen in last 6 months? No  LIVING ENVIRONMENT: Lives with: lives alone Lives in:  House/apartment Stairs: No Has following equipment at home: None  OCCUPATION: retired  PLOF: Independent  PATIENT GOALS  Wants to move more, decrease pain   OBJECTIVE:   DIAGNOSTIC FINDINGS:  Bone Density: Site Region Measured Date Measured Age YA BMD Significant CHANGE T-score AP Spine L1-L4 10/21/2021 75.4 -3.2 0.794 g/cm2  DualFemur Total Left 10/21/2021 75.4 -2.0 0.752 g/cm2  DualFemur Total Mean 10/21/2021 75.4 -2.0 0.761 g/cm2  Left Forearm Radius 33% 10/21/2021 75.4 -3.5 0.567 g/cm2  The BMD measured at Forearm Radius 33% is 0.567 g/cm2 with a T-score of -3.5. This patient is considered osteoporotic according to [facility]) criteria.  PATIENT SURVEYS:  NDI 14/50    MUSCLE LENGTH:   POSTURE: rounded shoulders, forward head, increased thoracic kyphosis, and tight left QL creating R convex lumbar scoliosis  PALPATION: Sore in subocciptals, bil UT along upper and mid thoracic paraspinals  LUMBAR ROM:   Active  A/PROM  eval  Flexion WFL, Limited by HS  Extension WFL  Right lateral flexion Limited by tight left QL  Left lateral flexion WNL  Right rotation 50%  Left rotation 50%   (Blank rows = not tested)  THORACIC ROTATION:  limited 75% Left and R 50% limited  CERVICAL ROM:  Active  A/PROM  eval AROM 05/11/22  Flexion WNL WNL  Extension WNL WNL  Right lateral flexion 50% 75%  Left lateral flexion 50% 75%  Right rotation 50% 70%  Left rotation 50% 75%    LOWER EXTREMITY ROM:     WFL for exam  LOWER EXTREMITY MMT:    MMT Right eval Left eval  Hip flexion 4 4  Hip extension    Hip abduction 4+ 4+  Hip adduction 4 4  Hip internal rotation    Hip external rotation    Knee flexion 4+ 4  Knee extension 4+ 4+  Ankle dorsiflexion 5 5  Ankle plantarflexion    Ankle inversion    Ankle eversion     (Blank rows = not tested)  UPPER EXTREMITY STRENGTH: WNL   TODAY'S TREATMENT  05/11/22 Therapeutic Exercise: Nustep  L3x65mn - %SpO2 91% @ 5:30 and after 8 min Standing row with RTB 2x15 reps Standing shld extension with RTB x 15 Pallof press x 10 RTB bil Multifidus walkout with RTB x 10 bil Wall angels x 10 bil  Manual Therapy: STM to B UT, cervico-thoracic paraspinals  05/09/22 Therapeutic Exercise: Nustep L3x878m - %SpO2 91% after 6 min on nustep Seated horiz ABD with red TB x 10 Seated ER with red TB x  10 Seated trunk rotation with red TB x 10 bil  Gait Training: 170 ft 2x 52 stairs reciprocal pattern rest needed halfway through %SpO2 monitored  throughout, dropped to 88% intially but back to baseline after rest  Manual Therapy: STM to B UT, cervico-thoracic paraspinals  05/05/22 Theapeutic Exercise: Nustep L3x74mn Standing row with red TB x 10 Standing extension with red TB x 10 R/L trunk rotation with red TB x 10  Mini wall push up 10x STS 10x - O2 sats 88% after - 90% after recovery  Gait training: 2x170 ft down/back - monitored O2 sats during rest breaks (2) - O2 sats remained above 90%       PATIENT EDUCATION:  Education details: HEP updated  Person educated: Patient Education method: EConsulting civil engineer Demonstration, Verbal cues, and Handouts Education comprehension: verbalized understanding and returned demonstration   HOME EXERCISE PROGRAM: Access Code: [email or web address] ASSESSMENT:  CLINICAL IMPRESSION: Pt is progressing with cervical ROM, she does report some limitations with driving turing her head to check blind spots. Continued to work on postural strengthening and cardiopulmonary endurance, monitoring O2 sats throughout and emphasizing scapular retraction + depression. Pt was able to increase resistance to red TB w/ no increased pain. Finished session with MT to upper back and neck to reduce muscle tension and stiffness.   OBJECTIVE IMPAIRMENTS decreased ROM, decreased strength, increased muscle spasms, impaired flexibility, postural dysfunction, and pain.   ACTIVITY  LIMITATIONS carrying, lifting, and bending  PARTICIPATION LIMITATIONS:  pain affects all ADLS  PERSONAL FACTORS 3+ comorbidities: Breast Cancer and current radiation tx, OP, h/o vertigo, stage 4 COPD   are also affecting patient's functional outcome.   REHAB POTENTIAL: Good  CLINICAL DECISION MAKING: Evolving/moderate complexity  EVALUATION COMPLEXITY: Low   GOALS: Goals reviewed with patient? Yes  SHORT TERM GOALS: Target date: 04/20/2022  Ind with initial HEP Baseline: Goal status: MET  2.  Balance assessed and goals set Baseline:  Goal status: MET  LONG TERM GOALS: Target date: 06/01/2022  Decreased pain by 75% or more to improve pt's QOL. Baseline:  Goal status: MET 05/03/2022 reports no back pain  2. Improved [facility] to [facility] ease ADLS. Baseline:  Goal status: IN PROGRESS   3.  Improved tolerance to exercise and/or cardiovascular activity by 50% or more to improve health outcomes.  Baseline:  Goal status: IN PROGRESS  4.  Improved LE strength to 4+/5 or better to allow for improved body mechanics with ADLs. Baseline:  Goal status: IN PROGRESS  5.  Improved NDI to <= 5 demonstrating decreased disability Baseline: 14 Goal status: IN PROGRESS   PLAN: PT FREQUENCY: 2x/week  PT DURATION: 8 weeks  PLANNED INTERVENTIONS: Therapeutic exercises, Therapeutic activity, Neuromuscular re-education, Balance training, Gait training, Patient/Family education, Joint mobilization, Aquatic Therapy, Dry Needling, Spinal mobilization, Cryotherapy, Taping, and Manual therapy.  PLAN FOR NEXT SESSION:  Focus on improving cardiovascular endurance to tolerance.  Monitor O2   BArtist Pais PTA 05/11/2022, 11:06 AM

## 2022-05-15 DIAGNOSIS — E1169 Type 2 diabetes mellitus with other specified complication: Secondary | ICD-10-CM | POA: Diagnosis not present

## 2022-05-17 ENCOUNTER — Ambulatory Visit: Payer: Medicare Other | Attending: Hematology and Oncology | Admitting: Physical Therapy

## 2022-05-17 ENCOUNTER — Encounter: Payer: Self-pay | Admitting: Physical Therapy

## 2022-05-17 DIAGNOSIS — M542 Cervicalgia: Secondary | ICD-10-CM | POA: Insufficient documentation

## 2022-05-17 DIAGNOSIS — M546 Pain in thoracic spine: Secondary | ICD-10-CM | POA: Insufficient documentation

## 2022-05-17 DIAGNOSIS — M5459 Other low back pain: Secondary | ICD-10-CM | POA: Insufficient documentation

## 2022-05-17 DIAGNOSIS — R252 Cramp and spasm: Secondary | ICD-10-CM | POA: Insufficient documentation

## 2022-05-17 DIAGNOSIS — M6281 Muscle weakness (generalized): Secondary | ICD-10-CM | POA: Diagnosis not present

## 2022-05-17 DIAGNOSIS — R293 Abnormal posture: Secondary | ICD-10-CM | POA: Insufficient documentation

## 2022-05-17 NOTE — Therapy (Signed)
OUTPATIENT PHYSICAL THERAPY TREATMENT Progress Note Reporting Period 04/06/2022 to 05/17/2022  See note below for Objective Data and Assessment of Progress/Goals.      Patient Name: Yolanda Morris MRN: [id number] DOB:10/31/1945, 76 y.o., female Today's Date: 05/17/2022   PT End of Session - 05/17/22 0807     Visit Number 10    Number of Visits 13    Date for PT Re-Evaluation 06/01/22    Authorization Type MCR    Progress Note Due on Visit 10    PT Start Time 0804    PT Stop Time 0850    PT Time Calculation (min) 46 min    Activity Tolerance Patient tolerated treatment well    Behavior During Therapy WFL for tasks assessed/performed                  Past Medical History:  Diagnosis Date   Cancer (HCC)    skin   COPD (chronic obstructive pulmonary disease) (HCC)    DM (diabetes mellitus) (HCC)    Dyspnea    increased activity    Family history of ovarian cancer 02/03/2022   Family history of stomach cancer 02/03/2022   Genital herpes    GERD (gastroesophageal reflux disease)    Hyperlipidemia    Migraines    Osteoporosis    Unspecified essential hypertension    Past Surgical History:  Procedure Laterality Date   BREAST BIOPSY     BREAST EXCISIONAL BIOPSY     over 20 years ago- benign   BREAST LUMPECTOMY WITH RADIOACTIVE SEED AND SENTINEL LYMPH NODE BIOPSY Right 02/16/2022   Procedure: RIGHT BREAST LUMPECTOMY WITH RADIOACTIVE SEED AND SENTINEL LYMPH NODE BIOPSY;  Surgeon: Blackman, Douglas, MD;  Location: [facility] OR;  Service: General;  Laterality: Right;   BUNIONECTOMY Left 2004   and hammer toe repair Left foot   COLON SURGERY     removal of polyps   DENTAL SURGERY     RETINAL TEAR REPAIR CRYOTHERAPY     Patient Active Problem List   Diagnosis Date Noted   Genetic testing 02/09/2022   Family history of ovarian cancer 02/03/2022   Family history of stomach cancer 02/03/2022   Malignant neoplasm of upper-outer quadrant of right breast in female, estrogen  receptor positive (HCC) 01/28/2022   Medication management 07/21/2020   Healthcare maintenance 07/21/2020   Body aches 07/22/2019   Chronic respiratory failure with hypoxia (HCC) 06/17/2019   Allergic rhinitis 11/13/2018   Shortness of breath 10/23/2018   COPD exacerbation (HCC) 01/07/2015   Cough 07/05/2013   Acute sinusitis 02/12/2013   Acute bronchitis 07/08/2011   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) 01/05/2011    PCP: Smith, Candace, MD  REFERRING PROVIDER: Iruku, Praveena, MD   REFERRING DIAG: C50.411,Z17.0 (ICD-10-CM) - Malignant neoplasm of upper-outer quadrant of right breast in female, estrogen receptor positive (HCC); Increased back pain - recent lumpectomy and osteoporosis  Rationale for Evaluation and Treatment Rehabilitation  THERAPY DIAG:  Pain in thoracic spine  Cervicalgia  Abnormal posture  Muscle weakness (generalized)  Cramp and spasm  Other low back pain  ONSET DATE: 02/16/22  SUBJECTIVE:                                                                                                                                                                                             SUBJECTIVE STATEMENT: Pt reports doing well this morning, didn't do her breathing treatment yet and notices a difference.  No pain in mid back.  Only pain from radiation burn under arm.   PERTINENT HISTORY:  Patient was diagnosed on 12/27/2021 with right grade 2 invasive ductal carcinoma breast cancer.   Post Rt breast lumpectomy with no carcinoma in 4 removed nodes on 02/16/22. Started radiation therapy 03/31/22. It's Monday-Thursday for 4 weeks. Last session scheduled 04/28/22. OP, h/o vertigo, stage 4 COPD and is on 2 liters of oxygen when she is walking. Brings O2 with her.  Compression fracture T7  PAIN:  Are you having pain? No: NPRS scale: 0/10 Pain location: thoracic spine Pain description: achy Aggravating factors: radiation bed/hard surfaces; bending forward Relieving  factors: heat, advil   PRECAUTIONS: Other: RECENT R LUMPECTOMY 02/16/22; undergoing radiation therapy.  Light resistance only.   WEIGHT BEARING RESTRICTIONS No  FALLS:  Has patient fallen in last 6 months? No  LIVING ENVIRONMENT: Lives with: lives alone Lives in: House/apartment Stairs: No Has following equipment at home: None  OCCUPATION: retired  PLOF: Independent  PATIENT GOALS  Wants to move more, decrease pain   OBJECTIVE:   DIAGNOSTIC FINDINGS:  Bone Density: Site Region Measured Date Measured Age YA BMD Significant CHANGE T-score AP Spine L1-L4 10/21/2021 75.4 -3.2 0.794 g/cm2  DualFemur Total Left 10/21/2021 75.4 -2.0 0.752 g/cm2  DualFemur Total Mean 10/21/2021 75.4 -2.0 0.761 g/cm2  Left Forearm Radius 33% 10/21/2021 75.4 -3.5 0.567 g/cm2  The BMD measured at Forearm Radius 33% is 0.567 g/cm2 with a T-score of -3.5. This patient is considered osteoporotic according to World Health Organization (WHO) criteria.  PATIENT SURVEYS:  NDI 14/50   MCID = 7.5 points. 05/17/2022- 7/50   MUSCLE LENGTH:   POSTURE: rounded shoulders, forward head, increased thoracic kyphosis, and tight left QL creating R convex lumbar scoliosis  PALPATION: Sore in subocciptals, bil UT along upper and mid thoracic paraspinals  LUMBAR ROM:   Active  AROM  eval 05/17/2022  Flexion WFL, Limited by HS To ankles  Extension WFL WFL  Right lateral flexion Limited by tight left QL To knee  Left lateral flexion WNL To knee  Right rotation 50% WFL  Left rotation 50% WFL   (Blank rows = not tested)  THORACIC ROTATION:  limited 75% Left and R 50% limited  CERVICAL ROM:  Active  A/PROM  eval AROM 05/11/22 AROM  05/17/22  Flexion WNL WNL 40  Extension WNL WNL 45  Right lateral flexion 50% 75% 40  Left lateral flexion 50% 75% 35 *  Right rotation 50% 70% 75  Left rotation 50% 75% 80    LOWER EXTREMITY ROM:     WFL for exam  LOWER EXTREMITY MMT:    MMT Right eval Left eval Right   05/17/22 Left 05/17/2022  Hip flexion 4 4 4+ 4+  Hip extension      Hip abduction 4+ 4+ 5 5  Hip adduction 4 4 4+ 4+  Hip internal rotation      Hip external rotation      Knee flexion 4+ 4 5 5  Knee extension 4+ 4+ 5 5  Ankle dorsiflexion 5 5    Ankle plantarflexion      Ankle inversion      Ankle eversion       (Blank rows = not tested)  UPPER EXTREMITY STRENGTH: WNL   TODAY'S TREATMENT  05/17/2022 Therapeutic Exercise: to improve strength and mobility.  Demo, verbal   and tactile cues throughout for technique. UBE L1 4 min f/2 min back  O2 97%, HR 118 Cybex rows 10# 1 x 10 Cybex knee extension 10# 1 x 10 Cybex knee flexion 15# 1 x 10 O2 93%, HR 88 Cybex leg press 15# 1 x 10, toe press 15# 1 x 10 O2 90%, HR 88 Squats x 10  Physical Performance: to assess progress towards goals.  Cervical ROM, lumbar ROM, MMT, NDI.  See objective.   05/11/22 Therapeutic Exercise: Nustep L3x8min - %SpO2 91% @ 5:30 and after 8 min Standing row with RTB 2x15 reps Standing shld extension with RTB x 15 Pallof press x 10 RTB bil Multifidus walkout with RTB x 10 bil Wall angels x 10 bil  Manual Therapy: STM to B UT, cervico-thoracic paraspinals  05/09/22 Therapeutic Exercise: Nustep L3x8min - %SpO2 91% after 6 min on nustep Seated horiz ABD with red TB x 10 Seated ER with red TB x 10 Seated trunk rotation with red TB x 10 bil  Gait Training: 170 ft 2x 52 stairs reciprocal pattern rest needed halfway through %SpO2 monitored  throughout, dropped to 88% intially but back to baseline after rest  Manual Therapy: STM to B UT, cervico-thoracic paraspinals  05/05/22 Theapeutic Exercise: Nustep L3x6min Standing row with red TB x 10 Standing extension with red TB x 10 R/L trunk rotation with red TB x 10  Mini wall push up 10x STS 10x - O2 sats 88% after - 90% after recovery  Gait training: 2x170 ft down/back - monitored O2 sats during rest breaks (2) - O2 sats remained above 90%        PATIENT EDUCATION:  Education details: HEP updated  Person educated: Patient Education method: Explanation, Demonstration, Verbal cues, and Handouts Education comprehension: verbalized understanding and returned demonstration   HOME EXERCISE PROGRAM: Access Code: [email or web address]  ASSESSMENT:  CLINICAL IMPRESSION: Yolanda Morris is making good progress overall.  She demonstrates improved bil LE strength, and improved cervical and lumbar ROM, with no pain today with movements.  She has met LTG #1,2, and #4.  Her NDI has improved 7 points (MCID = 7.5 points).  Today focused on introducing more resistance with machines, focusing on LE strengthening.  She did not have shortness of breath with machines, and O2 level remained above 92%, with exception of leg press, but this may be due to taking vitals after standing up, which she reports tends to lower O2, no shortness of breath with exercise.  After short seated rest break, SpO2 returned to 95%.  Focus of remaining sessions will be on exercise to help strengthen and improve endurance, and enable transition to community based exercise program.  Yolanda Morris continues to demonstrate potential for improvement and would benefit from continued skilled therapy to address impairments.      OBJECTIVE IMPAIRMENTS decreased ROM, decreased strength, increased muscle spasms, impaired flexibility, postural dysfunction, and pain.   ACTIVITY LIMITATIONS carrying, lifting, and bending  PARTICIPATION LIMITATIONS:  pain affects all ADLS  PERSONAL FACTORS 3+ comorbidities: Breast Cancer and current radiation tx, OP, h/o vertigo, stage 4 COPD   are also affecting patient's functional outcome.   REHAB POTENTIAL: Good  CLINICAL DECISION MAKING: Evolving/moderate complexity  EVALUATION COMPLEXITY: Low   GOALS: Goals reviewed with patient? Yes  SHORT TERM GOALS: Target date: 04/20/2022  Ind with initial HEP Baseline: Goal status: MET  2.   Balance assessed and goals set Baseline:  Goal status: MET  LONG TERM GOALS: Target date: 06/01/2022    Decreased pain by 75% or more to improve pt's QOL. Baseline:  Goal status: MET 05/03/2022 reports no back pain  2. Improved cervical rotation to WFL to ease ADLS. Baseline:  Goal status: MET 05/17/2022 - see objective   3.  Improved tolerance to exercise and/or cardiovascular activity by 50% or more to improve health outcomes.  Baseline:  Goal status: IN PROGRESS 05/17/2022 -25% improvement.   4.  Improved LE strength to 4+/5 or better to allow for improved body mechanics with ADLs. Baseline:  Goal status: MET 05/17/2022 - see objective   5.  Improved NDI to <= 5 demonstrating decreased disability Baseline: 14 Goal status: IN PROGRESS 05/17/22- 7/50   PLAN: PT FREQUENCY: 2x/week  PT DURATION: 8 weeks  PLANNED INTERVENTIONS: Therapeutic exercises, Therapeutic activity, Neuromuscular re-education, Balance training, Gait training, Patient/Family education, Joint mobilization, Aquatic Therapy, Dry Needling, Spinal mobilization, Cryotherapy, Taping, and Manual therapy.  PLAN FOR NEXT SESSION:  Focus on improving cardiovascular endurance to tolerance.  Monitor O2   Elizabeth J Whitman, PT, DPT  05/17/2022, 9:08 AM  

## 2022-05-20 ENCOUNTER — Ambulatory Visit: Payer: Medicare Other | Admitting: Physical Therapy

## 2022-05-20 ENCOUNTER — Encounter: Payer: Self-pay | Admitting: Physical Therapy

## 2022-05-20 DIAGNOSIS — R252 Cramp and spasm: Secondary | ICD-10-CM | POA: Diagnosis not present

## 2022-05-20 DIAGNOSIS — M546 Pain in thoracic spine: Secondary | ICD-10-CM

## 2022-05-20 DIAGNOSIS — R293 Abnormal posture: Secondary | ICD-10-CM

## 2022-05-20 DIAGNOSIS — M6281 Muscle weakness (generalized): Secondary | ICD-10-CM | POA: Diagnosis not present

## 2022-05-20 DIAGNOSIS — M5459 Other low back pain: Secondary | ICD-10-CM | POA: Diagnosis not present

## 2022-05-20 DIAGNOSIS — M542 Cervicalgia: Secondary | ICD-10-CM

## 2022-05-20 NOTE — Therapy (Signed)
OUTPATIENT PHYSICAL THERAPY TREATMENT Progress Note Reporting Period 04/06/2022 to 05/17/2022  See note below for Objective Data and Assessment of Progress/Goals.      Patient Name: Yolanda Morris MRN: [id number] DOB:1946/09/02, 76 y.o., female Today's Date: 05/20/2022   PT End of Session - 05/20/22 0802     Visit Number 11    Number of Visits 13    Date for PT Re-Evaluation 06/01/22    Authorization Type MCR    Progress Note Due on Visit 10    PT Start Time 0803    PT Stop Time 0845    PT Time Calculation (min) 42 min    Activity Tolerance Patient tolerated treatment well    Behavior During Therapy [facility] for tasks assessed/performed                  Past Medical History:  Diagnosis Date   Cancer ([place])    skin   COPD (chronic obstructive pulmonary disease) ([place])    DM (diabetes mellitus) ([place])    Dyspnea    increased activity    Family history of ovarian cancer 02/03/2022   Family history of stomach cancer 02/03/2022   Genital herpes    GERD (gastroesophageal reflux disease)    Hyperlipidemia    Migraines    Osteoporosis    Unspecified essential hypertension    Past Surgical History:  Procedure Laterality Date   BREAST BIOPSY     BREAST EXCISIONAL BIOPSY     over 20 years ago- benign   BREAST LUMPECTOMY WITH RADIOACTIVE SEED AND SENTINEL LYMPH NODE BIOPSY Right 02/16/2022   Procedure: RIGHT BREAST LUMPECTOMY WITH RADIOACTIVE SEED AND SENTINEL LYMPH NODE BIOPSY;  Surgeon: [name], MD;  Location: [place];  Service: General;  Laterality: Right;   BUNIONECTOMY Left 2004   and hammer toe repair Left foot   COLON SURGERY     removal of polyps   [place]     Patient Active Problem List   Diagnosis Date Noted   Genetic testing 02/09/2022   Family history of ovarian cancer 02/03/2022   Family history of stomach cancer 02/03/2022   Malignant neoplasm of upper-outer quadrant of right breast in female, estrogen  receptor positive ([place]) 01/28/2022   Medication management 07/21/2020   Healthcare maintenance 07/21/2020   Body aches 07/22/2019   Chronic respiratory failure with hypoxia ([place]) 06/17/2019   Allergic rhinitis 11/13/2018   Shortness of breath 10/23/2018   COPD exacerbation ([place]) 01/07/2015   Cough 07/05/2013   Acute sinusitis 02/12/2013   Acute bronchitis 07/08/2011   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) 01/05/2011    PCP: [name], MD  REFERRING PROVIDER: [name], MD   REFERRING DIAG: C50.411,Z17.0 (ICD-10-CM) - Malignant neoplasm of upper-outer quadrant of right breast in female, estrogen receptor positive ([place]); Increased back pain - recent lumpectomy and osteoporosis  Rationale for Evaluation and Treatment Rehabilitation  THERAPY DIAG:  Pain in thoracic spine  Cervicalgia  Abnormal posture  Muscle weakness (generalized)  Cramp and spasm  Other low back pain  ONSET DATE: 02/16/22  SUBJECTIVE:  SUBJECTIVE STATEMENT: Pt reports doing well this morning, no pain, arm is healing from radiation burn.    PERTINENT HISTORY:  Patient was diagnosed on 12/27/2021 with right grade 2 invasive ductal carcinoma breast cancer.   Post Rt breast lumpectomy with no carcinoma in 4 removed nodes on 02/16/22. Started radiation therapy 03/31/22. It's Monday-Thursday for 4 weeks. Last session scheduled 04/28/22. OP, h/o vertigo, stage 4 COPD and is on 2 liters of oxygen when she is walking. Brings O2 with her.  Compression fracture T7  PAIN:  Are you having pain? No: NPRS scale: 0/10 Pain location: thoracic spine Pain description: achy Aggravating factors: radiation bed/hard surfaces; bending forward Relieving factors: heat, advil   PRECAUTIONS: Other: RECENT R LUMPECTOMY 02/16/22;  undergoing radiation therapy.  Light resistance only.   WEIGHT BEARING RESTRICTIONS No  FALLS:  Has patient fallen in last 6 months? No  LIVING ENVIRONMENT: Lives with: lives alone Lives in: House/apartment Stairs: No Has following equipment at home: None  OCCUPATION: retired  PLOF: Independent  PATIENT GOALS  Wants to move more, decrease pain   OBJECTIVE:   DIAGNOSTIC FINDINGS:  Bone Density: Site Region Measured Date Measured Age YA BMD Significant CHANGE T-score AP Spine L1-L4 10/21/2021 75.4 -3.2 0.794 g/cm2  DualFemur Total Left 10/21/2021 75.4 -2.0 0.752 g/cm2  DualFemur Total Mean 10/21/2021 75.4 -2.0 0.761 g/cm2  Left Forearm Radius 33% 10/21/2021 75.4 -3.5 0.567 g/cm2  The BMD measured at Forearm Radius 33% is 0.567 g/cm2 with a T-score of -3.5. This patient is considered osteoporotic according to [facility]) criteria.  PATIENT SURVEYS:  NDI 14/50   MCID = 7.5 points. 05/17/2022- 7/50   MUSCLE LENGTH:   POSTURE: rounded shoulders, forward head, increased thoracic kyphosis, and tight left QL creating R convex lumbar scoliosis  PALPATION: Sore in subocciptals, bil UT along upper and mid thoracic paraspinals  LUMBAR ROM:   Active  AROM  eval 05/17/2022  Flexion WFL, Limited by HS To ankles  Extension [facility] WFL  Right lateral flexion Limited by tight left QL To knee  Left lateral flexion WNL To knee  Right rotation 50% WFL  Left rotation 50% WFL   (Blank rows = not tested)  THORACIC ROTATION:  limited 75% Left and R 50% limited  CERVICAL ROM:  Active  A/PROM  eval AROM 05/11/22 AROM  05/17/22  Flexion WNL WNL 40  Extension WNL WNL 45  Right lateral flexion 50% 75% 40  Left lateral flexion 50% 75% 35 *  Right rotation 50% 70% 75  Left rotation 50% 75% 80    LOWER EXTREMITY ROM:     WFL for exam  LOWER EXTREMITY MMT:    MMT Right eval Left eval Right  05/17/22 Left 05/17/2022  Hip flexion 4 4 4+ 4+  Hip extension      Hip  abduction 4+ 4+ 5 5  Hip adduction 4 4 4+ 4+  Hip internal rotation      Hip external rotation      Knee flexion 4+ 4 5 5   Knee extension 4+ 4+ 5 5  Ankle dorsiflexion 5 5    Ankle plantarflexion      Ankle inversion      Ankle eversion       (Blank rows = not tested)  UPPER EXTREMITY STRENGTH: WNL   TODAY'S TREATMENT  05/20/2022 Therapeutic Exercise: to improve strength and mobility.  Monitoring vitals and tolerance throughout.  Cues for breathing throughout and pacing.  Nustep L3 x 4  min (SpO2 90%, HR 118, allowed to rest until 93%) , x 3 min directed to do slower pace (SpO2 92%,HR 105) Cybex knee extension 15# 2 x 10 SpO2 95%, HR 105  Cybex knee flexion 15# 1 x 15 SpO2 93%, HR 105 Cybex leg press 10# 1 x 10  (SpO2 93%, HR 96) 1 x 15# (O2 90%, HR 90 - patient was talking  more and less focused on breathing this set).  Deep breathing exercises - SpO2 95% afterwards.  2MWT (Sp02 93%, HR 85 at start, afterwards SpO2 89%, HR 105) - 312' ft   05/17/2022 Therapeutic Exercise: to improve strength and mobility.  Demo, verbal and tactile cues throughout for technique. UBE L1 4 min f/2 min back  O2 97%, HR 118 Cybex rows 10# 1 x 10 Cybex knee extension 10# 1 x 10 Cybex knee flexion 15# 1 x 10 O2 93%, HR 88 Cybex leg press 15# 1 x 10, toe press 15# 1 x 10 O2 90%, HR 88 Squats x 10  Physical Performance: to assess progress towards goals.  Cervical ROM, lumbar ROM, MMT, NDI.  See objective.   05/11/22 Therapeutic Exercise: Nustep L3x35mn - %SpO2 91% @ 5:30 and after 8 min Standing row with RTB 2x15 reps Standing shld extension with RTB x 15 Pallof press x 10 RTB bil Multifidus walkout with RTB x 10 bil Wall angels x 10 bil  Manual Therapy: STM to B UT, cervico-thoracic paraspinals   PATIENT EDUCATION:  Education details: HEP updated  Person educated: Patient Education method: Explanation, Demonstration, Verbal cues, and Handouts Education comprehension: verbalized understanding and  returned demonstration   HOME EXERCISE PROGRAM: Access Code: [email or web address]  ASSESSMENT:  CLINICAL IMPRESSION: Continued focus on LE strengthening today with resistance machines.  Monitored vitals throughout, SpO2 remained higher when focusing on breathing and not talking.  She participated in 2MWT today, and SpO2 at end was 89%.  DNiel Hummercontinues to demonstrate potential for improvement and would benefit from continued skilled therapy to address impairments.      OBJECTIVE IMPAIRMENTS decreased ROM, decreased strength, increased muscle spasms, impaired flexibility, postural dysfunction, and pain.   ACTIVITY LIMITATIONS carrying, lifting, and bending  PARTICIPATION LIMITATIONS:  pain affects all ADLS  PERSONAL FACTORS 3+ comorbidities: Breast Cancer and current radiation tx, OP, h/o vertigo, stage 4 COPD   are also affecting patient's functional outcome.   REHAB POTENTIAL: Good  CLINICAL DECISION MAKING: Evolving/moderate complexity  EVALUATION COMPLEXITY: Low   GOALS: Goals reviewed with patient? Yes  SHORT TERM GOALS: Target date: 04/20/2022  Ind with initial HEP Baseline: Goal status: MET  2.  Balance assessed and goals set Baseline:  Goal status: MET  LONG TERM GOALS: Target date: 06/01/2022  Decreased pain by 75% or more to improve pt's QOL. Baseline:  Goal status: MET 05/03/2022 reports no back pain  2. [facility]. Baseline:  Goal status: MET 05/17/2022 - see objective   3.  Improved tolerance to exercise and/or cardiovascular activity by 50% or more to improve health outcomes.  Baseline:  Goal status: IN PROGRESS 05/17/2022 -25% improvement.   4.  Improved LE strength to 4+/5 or better to allow for improved body mechanics with ADLs. Baseline:  Goal status: MET 05/17/2022 - see objective   5.  Improved NDI to <= 5 demonstrating decreased disability Baseline: 14 Goal status: IN PROGRESS 05/17/22- 7/50   PLAN: PT  FREQUENCY: 2x/week  PT DURATION: 8 weeks  PLANNED INTERVENTIONS: Therapeutic  exercises, Therapeutic activity, Neuromuscular re-education, Balance training, Gait training, Patient/Family education, Joint mobilization, Aquatic Therapy, Dry Needling, Spinal mobilization, Cryotherapy, Taping, and Manual therapy.  PLAN FOR NEXT SESSION:  Focus on improving cardiovascular endurance to tolerance.  Monitor O2   Rennie Natter, [place], DPT  05/20/2022, 8:52 AM

## 2022-05-23 DIAGNOSIS — I10 Essential (primary) hypertension: Secondary | ICD-10-CM | POA: Diagnosis not present

## 2022-05-23 DIAGNOSIS — E119 Type 2 diabetes mellitus without complications: Secondary | ICD-10-CM | POA: Diagnosis not present

## 2022-05-23 DIAGNOSIS — E785 Hyperlipidemia, unspecified: Secondary | ICD-10-CM | POA: Diagnosis not present

## 2022-05-23 DIAGNOSIS — J449 Chronic obstructive pulmonary disease, unspecified: Secondary | ICD-10-CM | POA: Diagnosis not present

## 2022-05-24 ENCOUNTER — Ambulatory Visit: Payer: Medicare Other

## 2022-05-24 ENCOUNTER — Telehealth: Payer: Self-pay | Admitting: Adult Health

## 2022-05-24 DIAGNOSIS — M546 Pain in thoracic spine: Secondary | ICD-10-CM | POA: Diagnosis not present

## 2022-05-24 DIAGNOSIS — M542 Cervicalgia: Secondary | ICD-10-CM

## 2022-05-24 DIAGNOSIS — M5459 Other low back pain: Secondary | ICD-10-CM

## 2022-05-24 DIAGNOSIS — R293 Abnormal posture: Secondary | ICD-10-CM

## 2022-05-24 DIAGNOSIS — R252 Cramp and spasm: Secondary | ICD-10-CM | POA: Diagnosis not present

## 2022-05-24 DIAGNOSIS — M6281 Muscle weakness (generalized): Secondary | ICD-10-CM | POA: Diagnosis not present

## 2022-05-24 NOTE — Telephone Encounter (Signed)
Scheduled per 8/8 in basket, pt has been called and confirmed

## 2022-05-24 NOTE — Therapy (Signed)
OUTPATIENT PHYSICAL THERAPY TREATMENT      Patient Name: TANJI STORRS MRN: [id number] DOB:[birth year], 76 y.o., female Today's Date: 05/24/2022   PT End of Session - 05/24/22 1018     Visit Number 12    Number of Visits 13    Date for PT Re-Evaluation 06/01/22    Authorization Type MCR    Progress Note Due on Visit 10    PT Start Time 0933    PT Stop Time 3338    PT Time Calculation (min) 42 min    Activity Tolerance Patient tolerated treatment well    Behavior During Therapy WFL for tasks assessed/performed                   Past Medical History:  Diagnosis Date   Cancer ([place])    skin   COPD (chronic obstructive pulmonary disease) ([place])    DM (diabetes mellitus) ([place])    Dyspnea    increased activity    Family history of ovarian cancer 02/03/2022   Family history of stomach cancer 02/03/2022   Genital herpes    GERD (gastroesophageal reflux disease)    Hyperlipidemia    Migraines    Osteoporosis    Unspecified essential hypertension    Past Surgical History:  Procedure Laterality Date   BREAST BIOPSY     BREAST EXCISIONAL BIOPSY     over 20 years ago- benign   BREAST LUMPECTOMY WITH RADIOACTIVE SEED AND SENTINEL LYMPH NODE BIOPSY Right 02/16/2022   Procedure: RIGHT BREAST LUMPECTOMY WITH RADIOACTIVE SEED AND SENTINEL LYMPH NODE BIOPSY;  Surgeon: [name], MD;  Location: [place];  Service: General;  Laterality: Right;   BUNIONECTOMY Left 2004   and hammer toe repair Left foot   COLON SURGERY     removal of polyps   [place]     Patient Active Problem List   Diagnosis Date Noted   Genetic testing 02/09/2022   Family history of ovarian cancer 02/03/2022   Family history of stomach cancer 02/03/2022   Malignant neoplasm of upper-outer quadrant of right breast in female, estrogen receptor positive ([place]) 01/28/2022   Medication management 07/21/2020   Healthcare maintenance 07/21/2020   Body aches  07/22/2019   Chronic respiratory failure with hypoxia ([place]) 06/17/2019   Allergic rhinitis 11/13/2018   Shortness of breath 10/23/2018   COPD exacerbation ([place]) 01/07/2015   Cough 07/05/2013   Acute sinusitis 02/12/2013   Acute bronchitis 07/08/2011   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) 01/05/2011    PCP: [name], MD  REFERRING PROVIDER: [name], MD   REFERRING DIAG: C50.411,Z17.0 (ICD-10-CM) - Malignant neoplasm of upper-outer quadrant of right breast in female, estrogen receptor positive ([place]); Increased back pain - recent lumpectomy and osteoporosis  Rationale for Evaluation and Treatment Rehabilitation  THERAPY DIAG:  Pain in thoracic spine  Cervicalgia  Abnormal posture  Muscle weakness (generalized)  Cramp and spasm  Other low back pain  ONSET DATE: 02/16/22  SUBJECTIVE:  SUBJECTIVE STATEMENT: Breathing is a little off today and around the shld blade is sore from cleaning yesterday.  PERTINENT HISTORY:  Patient was diagnosed on 12/27/2021 with right grade 2 invasive ductal carcinoma breast cancer.   Post Rt breast lumpectomy with no carcinoma in 4 removed nodes on 02/16/22. Started radiation therapy 03/31/22. It's Monday-Thursday for 4 weeks. Last session scheduled 04/28/22. OP, h/o vertigo, stage 4 COPD and is on 2 liters of oxygen when she is walking. Brings O2 with her.  Compression fracture T7  PAIN:  Are you having pain? No: NPRS scale: 3/10 Pain location: thoracic spine Pain description: achy Aggravating factors: radiation bed/hard surfaces; bending forward Relieving factors: heat, advil   PRECAUTIONS: Other: RECENT R LUMPECTOMY 02/16/22; undergoing radiation therapy.  Light resistance only.   WEIGHT BEARING RESTRICTIONS No  FALLS:  Has patient fallen in  last 6 months? No  LIVING ENVIRONMENT: Lives with: lives alone Lives in: House/apartment Stairs: No Has following equipment at home: None  OCCUPATION: retired  PLOF: Independent  PATIENT GOALS  Wants to move more, decrease pain   OBJECTIVE:   DIAGNOSTIC FINDINGS:  Bone Density: Site Region Measured Date Measured Age YA BMD Significant CHANGE T-score AP Spine L1-L4 10/21/2021 75.4 -3.2 0.794 g/cm2  DualFemur Total Left 10/21/2021 75.4 -2.0 0.752 g/cm2  DualFemur Total Mean 10/21/2021 75.4 -2.0 0.761 g/cm2  Left Forearm Radius 33% 10/21/2021 75.4 -3.5 0.567 g/cm2  The BMD measured at Forearm Radius 33% is 0.567 g/cm2 with a T-score of -3.5. This patient is considered osteoporotic according to [facility]) criteria.  PATIENT SURVEYS:  NDI 14/50   MCID = 7.5 points. 05/17/2022- 7/50   MUSCLE LENGTH:   POSTURE: rounded shoulders, forward head, increased thoracic kyphosis, and tight left QL creating R convex lumbar scoliosis  PALPATION: Sore in subocciptals, bil UT along upper and mid thoracic paraspinals  LUMBAR ROM:   Active  AROM  eval 05/17/2022  Flexion WFL, Limited by HS To ankles  Extension [facility] WFL  Right lateral flexion Limited by tight left QL To knee  Left lateral flexion WNL To knee  Right rotation 50% WFL  Left rotation 50% WFL   (Blank rows = not tested)  THORACIC ROTATION:  limited 75% Left and R 50% limited  CERVICAL ROM:  Active  A/PROM  eval AROM 05/11/22 AROM  05/17/22  Flexion WNL WNL 40  Extension WNL WNL 45  Right lateral flexion 50% 75% 40  Left lateral flexion 50% 75% 35 *  Right rotation 50% 70% 75  Left rotation 50% 75% 80    LOWER EXTREMITY ROM:     WFL for exam  LOWER EXTREMITY MMT:    MMT Right eval Left eval Right  05/17/22 Left 05/17/2022  Hip flexion 4 4 4+ 4+  Hip extension      Hip abduction 4+ 4+ 5 5  Hip adduction 4 4 4+ 4+  Hip internal rotation      Hip external rotation      Knee flexion 4+ 4  5 5   Knee extension 4+ 4+ 5 5  Ankle dorsiflexion 5 5    Ankle plantarflexion      Ankle inversion      Ankle eversion       (Blank rows = not tested)  UPPER EXTREMITY STRENGTH: WNL   TODAY'S TREATMENT  05/24/22 Therapeutic Exercise: Nustep L3x45mn ; SpO2 remained @ 90% both times checked Leg press 15# 2x10  Step ups 5x each LE  4x150 ft,  SpO2 dropped to 89% but quickly went back up to 90% w/ rest; rest required halfway through @ 2 laps, seated break taken at end  Manual Therapy: STM to B rhomboids, LS, UT, lower trap   05/20/2022 Therapeutic Exercise: to improve strength and mobility.  Monitoring vitals and tolerance throughout.  Cues for breathing throughout and pacing.  Nustep L3 x 4 min (SpO2 90%, HR 118, allowed to rest until 93%) , x 3 min directed to do slower pace (SpO2 92%,HR 105) Cybex knee extension 15# 2 x 10 SpO2 95%, HR 105  Cybex knee flexion 15# 1 x 15 SpO2 93%, HR 105 Cybex leg press 10# 1 x 10  (SpO2 93%, HR 96) 1 x 15# (O2 90%, HR 90 - patient was talking  more and less focused on breathing this set).  Deep breathing exercises - SpO2 95% afterwards.  2MWT (Sp02 93%, HR 85 at start, afterwards SpO2 89%, HR 105) - 312' ft   05/17/2022 Therapeutic Exercise: to improve strength and mobility.  Demo, verbal and tactile cues throughout for technique. UBE L1 4 min f/2 min back  O2 97%, HR 118 Cybex rows 10# 1 x 10 Cybex knee extension 10# 1 x 10 Cybex knee flexion 15# 1 x 10 O2 93%, HR 88 Cybex leg press 15# 1 x 10, toe press 15# 1 x 10 O2 90%, HR 88 Squats x 10  Physical Performance: to assess progress towards goals.  Cervical ROM, lumbar ROM, MMT, NDI.  See objective.      PATIENT EDUCATION:  Education details: HEP updated - rhomboid stretch added to HEP Person educated: Patient Education method: Explanation, Demonstration, Verbal cues, and Handouts Education comprehension: verbalized understanding and returned demonstration   HOME EXERCISE PROGRAM: Access  Code: [email or web address]  ASSESSMENT:  CLINICAL IMPRESSION:  Good response to treatment. Advanced through cardiopulmonary endurance exercises to improve activity tolerance and endurance. SpO2 did drop slightly with gait training and leg press but was able to return to baseline shortly after rest. Addressed soreness along R shoulder blade with MT to reduce tension and promote muscle flexibility.     OBJECTIVE IMPAIRMENTS decreased ROM, decreased strength, increased muscle spasms, impaired flexibility, postural dysfunction, and pain.   ACTIVITY LIMITATIONS carrying, lifting, and bending  PARTICIPATION LIMITATIONS:  pain affects all ADLS  PERSONAL FACTORS 3+ comorbidities: Breast Cancer and current radiation tx, OP, h/o vertigo, stage 4 COPD   are also affecting patient's functional outcome.   REHAB POTENTIAL: Good  CLINICAL DECISION MAKING: Evolving/moderate complexity  EVALUATION COMPLEXITY: Low   GOALS: Goals reviewed with patient? Yes  SHORT TERM GOALS: Target date: 04/20/2022  Ind with initial HEP Baseline: Goal status: MET  2.  Balance assessed and goals set Baseline:  Goal status: MET  LONG TERM GOALS: Target date: 06/01/2022  Decreased pain by 75% or more to improve pt's QOL. Baseline:  Goal status: MET 05/03/2022 reports no back pain  2. Improved cervical rotation to [facility] to ease ADLS. Baseline:  Goal status: MET 05/17/2022 - see objective   3.  Improved tolerance to exercise and/or cardiovascular activity by 50% or more to improve health outcomes.  Baseline:  Goal status: IN PROGRESS 05/17/2022 -25% improvement.   4.  Improved LE strength to 4+/5 or better to allow for improved body mechanics with ADLs. Baseline:  Goal status: MET 05/17/2022 - see objective   5.  Improved NDI to <= 5 demonstrating decreased disability Baseline: 14 Goal status: IN PROGRESS 05/17/22- 7/50   PLAN: PT FREQUENCY:  2x/week  PT DURATION: 8 weeks  PLANNED INTERVENTIONS: Therapeutic exercises,  Therapeutic activity, Neuromuscular re-education, Balance training, Gait training, Patient/Family education, Joint mobilization, Aquatic Therapy, Dry Needling, Spinal mobilization, Cryotherapy, Taping, and Manual therapy.  PLAN FOR NEXT SESSION:  Focus on improving cardiovascular endurance to tolerance.  Monitor O2   Artist Pais, PTA 05/24/2022, 10:18 AM

## 2022-05-27 ENCOUNTER — Ambulatory Visit: Payer: Medicare Other

## 2022-05-27 DIAGNOSIS — M6281 Muscle weakness (generalized): Secondary | ICD-10-CM

## 2022-05-27 DIAGNOSIS — M542 Cervicalgia: Secondary | ICD-10-CM | POA: Diagnosis not present

## 2022-05-27 DIAGNOSIS — M5459 Other low back pain: Secondary | ICD-10-CM

## 2022-05-27 DIAGNOSIS — M546 Pain in thoracic spine: Secondary | ICD-10-CM | POA: Diagnosis not present

## 2022-05-27 DIAGNOSIS — R293 Abnormal posture: Secondary | ICD-10-CM | POA: Diagnosis not present

## 2022-05-27 DIAGNOSIS — R252 Cramp and spasm: Secondary | ICD-10-CM | POA: Diagnosis not present

## 2022-05-27 NOTE — Therapy (Addendum)
OUTPATIENT PHYSICAL THERAPY TREATMENT PHYSICAL THERAPY DISCHARGE SUMMARY  Visits from Start of Care: 13  Current functional level related to goals / functional outcomes: Improvement in pain and functional ability.   Met 3/5 goals and good progress towards remaining goals.    Remaining deficits: Decreased cardiovascular endurance   Education / Equipment: HEP  Plan: Patient agrees to discharge.  Patient is being discharged due to meeting the stated rehab goals.  She was placed on 30 day hold on 05/27/2022 and has not needed to return within stated time frame.   Rennie Natter, PT, DPT 9:30 AM 06/28/2022         Patient Name: Yolanda Morris MRN: [id number] DOB:09-15-1946, 76 y.o., female Today's Date: 05/27/2022   PT End of Session - 05/27/22 1004     Visit Number 13    Number of Visits 13    Date for PT Re-Evaluation 06/01/22    Authorization Type MCR    Progress Note Due on Visit 10    PT Start Time 0846    PT Stop Time 0930    PT Time Calculation (min) 44 min    Activity Tolerance Patient tolerated treatment well    Behavior During Therapy [facility] for tasks assessed/performed                    Past Medical History:  Diagnosis Date   Cancer ([place])    skin   COPD (chronic obstructive pulmonary disease) ([place])    DM (diabetes mellitus) ([place])    Dyspnea    increased activity    Family history of ovarian cancer 02/03/2022   Family history of stomach cancer 02/03/2022   Genital herpes    GERD (gastroesophageal reflux disease)    Hyperlipidemia    Migraines    Osteoporosis    Unspecified essential hypertension    Past Surgical History:  Procedure Laterality Date   BREAST BIOPSY     BREAST EXCISIONAL BIOPSY     over 20 years ago- benign   BREAST LUMPECTOMY WITH RADIOACTIVE SEED AND SENTINEL LYMPH NODE BIOPSY Right 02/16/2022   Procedure: RIGHT BREAST LUMPECTOMY WITH RADIOACTIVE SEED AND SENTINEL LYMPH NODE BIOPSY;  Surgeon: [name], MD;   Location: [place];  Service: General;  Laterality: Right;   BUNIONECTOMY Left 2004   and hammer toe repair Left foot   COLON SURGERY     removal of polyps   [place]     Patient Active Problem List   Diagnosis Date Noted   Genetic testing 02/09/2022   Family history of ovarian cancer 02/03/2022   Family history of stomach cancer 02/03/2022   Malignant neoplasm of upper-outer quadrant of right breast in female, estrogen receptor positive ([place]) 01/28/2022   Medication management 07/21/2020   Healthcare maintenance 07/21/2020   Body aches 07/22/2019   Chronic respiratory failure with hypoxia ([place]) 06/17/2019   Allergic rhinitis 11/13/2018   Shortness of breath 10/23/2018   COPD exacerbation ([place]) 01/07/2015   Cough 07/05/2013   Acute sinusitis 02/12/2013   Acute bronchitis 07/08/2011   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) 01/05/2011    PCP: [name], MD  REFERRING PROVIDER: [name], MD   REFERRING DIAG: C50.411,Z17.0 (ICD-10-CM) - Malignant neoplasm of upper-outer quadrant of right breast in female, estrogen receptor positive ([place]); Increased back pain - recent lumpectomy and osteoporosis  Rationale for Evaluation and Treatment Rehabilitation  THERAPY DIAG:  Pain in thoracic  spine  Cervicalgia  Abnormal posture  Muscle weakness (generalized)  Cramp and spasm  Other low back pain  ONSET DATE: 02/16/22  SUBJECTIVE:                                                                                                                                                                                           SUBJECTIVE STATEMENT: No pain today. Breathing is still not the best.  PERTINENT HISTORY:  Patient was diagnosed on 12/27/2021 with right grade 2 invasive ductal carcinoma breast cancer.   Post Rt breast lumpectomy with no carcinoma in 4 removed nodes on 02/16/22. Started radiation therapy 03/31/22. It's Monday-Thursday  for 4 weeks. Last session scheduled 04/28/22. OP, h/o vertigo, stage 4 COPD and is on 2 liters of oxygen when she is walking. Brings O2 with her.  Compression fracture T7  PAIN:  Are you having pain? No: NPRS scale: 0/10 Pain location: thoracic spine Pain description: achy Aggravating factors: radiation bed/hard surfaces; bending forward Relieving factors: heat, advil   PRECAUTIONS: Other: RECENT R LUMPECTOMY 02/16/22; undergoing radiation therapy.  Light resistance only.   WEIGHT BEARING RESTRICTIONS No  FALLS:  Has patient fallen in last 6 months? No  LIVING ENVIRONMENT: Lives with: lives alone Lives in: House/apartment Stairs: No Has following equipment at home: None  OCCUPATION: retired  PLOF: Independent  PATIENT GOALS  Wants to move more, decrease pain   OBJECTIVE:   DIAGNOSTIC FINDINGS:  Bone Density: Site Region Measured Date Measured Age YA BMD Significant CHANGE T-score AP Spine L1-L4 10/21/2021 75.4 -3.2 0.794 g/cm2  DualFemur Total Left 10/21/2021 75.4 -2.0 0.752 g/cm2  DualFemur Total Mean 10/21/2021 75.4 -2.0 0.761 g/cm2  Left Forearm Radius 33% 10/21/2021 75.4 -3.5 0.567 g/cm2  The BMD measured at Forearm Radius 33% is 0.567 g/cm2 with a T-score of -3.5. This patient is considered osteoporotic according to [facility]) criteria.  PATIENT SURVEYS:  NDI 14/50   MCID = 7.5 points. 05/17/2022- 7/50  NDI - 10/50 - 05/27/22  MUSCLE LENGTH:   POSTURE: rounded shoulders, forward head, increased thoracic kyphosis, and tight left QL creating R convex lumbar scoliosis  PALPATION: Sore in subocciptals, bil UT along upper and mid thoracic paraspinals  LUMBAR ROM:   Active  AROM  eval 05/17/2022  Flexion WFL, Limited by HS To ankles  Extension [facility] WFL  Right lateral flexion Limited by tight left QL To knee  Left lateral flexion WNL To knee  Right rotation 50% WFL  Left rotation 50% WFL   (Blank rows = not tested)  THORACIC ROTATION:   limited 75% Left and R 50% limited  CERVICAL ROM:  Active  A/PROM  eval AROM 05/11/22 AROM  05/17/22  Flexion WNL WNL 40  Extension WNL WNL 45  Right lateral flexion 50% 75% 40  Left lateral flexion 50% 75% 35 *  Right rotation 50% 70% 75  Left rotation 50% 75% 80    LOWER EXTREMITY ROM:     WFL for exam  LOWER EXTREMITY MMT:    MMT Right eval Left eval Right  05/17/22 Left 05/17/2022  Hip flexion 4 4 4+ 4+  Hip extension      Hip abduction 4+ 4+ 5 5  Hip adduction 4 4 4+ 4+  Hip internal rotation      Hip external rotation      Knee flexion 4+ 4 5 5   Knee extension 4+ 4+ 5 5  Ankle dorsiflexion 5 5    Ankle plantarflexion      Ankle inversion      Ankle eversion       (Blank rows = not tested)  UPPER EXTREMITY STRENGTH: WNL   TODAY'S TREATMENT  05/27/22 Therapeutic Exercise: Nustep L3x32mn Reviewed HEP, spoke w/ pt about progress, 30 day hold and updated HEP 05/24/22 Therapeutic Exercise: Nustep L3x824m ; SpO2 remained @ 90% both times checked Leg press 15# 2x10  Step ups 5x each LE  4x150 ft, SpO2 dropped to 89% but quickly went back up to 90% w/ rest; rest required halfway through @ 2 laps, seated break taken at end  Manual Therapy: STM to B rhomboids, LS, UT, lower trap   05/20/2022 Therapeutic Exercise: to improve strength and mobility.  Monitoring vitals and tolerance throughout.  Cues for breathing throughout and pacing.  Nustep L3 x 4 min (SpO2 90%, HR 118, allowed to rest until 93%) , x 3 min directed to do slower pace (SpO2 92%,HR 105) Cybex knee extension 15# 2 x 10 SpO2 95%, HR 105  Cybex knee flexion 15# 1 x 15 SpO2 93%, HR 105 Cybex leg press 10# 1 x 10  (SpO2 93%, HR 96) 1 x 15# (O2 90%, HR 90 - patient was talking  more and less focused on breathing this set).  Deep breathing exercises - SpO2 95% afterwards.  2MWT (Sp02 93%, HR 85 at start, afterwards SpO2 89%, HR 105) - 312' ft   05/17/2022 Therapeutic Exercise: to improve strength and mobility.   Demo, verbal and tactile cues throughout for technique. UBE L1 4 min f/2 min back  O2 97%, HR 118 Cybex rows 10# 1 x 10 Cybex knee extension 10# 1 x 10 Cybex knee flexion 15# 1 x 10 O2 93%, HR 88 Cybex leg press 15# 1 x 10, toe press 15# 1 x 10 O2 90%, HR 88 Squats x 10  Physical Performance: to assess progress towards goals.  Cervical ROM, lumbar ROM, MMT, NDI.  See objective.      PATIENT EDUCATION:  Education details: HEP updated - rhomboid stretch added to HEP Person educated: Patient Education method: Explanation, Demonstration, Verbal cues, and Handouts Education comprehension: verbalized understanding and returned demonstration   HOME EXERCISE PROGRAM: Access Code: [email or web address]:  CLINICAL IMPRESSION:  Pt has improved NDI score to 10/20. Her endurance has improved but not quite met her goal for cardiovascular endurance. Her activity tolerance has improved with ADLs with regard to endurance. Reviewed HEP, counseled on progress with PT and details of 30 day hold. She will be placed on 30 day hold d/t her being satisfied w/ progress.    OBJECTIVE IMPAIRMENTS decreased  ROM, decreased strength, increased muscle spasms, impaired flexibility, postural dysfunction, and pain.   ACTIVITY LIMITATIONS carrying, lifting, and bending  PARTICIPATION LIMITATIONS:  pain affects all ADLS  PERSONAL FACTORS 3+ comorbidities: Breast Cancer and current radiation tx, OP, h/o vertigo, stage 4 COPD   are also affecting patient's functional outcome.   REHAB POTENTIAL: Good  CLINICAL DECISION MAKING: Evolving/moderate complexity  EVALUATION COMPLEXITY: Low   GOALS: Goals reviewed with patient? Yes  SHORT TERM GOALS: Target date: 04/20/2022  Ind with initial HEP Baseline: Goal status: MET  2.  Balance assessed and goals set Baseline:  Goal status: MET  LONG TERM GOALS: Target date: 06/01/2022  Decreased pain by 75% or more to improve pt's QOL. Baseline:  Goal status: MET  05/03/2022 reports no back pain  2. Improved cervical rotation to [facility] to ease ADLS. Baseline:  Goal status: MET 05/17/2022 - see objective   3.  Improved tolerance to exercise and/or cardiovascular activity by 50% or more to improve health outcomes.  Baseline:  Goal status: IN PROGRESS- 05/27/22 activity tolerance improved  4.  Improved LE strength to 4+/5 or better to allow for improved body mechanics with ADLs. Baseline:  Goal status: MET 05/17/2022 - see objective   5.  Improved NDI to <= 5 demonstrating decreased disability Baseline: 14 Goal status: IN PROGRESS 05/27/22    PLAN: PT FREQUENCY: 2x/week  PT DURATION: 8 weeks  PLANNED INTERVENTIONS: Therapeutic exercises, Therapeutic activity, Neuromuscular re-education, Balance training, Gait training, Patient/Family education, Joint mobilization, Aquatic Therapy, Dry Needling, Spinal mobilization, Cryotherapy, Taping, and Manual therapy.  PLAN FOR NEXT SESSION:  Focus on improving cardiovascular endurance to tolerance.  Monitor O2   Artist Pais, PTA 05/27/2022, 10:04 AM

## 2022-05-31 ENCOUNTER — Encounter: Payer: Medicare Other | Admitting: Physical Therapy

## 2022-06-01 ENCOUNTER — Ambulatory Visit: Payer: Medicare Other | Attending: Surgery | Admitting: Rehabilitation

## 2022-06-01 DIAGNOSIS — Z170 Estrogen receptor positive status [ER+]: Secondary | ICD-10-CM | POA: Insufficient documentation

## 2022-06-01 DIAGNOSIS — Z483 Aftercare following surgery for neoplasm: Secondary | ICD-10-CM | POA: Insufficient documentation

## 2022-06-01 DIAGNOSIS — C50411 Malignant neoplasm of upper-outer quadrant of right female breast: Secondary | ICD-10-CM | POA: Insufficient documentation

## 2022-06-01 NOTE — Therapy (Signed)
OUTPATIENT PHYSICAL THERAPY SOZO SCREENING NOTE   Patient Name: Yolanda Morris MRN: [id number] DOB:July 20, 1946, 76 y.o., female Today's Date: 06/01/2022  PCP: [name], MD REFERRING PROVIDER: [name], MD   PT End of Session - 06/01/22 1134     Visit Number 13   screen only   PT Start Time 6433    Activity Tolerance Patient tolerated treatment well    Behavior During Therapy [facility] for tasks assessed/performed             Past Medical History:  Diagnosis Date   Cancer ([place])    skin   COPD (chronic obstructive pulmonary disease) ([place])    DM (diabetes mellitus) ([place])    Dyspnea    increased activity    Family history of ovarian cancer 02/03/2022   Family history of stomach cancer 02/03/2022   Genital herpes    GERD (gastroesophageal reflux disease)    Hyperlipidemia    Migraines    Osteoporosis    Unspecified essential hypertension    Past Surgical History:  Procedure Laterality Date   BREAST BIOPSY     BREAST EXCISIONAL BIOPSY     over 20 years ago- benign   BREAST LUMPECTOMY WITH RADIOACTIVE SEED AND SENTINEL LYMPH NODE BIOPSY Right 02/16/2022   Procedure: RIGHT BREAST LUMPECTOMY WITH RADIOACTIVE SEED AND SENTINEL LYMPH NODE BIOPSY;  Surgeon: [name], MD;  Location: [place];  Service: General;  Laterality: Right;   BUNIONECTOMY Left 2004   and hammer toe repair Left foot   COLON SURGERY     removal of polyps   [place]     Patient Active Problem List   Diagnosis Date Noted   Genetic testing 02/09/2022   Family history of ovarian cancer 02/03/2022   Family history of stomach cancer 02/03/2022   Malignant neoplasm of upper-outer quadrant of right breast in female, estrogen receptor positive ([place]) 01/28/2022   Medication management 07/21/2020   Healthcare maintenance 07/21/2020   Body aches 07/22/2019   Chronic respiratory failure with hypoxia ([place]) 06/17/2019   Allergic rhinitis 11/13/2018    Shortness of breath 10/23/2018   COPD exacerbation ([place]) 01/07/2015   Cough 07/05/2013   Acute sinusitis 02/12/2013   Acute bronchitis 07/08/2011   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) 01/05/2011    REFERRING DIAG: right breast cancer at risk for lymphedema  THERAPY DIAG:  Malignant neoplasm of upper-outer quadrant of right breast in female, estrogen receptor positive ([place])  Aftercare following surgery for neoplasm  PERTINENT HISTORY: Patient was diagnosed on 12/27/2021 with right grade 2 invasive ductal carcinoma breast cancer. It measures 8 mm and is located in the upper outer quadrant. It is weakly ER positive, PR negative, and HER2 negative with a Ki67 of 20%. She has stage 4 COPD and is on 2 liters of oxygen. Post Rt breast lumpectomy with no carcinoma in 4 removed nodes on 02/16/22. Will be having radiation.   PRECAUTIONS: right UE Lymphedema risk, None  SUBJECTIVE: last radiation 04/28/22  PAIN:  Are you having pain? It is slightly tender in the armpit from radiation  SOZO SCREENING: Patient was assessed today using the SOZO machine to determine the lymphedema index score. This was compared to her baseline score. It was determined that she is within the recommended range when compared to her baseline and no further action is needed at this time. She will continue SOZO screenings. These are done every 3 months for 2 years  post operatively followed by every 6 months for 2 years, and then annually.  Also educated pt on scar massage and axillary circles and use of pillow for puffiness and to let us know at her next check in if it is still bothersome.    Stark Bray, PT 06/01/2022, 11:35 AM

## 2022-06-03 ENCOUNTER — Encounter: Payer: Self-pay | Admitting: Radiation Oncology

## 2022-06-03 ENCOUNTER — Ambulatory Visit
Admission: RE | Admit: 2022-06-03 | Discharge: 2022-06-03 | Disposition: A | Discharge status: Home / Self Care | Payer: Medicare Other | Source: Ambulatory Visit | Attending: Radiation Oncology | Admitting: Radiation Oncology

## 2022-06-03 ENCOUNTER — Other Ambulatory Visit: Payer: Self-pay

## 2022-06-03 VITALS — BP 146/63 | HR 74 | Temp 97.2°F | Resp 16 | Ht 62.5 in | Wt 151.6 lb

## 2022-06-03 DIAGNOSIS — Z79899 Other long term (current) drug therapy: Secondary | ICD-10-CM | POA: Diagnosis not present

## 2022-06-03 DIAGNOSIS — Z923 Personal history of irradiation: Secondary | ICD-10-CM | POA: Insufficient documentation

## 2022-06-03 DIAGNOSIS — Z7984 Long term (current) use of oral hypoglycemic drugs: Secondary | ICD-10-CM | POA: Diagnosis not present

## 2022-06-03 DIAGNOSIS — Z170 Estrogen receptor positive status [ER+]: Secondary | ICD-10-CM

## 2022-06-03 DIAGNOSIS — Z7982 Long term (current) use of aspirin: Secondary | ICD-10-CM | POA: Insufficient documentation

## 2022-06-03 DIAGNOSIS — C50411 Malignant neoplasm of upper-outer quadrant of right female breast: Secondary | ICD-10-CM | POA: Insufficient documentation

## 2022-06-03 NOTE — Progress Notes (Signed)
MYKALA MCCREADY presents today for follow-up after completing radiation to her right breast on 04/28/2022  Pain: Reports continued nerve pain, as well as discomfort at lumpectomy incision Skin: Reports skin is intact and has healed well ROM: Denies any issues or concerns Lymphedema: None (had SOZO evaluation on 06/01/22) MedOnc F/U: Will see Annabelle Harman in the [place] clinic in September Other issues of note: Reports continued fatigue, but feels she is able to manage with afternoon naps. She is slowly increasing her physical activity, and is hoping to restart walking on her treadmill daily  Pt reports Yes No Comments  Tamoxifen '[]'$  '[x]'$    Letrozole '[]'$  '[x]'$    Anastrazole '[]'$  '[x]'$    Mammogram '[x]'$  Date: TBD '[]'$ 

## 2022-06-03 NOTE — Progress Notes (Signed)
Radiation Oncology         [phone]) [phone] ________________________________  Name: Yolanda Morris MRN: [id number]  Date: 06/03/2022  DOB: 04-Dec-1945  Follow-Up Visit Note  Outpatient  CC: [name], MD  [name], MD  Diagnosis and Prior Radiotherapy:    ICD-10-CM   1. Malignant neoplasm of upper-outer quadrant of right breast in female, estrogen receptor positive ([place])  C50.411    Z17.0       CHIEF COMPLAINT: Here for follow-up and surveillance of breast cancer  Narrative:  The patient returns today for routine follow-up.  TA FAIR presents today for follow-up after completing radiation to her right breast on 04/28/2022  Pain: Reports continued nerve pain, as well as discomfort at lumpectomy incision Skin: Reports skin is intact and has healed well ROM: Denies any issues or concerns Lymphedema: None (had SOZO evaluation on 06/01/22) MedOnc F/U: Will see Annabelle Harman in the [place] clinic in September Other issues of note: Reports continued fatigue, but feels she is able to manage with afternoon naps. She is slowly increasing her physical activity, and is hoping to restart walking on her treadmill daily  She reports that she recently had an EKG performed in cardiology.  Pt reports Yes No Comments  Tamoxifen '[]'$  '[x]'$    Letrozole '[]'$  '[x]'$    Anastrazole '[]'$  '[x]'$    Mammogram '[x]'$  Date: TBD '[]'$                                  ALLERGIES:  is allergic to telithromycin, bacitracin, and levofloxacin.  Meds: Current Outpatient Medications  Medication Sig Dispense Refill   acetaminophen (TYLENOL) 325 MG tablet Take 650 mg by mouth every 6 (six) hours as needed for moderate pain.     albuterol (PROVENTIL) (2.5 MG/3ML) 0.083% nebulizer solution Take 3 mLs (2.5 mg total) by nebulization every 6 (six) hours as needed for wheezing or shortness of breath. 360 mL 12   albuterol (VENTOLIN HFA) 108 (90 Base) MCG/ACT inhaler Inhale 2 puffs into the lungs every 6  (six) hours as needed for wheezing or shortness of breath. 3 each 3   aspirin EC 81 MG EC tablet Take 81 mg by mouth daily.     cetirizine (ZYRTEC) 10 MG tablet Take 10 mg by mouth daily.     Cholecalciferol (VITAMIN D3) 1000 UNITS CAPS Take 1,000 Units by mouth daily.     COVID-19 mRNA bivalent vaccine, Pfizer, (PFIZER COVID-19 VAC BIVALENT) injection Inject into the muscle. 0.3 mL 0   Cranberry 500 MG TABS Take 500 mg by mouth daily.     denosumab (PROLIA) 60 MG/ML SOSY injection Inject 60 mg into the skin every 6 (six) months.     fluticasone (FLONASE) 50 MCG/ACT nasal spray Place 1 spray into both nostrils daily.     Fluticasone-Umeclidin-Vilant (TRELEGY ELLIPTA) 100-62.5-25 MCG/ACT AEPB Inhale 1 puff into the lungs daily. 60 each 3   ibuprofen (ADVIL) 200 MG tablet Take 200 mg by mouth every 8 (eight) hours as needed for moderate pain.     IGLUCOSE TEST STRIPS test strip See admin instructions.     L-Lysine 500 MG CAPS Take 500 mg by mouth daily.     Lancets (STERILANCE TL) MISC See admin instructions.     losartan (COZAAR) 50 MG tablet TAKE 1 TABLET(50 MG) BY MOUTH TWICE DAILY 180 tablet 0   Melatonin 5 MG CAPS Take 5 mg by mouth  daily as needed (sleep).     metFORMIN (GLUCOPHAGE-XR) 500 MG 24 hr tablet Take 1,000 mg by mouth daily after supper.     Multiple Vitamins-Minerals (CENTRUM SILVER PO) Take 1 tablet by mouth daily.       omeprazole (PRILOSEC) 20 MG capsule Take 20 mg by mouth every other day.      OXYGEN Inhale 2 L into the lungs daily as needed (SOB).     polyethylene glycol (MIRALAX / GLYCOLAX) 17 g packet Take 17 g by mouth every other day.     rosuvastatin (CRESTOR) 10 MG tablet Take 1 tablet (10 mg total) by mouth at bedtime. 90 tablet 0   traMADol (ULTRAM) 50 MG tablet Take 1 tablet (50 mg total) by mouth every 6 (six) hours as needed. 20 tablet 0   valACYclovir (VALTREX) 500 MG tablet Take 500 mg by mouth 2 (two) times daily as needed (outbreaks). For 3 days     No  current facility-administered medications for this encounter.    Physical Findings: The patient is in no acute distress. Patient is alert and oriented.  height is 5' 2.5" (1.588 m) and weight is 151 lb 9.6 oz (68.8 kg). Her temperature is 97.2 F (36.2 C) (abnormal). Her blood pressure is 146/63 (abnormal) and her pulse is 74. Her respiration is 16 and oxygen saturation is 94%. .    Satisfactory skin healing in radiotherapy fields.      Lab Findings: Lab Results  Component Value Date   WBC 7.7 02/02/2022   HGB 14.1 02/02/2022   HCT 43.0 02/02/2022   MCV 90.3 02/02/2022   PLT 267 02/02/2022    Radiographic Findings: No results found.  Impression/Plan: Healing well from radiotherapy to the breast tissue.  Continue skin care with topical Vitamin E Oil and / or lotion for at least 2 more months for further healing.  I encouraged her to continue with yearly mammography as appropriate (for intact breast tissue) and followup with medical oncology. I will see her back on an as-needed basis. I have encouraged her to call if she has any issues or concerns in the future. I wished her the very best.  On date of service, in total, I spent 25 minutes on this encounter. Patient was seen in person.  _____________________________________   Eppie Gibson, MD

## 2022-06-08 NOTE — Progress Notes (Signed)
                                                                                                                                                             Patient Name: Yolanda Morris MRN: [id number] DOB: 06/11/1946 Referring Physician: [name] (Profile Not Attached) Date of Service: 04/28/2022 [facility] [place]                                                        End Of Treatment Note  Diagnoses: C50.411-Malignant neoplasm of upper-outer quadrant of right female breast  Cancer Staging:  Cancer Staging  Malignant neoplasm of upper-outer quadrant of right breast in female, estrogen receptor positive ([place]) Staging form: Breast, AJCC 8th Edition - Pathologic: Stage IB (pT1b, pN0, cM0, G2, ER-, PR-, HER2-) - Signed by [name], MD on 03/01/2022 Histologic grading system: 3 grade system  Intent: Curative  Radiation Treatment Dates: 03/31/2022 through 04/28/2022 Site Technique Total Dose (Gy) Dose per Fx (Gy) Completed Fx Beam Energies  Breast, Right: Breast_R 3D 40.05/40.05 2.67 15/15 10XFFF  Breast, Right: Breast_R_Bst 3D 10/10 2 5/5 6X, 10X   Narrative: The patient tolerated radiation therapy relatively well.   Plan: The patient will follow-up with radiation oncology in 62mo. -----------------------------------  SEppie Gibson MD

## 2022-06-09 ENCOUNTER — Ambulatory Visit (INDEPENDENT_AMBULATORY_CARE_PROVIDER_SITE_OTHER): Payer: Medicare Other | Admitting: Internal Medicine

## 2022-06-09 ENCOUNTER — Encounter: Payer: Self-pay | Admitting: Internal Medicine

## 2022-06-09 VITALS — BP 140/80 | HR 96 | Ht 62.0 in | Wt 152.2 lb

## 2022-06-09 DIAGNOSIS — J9611 Chronic respiratory failure with hypoxia: Secondary | ICD-10-CM | POA: Diagnosis not present

## 2022-06-09 DIAGNOSIS — J449 Chronic obstructive pulmonary disease, unspecified: Secondary | ICD-10-CM | POA: Diagnosis not present

## 2022-06-09 NOTE — Patient Instructions (Addendum)
Please schedule follow up scheduled with myself in 6 months.  If my schedule is not open yet, we will contact you with a reminder closer to that time. Please call [phone] if you haven't heard from [place] a month before.   We will send a new nebulizer machine to your house through [place].  We will order an overnight oximetry test so we can see if you need to be wearing oxygen at night time.   Continue trelegy 1 puff once a day with albuterol as needed.   Call me if you need me sooner.

## 2022-06-09 NOTE — Progress Notes (Signed)
Yolanda Morris    [id number]    1946-10-05  Primary Care Physician:Smith, Hal Hope, MD Date of Appointment: 06/09/2022 Established Patient Visit  Chief complaint:   Chief Complaint  Patient presents with   Follow-up    Follow-up     HPI: Yolanda Morris is a 76 y.o. woman with history COPD and COVID infection. On home oxygen 2LNC.    Lives at home with her dog. Husband passed away in 01-23-18 from cancer DME [employer]   She has done pulmonary rehabilitation twice and really enjoyed it.   Interval Updates: Here for follow up for COPD.  She has had lumpectomy and adjuvant radiation for breast cancer. She is doing well aside from some fatigue.  She has been doing physical therapy due to pain from previous spine fracture and this helped her pain a lot.   Her breathing has had its ups and downs. She was able to maintain oxygen saturations on room air. Taking trelegy one a day and using albuterol neb most days before exercise.    No flares of COPD. She is getting patient assistance for trelegy. She is starting to get back on her treadmill at home.   I have reviewed the patient's family social and past medical history and updated as appropriate.   Past Medical History:  Diagnosis Date   Cancer ([place])    skin   COPD (chronic obstructive pulmonary disease) (HCC)    DM (diabetes mellitus) ([place])    Dyspnea    increased activity    Family history of ovarian cancer 02/03/2022   Family history of stomach cancer 02/03/2022   Genital herpes    GERD (gastroesophageal reflux disease)    Hyperlipidemia    Migraines    Osteoporosis    Unspecified essential hypertension     Past Surgical History:  Procedure Laterality Date   BREAST BIOPSY     BREAST EXCISIONAL BIOPSY     over 20 years ago- benign   BREAST LUMPECTOMY WITH RADIOACTIVE SEED AND SENTINEL LYMPH NODE BIOPSY Right 02/16/2022   Procedure: RIGHT BREAST LUMPECTOMY WITH RADIOACTIVE SEED AND SENTINEL LYMPH NODE BIOPSY;   Surgeon: Yolanda Keens, MD;  Location: [facility];  Service: General;  Laterality: Right;   BUNIONECTOMY Left 01/24/03   and hammer toe repair Left foot   COLON SURGERY     removal of polyps   DENTAL SURGERY     RETINAL TEAR REPAIR CRYOTHERAPY      Family History  Problem Relation Age of Onset   COPD Mother        deceased   Heart disease Mother    Alcohol abuse Father    Alcohol abuse Sister        deceased   Heart attack Sister    Alcohol abuse Brother        deceased   Cancer Maternal Aunt        unknown type; d. 53   Cancer Maternal Uncle        unknown type; d. 45s   Ovarian cancer Paternal Aunt        d. 48   Stomach cancer Paternal Aunt 22   Breast cancer Neg Hx     Social History   Occupational History   Occupation: Development worker, community  Tobacco Use   Smoking status: Former    Packs/day: 2.00    Years: 25.00    Total pack years: 50.00    Types: Cigarettes  Quit date: 10/17/1996    Years since quitting: 25.6   Smokeless tobacco: Never  Vaping Use   Vaping Use: Never used  Substance and Sexual Activity   Alcohol use: No    Alcohol/week: 0.0 standard drinks of alcohol   Drug use: No   Sexual activity: Not Currently    Birth control/protection: Post-menopausal     Physical Exam: Blood pressure (!) 140/80, pulse 96, height '5\' 2"'$  (1.575 m), weight 152 lb 3.2 oz (69 kg), SpO2 94 %. Gen:NAD, resting comfortably CV: RRR no mrg Resp: diminished, no wheezes or crackles   Data Reviewed: Imaging: I have personally reviewed the chest xray October 2021 which shows hyperinflation   PFTs:      Latest Ref Rng & Units 09/09/2019    9:02 AM  PFT Results  FVC-Pre L 1.69   FVC-Predicted Pre % 63   FVC-Post L 1.77   FVC-Predicted Post % 66   Pre FEV1/FVC % % 58   Post FEV1/FCV % % 57   FEV1-Pre L 0.99   FEV1-Predicted Pre % 49   FEV1-Post L 1.00   DLCO uncorrected ml/min/mmHg 16.62   DLCO UNC% % 92   DLVA Predicted % 115   TLC L 5.98   TLC %  Predicted % 125   RV % Predicted % 185    I have personally reviewed the patient's PFTs and they show severe airflow limitation.   Labs: Lab Results  Component Value Date   WBC 7.7 02/02/2022   HGB 14.1 02/02/2022   HCT 43.0 02/02/2022   MCV 90.3 02/02/2022   PLT 267 02/02/2022     Immunization status: Immunization History  Administered Date(s) Administered   Fluad Quad(high Dose 65+) 06/17/2019, 06/30/2021   Influenza Split 06/18/2011, 07/10/2012, 07/30/2013, 08/17/2013, 07/15/2014, 06/18/2015, 07/05/2016, 07/21/2020   Influenza, High Dose Seasonal PF 06/09/2011, 07/10/2012, 07/12/2017, 07/11/2018, 07/21/2020   Influenza,inj,Quad PF,6+ Mos 07/07/2015, 07/11/2016   Influenza-Unspecified 07/16/2014, 07/16/2018   Moderna SARS-COV2 Booster Vaccination 03/09/2021   Moderna Sars-Covid-2 Vaccination 03/09/2021   PFIZER(Purple Top)SARS-COV-2 Vaccination 11/21/2019, 12/17/2019, 08/04/2020   Pfizer Covid-19 Vaccine Bivalent Booster 47yr & up 07/20/2021, 04/14/2022   Pneumococcal Conjugate-13 12/18/2013   Pneumococcal Polysaccharide-23 07/10/2012   Tdap 07/10/2012, 03/22/2022   Zoster Recombinat (Shingrix) 12/16/2018, 04/16/2019   Zoster, Live 04/24/2008, 12/16/2018, 04/16/2019    Assessment:  COPD with severe airflow limitation FEV1 44% of predicted Chronic respiratory failure on 2LNC History of tobacco use disorder Breast Cancer - s/p lumpectomy and radiation July 2023  Plan/Recommendations:  We will send a new [place] [facility]  We will order an overnight oximetry test so we can see if you need to be wearing oxygen at night time.   Continue trelegy 1 puff once a day with albuterol as needed.   Call me if you need me sooner.   She is out of the window for lung cancer screening - quit in 1998.   Return to Care: Return in about 6 months (around 12/10/2022).   NLenice Llamas MD Pulmonary and [facility]

## 2022-06-10 ENCOUNTER — Telehealth: Payer: Self-pay | Admitting: Internal Medicine

## 2022-06-11 ENCOUNTER — Encounter: Payer: Self-pay | Admitting: Internal Medicine

## 2022-06-11 DIAGNOSIS — J449 Chronic obstructive pulmonary disease, unspecified: Secondary | ICD-10-CM

## 2022-06-14 NOTE — Telephone Encounter (Signed)
Called and spoke with patient.  Patient is aware neb machine order was sent to [facility] earlier today and [facility] had sent confirmation that they received ordered. Patient stated she is also waiting to do ONO.  Patient stated she would call and let us know when she completes ONO. Advised patient to call if she has any concerns.

## 2022-06-16 ENCOUNTER — Encounter: Payer: Self-pay | Admitting: Internal Medicine

## 2022-06-17 DIAGNOSIS — J449 Chronic obstructive pulmonary disease, unspecified: Secondary | ICD-10-CM | POA: Diagnosis not present

## 2022-06-21 ENCOUNTER — Telehealth: Payer: Self-pay | Admitting: Internal Medicine

## 2022-06-21 NOTE — Telephone Encounter (Signed)
Vallarie Mare, do you have a CMN on this pt?

## 2022-06-21 NOTE — Telephone Encounter (Signed)
Estill Bamberg, will you please f/u on this when ND back in clinic 06/22/22? Thanks!

## 2022-06-22 NOTE — Telephone Encounter (Signed)
This is in Desai's folder that was passed out this morning to be signed

## 2022-06-23 ENCOUNTER — Other Ambulatory Visit: Payer: Medicare Other

## 2022-06-23 ENCOUNTER — Ambulatory Visit: Payer: Medicare Other | Admitting: Hematology and Oncology

## 2022-06-28 ENCOUNTER — Telehealth: Payer: Self-pay | Admitting: Internal Medicine

## 2022-06-28 DIAGNOSIS — J449 Chronic obstructive pulmonary disease, unspecified: Secondary | ICD-10-CM

## 2022-06-29 ENCOUNTER — Encounter: Payer: Self-pay | Admitting: Internal Medicine

## 2022-06-30 ENCOUNTER — Ambulatory Visit: Payer: Medicare Other | Admitting: Hematology and Oncology

## 2022-06-30 ENCOUNTER — Other Ambulatory Visit: Payer: Medicare Other

## 2022-06-30 NOTE — Telephone Encounter (Signed)
Dr. Shearon Stalls, please see pt's email regarding vaccines. Thanks.

## 2022-07-01 NOTE — Telephone Encounter (Signed)
Patient called and would like her ONO results.   Please advise

## 2022-07-04 NOTE — Telephone Encounter (Signed)
Patient calling to follow up on this message regarding vaccines.    FYI, states she did get her new nebulizer machine.

## 2022-07-06 ENCOUNTER — Other Ambulatory Visit: Payer: Self-pay | Admitting: *Deleted

## 2022-07-06 DIAGNOSIS — Z170 Estrogen receptor positive status [ER+]: Secondary | ICD-10-CM

## 2022-07-07 ENCOUNTER — Inpatient Hospital Stay (HOSPITAL_BASED_OUTPATIENT_CLINIC_OR_DEPARTMENT_OTHER): Payer: Medicare Other | Admitting: Adult Health

## 2022-07-07 ENCOUNTER — Inpatient Hospital Stay: Payer: Medicare Other | Attending: Adult Health

## 2022-07-07 ENCOUNTER — Other Ambulatory Visit: Payer: Self-pay | Admitting: *Deleted

## 2022-07-07 ENCOUNTER — Other Ambulatory Visit: Payer: Self-pay

## 2022-07-07 ENCOUNTER — Telehealth: Payer: Self-pay | Admitting: *Deleted

## 2022-07-07 ENCOUNTER — Encounter: Payer: Self-pay | Admitting: Adult Health

## 2022-07-07 VITALS — BP 124/69 | HR 69 | Temp 97.5°F | Resp 18 | Ht 62.0 in | Wt 150.4 lb

## 2022-07-07 DIAGNOSIS — Z7982 Long term (current) use of aspirin: Secondary | ICD-10-CM | POA: Diagnosis not present

## 2022-07-07 DIAGNOSIS — C50411 Malignant neoplasm of upper-outer quadrant of right female breast: Secondary | ICD-10-CM | POA: Diagnosis not present

## 2022-07-07 DIAGNOSIS — Z79899 Other long term (current) drug therapy: Secondary | ICD-10-CM | POA: Diagnosis not present

## 2022-07-07 DIAGNOSIS — Z170 Estrogen receptor positive status [ER+]: Secondary | ICD-10-CM | POA: Diagnosis not present

## 2022-07-07 DIAGNOSIS — E119 Type 2 diabetes mellitus without complications: Secondary | ICD-10-CM | POA: Diagnosis not present

## 2022-07-07 DIAGNOSIS — M810 Age-related osteoporosis without current pathological fracture: Secondary | ICD-10-CM | POA: Diagnosis not present

## 2022-07-07 DIAGNOSIS — Z800 Family history of malignant neoplasm of digestive organs: Secondary | ICD-10-CM | POA: Insufficient documentation

## 2022-07-07 DIAGNOSIS — Z87891 Personal history of nicotine dependence: Secondary | ICD-10-CM | POA: Insufficient documentation

## 2022-07-07 DIAGNOSIS — Z08 Encounter for follow-up examination after completed treatment for malignant neoplasm: Secondary | ICD-10-CM | POA: Diagnosis not present

## 2022-07-07 DIAGNOSIS — Z85828 Personal history of other malignant neoplasm of skin: Secondary | ICD-10-CM | POA: Insufficient documentation

## 2022-07-07 DIAGNOSIS — Z7984 Long term (current) use of oral hypoglycemic drugs: Secondary | ICD-10-CM | POA: Insufficient documentation

## 2022-07-07 DIAGNOSIS — Z8042 Family history of malignant neoplasm of prostate: Secondary | ICD-10-CM | POA: Insufficient documentation

## 2022-07-07 LAB — CBC WITH DIFFERENTIAL (CANCER CENTER ONLY)
Abs Immature Granulocytes: 0.01 10*3/uL (ref 0.00–0.07)
Basophils Absolute: 0 10*3/uL (ref 0.0–0.1)
Basophils Relative: 0 %
Eosinophils Absolute: 0.2 10*3/uL (ref 0.0–0.5)
Eosinophils Relative: 3 %
HCT: 43.4 % (ref 36.0–46.0)
Hemoglobin: 14.9 g/dL (ref 12.0–15.0)
Immature Granulocytes: 0 %
Lymphocytes Relative: 22 %
Lymphs Abs: 1.4 10*3/uL (ref 0.7–4.0)
MCH: 30.6 pg (ref 26.0–34.0)
MCHC: 34.3 g/dL (ref 30.0–36.0)
MCV: 89.1 fL (ref 80.0–100.0)
Monocytes Absolute: 0.6 10*3/uL (ref 0.1–1.0)
Monocytes Relative: 9 %
Neutro Abs: 4.4 10*3/uL (ref 1.7–7.7)
Neutrophils Relative %: 66 %
Platelet Count: 249 10*3/uL (ref 150–400)
RBC: 4.87 MIL/uL (ref 3.87–5.11)
RDW: 12.7 % (ref 11.5–15.5)
WBC Count: 6.7 10*3/uL (ref 4.0–10.5)
nRBC: 0 % (ref 0.0–0.2)

## 2022-07-07 LAB — CMP (CANCER CENTER ONLY)
ALT: 15 U/L (ref 0–44)
AST: 17 U/L (ref 15–41)
Albumin: 4.5 g/dL (ref 3.5–5.0)
Alkaline Phosphatase: 64 U/L (ref 38–126)
Anion gap: 5 (ref 5–15)
BUN: 12 mg/dL (ref 8–23)
CO2: 33 mmol/L — ABNORMAL HIGH (ref 22–32)
Calcium: 10.1 mg/dL (ref 8.9–10.3)
Chloride: 102 mmol/L (ref 98–111)
Creatinine: 0.59 mg/dL (ref 0.44–1.00)
GFR, Estimated: >60 mL/min (ref >60)
Glucose, Bld: 119 mg/dL — ABNORMAL HIGH (ref 70–99)
Potassium: 4.5 mmol/L (ref 3.5–5.1)
Sodium: 140 mmol/L (ref 135–145)
Total Bilirubin: 0.4 mg/dL (ref 0.3–1.2)
Total Protein: 7.2 g/dL (ref 6.5–8.1)

## 2022-07-07 NOTE — Progress Notes (Signed)
SURVIVORSHIP VISIT:   BRIEF ONCOLOGIC HISTORY:  Oncology History  Malignant neoplasm of upper-outer quadrant of right breast in female, estrogen receptor positive ([place])  12/27/2021 Mammogram   Screening mammogram showed a right breast asymmetry warranting further investigation. Diagnostic mammogram showed 6 x 7 x 8 mm irregular hypoechoic right breast mass at 10 o'clock position 5 cm from the nipple.  No right axillary adenopathy   01/24/2022 Pathology Results   Pathology results showed right breast invasive ductal carcinoma, grade 2.  Prognostic showed ER 30%, positive weak staining intensity.  PR 0%, negative.  Ki-67 of 20%.  Tumor cells are negative for HER2 1+    01/28/2022 Initial Diagnosis   Malignant neoplasm of upper-outer quadrant of right breast in female, estrogen receptor positive ([place])   02/02/2022 Cancer Staging   Staging form: Breast, AJCC 8th Edition - Pathologic: Stage IB (pT1b, pN0, cM0, G2, ER-, PR-, HER2-) - Signed by [name], MD on 03/01/2022 Histologic grading system: 3 grade system   02/08/2022 Genetic Testing   Negative hereditary cancer genetic testing: no pathogenic variants detected in [facility] BRCAPlus Panel and [facility] CustomNext-Cancer +RNAinsight.  Report dates are February 08, 2022 and February 13, 2022.   The BRCAplus panel offered by [employer] and includes sequencing and deletion/duplication analysis for the following 8 genes: ATM, BRCA1, BRCA2, CDH1, CHEK2, PALB2, PTEN, and TP53.  The CustomNext-Cancer+RNAinsight panel offered by [facility] includes sequencing and rearrangement analysis for the following 47 genes:  APC, ATM, AXIN2, BARD1, BMPR1A, BRCA1, BRCA2, BRIP1, CDH1, CDK4, CDKN2A, CHEK2, DICER1, EPCAM, GREM1, HOXB13, MEN1, MLH1, MSH2, MSH3, MSH6, MUTYH, NBN, NF1, NF2, NTHL1, PALB2, PMS2, POLD1, POLE, PTEN, RAD51C, RAD51D, RECQL, RET, SDHA, SDHAF2, SDHB, SDHC, SDHD, SMAD4, SMARCA4, STK11, TP53, TSC1, TSC2, and VHL.  RNA data is routinely analyzed for  use in variant interpretation for all genes.   02/16/2022 Surgery   Patient had right breast lumpectomy, deep sentinel lymph node biopsy on 02/16/2022.  Pathology showed invasive ductal carcinoma, 0.8 cm in maximal dimension, and 0.4 cm of the closest resection margin, DCIS coming to less than 0.1 cm of the closest resection margin.  No metastatic carcinoma in 4 out of 4 lymph nodes.  Prior prognostic showed ER 30% positive weak staining, PR 0% negative.  Repeat prognostics from final pathology showed ER negative, PR negative, KI of 15% and HER2 negative   03/31/2022 - 04/28/2022 Radiation Therapy   Site Technique Total Dose (Gy) Dose per Fx (Gy) Completed Fx Beam Energies  Breast, Right: Breast_R 3D 40.05/40.05 2.67 15/15 10XFFF  Breast, Right: Breast_R_Bst 3D 10/10 2 5/5 6X, 10X       INTERVAL HISTORY:  Ms. Akamine to review her survivorship care plan detailing her treatment course for breast cancer, as well as monitoring long-term side effects of that treatment, education regarding health maintenance, screening, and overall wellness and health promotion.     Overall, Ms. Younes reports feeling quite well.  She has some residual fatigue since completing radiation therapy but otherwise is feeling well.  She continues to do so so monitoring every 3 months and notes a little bit of thickness in her axillary area and was recommended to massage this area by the physical therapy team when she saw them last.  She would like to connect with a nutritionist but otherwise she is doing well today.  REVIEW OF SYSTEMS:  Review of Systems  Constitutional:  Positive for fatigue. Negative for appetite change, chills, fever and unexpected weight change.  HENT:  Negative for hearing loss, lump/mass and trouble swallowing.   Eyes:  Negative for eye problems and icterus.  Respiratory:  Negative for chest tightness, cough and shortness of breath.   Cardiovascular:  Negative for chest pain, leg swelling and  palpitations.  Gastrointestinal:  Negative for abdominal distention, abdominal pain, constipation, diarrhea, nausea and vomiting.  Endocrine: Negative for hot flashes.  Genitourinary:  Negative for difficulty urinating.   Musculoskeletal:  Negative for arthralgias.  Skin:  Negative for itching and rash.  Neurological:  Negative for dizziness, extremity weakness, headaches and numbness.  Hematological:  Negative for adenopathy. Does not bruise/bleed easily.  Psychiatric/Behavioral:  Negative for depression. The patient is not nervous/anxious.    Breast: Denies any new nodularity, masses, tenderness, nipple changes, or nipple discharge.      ONCOLOGY TREATMENT TEAM:  1. Surgeon:  Dr. Ninfa Linden at [facility] 2. Medical Oncologist: Dr. Chryl Heck  3. Radiation Oncologist: Dr. Isidore Moos    PAST MEDICAL/SURGICAL HISTORY:  Past Medical History:  Diagnosis Date   Cancer ([place])    skin   COPD (chronic obstructive pulmonary disease) ([place])    DM (diabetes mellitus) ([place])    Dyspnea    increased activity    Family history of ovarian cancer 02/03/2022   Family history of stomach cancer 02/03/2022   Genital herpes    GERD (gastroesophageal reflux disease)    Hyperlipidemia    Migraines    Osteoporosis    Unspecified essential hypertension    Past Surgical History:  Procedure Laterality Date   BREAST BIOPSY     BREAST EXCISIONAL BIOPSY     over 20 years ago- benign   BREAST LUMPECTOMY WITH RADIOACTIVE SEED AND SENTINEL LYMPH NODE BIOPSY Right 02/16/2022   Procedure: RIGHT BREAST LUMPECTOMY WITH RADIOACTIVE SEED AND SENTINEL LYMPH NODE BIOPSY;  Surgeon: [name], MD;  Location: [facility];  Service: General;  Laterality: Right;   BUNIONECTOMY Left 2004   and hammer toe repair Left foot   COLON SURGERY     removal of polyps   DENTAL SURGERY     RETINAL TEAR REPAIR CRYOTHERAPY       ALLERGIES:  Allergies  Allergen Reactions   Telithromycin     GI Upset    Bacitracin  Rash    Scarring     Levofloxacin Rash     CURRENT MEDICATIONS:  Outpatient Encounter Medications as of 07/07/2022  Medication Sig Note   acetaminophen (TYLENOL) 325 MG tablet Take 650 mg by mouth every 6 (six) hours as needed for moderate pain.    albuterol (PROVENTIL) (2.5 MG/3ML) 0.083% nebulizer solution Take 3 mLs (2.5 mg total) by nebulization every 6 (six) hours as needed for wheezing or shortness of breath. (Patient taking differently: Take 2.5 mg by nebulization daily.)    aspirin EC 81 MG EC tablet Take 81 mg by mouth daily.    cetirizine (ZYRTEC) 10 MG tablet Take 10 mg by mouth daily.    Cholecalciferol (VITAMIN D3) 1000 UNITS CAPS Take 1,000 Units by mouth daily.    COVID-19 mRNA bivalent vaccine, Pfizer, (PFIZER COVID-19 VAC BIVALENT) injection Inject into the muscle.    Cranberry 500 MG TABS Take 500 mg by mouth daily.    denosumab (PROLIA) 60 MG/ML SOSY injection Inject 60 mg into the skin every 6 (six) months.    fluticasone (FLONASE) 50 MCG/ACT nasal spray Place 1 spray into both nostrils daily.    Fluticasone-Umeclidin-Vilant (TRELEGY ELLIPTA) 100-62.5-25 MCG/ACT AEPB Inhale 1 puff into the lungs daily.  ibuprofen (ADVIL) 200 MG tablet Take 200 mg by mouth every 8 (eight) hours as needed for moderate pain.    IGLUCOSE TEST STRIPS test strip See admin instructions.    L-Lysine 500 MG CAPS Take 500 mg by mouth daily.    Lancets (STERILANCE TL) MISC See admin instructions.    losartan (COZAAR) 50 MG tablet TAKE 1 TABLET(50 MG) BY MOUTH TWICE DAILY    metFORMIN (GLUCOPHAGE-XR) 500 MG 24 hr tablet Take 1,000 mg by mouth daily after supper.    Multiple Vitamins-Minerals (CENTRUM SILVER PO) Take 1 tablet by mouth daily.      omeprazole (PRILOSEC) 20 MG capsule Take 20 mg by mouth every other day.     OXYGEN Inhale 2 L into the lungs daily as needed (SOB).    polyethylene glycol (MIRALAX / GLYCOLAX) 17 g packet Take 17 g by mouth every other day.    rosuvastatin (CRESTOR) 10  MG tablet Take 1 tablet (10 mg total) by mouth at bedtime.    albuterol (VENTOLIN HFA) 108 (90 Base) MCG/ACT inhaler Inhale 2 puffs into the lungs every 6 (six) hours as needed for wheezing or shortness of breath. (Patient not taking: Reported on 07/07/2022)    Melatonin 5 MG CAPS Take 5 mg by mouth daily as needed (sleep). (Patient not taking: Reported on 07/07/2022) 02/08/2022: Has med but has never taken    traMADol (ULTRAM) 50 MG tablet Take 1 tablet (50 mg total) by mouth every 6 (six) hours as needed. (Patient not taking: Reported on 07/07/2022)    valACYclovir (VALTREX) 500 MG tablet Take 500 mg by mouth 2 (two) times daily as needed (outbreaks). For 3 days (Patient not taking: Reported on 07/07/2022)    [DISCONTINUED] levocetirizine (XYZAL) 5 MG tablet Take 5 mg by mouth every evening.      No facility-administered encounter medications on file as of 07/07/2022.     ONCOLOGIC FAMILY HISTORY:  Family History  Problem Relation Age of Onset   COPD Mother        deceased   Heart disease Mother    Alcohol abuse Father    Alcohol abuse Sister        deceased   Heart attack Sister    Alcohol abuse Brother        deceased   Cancer Maternal Aunt        unknown type; d. 71   Cancer Maternal Uncle        unknown type; d. 74s   Ovarian cancer Paternal Aunt        d. 11   Stomach cancer Paternal Aunt 6   Breast cancer Neg Hx      SOCIAL HISTORY:  Social History   Socioeconomic History   Marital status: Widowed    Spouse name: Not on file   Number of children: Not on file   Years of education: Not on file   Highest education level: Not on file  Occupational History   Occupation: Development worker, community  Tobacco Use   Smoking status: Former    Packs/day: 2.00    Years: 25.00    Total pack years: 50.00    Types: Cigarettes    Quit date: 10/17/1996    Years since quitting: 25.7   Smokeless tobacco: Never  Vaping Use   Vaping Use: Never used  Substance and Sexual Activity    Alcohol use: No    Alcohol/week: 0.0 standard drinks of alcohol   Drug use: No   Sexual activity:  Not Currently    Birth control/protection: Post-menopausal  Other Topics Concern   Not on file  Social History Narrative   No children   Social Determinants of Health   Financial Resource Strain: Not on file  Food Insecurity: Not on file  Transportation Needs: Not on file  Physical Activity: Not on file  Stress: Not on file  Social Connections: Not on file  Intimate Partner Violence: Not on file     OBSERVATIONS/OBJECTIVE:  BP (!) 156/94 (BP Location: Left Arm, Patient Position: Sitting)   Pulse 98   Temp (!) 97.5 F (36.4 C) (Tympanic)   Resp 18   Ht 5' 2"  (1.575 m)   Wt 150 lb 6.4 oz (68.2 kg)   SpO2 94%   BMI 27.51 kg/m  GENERAL: Patient is a well appearing female in no acute distress HEENT:  Sclerae anicteric.  Oropharynx clear and moist. No ulcerations or evidence of oropharyngeal candidiasis. Neck is supple.  NODES:  No cervical, supraclavicular, or axillary lymphadenopathy palpated.  BREAST EXAM: Left breast is benign right breast status postlumpectomy and radiation no sign of local recurrence noted. LUNGS:  Clear to auscultation bilaterally.  No wheezes or rhonchi. HEART:  Regular rate and rhythm. No murmur appreciated. ABDOMEN:  Soft, nontender.  Positive, normoactive bowel sounds. No organomegaly palpated. MSK:  No focal spinal tenderness to palpation. Full range of motion bilaterally in the upper extremities. EXTREMITIES:  No peripheral edema.   SKIN:  Clear with no obvious rashes or skin changes. No nail dyscrasia. NEURO:  Nonfocal. Well oriented.  Appropriate affect.\   LABORATORY DATA:  None for this visit.  DIAGNOSTIC IMAGING:  None for this visit.      ASSESSMENT AND PLAN:  Ms.. Fackrell is a pleasant 76 y.o. female with Stage IB right breast invasive ductal carcinoma, ER-/PR-/HER2-, diagnosed in 01/2022, treated with lumpectomy and adjuvant radiation  therapy.  She presents to the Survivorship Clinic for our initial meeting and routine follow-up post-completion of treatment for breast cancer.    1. Stage IB right breast cancer:  Ms. Nebergall is continuing to recover from definitive treatment for breast cancer. She will follow-up with her medical oncologist, Dr. Chryl Heck with history and physical exam per surveillance protocol.  Her mammogram is due 12/2022; orders placed today. Today, a comprehensive survivorship care plan and treatment summary was reviewed with the patient today detailing her breast cancer diagnosis, treatment course, potential late/long-term effects of treatment, appropriate follow-up care with recommendations for the future, and patient education resources.  A copy of this summary, along with a letter will be sent to the patient's primary care provider via mail/fax/In Basket message after today's visit.    2.  Nutrition request: I gave her information on support services including the nutrition department and she is going to call the phone number to see if she can schedule one-on-one time with the nutritionist.  I also gave her information about the nutrition class.  3. Bone health:  Given Ms. Nosal's age/history of breast cancer, she is at risk for bone demineralization.  Her last DEXA scan was 10/21/2021, which showed osteoporosis with a t score of -3.2 in the spine and a t score of -3.5 in the forearm.  She follows with Dr. Buddy Duty for this and receives Prolia every 6 months..   She was given education on specific activities to promote bone health.  4. Cancer screening:  Due to Ms. Boyar's history and her age, she should receive screening for skin cancers, colon  cancer.  She has a history of skin cancer and needs follow-up with dermatology I placed a referral for this to take place at [facility] dermatology.  I discussed with her that there is a long wait time for new a patient appointments.  The information and recommendations are  listed on the patient's comprehensive care plan/treatment summary and were reviewed in detail with the patient.    5. Health maintenance and wellness promotion: Ms. Sova was encouraged to consume 5-7 servings of fruits and vegetables per day. We reviewed the "Nutrition Rainbow" handout.  She was also encouraged to engage in moderate to vigorous exercise for 30 minutes per day most days of the week. We discussed the [employer] fitness program, which is designed for cancer survivors to help them become more physically fit after cancer treatments.  She was instructed to limit her alcohol consumption and continue to abstain from tobacco use.     6. Support services/counseling: It is not uncommon for this period of the patient's cancer care trajectory to be one of many emotions and stressors. She was given information regarding our available services and encouraged to contact me with any questions or for help enrolling in any of our support group/programs.    Follow up instructions:    -Return to cancer center in 6 months for f/u with Dr. Chryl Heck  -Mammogram due in 12/2022 -Follow up with surgery in one year -She is welcome to return back to the Survivorship Clinic at any time; no additional follow-up needed at this time.  -Consider referral back to survivorship as a long-term survivor for continued surveillance  The patient was provided an opportunity to ask questions and all were answered. The patient agreed with the plan and demonstrated an understanding of the instructions.   Total encounter time:40 minutes*in face-to-face visit time, chart review, lab review, care coordination, order entry, and documentation of the encounter time.    Wilber Bihari, NP 07/07/22 9:43 AM Medical Oncology and Hematology [facility] [place], [place] [address] Tel. [phone]    Fax. [phone]  *Total Encounter Time as defined by the Centers for Medicare and Medicaid  Services includes, in addition to the face-to-face time of a patient visit (documented in the note above) non-face-to-face time: obtaining and reviewing outside history, ordering and reviewing medications, tests or procedures, care coordination (communications with other health care professionals or caregivers) and documentation in the medical record.

## 2022-07-07 NOTE — Telephone Encounter (Signed)
Referral to [facility] was faxed to [phone]. Receipt of confirmation was received.

## 2022-07-12 ENCOUNTER — Other Ambulatory Visit (HOSPITAL_BASED_OUTPATIENT_CLINIC_OR_DEPARTMENT_OTHER): Payer: Self-pay

## 2022-07-12 MED ORDER — AREXVY 120 MCG/0.5ML IM SUSR
INTRAMUSCULAR | 0 refills | Status: DC
  Filled 2022-07-12: qty 1, 1d supply, fill #0

## 2022-07-15 DIAGNOSIS — Z23 Encounter for immunization: Secondary | ICD-10-CM | POA: Diagnosis not present

## 2022-07-18 DIAGNOSIS — Z23 Encounter for immunization: Secondary | ICD-10-CM | POA: Diagnosis not present

## 2022-08-05 ENCOUNTER — Other Ambulatory Visit: Payer: Self-pay | Admitting: *Deleted

## 2022-08-05 DIAGNOSIS — Z170 Estrogen receptor positive status [ER+]: Secondary | ICD-10-CM

## 2022-08-05 DIAGNOSIS — C50411 Malignant neoplasm of upper-outer quadrant of right female breast: Secondary | ICD-10-CM

## 2022-08-08 ENCOUNTER — Other Ambulatory Visit: Payer: Self-pay

## 2022-08-08 DIAGNOSIS — J449 Chronic obstructive pulmonary disease, unspecified: Secondary | ICD-10-CM | POA: Diagnosis not present

## 2022-08-08 DIAGNOSIS — E119 Type 2 diabetes mellitus without complications: Secondary | ICD-10-CM | POA: Diagnosis not present

## 2022-08-08 DIAGNOSIS — E785 Hyperlipidemia, unspecified: Secondary | ICD-10-CM | POA: Diagnosis not present

## 2022-08-08 DIAGNOSIS — I10 Essential (primary) hypertension: Secondary | ICD-10-CM | POA: Diagnosis not present

## 2022-08-08 MED ORDER — ROSUVASTATIN CALCIUM 10 MG PO TABS: 10.0000 mg | ORAL_TABLET | Freq: Every evening | ORAL | 0 refills | Status: DC

## 2022-08-11 ENCOUNTER — Inpatient Hospital Stay: Payer: Medicare Other | Attending: Adult Health | Admitting: Dietician

## 2022-08-11 NOTE — Progress Notes (Signed)
Patient attended Nutrition 101 class 08/11/22

## 2022-08-12 ENCOUNTER — Inpatient Hospital Stay: Payer: Medicare Other | Admitting: Dietician

## 2022-08-12 NOTE — Progress Notes (Signed)
Nutrition   Reason for Assessment: pt request  76 year old female has completed treatment for breast cancer. She is in survivorship.    Met with patient in office today. She reports finding the Nutrition 101 class attended last night (10/26) informative and beneficial. Patient is here today to discuss recommendations for sore mouth associated with canker sore flares and foods best for COPD.    Anthropometrics:   Height: 5'2" Weight: 150 lb 6.4 oz (07/07/22) UBW: 151-154 lb  BMI: 27.51   INTERVENTION:  Discussed soft moist high protein foods for ease of intake and encouraged keeping heat/serve meals available to prepare when experiencing increased shortness of breath/fatigue - handout with ideas provided  Encouraged pt take acyclovir as prescribed  Suggested trying baking soda salt water rinses several times daily - handout on oral care + recipe provided    Next Visit: No follow-up scheduled  Please consider referral to [facility] for further nutrition education as needed

## 2022-08-15 ENCOUNTER — Encounter: Payer: Self-pay | Admitting: Cardiology

## 2022-08-29 ENCOUNTER — Ambulatory Visit: Payer: Medicare Other | Attending: Hematology and Oncology

## 2022-08-29 VITALS — Wt 149.0 lb

## 2022-08-29 DIAGNOSIS — Z483 Aftercare following surgery for neoplasm: Secondary | ICD-10-CM | POA: Insufficient documentation

## 2022-08-29 NOTE — Therapy (Signed)
OUTPATIENT PHYSICAL THERAPY SOZO SCREENING NOTE   Patient Name: Yolanda Morris MRN: [id number] DOB:Apr 22, 1946, 76 y.o., female Today's Date: 08/29/2022  PCP: [name], MD REFERRING PROVIDER: [name], MD   PT End of Session - 08/29/22 1043     Visit Number 13   # unchange due to screen only   PT Start Time 4128    PT Stop Time 1047    PT Time Calculation (min) 5 min    Activity Tolerance Patient tolerated treatment well    Behavior During Therapy WFL for tasks assessed/performed             Past Medical History:  Diagnosis Date   Cancer ([place])    skin   COPD (chronic obstructive pulmonary disease) ([place])    DM (diabetes mellitus) ([place])    Dyspnea    increased activity    Family history of ovarian cancer 02/03/2022   Family history of stomach cancer 02/03/2022   Genital herpes    GERD (gastroesophageal reflux disease)    Hyperlipidemia    Migraines    Osteoporosis    Unspecified essential hypertension    Past Surgical History:  Procedure Laterality Date   BREAST BIOPSY     BREAST EXCISIONAL BIOPSY     over 20 years ago- benign   BREAST LUMPECTOMY WITH RADIOACTIVE SEED AND SENTINEL LYMPH NODE BIOPSY Right 02/16/2022   Procedure: RIGHT BREAST LUMPECTOMY WITH RADIOACTIVE SEED AND SENTINEL LYMPH NODE BIOPSY;  Surgeon: [name], MD;  Location: [facility];  Service: General;  Laterality: Right;   BUNIONECTOMY Left 2004   and hammer toe repair Left foot   COLON SURGERY     removal of polyps   [name]     Patient Active Problem List   Diagnosis Date Noted   Genetic testing 02/09/2022   Family history of ovarian cancer 02/03/2022   Family history of stomach cancer 02/03/2022   Malignant neoplasm of upper-outer quadrant of right breast in female, estrogen receptor positive ([place]) 01/28/2022   Medication management 07/21/2020   Healthcare maintenance 07/21/2020   Body aches 07/22/2019   Chronic respiratory  failure with hypoxia ([place]) 06/17/2019   Allergic rhinitis 11/13/2018   Shortness of breath 10/23/2018   COPD exacerbation ([place]) 01/07/2015   Cough 07/05/2013   Acute sinusitis 02/12/2013   Acute bronchitis 07/08/2011   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) 01/05/2011    REFERRING DIAG: right breast cancer at risk for lymphedema  THERAPY DIAG: Aftercare following surgery for neoplasm  PERTINENT HISTORY: Patient was diagnosed on 12/27/2021 with right grade 2 invasive ductal carcinoma breast cancer. It measures 8 mm and is located in the upper outer quadrant. It is weakly ER positive, PR negative, and HER2 negative with a Ki67 of 20%. She has stage 4 COPD and is on 2 liters of oxygen. Post Rt breast lumpectomy with no carcinoma in 4 removed nodes on 02/16/22. Will be having radiation.   PRECAUTIONS: right UE Lymphedema risk, None  SUBJECTIVE: Pt returns for her 3 month L-Dex screen.   PAIN:  Are you having pain? No  SOZO SCREENING: Patient was assessed today using the SOZO machine to determine the lymphedema index score. This was compared to her baseline score. It was determined that she is within the recommended range when compared to her baseline and no further action is needed at this time. She will continue SOZO screenings. These are done every 3 months for  2 years post operatively followed by every 6 months for 2 years, and then annually.   L-DEX FLOWSHEETS - 08/29/22 1000       L-DEX LYMPHEDEMA SCREENING   Measurement Type Unilateral    L-DEX MEASUREMENT EXTREMITY Upper Extremity    POSITION  Standing    DOMINANT SIDE Right    At Risk Side Right    BASELINE SCORE (UNILATERAL) -9    L-DEX SCORE (UNILATERAL) -6.2    VALUE CHANGE (UNILAT) 2.8               Otelia Limes, PTA 08/29/2022, 10:49 AM

## 2022-09-22 DIAGNOSIS — I10 Essential (primary) hypertension: Secondary | ICD-10-CM | POA: Diagnosis not present

## 2022-09-22 DIAGNOSIS — D0511 Intraductal carcinoma in situ of right breast: Secondary | ICD-10-CM | POA: Diagnosis not present

## 2022-09-22 DIAGNOSIS — M810 Age-related osteoporosis without current pathological fracture: Secondary | ICD-10-CM | POA: Diagnosis not present

## 2022-09-22 DIAGNOSIS — E1169 Type 2 diabetes mellitus with other specified complication: Secondary | ICD-10-CM | POA: Diagnosis not present

## 2022-09-22 DIAGNOSIS — J449 Chronic obstructive pulmonary disease, unspecified: Secondary | ICD-10-CM | POA: Diagnosis not present

## 2022-09-22 DIAGNOSIS — E785 Hyperlipidemia, unspecified: Secondary | ICD-10-CM | POA: Diagnosis not present

## 2022-09-26 DIAGNOSIS — E119 Type 2 diabetes mellitus without complications: Secondary | ICD-10-CM | POA: Diagnosis not present

## 2022-09-26 DIAGNOSIS — J449 Chronic obstructive pulmonary disease, unspecified: Secondary | ICD-10-CM | POA: Diagnosis not present

## 2022-09-26 DIAGNOSIS — E785 Hyperlipidemia, unspecified: Secondary | ICD-10-CM | POA: Diagnosis not present

## 2022-09-26 DIAGNOSIS — I10 Essential (primary) hypertension: Secondary | ICD-10-CM | POA: Diagnosis not present

## 2022-09-27 ENCOUNTER — Ambulatory Visit: Payer: Medicare Other | Admitting: Cardiology

## 2022-09-27 ENCOUNTER — Encounter: Payer: Self-pay | Admitting: Cardiology

## 2022-09-27 VITALS — BP 168/77 | HR 83 | Resp 16 | Ht 62.0 in | Wt 153.2 lb

## 2022-09-27 DIAGNOSIS — I6522 Occlusion and stenosis of left carotid artery: Secondary | ICD-10-CM

## 2022-09-27 DIAGNOSIS — E119 Type 2 diabetes mellitus without complications: Secondary | ICD-10-CM

## 2022-09-27 DIAGNOSIS — E782 Mixed hyperlipidemia: Secondary | ICD-10-CM | POA: Diagnosis not present

## 2022-09-27 DIAGNOSIS — I6523 Occlusion and stenosis of bilateral carotid arteries: Secondary | ICD-10-CM

## 2022-09-27 DIAGNOSIS — I10 Essential (primary) hypertension: Secondary | ICD-10-CM

## 2022-09-27 NOTE — Patient Instructions (Signed)
Consider [place] to check your rhythm.

## 2022-09-27 NOTE — Progress Notes (Signed)
ID:  Yolanda Morris, DOB 01-14-1946, MRN [id number]  PCP:  [name], MD  Cardiologist:  Rex Kras, DO, [facility] (established care 07/01/21 )  Date: 09/27/22 Last Office Visit: 03/25/2022  Chief Complaint  Patient presents with   Hyperlipidemia   Follow-up    6 months- Carotid disease, lipid    HPI  Yolanda Morris is a 76 y.o. female whose past medical history and cardiovascular risk factors include: Right Breast IDC (s/p surgery and radiation), bilateral carotid artery atherosclerosis, hypertension, COPD on home oxygen 2L [place], diabetes, hyperlipidemia, genital herpes, history of migraines, former smoker, postmenopausal female, advance age.   Patient was referred to the practice for evaluation of dizziness and chest pain.  She underwent ischemic workup as outlined below.  She had a carotid duplex which noted bilateral carotid stenosis less than 50%.  She is currently on statins and she presents for 46-monthfollow-up visit.  Over the last 6 months patient has not had any anginal discomfort, heart failure symptoms, lightheaded/dizziness, near-syncope or syncope, visual change defects or focal neurological deficits.  Patient states that her breast cancer was treated with surgical intervention and radiation.  She intermittently notices episodes of palpitations and her blood pressure monitor at times reports irregular rhythm.  She does not have a corresponding rhythm strip to confirm the particular diagnoses.  In the frequency of when she notices irregular rhythm on the blood pressure monitor is very infrequent.   FUNCTIONAL STATUS: No structured exercise program or daily routine.   ALLERGIES: Allergies  Allergen Reactions   Telithromycin     GI Upset    Bacitracin Rash    Scarring     Levofloxacin Rash    MEDICATION LIST PRIOR TO VISIT: Current Meds  Medication Sig   acetaminophen (TYLENOL) 325 MG tablet Take 650 mg by mouth every 6 (six) hours as needed for moderate  pain.   albuterol (PROVENTIL) (2.5 MG/3ML) 0.083% nebulizer solution Take 3 mLs (2.5 mg total) by nebulization every 6 (six) hours as needed for wheezing or shortness of breath. (Patient taking differently: Take 2.5 mg by nebulization daily.)   albuterol (VENTOLIN HFA) 108 (90 Base) MCG/ACT inhaler Inhale 2 puffs into the lungs every 6 (six) hours as needed for wheezing or shortness of breath.   aspirin EC 81 MG EC tablet Take 81 mg by mouth daily.   cetirizine (ZYRTEC) 10 MG tablet Take 10 mg by mouth daily.   Cholecalciferol (VITAMIN D3) 1000 UNITS CAPS Take 1,000 Units by mouth daily.   COVID-19 mRNA bivalent vaccine, Pfizer, (PFIZER COVID-19 VAC BIVALENT) injection Inject into the muscle.   Cranberry 500 MG TABS Take 500 mg by mouth daily.   denosumab (PROLIA) 60 MG/ML SOSY injection Inject 60 mg into the skin every 6 (six) months.   fluticasone (FLONASE) 50 MCG/ACT nasal spray Place 1 spray into both nostrils daily.   Fluticasone-Umeclidin-Vilant (TRELEGY ELLIPTA) 100-62.5-25 MCG/ACT AEPB Inhale 1 puff into the lungs daily.   ibuprofen (ADVIL) 200 MG tablet Take 200 mg by mouth every 8 (eight) hours as needed for moderate pain.   IGLUCOSE TEST STRIPS test strip See admin instructions.   L-Lysine 500 MG CAPS Take 500 mg by mouth daily.   Lancets (STERILANCE TL) MISC See admin instructions.   losartan (COZAAR) 50 MG tablet TAKE 1 TABLET(50 MG) BY MOUTH TWICE DAILY   Melatonin 5 MG CAPS Take 3 mg by mouth daily as needed (sleep).   metFORMIN (GLUCOPHAGE-XR) 500 MG 24 hr tablet  Take 1,000 mg by mouth daily after supper.   Multiple Vitamins-Minerals (CENTRUM SILVER PO) Take 1 tablet by mouth daily.     omeprazole (PRILOSEC) 20 MG capsule Take 20 mg by mouth every other day.    OXYGEN Inhale 2 L into the lungs daily as needed (SOB).   polyethylene glycol (MIRALAX / GLYCOLAX) 17 g packet Take 17 g by mouth every other day.   rosuvastatin (CRESTOR) 10 MG tablet Take 1 tablet (10 mg total) by mouth  at bedtime.   RSV vaccine recomb adjuvanted (AREXVY) 120 MCG/0.5ML injection Inject into the muscle.   traMADol (ULTRAM) 50 MG tablet Take 1 tablet (50 mg total) by mouth every 6 (six) hours as needed.   valACYclovir (VALTREX) 500 MG tablet Take 500 mg by mouth 2 (two) times daily as needed (outbreaks). For 3 days     PAST MEDICAL HISTORY: Past Medical History:  Diagnosis Date   Cancer ([place])    skin   COPD (chronic obstructive pulmonary disease) ([facility])    DM (diabetes mellitus) ([place])    Dyspnea    increased activity    Family history of ovarian cancer 02/03/2022   Family history of stomach cancer 02/03/2022   Genital herpes    GERD (gastroesophageal reflux disease)    Hyperlipidemia    Migraines    Osteoporosis    Unspecified essential hypertension     PAST SURGICAL HISTORY: Past Surgical History:  Procedure Laterality Date   BREAST BIOPSY     BREAST EXCISIONAL BIOPSY     over 20 years ago- benign   BREAST LUMPECTOMY WITH RADIOACTIVE SEED AND SENTINEL LYMPH NODE BIOPSY Right 02/16/2022   Procedure: RIGHT BREAST LUMPECTOMY WITH RADIOACTIVE SEED AND SENTINEL LYMPH NODE BIOPSY;  Surgeon: [name], MD;  Location: [facility];  Service: General;  Laterality: Right;   BUNIONECTOMY Left 2004   and hammer toe repair Left foot   COLON SURGERY     removal of polyps   DENTAL SURGERY     RETINAL TEAR REPAIR CRYOTHERAPY      FAMILY HISTORY: The patient family history includes Alcohol abuse in her brother, father, and sister; COPD in her mother; Cancer in her maternal aunt and maternal uncle; Heart attack in her sister; Heart disease in her mother; Ovarian cancer in her paternal aunt; Stomach cancer (age of onset: 58) in her paternal aunt.  SOCIAL HISTORY:  The patient  reports that she quit smoking about 25 years ago. Her smoking use included cigarettes. She has a 50.00 pack-year smoking history. She has never used smokeless tobacco. She reports that she does not drink alcohol and  does not use drugs.  REVIEW OF SYSTEMS: Review of Systems  Constitutional: Negative for chills and fever.  HENT:  Negative for ear pain, hoarse voice and nosebleeds.   Eyes:  Negative for discharge, double vision and pain.  Cardiovascular:  Negative for chest pain, claudication, dyspnea on exertion, leg swelling, near-syncope, orthopnea, palpitations, paroxysmal nocturnal dyspnea and syncope.  Respiratory:  Positive for shortness of breath (chronic and stable). Negative for hemoptysis.   Musculoskeletal:  Negative for muscle cramps and myalgias.  Gastrointestinal:  Negative for abdominal pain, constipation, diarrhea, hematemesis, hematochezia, melena, nausea and vomiting.  Neurological:  Negative for dizziness, light-headedness and vertigo.    PHYSICAL EXAM:    09/27/2022   10:34 AM 08/29/2022   10:40 AM 07/07/2022   10:32 AM  Vitals with BMI  Height _0     Weight 153 lbs 3 oz 149  lbs   BMI 67.67    Systolic 209  470  Diastolic 77  69  Pulse 83  69   Physical Exam  Constitutional: No distress.  Age appropriate, hemodynamically stable.   Neck: No JVD present.  Cardiovascular: Normal rate, regular rhythm, S1 normal, S2 normal, intact distal pulses and normal pulses. Exam reveals no gallop, no S3 and no S4.  Murmur heard. Holosystolic murmur is present with a grade of 3/6 at the apex. Pulses:      Dorsalis pedis pulses are 2+ on the right side and 2+ on the left side.       Posterior tibial pulses are 2+ on the right side and 2+ on the left side.  Pulmonary/Chest: Effort normal and breath sounds normal. No stridor. She has no wheezes. She has no rales.  Thoracic spine illustrates kyphosis.  Abdominal: Soft. Bowel sounds are normal. She exhibits no distension. There is no abdominal tenderness.  Musculoskeletal:        General: No edema.     Cervical back: Neck supple.  Neurological: She is alert and oriented to person, place, and time. She has intact cranial nerves (2-12).   Skin: Skin is warm and moist.   CARDIAC DATABASE: EKG: 09/27/2022: Normal sinus rhythm, 67 bpm, low voltage, poor R wave progression, without underlying injury pattern  Echocardiogram: 07/27/2021: Left ventricle cavity is normal in size. Mild concentric hypertrophy of the left ventricle. Normal global wall motion.  Normal LV systolic function with EF 61%. Doppler evidence of grade I (impaired) diastolic dysfunction, normal LAP.  Mild (Grade I) mitral regurgitation. Estimated right atrial pressure 8 mmHg.   Stress Testing: Modified Bruce/Lexiscan Tetrofosmin stress test 07/14/2021: Lexiscan/modified Bruce nuclear stress test performed using 1-day protocol. Patient achieved 2.2 METS on modified Bruce protocol and reached 108% MPHR. Stress EKG at 108% MPHR showed sinus tachycardia, no ST-T wave abnormality. Normal myocardial perfusion. Stress LVEF 63%. Low risk study.  Heart Catheterization: None  Carotid artery duplex 07/27/2021:  Duplex suggests stenosis in the right internal carotid artery (1-15%).  Duplex suggests stenosis in the right external carotid artery (<50%). Duplex suggests stenosis in the left internal carotid artery (16-49%).  Duplex suggests stenosis in the left external carotid artery.  There is homogenous plaque in the right carotid artery and heterogeneous plaque in the left carotid artery. Antegrade right vertebral artery flow. Antegrade left vertebral artery flow. Follow up in one year is appropriate if clinically indicated.  LABORATORY DATA:    Latest Ref Rng & Units 07/07/2022    8:55 AM 02/02/2022   12:12 PM  CBC  WBC 4.0 - 10.5 K/uL 6.7  7.7   Hemoglobin 12.0 - 15.0 g/dL 14.9  14.1   Hematocrit 36.0 - 46.0 % 43.4  43.0   Platelets 150 - 400 K/uL 249  267       Latest Ref Rng & Units 07/07/2022    8:55 AM 02/02/2022   12:12 PM 12/30/2021    9:28 AM  CMP  Glucose 70 - 99 mg/dL 119  202  139   BUN 8 - 23 mg/dL _0 Creatinine 0.44 - 1.00 mg/dL  0.59  0.64  0.63   Sodium 135 - 145 mmol/L 140  140  141   Potassium 3.5 - 5.1 mmol/L 4.5  3.9  4.3   Chloride 98 - 111 mmol/L 102  101  99   CO2 22 - 32 mmol/L 33  33  28  Calcium 8.9 - 10.3 mg/dL 10.1  9.8  10.6   Total Protein 6.5 - 8.1 g/dL 7.2  7.6  7.6   Total Bilirubin 0.3 - 1.2 mg/dL 0.4  0.4  0.4   Alkaline Phos 38 - 126 U/L 64  74  88   AST 15 - 41 U/L _0 ALT 0 - 44 U/L _1 Lipid Panel     Component Value Date/Time   CHOL 187 12/30/2021 0924   TRIG 137 12/30/2021 0924   HDL 69 12/30/2021 0924   CHOLHDL 2.7 12/30/2021 0924   LDLCALC 94 12/30/2021 0924   LDLDIRECT 84 12/30/2021 0927   LABVLDL 24 12/30/2021 0924    No components found for: "NTPROBNP" No results for input(s): "PROBNP" in the last 8760 hours. No results for input(s): "TSH" in the last 8760 hours.  BMP Recent Labs    12/30/21 0928 02/02/22 1212 07/07/22 0855  NA 141 140 140  K 4.3 3.9 4.5  CL 99 101 102  CO2 28 33* 33*  GLUCOSE 139* 202* 119*  BUN _2 CREATININE 0.63 0.64 0.59  CALCIUM 10.6* 9.8 10.1  GFRNONAA  --  >60 >60    HEMOGLOBIN A1C Lab Results  Component Value Date   HGBA1C 6.9 (H) 02/09/2022   MPG 151.33 02/09/2022    External Labs: Collected: 06/12/2019.  Provided by the referring physician. Sodium 143, potassium 4.7, chloride 103, bicarb 28, BUN 9, creatinine 0.6 AST 18, ALT 16, alkaline phosphatase 75 Hemoglobin 14.7 g/dL, hematocrit 44.8%.  External Labs: Collected: March 22, 2022 provided by PCP Total cholesterol 174, triglycerides 150, HDL 68, LDL 80, non-HDL 106  Hemoglobin 14 g/dL, hematocrit 41.6% BUN 11, creatinine 0.55. eGFR 95. Sodium 141, potassium 4.3, chloride 101, bicarb 32. AST 19, ALT 16, alkaline phosphatase 73 Hemoglobin A1c 7.1  External Labs: Collected: 09/22/2022 provided by the patient. Sodium 140, potassium 4.6, chloride 102, bicarb 33, BUN 9, creatinine 0.55 AST 19, ALT 17, alkaline phosphatase 70. Hemoglobin A1c  7.0. Total cholesterol 165, triglycerides 151, HDL 65, LDL 74, non-HDL 100  IMPRESSION:    ICD-10-CM   1. Carotid atherosclerosis, bilateral  I65.23 EKG 12-Lead    PCV CAROTID DUPLEX (BILATERAL)    2. Mixed hyperlipidemia  E78.2     3. Benign hypertension  I10     4. Type 2 diabetes mellitus without complication, without long-term current use of insulin ([facility])  E11.9         RECOMMENDATIONS: MADILYNE TADLOCK is a 76 y.o. female whose past medical history and cardiac risk factors include: Right Breast IDC (s/p surgery and radiation), bilateral carotid artery atherosclerosis, hypertension, COPD on home oxygen 2L [place], diabetes, hyperlipidemia, genital herpes, history of migraines, former smoker, postmenopausal female, advance age.   Carotid atherosclerosis, bilateral Asymptomatic. Most recent lipid profile provided by the patient -LDL within acceptable limits but not at goal. I recommended stricter dietary restrictions for now; however, if repeat lipids continue to trend upward or if she has progression of her carotid atherosclerosis we will uptitrate statin therapy sooner. Patient prefers to avoid up titration of statin therapy due to side effect profile. Check carotid duplex to reevaluate progression of carotid artery stenosis/atherosclerosis  Mixed hyperlipidemia Currently on rosuvastatin.   She denies myalgia or other side effects. Most recent lipids dated December 2023 reviewed as noted above. Recommend goal LDL <70 mg/dL Currently managed by primary care provider.  Benign  hypertension Office blood pressure is currently not at goal. Forgot to recheck the blood pressures prior to the end of the visit. However, patient stated that her BP at other providers offices have been within acceptable limits. I have asked her to check her blood pressures at home and to review her log with either myself or PCP to see if additional medication titration is warranted Reemphasized importance  of a low-salt diet  Type 2 diabetes mellitus without complication, without long-term current use of insulin ([place]) Most recent hemoglobin A1c 7.0. Currently on ARB, statin therapy.   Reemphasized importance of glycemic control.  Patient has noted intermittent episodes of palpitations and at times her blood pressure at home is noted irregular heart rate.  No diagnosis of either PVCs or A-fib.  For the overall frequency is less proceeding with a cardiac monitor may be a little yield.  I recommended considering Kardia mobile or smart watch technology that is FDA approved to record EKGs to monitor underlying rhythm.  However, if her frequency of palpitations or irregular rhythm increases she would greatly benefit from a cardiac monitor to evaluate for dysrhythmias.  Patient is agreeable with the plan of care and will let us know if her symptoms were to worsen.  FINAL MEDICATION LIST END OF ENCOUNTER: No orders of the defined types were placed in this encounter.    There are no discontinued medications.    Current Outpatient Medications:    acetaminophen (TYLENOL) 325 MG tablet, Take 650 mg by mouth every 6 (six) hours as needed for moderate pain., Disp: , Rfl:    albuterol (PROVENTIL) (2.5 MG/3ML) 0.083% nebulizer solution, Take 3 mLs (2.5 mg total) by nebulization every 6 (six) hours as needed for wheezing or shortness of breath. (Patient taking differently: Take 2.5 mg by nebulization daily.), Disp: 360 mL, Rfl: 12   albuterol (VENTOLIN HFA) 108 (90 Base) MCG/ACT inhaler, Inhale 2 puffs into the lungs every 6 (six) hours as needed for wheezing or shortness of breath., Disp: 3 each, Rfl: 3   aspirin EC 81 MG EC tablet, Take 81 mg by mouth daily., Disp: , Rfl:    cetirizine (ZYRTEC) 10 MG tablet, Take 10 mg by mouth daily., Disp: , Rfl:    Cholecalciferol (VITAMIN D3) 1000 UNITS CAPS, Take 1,000 Units by mouth daily., Disp: , Rfl:    COVID-19 mRNA bivalent vaccine, Pfizer, (PFIZER COVID-19 VAC  BIVALENT) injection, Inject into the muscle., Disp: 0.3 mL, Rfl: 0   Cranberry 500 MG TABS, Take 500 mg by mouth daily., Disp: , Rfl:    denosumab (PROLIA) 60 MG/ML SOSY injection, Inject 60 mg into the skin every 6 (six) months., Disp: , Rfl:    fluticasone (FLONASE) 50 MCG/ACT nasal spray, Place 1 spray into both nostrils daily., Disp: , Rfl:    Fluticasone-Umeclidin-Vilant (TRELEGY ELLIPTA) 100-62.5-25 MCG/ACT AEPB, Inhale 1 puff into the lungs daily., Disp: 60 each, Rfl: 3   ibuprofen (ADVIL) 200 MG tablet, Take 200 mg by mouth every 8 (eight) hours as needed for moderate pain., Disp: , Rfl:    IGLUCOSE TEST STRIPS test strip, See admin instructions., Disp: , Rfl:    L-Lysine 500 MG CAPS, Take 500 mg by mouth daily., Disp: , Rfl:    Lancets (STERILANCE TL) MISC, See admin instructions., Disp: , Rfl:    losartan (COZAAR) 50 MG tablet, TAKE 1 TABLET(50 MG) BY MOUTH TWICE DAILY, Disp: 180 tablet, Rfl: 0   Melatonin 5 MG CAPS, Take 3 mg by mouth daily  as needed (sleep)., Disp: , Rfl:    metFORMIN (GLUCOPHAGE-XR) 500 MG 24 hr tablet, Take 1,000 mg by mouth daily after supper., Disp: , Rfl:    Multiple Vitamins-Minerals (CENTRUM SILVER PO), Take 1 tablet by mouth daily.  , Disp: , Rfl:    omeprazole (PRILOSEC) 20 MG capsule, Take 20 mg by mouth every other day. , Disp: , Rfl:    OXYGEN, Inhale 2 L into the lungs daily as needed (SOB)., Disp: , Rfl:    polyethylene glycol (MIRALAX / GLYCOLAX) 17 g packet, Take 17 g by mouth every other day., Disp: , Rfl:    rosuvastatin (CRESTOR) 10 MG tablet, Take 1 tablet (10 mg total) by mouth at bedtime., Disp: 90 tablet, Rfl: 0   RSV vaccine recomb adjuvanted (AREXVY) 120 MCG/0.5ML injection, Inject into the muscle., Disp: 1 each, Rfl: 0   traMADol (ULTRAM) 50 MG tablet, Take 1 tablet (50 mg total) by mouth every 6 (six) hours as needed., Disp: 20 tablet, Rfl: 0   valACYclovir (VALTREX) 500 MG tablet, Take 500 mg by mouth 2 (two) times daily as needed  (outbreaks). For 3 days, Disp: , Rfl:   Orders Placed This Encounter  Procedures   EKG 12-Lead   PCV CAROTID DUPLEX (BILATERAL)   Patient Instructions  Consider [place] to check your rhythm.    --Continue cardiac medications as reconciled in final medication list. --Return in about 1 year (around 09/28/2023) for Follow up, Carotid disease. Or sooner if needed. --Continue follow-up with your primary care physician regarding the management of your other chronic comorbid conditions.  Patient's questions and concerns were addressed to her satisfaction. She voices understanding of the instructions provided during this encounter.   This note was created using a voice recognition software as a result there may be grammatical errors inadvertently enclosed that do not reflect the nature of this encounter. Every attempt is made to correct such errors.  Rex Kras, [place], [facility]  Pager: [phone] Office: [phone]

## 2022-10-03 ENCOUNTER — Ambulatory Visit: Payer: Medicare Other

## 2022-10-03 DIAGNOSIS — I6523 Occlusion and stenosis of bilateral carotid arteries: Secondary | ICD-10-CM

## 2022-10-12 NOTE — Progress Notes (Signed)
Lvm for pt to call back. 

## 2022-10-13 ENCOUNTER — Other Ambulatory Visit: Payer: Self-pay

## 2022-10-13 MED ORDER — ROSUVASTATIN CALCIUM 10 MG PO TABS: 10.0000 mg | ORAL_TABLET | Freq: Every evening | ORAL | 0 refills | Status: AC

## 2022-10-13 NOTE — Progress Notes (Signed)
Spoke with patient about results. She acknowledged information given, and had no further questions.

## 2022-11-07 DIAGNOSIS — M810 Age-related osteoporosis without current pathological fracture: Secondary | ICD-10-CM | POA: Diagnosis not present

## 2022-11-17 ENCOUNTER — Other Ambulatory Visit: Payer: Self-pay | Admitting: Gastroenterology

## 2022-12-02 ENCOUNTER — Encounter: Payer: Self-pay | Admitting: Cardiology

## 2022-12-02 NOTE — Telephone Encounter (Signed)
From patient

## 2022-12-02 NOTE — Telephone Encounter (Signed)
Gave patient this information. She acknowledged and agreed.

## 2022-12-02 NOTE — Telephone Encounter (Signed)
She can hold Aspirin 29m po qday.  Hold it 7 days before the procedure and can restart it as advised by her provider doing the procedures.   Dr. TTerri Skains

## 2022-12-07 DIAGNOSIS — R6883 Chills (without fever): Secondary | ICD-10-CM | POA: Diagnosis not present

## 2022-12-07 DIAGNOSIS — K08409 Partial loss of teeth, unspecified cause, unspecified class: Secondary | ICD-10-CM | POA: Diagnosis not present

## 2022-12-12 ENCOUNTER — Ambulatory Visit: Payer: Medicare Other | Attending: Hematology and Oncology

## 2022-12-12 VITALS — Wt 148.4 lb

## 2022-12-12 DIAGNOSIS — Z483 Aftercare following surgery for neoplasm: Secondary | ICD-10-CM | POA: Insufficient documentation

## 2022-12-12 NOTE — Therapy (Signed)
OUTPATIENT PHYSICAL THERAPY SOZO SCREENING NOTE   Patient Name: Yolanda Morris MRN: [id number] DOB:[birth year], 77 y.o., female Today's Date: 12/12/2022  PCP: [name], MD REFERRING PROVIDER: [name], MD   PT End of Session - 12/12/22 1058     Visit Number 13   # unchanged due to screen only   PT Start Time 42    PT Stop Time 1101    PT Time Calculation (min) 5 min    Activity Tolerance Patient tolerated treatment well    Behavior During Therapy WFL for tasks assessed/performed             Past Medical History:  Diagnosis Date   Cancer ([place])    skin   COPD (chronic obstructive pulmonary disease) ([place])    DM (diabetes mellitus) ([place])    Dyspnea    increased activity    Family history of ovarian cancer 02/03/2022   Family history of stomach cancer 02/03/2022   Genital herpes    GERD (gastroesophageal reflux disease)    Hyperlipidemia    Migraines    Osteoporosis    Unspecified essential hypertension    Past Surgical History:  Procedure Laterality Date   BREAST BIOPSY     BREAST EXCISIONAL BIOPSY     over 20 years ago- benign   BREAST LUMPECTOMY WITH RADIOACTIVE SEED AND SENTINEL LYMPH NODE BIOPSY Right 02/16/2022   Procedure: RIGHT BREAST LUMPECTOMY WITH RADIOACTIVE SEED AND SENTINEL LYMPH NODE BIOPSY;  Surgeon: [name], MD;  Location: [facility];  Service: General;  Laterality: Right;   BUNIONECTOMY Left 2004   and hammer toe repair Left foot   COLON SURGERY     removal of polyps   [place]     Patient Active Problem List   Diagnosis Date Noted   Genetic testing 02/09/2022   Family history of ovarian cancer 02/03/2022   Family history of stomach cancer 02/03/2022   Malignant neoplasm of upper-outer quadrant of right breast in female, estrogen receptor positive ([place]) 01/28/2022   Medication management 07/21/2020   Healthcare maintenance 07/21/2020   Body aches 07/22/2019   Chronic respiratory  failure with hypoxia ([place]) 06/17/2019   Allergic rhinitis 11/13/2018   Shortness of breath 10/23/2018   COPD exacerbation ([place]) 01/07/2015   Cough 07/05/2013   Acute sinusitis 02/12/2013   Acute bronchitis 07/08/2011   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) 01/05/2011    REFERRING DIAG: right breast cancer at risk for lymphedema  THERAPY DIAG: Aftercare following surgery for neoplasm  PERTINENT HISTORY: Patient was diagnosed on 12/27/2021 with right grade 2 invasive ductal carcinoma breast cancer. It measures 8 mm and is located in the upper outer quadrant. It is weakly ER positive, PR negative, and HER2 negative with a Ki67 of 20%. She has stage 4 COPD and is on 2 liters of oxygen. Post Rt breast lumpectomy with no carcinoma in 4 removed nodes on 02/16/22. Will be having radiation.   PRECAUTIONS: right UE Lymphedema risk, None  SUBJECTIVE: Pt returns for her 3 month L-Dex screen.   PAIN:  Are you having pain? No  SOZO SCREENING: Patient was assessed today using the SOZO machine to determine the lymphedema index score. This was compared to her baseline score. It was determined that she is within the recommended range when compared to her baseline and no further action is needed at this time. She will continue SOZO screenings. These are done every 3 months for  2 years post operatively followed by every 6 months for 2 years, and then annually.   L-DEX FLOWSHEETS - 12/12/22 1000       L-DEX LYMPHEDEMA SCREENING   Measurement Type Unilateral    L-DEX MEASUREMENT EXTREMITY Upper Extremity    POSITION  Standing    DOMINANT SIDE Right    At Risk Side Right    BASELINE SCORE (UNILATERAL) -9    L-DEX SCORE (UNILATERAL) -12.6    VALUE CHANGE (UNILAT) -3.6               Otelia Limes, PTA 12/12/2022, 11:00 AM

## 2022-12-14 ENCOUNTER — Encounter (HOSPITAL_COMMUNITY): Payer: Self-pay | Admitting: Gastroenterology

## 2022-12-14 NOTE — Progress Notes (Signed)
Attempted to obtain medical history via telephone, unable to reach at this time. HIPAA compliant voicemail message left requesting return call to pre surgical testing department. 

## 2022-12-19 NOTE — H&P (Signed)
History of Present Illness General:        77 year old female, history of hypertension, diabetes, COPD, presenting for consultation colon cancer screening.        Last seen 06/30/2021        Last colonoscopy 05/2018: 4 6 to 9 mm polyps in transverse and ascending colon (3 tubular adenomas, one sessile serrated polyp). Repeat 3 years.        She is on oxygen at nighttime for COPD, does not need oxygen throughout the day.        patient states she cancelled her colonoscopy last time due to health issues.  She is doing better.        she saw cardiologist and got full workup and is ready for colonoscopy.        no issues today.  Current MedicationsTakingCalcium 600 MG Tablet 1 tablet with meals Orally Twice a day , Notes to Pharmacist: every other daymetFORMIN HCl ER 500 MG Tablet Extended Release 24 Hour 2 tablets by mouth once a day with evening mealProlia(Denosumab) 60 MG/ML Solution Prefilled Syringe 60 mg Subcutaneous every 6 monthsTrelegy Ellipta(Fluticasone-Umeclidin-Vilant) 100-62.5-25 MCG/INH Aerosol Powder Breath Activated 1 puff Inhalation Once a day , Notes to Pharmacist: sampleFreeStyle Freedom Lite(Blood Glucose Monitoring Suppl) w/Device Kit as directed finger stick as directedFreeStyle Lancets(Lancets) - Miscellaneous as directed finger stick once a dayFreeStyle Lite Test(Glucose Blood) - Strip as directed In Vitro once a day in the morning before breakfastLosartan Potassium 50 MG Tablet TAKE 1 TABLET BY MOUTH Orally twice a dayvalACYclovir HCl 500 MG Tablet TAKE 1 TABLET BY MOUTH EVERY 12 HOURS FOR 3 DAYS AS NEEDED FOR OUTBREAKRosuvastatin Calcium 10 MG Tablet 1 tablet Orally Once a dayFlonase Allergy Relief(Fluticasone Propionate) 50 MCG/ACT Suspension 1 spray in each nostril Nasally Once a daySteriLance TL(Lancets) - Miscellaneous USE AS DIRECTEDAspir-Low 81 MG Tablet Delayed Release 1 tablet Orally once a dayCetirizine HCl 10 MG Tablet 1 tablet Orally Once a dayCranberry 500 MG Capsule as  directed Orally once a dayLysine 500 MG Tablet as directed Orally once a dayNebulizerOmeprazole 20 MG Capsule Delayed Release 1 capsule Orally every other day , Notes to Pharmacist: every other dayOxygen 1-6 liters as directed , Notes to Pharmacist: 2 litersVentolin HFA(Albuterol Sulfate HFA) 108 (90 Base) MCG/ACT Aerosol Solution 1 puff as needed Inhalation every 4 hrsVitamin D 25 MCG (1000 UT) Tablet 1 tablet Orally Once a dayWomens 50+ Multi Vitamin/Min - Tablet as directed Orally once a dayMelatonin 5 MG Capsule 1 capsule at bedtime as needed Orally Once a day , Notes to Pharmacist: '3mg'$  not '5mg'$ Acetaminophen 325 MG Capsule 2 tablet as needed Orally every 6 hrsMiraLax(Polyethylene Glycol 3350) 17 GM Packet 1 packet mixed with 8 ounces of fluid Orally every 3 daysCentrum Silver 50+Women(Multiple Vitamins-Minerals) - Tablet as directed OrallyIbuprofen 200 MG Tablet 1 tablet with food or milk as needed Orally Three times a dayTramadol 50 mg capsules 1-2 capsules by mouth - MUST BE PREPARED FREE OF ALL MAMMALIAN PRODUCTS every 6 hrs, as needed for painMedication List reviewed and reconciled with the patientPast Medical History     hypertension/ [facility].     COPD-Dr. Gwenette Greet now Dr. Lake Bells Dr. Shearon Stalls .     DM dx 2010/ [facility].     Hyperlipidemia.     Allergies.     osteoporosis with fragility vertebral compression fracture of T7 Dr. Buddy Duty.     Genital herpes, valtrex as needed.     h/o migraines/tension HA.     colonoscopy, 04/12/2012, normal repeat 5  years due to history of polyps, Dr. Amedeo Plenty.     12/30/2014 eye exam, no retinopathy [facility] [place].     12/31/2015 no retinopathy seen on eye exam Dr. Sharen Counter. .     eye exam,/17/2018, no retinopathy, Dr. Sharen Counter.     colonoscopy 05/28/2018 3 tubular adenoma and one serrated sessile adenoma repeat 3 years Dr. Therisa Doyne.     Inner fluid.     2023, march right breast cancer Dr. Chryl Heck.     endocrinologist Dr. Buddy Duty (osteoporosis).     cardiologist Dr. Terri Skains.Surgical History       breast lump removed, benign      dental work Shirlean Mylar of teeth      retinal tear, laser surgery      Colonoscopy 04-12-12      colonoscopy 05/28/18      breast biospy Dr. Ninfa Linden 01/2022      lumpectomy and lymph node biospsy Dr. Ninfa Linden 02/16/2022      I had outpatient surgery for removal of breast cancer May 3rd, 2023Family HistoryFather: deceased, alcoholic, hx not well knownMother: deceased 45 yrs, COPD/smoker, osteoporosis with fragility vertebral compression fracturePaternal Grand Father: unknownPaternal Grand Mother: unknownMaternal Grand Father: deceased, unknown hxMaternal Grand Mother: deceased, unknown hxBrother 1: deceased, complication of alcoholismSister 1: deceased 76 yrs, complication of alcoholismSister 2: alive, CAD /MI, COPD lives in [place] brother(s) , 2 sister(s) .no children, neg GI family history husband passed 2018.Social HistoryGeneral: Tobacco use  cigarettes:  Former smoker, Quit in year  1998, Pack-year Hx:  55, Tobacco history last updated  09/15/2022, Vaping  No. Alcohol: no. Caffeine: coffee 3 kcups per day. DIET: regular. Exercise: treadmill limited by her lung disease. DENTAL CARE: Yearly visits. Marital Status: Married, 1996 Delton widowed Oct 14, 2017. Children: stepchildren x 3 grandchildren x 5 and two great grandchildren. EDUCATION: Some College. OCCUPATION: retired from working at [employer] living July 2012. COMMUNICATION BARRIERS: no hearing, vision or cognition issues on oxygen new. Religion: [place]. Tobacco Exposure: mother smoked, and at work. Smoking: no. Quit smoking: no.AllergiesBacitracin: rash/ scarring - AllergyLevaquin: rash/itching - AllergyTelithromycin: stomach upset - Side EffectsHospitalization/Major Diagnostic Procedurenot in the past year 11/2023Review of SystemsGI PROCEDURE:        Pacemaker/ AICD no.  Artificial heart valves no.  MI/heart attack no.  Abnormal heart rhythm no.  Angina no.  CVA no.  Hypertension YES.  Hypotension no.   Asthma, COPD YES  COPD.  Sleep apnea no.  Seizure disorders no.  Artificial joints no.  Severe DJD no.  Diabetes YES, type II.  Significant headaches YES.  Vertigo YES.  Depression/anxiety no.  Abnormal bleeding YES, Taking blood thinners  81 mg.  Kidney Disease no.  Liver disease no.  Chance of pregnancy no.  Blood transfusion no  Vital Signs Wt: 151.2, Wt change: -2.8 lbs, Ht: 62, BMI: 27.65, Temp: 98.2, Pulse sitting: 79, BP sitting: 147/82. Examination Gastroenterology  Exam:        GENERAL APPEARANCE: Well developed, well nourished, no active distress, pleasant, no acute distress .  EYES: Lids and conjunctiva normal. Sclera normal, pupils equal and reactive.  RESPIRATORY Breath sounds normal. Respiration even and unlabored.  CARDIOVASCULAR Normal RRR w/o murmers or gallops. No peripheral edema.  SKIN Warm and dry.  PSYCHIATRIC Alert and oriented x3, mood and affect appear normal..    Assessments 1. History of colon polyps - Z86.010 (Primary)  Treatment1. History of colon polyps       IMAGING: Amie Critchley 09/15/2022  05:04:55 PM > ACTION made for hosp colon- pt aware it may be feb or march 2024- but we will give her a call with update.Clinical Notes: Patient is due for repeat screening colonoscopy. I thoroughly discussed the procedure with the patient including but not limited to nature, alternatives, benefits, and risks (including but not limited to bleeding, infection, perforation, anesthesia/cardiopulmonary complications). All questions were answered and the patient acknowledges these risks and wishes to proceed [facility]will [facility] since she has history of COPD and is on oxygen at night and carries home oxygen during day "just in case"

## 2022-12-20 ENCOUNTER — Ambulatory Visit (HOSPITAL_COMMUNITY): Payer: Medicare Other | Admitting: Certified Registered Nurse Anesthetist

## 2022-12-20 ENCOUNTER — Encounter (HOSPITAL_COMMUNITY): Payer: Self-pay | Admitting: Gastroenterology

## 2022-12-20 ENCOUNTER — Ambulatory Visit (HOSPITAL_BASED_OUTPATIENT_CLINIC_OR_DEPARTMENT_OTHER): Payer: Medicare Other | Admitting: Certified Registered Nurse Anesthetist

## 2022-12-20 ENCOUNTER — Encounter (HOSPITAL_COMMUNITY)
Admission: RE | Disposition: A | Discharge status: Home / Self Care | Payer: Self-pay | Source: Home / Self Care | Attending: Gastroenterology

## 2022-12-20 ENCOUNTER — Ambulatory Visit (HOSPITAL_COMMUNITY)
Admission: RE | Admit: 2022-12-20 | Discharge: 2022-12-20 | Disposition: A | Discharge status: Home / Self Care | Payer: Medicare Other | Attending: Gastroenterology | Admitting: Gastroenterology

## 2022-12-20 ENCOUNTER — Other Ambulatory Visit: Payer: Self-pay

## 2022-12-20 DIAGNOSIS — Z7984 Long term (current) use of oral hypoglycemic drugs: Secondary | ICD-10-CM | POA: Insufficient documentation

## 2022-12-20 DIAGNOSIS — K219 Gastro-esophageal reflux disease without esophagitis: Secondary | ICD-10-CM | POA: Insufficient documentation

## 2022-12-20 DIAGNOSIS — D123 Benign neoplasm of transverse colon: Secondary | ICD-10-CM | POA: Insufficient documentation

## 2022-12-20 DIAGNOSIS — E119 Type 2 diabetes mellitus without complications: Secondary | ICD-10-CM | POA: Diagnosis not present

## 2022-12-20 DIAGNOSIS — Z1211 Encounter for screening for malignant neoplasm of colon: Secondary | ICD-10-CM | POA: Insufficient documentation

## 2022-12-20 DIAGNOSIS — K648 Other hemorrhoids: Secondary | ICD-10-CM | POA: Diagnosis not present

## 2022-12-20 DIAGNOSIS — Z9981 Dependence on supplemental oxygen: Secondary | ICD-10-CM | POA: Insufficient documentation

## 2022-12-20 DIAGNOSIS — K5730 Diverticulosis of large intestine without perforation or abscess without bleeding: Secondary | ICD-10-CM | POA: Diagnosis not present

## 2022-12-20 DIAGNOSIS — Z86010 Personal history of colonic polyps: Secondary | ICD-10-CM

## 2022-12-20 DIAGNOSIS — I10 Essential (primary) hypertension: Secondary | ICD-10-CM

## 2022-12-20 DIAGNOSIS — J449 Chronic obstructive pulmonary disease, unspecified: Secondary | ICD-10-CM | POA: Insufficient documentation

## 2022-12-20 DIAGNOSIS — Z87891 Personal history of nicotine dependence: Secondary | ICD-10-CM

## 2022-12-20 HISTORY — PX: COLONOSCOPY WITH PROPOFOL: SHX5780

## 2022-12-20 HISTORY — PX: POLYPECTOMY: SHX5525

## 2022-12-20 LAB — GLUCOSE, CAPILLARY: Glucose-Capillary: 121 mg/dL — ABNORMAL HIGH (ref 70–99)

## 2022-12-20 SURGERY — COLONOSCOPY WITH PROPOFOL
Anesthesia: Monitor Anesthesia Care

## 2022-12-20 MED ORDER — PROPOFOL 500 MG/50ML IV EMUL
INTRAVENOUS | Status: DC | PRN
  Administered 2022-12-20: 100 ug/kg/min via INTRAVENOUS

## 2022-12-20 MED ORDER — PHENYLEPHRINE 80 MCG/ML (10ML) SYRINGE FOR IV PUSH (FOR BLOOD PRESSURE SUPPORT)
PREFILLED_SYRINGE | INTRAVENOUS | Status: DC | PRN
  Administered 2022-12-20: 80 ug via INTRAVENOUS

## 2022-12-20 MED ORDER — PROPOFOL 10 MG/ML IV BOLUS
INTRAVENOUS | Status: DC | PRN
  Administered 2022-12-20: 40 mg via INTRAVENOUS
  Administered 2022-12-20: 20 mg via INTRAVENOUS
  Administered 2022-12-20: 30 mg via INTRAVENOUS

## 2022-12-20 MED ORDER — PROPOFOL 1000 MG/100ML IV EMUL
INTRAVENOUS | Status: AC
  Filled 2022-12-20: qty 100

## 2022-12-20 MED ORDER — SODIUM CHLORIDE 0.9 % IV SOLN: INTRAVENOUS | Status: DC

## 2022-12-20 MED ORDER — LACTATED RINGERS IV SOLN: INTRAVENOUS | Status: DC

## 2022-12-20 SURGICAL SUPPLY — 22 items

## 2022-12-20 NOTE — Anesthesia Procedure Notes (Signed)
Procedure Name: MAC Date/Time: 12/20/2022 9:28 AM  Performed by: Deliah Boston, CRNAPre-anesthesia Checklist: Patient identified, Emergency Drugs available, Suction available and Patient being monitored Patient Re-evaluated:Patient Re-evaluated prior to induction Oxygen Delivery Method: Simple face mask Placement Confirmation: positive ETCO2 and breath sounds checked- equal and bilateral

## 2022-12-20 NOTE — Transfer of Care (Signed)
Immediate Anesthesia Transfer of Care Note  Patient: Yolanda Morris  Procedure(s) Performed: Procedure(s) with comments: COLONOSCOPY WITH PROPOFOL (N/A) POLYPECTOMY - polypectomy with cold forceps  Patient Location: PACU  Anesthesia Type:MAC  Level of Consciousness: Patient easily awoken, sedated, comfortable, cooperative, following commands, responds to stimulation.   Airway & Oxygen Therapy: Patient spontaneously breathing, ventilating well, oxygen via simple oxygen mask.  Post-op Assessment: Report given to PACU RN, vital signs reviewed and stable, moving all extremities.   Post vital signs: Reviewed and stable.  Complications: No apparent anesthesia complications Last Vitals:  Vitals Value Taken Time  BP 110/54 12/20/22 0954  Temp    Pulse 87 12/20/22 0956  Resp 18 12/20/22 0956  SpO2 100 % 12/20/22 0956  Vitals shown include unvalidated device data.  Last Pain:  Vitals:   12/20/22 0849  TempSrc: Temporal  PainSc: 0-No pain         Complications: No notable events documented.

## 2022-12-20 NOTE — Discharge Instructions (Signed)

## 2022-12-20 NOTE — Anesthesia Preprocedure Evaluation (Signed)
Anesthesia Evaluation  Patient identified by MRN, date of birth, ID band Patient awake    Reviewed: Allergy & Precautions, NPO status , Patient's Chart, lab work & pertinent test results  Airway Mallampati: III  TM Distance: >3 FB Neck ROM: Full    Dental   Pulmonary COPD,  COPD inhaler and oxygen dependent, former smoker    + decreased breath sounds      Cardiovascular hypertension, Pt. on medications  Rhythm:Regular Rate:Normal     Neuro/Psych  Headaches  negative psych ROS   GI/Hepatic ,GERD  Medicated,,  Endo/Other  diabetes, Type 2, Oral Hypoglycemic Agents    Renal/GU      Musculoskeletal   Abdominal   Peds  Hematology   Anesthesia Other Findings   Reproductive/Obstetrics                             Anesthesia Physical Anesthesia Plan  ASA: 3  Anesthesia Plan: MAC   Post-op Pain Management: Minimal or no pain anticipated   Induction: Intravenous  PONV Risk Score and Plan: 0 and Propofol infusion  Airway Management Planned: Natural Airway and Simple Face Mask  Additional Equipment: None  Intra-op Plan:   Post-operative Plan:   Informed Consent: I have reviewed the patients History and Physical, chart, labs and discussed the procedure including the risks, benefits and alternatives for the proposed anesthesia with the patient or authorized representative who has indicated his/her understanding and acceptance.       Plan Discussed with: CRNA  Anesthesia Plan Comments:        Anesthesia Quick Evaluation

## 2022-12-20 NOTE — Anesthesia Postprocedure Evaluation (Signed)
Anesthesia Post Note  Patient: Yolanda Morris  Procedure(s) Performed: COLONOSCOPY WITH PROPOFOL POLYPECTOMY     Patient location during evaluation: PACU Anesthesia Type: MAC Level of consciousness: awake and alert Pain management: pain level controlled Vital Signs Assessment: post-procedure vital signs reviewed and stable Respiratory status: spontaneous breathing, nonlabored ventilation, respiratory function stable and patient connected to nasal cannula oxygen Cardiovascular status: stable and blood pressure returned to baseline Postop Assessment: no apparent nausea or vomiting Anesthetic complications: no  No notable events documented.  Last Vitals:  Vitals:   12/20/22 1010 12/20/22 1020  BP: 129/68 (!) 151/77  Pulse: 85 74  Resp: 16 12  Temp:    SpO2: 96% 97%    Last Pain:  Vitals:   12/20/22 1020  TempSrc:   PainSc: 0-No pain                 Effie Berkshire

## 2022-12-20 NOTE — Op Note (Signed)
[facility] Patient Name: Yolanda Morris Procedure Date: 12/20/2022 MRN: [id number] Attending MD: Ronnette Juniper , MD, [id number] Date of Birth: April 19, 1946 CSN: [id number] Age: 77 Admit Type: Outpatient Procedure:                Colonoscopy Indications:              High risk colon cancer surveillance: Personal                            history of multiple (3 or more) adenomas, Last                            colonoscopy: 2019 Providers:                Ronnette Juniper, MD, Vladimir Crofts, RN, Darliss Cheney,                            Technician Referring MD:             Arliss Journey, MD Medicines:                See the Anesthesia note for documentation of the                            administered medications Complications:            No immediate complications. Estimated Blood Loss:     Estimated blood loss was minimal. Procedure:                Pre-Anesthesia Assessment:                           - Prior to the procedure, a History and Physical                            was performed, and patient medications and                            allergies were reviewed. The patient's tolerance of                            previous anesthesia was also reviewed. The risks                            and benefits of the procedure and the sedation                            options and risks were discussed with the patient.                            All questions were answered, and informed consent                            was obtained. Prior Anticoagulants: The patient has  taken no anticoagulant or antiplatelet agents                            except for aspirin. ASA Grade Assessment: III - A                            patient with severe systemic disease. After                            reviewing the risks and benefits, the patient was                            deemed in satisfactory condition to undergo the                            procedure.                            After obtaining informed consent, the colonoscope                            was passed under direct vision. Throughout the                            procedure, the patient's blood pressure, pulse, and                            oxygen saturations were monitored continuously. The                            [id number] Olympus colonoscope was                            introduced through the anus and advanced to the the                            terminal ileum. The colonoscopy was performed                            without difficulty. The patient tolerated the                            procedure well. The quality of the bowel                            preparation was good. The terminal ileum, ileocecal                            valve, appendiceal orifice, and rectum were                            photographed. Scope In: 9:32:55 AM Scope Out: [id number] AM Scope Withdrawal Time: 0 hours 10 minutes 50 seconds  Total Procedure Duration: 0 hours 16 minutes 22 seconds  Findings:  The perianal and digital rectal examinations were normal.      The terminal ileum appeared normal.      Three sessile polyps were found in the transverse colon. The polyps were       4 to 5 mm in size. These polyps were removed with a piecemeal technique       using a cold biopsy forceps. Resection and retrieval were complete.      Multiple medium-mouthed and small-mouthed diverticula were found in the       sigmoid colon and descending colon.      Non-bleeding internal hemorrhoids were found during retroflexion.      The exam was otherwise without abnormality. Impression:               - The examined portion of the ileum was normal.                           - Three 4 to 5 mm polyps in the transverse colon,                            removed piecemeal using a cold biopsy forceps.                            Resected and retrieved.                           - Diverticulosis in the  sigmoid colon and in the                            descending colon.                           - Non-bleeding internal hemorrhoids.                           - The examination was otherwise normal. Moderate Sedation:      Patient did not receive moderate sedation for this procedure, but       instead received monitored anesthesia care. Recommendation:           - Patient has a contact number available for                            emergencies. The signs and symptoms of potential                            delayed complications were discussed with the                            patient. Return to normal activities tomorrow.                            Written discharge instructions were provided to the                            patient.                           -  High fiber diet.                           - Continue present medications.                           - Await pathology results.                           - Repeat colonoscopy for surveillance based on                            pathology results. Procedure Code(s):        --- Professional ---                           [phone], Colonoscopy, flexible; with biopsy, single                            or multiple Diagnosis Code(s):        --- Professional ---                           Z86.010, Personal history of colonic polyps                           K64.8, Other hemorrhoids                           D12.3, Benign neoplasm of transverse colon (hepatic                            flexure or splenic flexure)                           K57.30, Diverticulosis of large intestine without                            perforation or abscess without bleeding CPT copyright 2022 American Medical Association. All rights reserved. The codes documented in this report are preliminary and upon coder review may  be revised to meet current compliance requirements. Ronnette Juniper, MD 12/20/2022 9:53:24 AM This report has been signed electronically. Number  of Addenda: 0

## 2022-12-21 ENCOUNTER — Telehealth: Payer: Self-pay | Admitting: Pulmonary Disease

## 2022-12-21 DIAGNOSIS — J439 Emphysema, unspecified: Secondary | ICD-10-CM

## 2022-12-21 LAB — SURGICAL PATHOLOGY

## 2022-12-21 NOTE — Telephone Encounter (Signed)
Pt wondering why Fluticasone-Umeclidin-Vilant (TRELEGY ELLIPTA) 100-62.5-25 MCG/ACT AEPB [id number]  was not accepted when Pharm sent over a request. Has been seen in the last year.   Also, she wanted to get more (kits) for her new nebulizer but the [facility] , [facility], needed a RX for these (Hoses/mouthpiece/etc.) Another Dr. sent and signed for these but there was an error on the RX and it had to be resent. Was that done?  Her # is [phone]

## 2022-12-22 MED ORDER — TRELEGY ELLIPTA 100-62.5-25 MCG/ACT IN AEPB: 1.0000 | INHALATION_SPRAY | Freq: Every day | RESPIRATORY_TRACT | 3 refills | Status: DC

## 2022-12-22 NOTE — Telephone Encounter (Signed)
Rx for Trelegy sent to pharmacy for pt and also sent nebulizer supply Rx to [facility]. Called and spoke with pt letting her know this had been done and she verbalized understanding. Nothing further needed.

## 2022-12-25 ENCOUNTER — Encounter (HOSPITAL_COMMUNITY): Payer: Self-pay | Admitting: Gastroenterology

## 2022-12-30 ENCOUNTER — Ambulatory Visit
Admission: RE | Admit: 2022-12-30 | Discharge: 2022-12-30 | Disposition: A | Discharge status: Home / Self Care | Payer: Medicare Other | Source: Ambulatory Visit | Attending: Adult Health | Admitting: Adult Health

## 2022-12-30 DIAGNOSIS — Z853 Personal history of malignant neoplasm of breast: Secondary | ICD-10-CM | POA: Diagnosis not present

## 2022-12-30 DIAGNOSIS — R922 Inconclusive mammogram: Secondary | ICD-10-CM | POA: Diagnosis not present

## 2022-12-30 DIAGNOSIS — Z170 Estrogen receptor positive status [ER+]: Secondary | ICD-10-CM

## 2023-01-04 DIAGNOSIS — E119 Type 2 diabetes mellitus without complications: Secondary | ICD-10-CM | POA: Diagnosis not present

## 2023-01-04 DIAGNOSIS — H2513 Age-related nuclear cataract, bilateral: Secondary | ICD-10-CM | POA: Diagnosis not present

## 2023-01-04 DIAGNOSIS — H33031 Retinal detachment with giant retinal tear, right eye: Secondary | ICD-10-CM | POA: Diagnosis not present

## 2023-01-04 DIAGNOSIS — H33021 Retinal detachment with multiple breaks, right eye: Secondary | ICD-10-CM | POA: Diagnosis not present

## 2023-01-05 ENCOUNTER — Encounter (HOSPITAL_COMMUNITY): Payer: Self-pay | Admitting: Ophthalmology

## 2023-01-05 ENCOUNTER — Inpatient Hospital Stay: Payer: Medicare Other | Attending: Hematology and Oncology | Admitting: Hematology and Oncology

## 2023-01-05 ENCOUNTER — Ambulatory Visit (HOSPITAL_COMMUNITY): Payer: Self-pay | Admitting: Ophthalmology

## 2023-01-05 ENCOUNTER — Other Ambulatory Visit: Payer: Self-pay

## 2023-01-05 VITALS — BP 161/84 | HR 100 | Temp 98.2°F | Resp 16 | Wt 149.1 lb

## 2023-01-05 DIAGNOSIS — H3321 Serous retinal detachment, right eye: Secondary | ICD-10-CM | POA: Insufficient documentation

## 2023-01-05 DIAGNOSIS — M810 Age-related osteoporosis without current pathological fracture: Secondary | ICD-10-CM | POA: Diagnosis not present

## 2023-01-05 DIAGNOSIS — Z8041 Family history of malignant neoplasm of ovary: Secondary | ICD-10-CM | POA: Diagnosis not present

## 2023-01-05 DIAGNOSIS — Z780 Asymptomatic menopausal state: Secondary | ICD-10-CM | POA: Diagnosis not present

## 2023-01-05 DIAGNOSIS — Z809 Family history of malignant neoplasm, unspecified: Secondary | ICD-10-CM | POA: Diagnosis not present

## 2023-01-05 DIAGNOSIS — H33021 Retinal detachment with multiple breaks, right eye: Secondary | ICD-10-CM

## 2023-01-05 DIAGNOSIS — Z800 Family history of malignant neoplasm of digestive organs: Secondary | ICD-10-CM | POA: Insufficient documentation

## 2023-01-05 DIAGNOSIS — H5461 Unqualified visual loss, right eye, normal vision left eye: Secondary | ICD-10-CM | POA: Diagnosis not present

## 2023-01-05 DIAGNOSIS — C50411 Malignant neoplasm of upper-outer quadrant of right female breast: Secondary | ICD-10-CM | POA: Diagnosis not present

## 2023-01-05 DIAGNOSIS — Z170 Estrogen receptor positive status [ER+]: Secondary | ICD-10-CM | POA: Diagnosis not present

## 2023-01-05 DIAGNOSIS — Z87891 Personal history of nicotine dependence: Secondary | ICD-10-CM | POA: Diagnosis not present

## 2023-01-05 NOTE — Progress Notes (Signed)
[facility] NOTE  Patient Care Team: [name], MD as PCP - General (Family Medicine) [name], MD as Consulting Physician (General Surgery) [name], MD as Consulting Physician (Hematology and Oncology) Eppie Gibson, MD as Attending Physician (Radiation Oncology) Delrae Rend, MD as Consulting Physician (Endocrinology) Rex Kras, DO as Consulting Physician (Cardiology) Spero Geralds, MD as Consulting Physician (Pulmonary Disease)  CHIEF COMPLAINTS/PURPOSE OF CONSULTATION:  Newly diagnosed breast cancer  HISTORY OF PRESENTING ILLNESS:  Yolanda Morris 77 y.o. female is here because of recent diagnosis of right breast IDC  I reviewed her records extensively and collaborated the history with the patient.  SUMMARY OF ONCOLOGIC HISTORY: Oncology History  Malignant neoplasm of upper-outer quadrant of right breast in female, estrogen receptor positive ([place])  12/27/2021 Mammogram   Screening mammogram showed a right breast asymmetry warranting further investigation. Diagnostic mammogram showed 6 x 7 x 8 mm irregular hypoechoic right breast mass at 10 o'clock position 5 cm from the nipple.  No right axillary adenopathy   01/24/2022 Pathology Results   Pathology results showed right breast invasive ductal carcinoma, grade 2.  Prognostic showed ER 30%, positive weak staining intensity.  PR 0%, negative.  Ki-67 of 20%.  Tumor cells are negative for HER2 1+    01/28/2022 Initial Diagnosis   Malignant neoplasm of upper-outer quadrant of right breast in female, estrogen receptor positive ([place])   02/02/2022 Cancer Staging   Staging form: Breast, AJCC 8th Edition - Pathologic: Stage IB (pT1b, pN0, cM0, G2, ER-, PR-, HER2-) - Signed by [name], MD on 03/01/2022 Histologic grading system: 3 grade system   02/08/2022 Genetic Testing   Negative hereditary cancer genetic testing: no pathogenic variants detected in [facility] BRCAPlus Panel and [facility]  CustomNext-Cancer +RNAinsight.  Report dates are February 08, 2022 and February 13, 2022.   The BRCAplus panel offered by [employer] and includes sequencing and deletion/duplication analysis for the following 8 genes: ATM, BRCA1, BRCA2, CDH1, CHEK2, PALB2, PTEN, and TP53.  The CustomNext-Cancer+RNAinsight panel offered by [facility] includes sequencing and rearrangement analysis for the following 47 genes:  APC, ATM, AXIN2, BARD1, BMPR1A, BRCA1, BRCA2, BRIP1, CDH1, CDK4, CDKN2A, CHEK2, DICER1, EPCAM, GREM1, HOXB13, MEN1, MLH1, MSH2, MSH3, MSH6, MUTYH, NBN, NF1, NF2, NTHL1, PALB2, PMS2, POLD1, POLE, PTEN, RAD51C, RAD51D, RECQL, RET, SDHA, SDHAF2, SDHB, SDHC, SDHD, SMAD4, SMARCA4, STK11, TP53, TSC1, TSC2, and VHL.  RNA data is routinely analyzed for use in variant interpretation for all genes.   02/16/2022 Surgery   Patient had right breast lumpectomy, deep sentinel lymph node biopsy on 02/16/2022.  Pathology showed invasive ductal carcinoma, 0.8 cm in maximal dimension, and 0.4 cm of the closest resection margin, DCIS coming to less than 0.1 cm of the closest resection margin.  No metastatic carcinoma in 4 out of 4 lymph nodes.  Prior prognostic showed ER 30% positive weak staining, PR 0% negative.  Repeat prognostics from final pathology showed ER negative, PR negative, KI of 15% and HER2 negative   03/31/2022 - 04/28/2022 Radiation Therapy   Site Technique Total Dose (Gy) Dose per Fx (Gy) Completed Fx Beam Energies  Breast, Right: Breast_R 3D 40.05/40.05 2.67 15/15 10XFFF  Breast, Right: Breast_R_Bst 3D 10/10 2 5/5 6X, 10X      Interval history  She is here for a follow up today. Since her last visit here, she had to go to the eye doctor urgently because she had some loss of vision in the right eye and was diagnosed to  have retinal detachment and she is having urgent surgery scheduled for tomorrow.  Other than that she is also dealing with some tooth infection with some dental surgeries.  She continues  on Prolia for osteoporosis with Dr. Buddy Duty.  She has informed Dr. Buddy Duty of the upcoming dental procedures.  She had a colonoscopy and a mammogram.  She is thankful for all the results.  Rest of the pertinent 10 point ROS reviewed and negative  MEDICAL HISTORY:  Past Medical History:  Diagnosis Date   Cancer ([place])    skin   COPD (chronic obstructive pulmonary disease) ([place])    DM (diabetes mellitus) ([place])    Dyspnea    increased activity    Family history of ovarian cancer 02/03/2022   Family history of stomach cancer 02/03/2022   Genital herpes    GERD (gastroesophageal reflux disease)    Hyperlipidemia    Migraines    Osteoporosis    Unspecified essential hypertension     SURGICAL HISTORY: Past Surgical History:  Procedure Laterality Date   BREAST BIOPSY     BREAST EXCISIONAL BIOPSY     over 20 years ago- benign   BREAST LUMPECTOMY WITH RADIOACTIVE SEED AND SENTINEL LYMPH NODE BIOPSY Right 02/16/2022   Procedure: RIGHT BREAST LUMPECTOMY WITH RADIOACTIVE SEED AND SENTINEL LYMPH NODE BIOPSY;  Surgeon: [name], MD;  Location: [facility];  Service: General;  Laterality: Right;   BUNIONECTOMY Left 2004   and hammer toe repair Left foot   COLON SURGERY     removal of polyps   COLONOSCOPY WITH PROPOFOL N/A 12/20/2022   Procedure: COLONOSCOPY WITH PROPOFOL;  Surgeon: Ronnette Juniper, MD;  Location: [facility];  Service: Gastroenterology;  Laterality: N/A;   DENTAL SURGERY     POLYPECTOMY  12/20/2022   Procedure: POLYPECTOMY;  Surgeon: Ronnette Juniper, MD;  Location: [facility];  Service: Gastroenterology;;  polypectomy with cold forceps   RETINAL TEAR REPAIR CRYOTHERAPY      SOCIAL HISTORY: Social History   Socioeconomic History   Marital status: Widowed    Spouse name: Not on file   Number of children: Not on file   Years of education: Not on file   Highest education level: Not on file  Occupational History   Occupation: Development worker, community  Tobacco Use   Smoking  status: Former    Packs/day: 2.00    Years: 25.00    Additional pack years: 0.00    Total pack years: 50.00    Types: Cigarettes    Quit date: 10/17/1996    Years since quitting: 26.2   Smokeless tobacco: Never  Vaping Use   Vaping Use: Never used  Substance and Sexual Activity   Alcohol use: No    Alcohol/week: 0.0 standard drinks of alcohol   Drug use: No   Sexual activity: Not Currently    Birth control/protection: Post-menopausal  Other Topics Concern   Not on file  Social History Narrative   No children   Social Determinants of Health   Financial Resource Strain: Not on file  Food Insecurity: Not on file  Transportation Needs: Not on file  Physical Activity: Not on file  Stress: Not on file  Social Connections: Not on file  Intimate Partner Violence: Not on file    FAMILY HISTORY: Family History  Problem Relation Age of Onset   COPD Mother        deceased   Heart disease Mother    Alcohol abuse Father    Alcohol abuse Sister  deceased   Heart attack Sister    Alcohol abuse Brother        deceased   Cancer Maternal Aunt        unknown type; d. 90   Cancer Maternal Uncle        unknown type; d. 69s   Ovarian cancer Paternal Aunt        d. 62   Stomach cancer Paternal Aunt 74   Breast cancer Neg Hx     ALLERGIES:  is allergic to telithromycin, bacitracin, and levofloxacin.  MEDICATIONS:  Current Outpatient Medications  Medication Sig Dispense Refill   acetaminophen (TYLENOL) 325 MG tablet Take 650 mg by mouth every 6 (six) hours as needed for moderate pain.     albuterol (PROVENTIL) (2.5 MG/3ML) 0.083% nebulizer solution Take 3 mLs (2.5 mg total) by nebulization every 6 (six) hours as needed for wheezing or shortness of breath. (Patient taking differently: Take 2.5 mg by nebulization daily.) 360 mL 12   albuterol (VENTOLIN HFA) 108 (90 Base) MCG/ACT inhaler Inhale 2 puffs into the lungs every 6 (six) hours as needed for wheezing or shortness of  breath. 3 each 3   aspirin EC 81 MG EC tablet Take 81 mg by mouth daily.     calcium carbonate (OSCAL) 1500 (600 Ca) MG TABS tablet Take 600 mg of elemental calcium by mouth every other day.     cetirizine (ZYRTEC) 10 MG tablet Take 10 mg by mouth daily.     Cholecalciferol (VITAMIN D3) 1000 UNITS CAPS Take 1,000 Units by mouth daily.     COVID-19 mRNA bivalent vaccine, Pfizer, (PFIZER COVID-19 VAC BIVALENT) injection Inject into the muscle. 0.3 mL 0   Cranberry 500 MG TABS Take 500 mg by mouth daily.     denosumab (PROLIA) 60 MG/ML SOSY injection Inject 60 mg into the skin every 6 (six) months.     fluticasone (FLONASE) 50 MCG/ACT nasal spray Place 1 spray into both nostrils daily.     Fluticasone-Umeclidin-Vilant (TRELEGY ELLIPTA) 100-62.5-25 MCG/ACT AEPB Inhale 1 puff into the lungs daily. 60 each 3   Homeopathic Products (VEGA ORAL CARE RINSE) SOLN Use as directed 10 mLs in the mouth or throat 3 (three) times daily.     hydrocortisone cream 1 % Apply 1 Application topically 2 (two) times daily as needed (rash).     IGLUCOSE TEST STRIPS test strip See admin instructions.     L-Lysine 500 MG CAPS Take 500 mg by mouth daily.     Lancets (STERILANCE TL) MISC See admin instructions.     losartan (COZAAR) 50 MG tablet TAKE 1 TABLET(50 MG) BY MOUTH TWICE DAILY 180 tablet 0   Melatonin 3 MG CAPS Take 3 mg by mouth at bedtime.     metFORMIN (GLUCOPHAGE-XR) 500 MG 24 hr tablet Take 1,000 mg by mouth daily after supper.     Multiple Vitamins-Minerals (CENTRUM SILVER PO) Take 1 tablet by mouth daily.       omeprazole (PRILOSEC) 20 MG capsule Take 20 mg by mouth every other day.      OXYGEN Inhale 2 L into the lungs daily as needed (SOB).     polyethylene glycol (MIRALAX / GLYCOLAX) 17 g packet Take 17 g by mouth daily as needed for moderate constipation.     rosuvastatin (CRESTOR) 10 MG tablet Take 1 tablet (10 mg total) by mouth at bedtime. 90 tablet 0   RSV vaccine recomb adjuvanted (AREXVY) 120  MCG/0.5ML injection Inject into the muscle.  1 each 0   tobramycin-dexamethasone (TOBRADEX) ophthalmic solution Place 1 drop into the right eye in the morning, at noon, and at bedtime.     traMADol (ULTRAM) 50 MG tablet Take 1 tablet (50 mg total) by mouth every 6 (six) hours as needed. 20 tablet 0   triamcinolone cream (KENALOG) 0.5 % Apply 1 Application topically daily as needed (cracked skin).     valACYclovir (VALTREX) 500 MG tablet Take 500 mg by mouth 2 (two) times daily as needed (outbreaks). For 3 days     No current facility-administered medications for this visit.    REVIEW OF SYSTEMS:   Constitutional: Denies fevers, chills or abnormal night sweats Eyes: Denies blurriness of vision, double vision or watery eyes Ears, nose, mouth, throat, and face: Denies mucositis or sore throat Respiratory: Denies cough, dyspnea or wheezes Cardiovascular: Denies palpitation, chest discomfort or lower extremity swelling Gastrointestinal:  Denies nausea, heartburn or change in bowel habits Skin: Denies abnormal skin rashes Lymphatics: Denies new lymphadenopathy or easy bruising Neurological:Denies numbness, tingling or new weaknesses Behavioral/Psych: Mood is stable, no new changes  Breast: Denies any palpable lumps or discharge All other systems were reviewed with the patient and are negative.  PHYSICAL EXAMINATION: ECOG PERFORMANCE STATUS: 1 - Symptomatic but completely ambulatory  Vitals:   01/05/23 0945  BP: (!) 161/84  Pulse: 100  Resp: 16  Temp: 98.2 F (36.8 C)  SpO2: 93%    Filed Weights   01/05/23 0945  Weight: 149 lb 1.6 oz (67.6 kg)   Physical Exam Constitutional:      Appearance: Normal appearance.  Chest:     Comments: Bilateral breasts inspected.  Scattered density noted.  Postsurgical changes.  Otherwise no palpable masses or regional adenopathy. Musculoskeletal:        General: Normal range of motion.     Cervical back: Normal range of motion and neck supple. No  rigidity.  Lymphadenopathy:     Cervical: No cervical adenopathy.  Neurological:     Mental Status: She is alert.      LABORATORY DATA:  I have reviewed the data as listed Lab Results  Component Value Date   WBC 6.7 07/07/2022   HGB 14.9 07/07/2022   HCT 43.4 07/07/2022   MCV 89.1 07/07/2022   PLT 249 07/07/2022   Lab Results  Component Value Date   NA 140 07/07/2022   K 4.5 07/07/2022   CL 102 07/07/2022   CO2 33 (H) 07/07/2022    RADIOGRAPHIC STUDIES: I have personally reviewed the radiological reports and agreed with the findings in the report.  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  Given the very small size of the tumor, we have recommended considering surgery upfront followed by consideration for adjuvant chemotherapy based on the final pathology.  She is now status post surgery, had right breast lumpectomy, final pathology showed 8 mm invasive ductal carcinoma, grade 2, triple negative, KI of 15% Given final pathology showing tumor of 8 mm, we have discussed about considering adjuvant chemotherapy.  We have discussed about docetaxel and cyclophosphamide every 21 days for 4 cycles.  She understands that the role of chemotherapy is to reduce the risk of distant metastasis as well  as local recurrence. She declined adjuvant chemo.  She completed adjuvant radiation and is now here for follow-up.  Last mammogram with no concerns for malignancy.  Physical exam today once again with no concerns for malignancy.  She will continue surveillance every 6 months.  She will continue follow-up with ophthalmology for her retinal detachment issues.  Most recent  colonoscopy results reviewed, tubular adenoma noted, no evidence of malignancy.  Encouraged her to discuss dental issues with Dr. Kittie Plater she is on Prolia.  She expressed understanding of all the recommendations.     [name], MD 01/05/23

## 2023-01-05 NOTE — Progress Notes (Signed)
Yolanda. Morris denies chest pain or shortness of breath.  Patient denies having any s/s of Covid in her household, also denies any known exposure to Covid. Yolanda Morris denies denies any s/s of upper or lower respiratory infections in the past 8 weeks.  Yolanda Morris has a history of COPD- she wears oxygen at 2l 24/7.  Patient's pulmonologist is Dr. Ralene Ok, patient states she only gets short of breath when she is walking.  Yolanda Morris has type II diabetes, patient states she rarely checks CBG, last time she checked it, it was 128.  I instructed patient to check CBG after awaking and every 2 hours until arrival  to the hospital. I Instructed Yolanda Morris if CBG is less than 70 to take 4 Glucose Tablets or 1 tube of Glucose Gel or 1/2 cup of a clear juice. Recheck CBG in 15 minutes if CBG is not over 70 call, pre- op desk at [phone] for further instructions.

## 2023-01-05 NOTE — Assessment & Plan Note (Signed)
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  Given the very small size of the tumor, we have recommended considering surgery upfront followed by consideration for adjuvant chemotherapy based on the final pathology.  She is now status post surgery, had right breast lumpectomy, final pathology showed 8 mm invasive ductal carcinoma, grade 2, triple negative, KI of 15% Given final pathology showing tumor of 8 mm, we have discussed about considering adjuvant chemotherapy.  We have discussed about docetaxel and cyclophosphamide every 21 days for 4 cycles.  She understands that the role of chemotherapy is to reduce the risk of distant metastasis as well as local recurrence. She declined adjuvant chemo.  She completed adjuvant radiation and is now here for follow-up.  Last mammogram with no concerns for malignancy.  Physical exam today once again with no concerns for malignancy.  She will continue surveillance every 6 months.  She will continue follow-up with ophthalmology for her retinal detachment issues.  Most recent colonoscopy results reviewed, tubular adenoma noted, no evidence of malignancy.  Encouraged her to discuss dental issues with Dr. Kittie Plater she is on Prolia.  She expressed understanding of all the recommendations.

## 2023-01-06 ENCOUNTER — Encounter (HOSPITAL_COMMUNITY)
Admission: RE | Disposition: A | Discharge status: Home / Self Care | Payer: Self-pay | Source: Home / Self Care | Attending: Ophthalmology

## 2023-01-06 ENCOUNTER — Other Ambulatory Visit: Payer: Self-pay

## 2023-01-06 ENCOUNTER — Ambulatory Visit (HOSPITAL_COMMUNITY)
Admission: RE | Admit: 2023-01-06 | Discharge: 2023-01-06 | Disposition: A | Discharge status: Home / Self Care | Payer: Medicare Other | Attending: Ophthalmology | Admitting: Ophthalmology

## 2023-01-06 ENCOUNTER — Encounter (HOSPITAL_COMMUNITY): Payer: Self-pay | Admitting: Ophthalmology

## 2023-01-06 ENCOUNTER — Ambulatory Visit (HOSPITAL_BASED_OUTPATIENT_CLINIC_OR_DEPARTMENT_OTHER): Payer: Medicare Other | Admitting: Anesthesiology

## 2023-01-06 ENCOUNTER — Ambulatory Visit (HOSPITAL_COMMUNITY): Payer: Medicare Other | Admitting: Anesthesiology

## 2023-01-06 DIAGNOSIS — J449 Chronic obstructive pulmonary disease, unspecified: Secondary | ICD-10-CM | POA: Diagnosis not present

## 2023-01-06 DIAGNOSIS — H33021 Retinal detachment with multiple breaks, right eye: Secondary | ICD-10-CM

## 2023-01-06 DIAGNOSIS — H33001 Unspecified retinal detachment with retinal break, right eye: Secondary | ICD-10-CM | POA: Diagnosis not present

## 2023-01-06 DIAGNOSIS — Z7951 Long term (current) use of inhaled steroids: Secondary | ICD-10-CM | POA: Diagnosis not present

## 2023-01-06 DIAGNOSIS — H269 Unspecified cataract: Secondary | ICD-10-CM | POA: Insufficient documentation

## 2023-01-06 DIAGNOSIS — H3321 Serous retinal detachment, right eye: Secondary | ICD-10-CM

## 2023-01-06 DIAGNOSIS — E1136 Type 2 diabetes mellitus with diabetic cataract: Secondary | ICD-10-CM | POA: Insufficient documentation

## 2023-01-06 DIAGNOSIS — I10 Essential (primary) hypertension: Secondary | ICD-10-CM

## 2023-01-06 DIAGNOSIS — Z01818 Encounter for other preprocedural examination: Secondary | ICD-10-CM

## 2023-01-06 DIAGNOSIS — Z9981 Dependence on supplemental oxygen: Secondary | ICD-10-CM | POA: Diagnosis not present

## 2023-01-06 DIAGNOSIS — E119 Type 2 diabetes mellitus without complications: Secondary | ICD-10-CM

## 2023-01-06 DIAGNOSIS — Z7984 Long term (current) use of oral hypoglycemic drugs: Secondary | ICD-10-CM | POA: Insufficient documentation

## 2023-01-06 DIAGNOSIS — K219 Gastro-esophageal reflux disease without esophagitis: Secondary | ICD-10-CM | POA: Insufficient documentation

## 2023-01-06 DIAGNOSIS — Z87891 Personal history of nicotine dependence: Secondary | ICD-10-CM

## 2023-01-06 HISTORY — DX: Pneumonia, unspecified organism: J18.9

## 2023-01-06 HISTORY — PX: GAS INSERTION: SHX5336

## 2023-01-06 HISTORY — DX: Family history of other specified conditions: Z84.89

## 2023-01-06 HISTORY — PX: SCLERAL BUCKLE WITH CRYO: SHX5341

## 2023-01-06 LAB — GLUCOSE, CAPILLARY
Glucose-Capillary: 130 mg/dL — ABNORMAL HIGH (ref 70–99)
Glucose-Capillary: 126 mg/dL — ABNORMAL HIGH (ref 70–99)
Glucose-Capillary: 138 mg/dL — ABNORMAL HIGH (ref 70–99)

## 2023-01-06 LAB — CBC
HCT: 42.9 % (ref 36.0–46.0)
Hemoglobin: 14.6 g/dL (ref 12.0–15.0)
MCH: 30.7 pg (ref 26.0–34.0)
MCHC: 34 g/dL (ref 30.0–36.0)
MCV: 90.1 fL (ref 80.0–100.0)
Platelets: 265 10*3/uL (ref 150–400)
RBC: 4.76 MIL/uL (ref 3.87–5.11)
RDW: 12.7 % (ref 11.5–15.5)
WBC: 8.9 10*3/uL (ref 4.0–10.5)
nRBC: 0 % (ref 0.0–0.2)

## 2023-01-06 LAB — BASIC METABOLIC PANEL
Anion gap: 14 (ref 5–15)
BUN: 8 mg/dL (ref 8–23)
CO2: 23 mmol/L (ref 22–32)
Calcium: 9.8 mg/dL (ref 8.9–10.3)
Chloride: 100 mmol/L (ref 98–111)
Creatinine, Ser: 0.52 mg/dL (ref 0.44–1.00)
GFR, Estimated: >60 mL/min (ref >60)
Glucose, Bld: 117 mg/dL — ABNORMAL HIGH (ref 70–99)
Potassium: 4 mmol/L (ref 3.5–5.1)
Sodium: 137 mmol/L (ref 135–145)

## 2023-01-06 SURGERY — SCLERAL BUCKLE WITH CRYO
Anesthesia: General | Site: Eye | Laterality: Right

## 2023-01-06 MED ORDER — SODIUM CHLORIDE (PF) 0.9 % IJ SOLN
INTRAMUSCULAR | Status: DC | PRN
  Administered 2023-01-06: 12 mL

## 2023-01-06 MED ORDER — ROPIVACAINE HCL 5 MG/ML IJ SOLN
INTRAMUSCULAR | Status: DC | PRN
  Administered 2023-01-06: 2 mL

## 2023-01-06 MED ORDER — LIDOCAINE 2% (20 MG/ML) 5 ML SYRINGE
INTRAMUSCULAR | Status: DC | PRN
  Administered 2023-01-06: 60 mg via INTRAVENOUS

## 2023-01-06 MED ORDER — DEXAMETHASONE SODIUM PHOSPHATE 10 MG/ML IJ SOLN
INTRAMUSCULAR | Status: AC
  Filled 2023-01-06: qty 1

## 2023-01-06 MED ORDER — ORAL CARE MOUTH RINSE: 15.0000 mL | Freq: Once | OROMUCOSAL | Status: AC

## 2023-01-06 MED ORDER — PHENYLEPHRINE 80 MCG/ML (10ML) SYRINGE FOR IV PUSH (FOR BLOOD PRESSURE SUPPORT)
PREFILLED_SYRINGE | INTRAVENOUS | Status: DC | PRN
  Administered 2023-01-06: 160 ug via INTRAVENOUS
  Administered 2023-01-06 (×3): 80 ug via INTRAVENOUS

## 2023-01-06 MED ORDER — DEXAMETHASONE SODIUM PHOSPHATE 10 MG/ML IJ SOLN
INTRAMUSCULAR | Status: DC | PRN
  Administered 2023-01-06: 5 mg via INTRAVENOUS

## 2023-01-06 MED ORDER — LIDOCAINE HCL 2 % IJ SOLN
INTRAMUSCULAR | Status: AC
  Filled 2023-01-06: qty 20

## 2023-01-06 MED ORDER — POLYMYXIN B SULFATE 500000 UNITS IJ SOLR
INTRAMUSCULAR | Status: AC
  Filled 2023-01-06: qty 10

## 2023-01-06 MED ORDER — SODIUM CHLORIDE (PF) 0.9 % IJ SOLN
INTRAMUSCULAR | Status: AC
  Filled 2023-01-06: qty 10

## 2023-01-06 MED ORDER — ROCURONIUM BROMIDE 10 MG/ML (PF) SYRINGE
PREFILLED_SYRINGE | INTRAVENOUS | Status: DC | PRN
  Administered 2023-01-06: 30 mg via INTRAVENOUS
  Administered 2023-01-06: 50 mg via INTRAVENOUS

## 2023-01-06 MED ORDER — SODIUM HYALURONATE 10 MG/ML IO SOLUTION
PREFILLED_SYRINGE | INTRAOCULAR | Status: DC | PRN
  Administered 2023-01-06: .85 mL via INTRAOCULAR

## 2023-01-06 MED ORDER — ACETAMINOPHEN 325 MG PO TABS
ORAL_TABLET | ORAL | Status: AC
  Administered 2023-01-06: 650 mg via ORAL
  Filled 2023-01-06: qty 2

## 2023-01-06 MED ORDER — NA CHONDROIT SULF-NA HYALURON 40-30 MG/ML IO SOSY
INTRAOCULAR | Status: AC
  Filled 2023-01-06: qty 0.5

## 2023-01-06 MED ORDER — TETRACAINE HCL 0.5 % OP SOLN
OPHTHALMIC | Status: AC
  Filled 2023-01-06: qty 4

## 2023-01-06 MED ORDER — SODIUM HYALURONATE 10 MG/ML IO SOLUTION
PREFILLED_SYRINGE | INTRAOCULAR | Status: AC
  Filled 2023-01-06: qty 0.85

## 2023-01-06 MED ORDER — EPINEPHRINE PF 1 MG/ML IJ SOLN
INTRAMUSCULAR | Status: AC
  Filled 2023-01-06: qty 1

## 2023-01-06 MED ORDER — CEFAZOLIN SODIUM-DEXTROSE 2-3 GM-%(50ML) IV SOLR
INTRAVENOUS | Status: DC | PRN
  Administered 2023-01-06: 1 g via INTRAVENOUS

## 2023-01-06 MED ORDER — BSS IO SOLN
INTRAOCULAR | Status: AC
  Filled 2023-01-06: qty 15

## 2023-01-06 MED ORDER — CHLORHEXIDINE GLUCONATE 0.12 % MT SOLN
15.0000 mL | Freq: Once | OROMUCOSAL | Status: AC
  Administered 2023-01-06: 15 mL via OROMUCOSAL
  Filled 2023-01-06: qty 15

## 2023-01-06 MED ORDER — GENTAMICIN SULFATE 40 MG/ML IJ SOLN
INTRAMUSCULAR | Status: AC
  Filled 2023-01-06: qty 2

## 2023-01-06 MED ORDER — INSULIN ASPART 100 UNIT/ML IJ SOLN: 0.0000 [IU] | INTRAMUSCULAR | Status: DC | PRN

## 2023-01-06 MED ORDER — FENTANYL CITRATE (PF) 250 MCG/5ML IJ SOLN
INTRAMUSCULAR | Status: DC | PRN
  Administered 2023-01-06 (×2): 25 ug via INTRAVENOUS
  Administered 2023-01-06: 100 ug via INTRAVENOUS
  Administered 2023-01-06 (×2): 50 ug via INTRAVENOUS

## 2023-01-06 MED ORDER — IPRATROPIUM-ALBUTEROL 0.5-2.5 (3) MG/3ML IN SOLN
3.0000 mL | RESPIRATORY_TRACT | Status: DC
  Administered 2023-01-06: 3 mL via RESPIRATORY_TRACT

## 2023-01-06 MED ORDER — LACTATED RINGERS IV SOLN: INTRAVENOUS | Status: DC

## 2023-01-06 MED ORDER — BSS PLUS IO SOLN
INTRAOCULAR | Status: AC
  Filled 2023-01-06: qty 500

## 2023-01-06 MED ORDER — ROPIVACAINE HCL 5 MG/ML IJ SOLN
INTRAMUSCULAR | Status: AC
  Filled 2023-01-06: qty 30

## 2023-01-06 MED ORDER — ONDANSETRON HCL 4 MG/2ML IJ SOLN
INTRAMUSCULAR | Status: DC | PRN
  Administered 2023-01-06: 4 mg via INTRAVENOUS

## 2023-01-06 MED ORDER — ATROPINE SULFATE 1 % OP SOLN
1.0000 [drp] | OPHTHALMIC | Status: AC | PRN
  Administered 2023-01-06 (×3): 1 [drp] via OPHTHALMIC
  Filled 2023-01-06: qty 2

## 2023-01-06 MED ORDER — PROPOFOL 10 MG/ML IV BOLUS
INTRAVENOUS | Status: DC | PRN
  Administered 2023-01-06: 110 mg via INTRAVENOUS

## 2023-01-06 MED ORDER — ACETAMINOPHEN 325 MG PO TABS: 650.0000 mg | ORAL_TABLET | Freq: Once | ORAL | Status: AC

## 2023-01-06 MED ORDER — GLYCOPYRROLATE 0.2 MG/ML IJ SOLN
INTRAMUSCULAR | Status: DC | PRN
  Administered 2023-01-06: .2 mg via INTRAVENOUS

## 2023-01-06 MED ORDER — PHENYLEPHRINE HCL 2.5 % OP SOLN
1.0000 [drp] | OPHTHALMIC | Status: AC | PRN
  Administered 2023-01-06 (×3): 1 [drp] via OPHTHALMIC
  Filled 2023-01-06: qty 2

## 2023-01-06 MED ORDER — LIDOCAINE HCL 2 % IJ SOLN
INTRAMUSCULAR | Status: DC | PRN
  Administered 2023-01-06: 2 mL

## 2023-01-06 MED ORDER — SODIUM CHLORIDE 0.9 % IV SOLN: INTRAVENOUS | Status: DC

## 2023-01-06 MED ORDER — FENTANYL CITRATE (PF) 250 MCG/5ML IJ SOLN
INTRAMUSCULAR | Status: AC
  Filled 2023-01-06: qty 5

## 2023-01-06 MED ORDER — IPRATROPIUM-ALBUTEROL 0.5-2.5 (3) MG/3ML IN SOLN
RESPIRATORY_TRACT | Status: AC
  Filled 2023-01-06: qty 3

## 2023-01-06 MED ORDER — SUGAMMADEX SODIUM 200 MG/2ML IV SOLN
INTRAVENOUS | Status: DC | PRN
  Administered 2023-01-06: 200 mg via INTRAVENOUS

## 2023-01-06 MED ORDER — DEXAMETHASONE SODIUM PHOSPHATE 10 MG/ML IJ SOLN
INTRAMUSCULAR | Status: DC | PRN
  Administered 2023-01-06: 10 mg

## 2023-01-06 MED ORDER — BSS IO SOLN
INTRAOCULAR | Status: DC | PRN
  Administered 2023-01-06: 15 mL

## 2023-01-06 SURGICAL SUPPLY — 67 items
APL SRG 3 HI ABS STRL LF PLS (MISCELLANEOUS) ×2
APL SWBSTK 6 STRL LF DISP (MISCELLANEOUS) ×2
APPLICATOR COTTON TIP 6 STRL (MISCELLANEOUS) ×2 IMPLANT
APPLICATOR COTTON TIP 6IN STRL (MISCELLANEOUS) ×2
APPLICATOR DR MATTHEWS STRL (MISCELLANEOUS) ×2 IMPLANT
BAG COUNTER SPONGE SURGICOUNT (BAG) ×2 IMPLANT
BAG SPNG CNTER NS LX DISP (BAG) ×2
BAND SCLERAL BUCKLING TYPE 240 (Ophthalmic Related) IMPLANT
BAND SCLR 100X2.5X.6 TY 240 (Ophthalmic Related) ×2 IMPLANT
BAND WRIST GAS GREEN (MISCELLANEOUS) IMPLANT
BNDG EYE OVAL 2 1/8 X 2 5/8 (GAUZE/BANDAGES/DRESSINGS) IMPLANT
CANNULA ANT CHAM MAIN (OPHTHALMIC RELATED) IMPLANT
CORD BIPOLAR FORCEPS 12FT (ELECTRODE) IMPLANT
COVER MAYO STAND STRL (DRAPES) ×2 IMPLANT
COVER SURGICAL LIGHT HANDLE (MISCELLANEOUS) ×2 IMPLANT
DRAPE OPHTHALMIC 77X100 STRL (CUSTOM PROCEDURE TRAY) ×2 IMPLANT
FILTER BLUE MILLIPORE (MISCELLANEOUS) IMPLANT
FILTER STRAW FLUID ASPIR (MISCELLANEOUS) IMPLANT
GAS WRIST BAND GREEN (MISCELLANEOUS)
GLOVE SS BIOGEL STRL SZ 8.5 (GLOVE) ×2 IMPLANT
GOWN STRL REUS W/ TWL LRG LVL3 (GOWN DISPOSABLE) ×2 IMPLANT
GOWN STRL REUS W/ TWL XL LVL3 (GOWN DISPOSABLE) ×2 IMPLANT
GOWN STRL REUS W/TWL LRG LVL3 (GOWN DISPOSABLE) ×2
GOWN STRL REUS W/TWL XL LVL3 (GOWN DISPOSABLE) ×2
KIT BASIN OR (CUSTOM PROCEDURE TRAY) ×2 IMPLANT
KIT PERFLUORON PROCEDURE 5ML (MISCELLANEOUS) IMPLANT
KIT TURNOVER KIT B (KITS) ×2 IMPLANT
KNIFE GRIESHABER SHARP 2.5MM (MISCELLANEOUS) IMPLANT
LENS BIOM SUPER VIEW SET DISP (MISCELLANEOUS) IMPLANT
NDL 18GX1X1/2 (RX/OR ONLY) (NEEDLE) ×2 IMPLANT
NDL 25GX 5/8IN NON SAFETY (NEEDLE) IMPLANT
NDL FILTER BLUNT 18X1 1/2 (NEEDLE) ×2 IMPLANT
NDL HYPO 25GX1X1/2 BEV (NEEDLE) IMPLANT
NDL HYPO 30X.5 LL (NEEDLE) IMPLANT
NEEDLE 18GX1X1/2 (RX/OR ONLY) (NEEDLE) ×2 IMPLANT
NEEDLE 25GX 5/8IN NON SAFETY (NEEDLE) IMPLANT
NEEDLE FILTER BLUNT 18X1 1/2 (NEEDLE) ×2 IMPLANT
NEEDLE HYPO 25GX1X1/2 BEV (NEEDLE) IMPLANT
NEEDLE HYPO 30X.5 LL (NEEDLE) IMPLANT
NS IRRIG 1000ML POUR BTL (IV SOLUTION) ×2 IMPLANT
PACK FRAGMATOME (OPHTHALMIC) IMPLANT
PACK VITRECTOMY CUSTOM (CUSTOM PROCEDURE TRAY) ×2 IMPLANT
PAD ARMBOARD 7.5X6 YLW CONV (MISCELLANEOUS) ×4 IMPLANT
PAK PIK VITRECTOMY CVS 25GA (OPHTHALMIC) ×2 IMPLANT
REPL STRA BRUSH NDL (NEEDLE) IMPLANT
REPL STRA BRUSH NEEDLE (NEEDLE) IMPLANT
RESERVOIR BACK FLUSH (MISCELLANEOUS) IMPLANT
RETRACTOR IRIS FLEX 25G GRIESH (INSTRUMENTS) IMPLANT
ROLLS DENTAL (MISCELLANEOUS) IMPLANT
SET INJECTOR OIL FLUID CONSTEL (OPHTHALMIC) IMPLANT
SET VGFI TUBING 8065808002 (SET/KITS/TRAYS/PACK) IMPLANT
SHIELD EYE LENSE ONLY DISP (GAUZE/BANDAGES/DRESSINGS) IMPLANT
SLEEVE SCLERAL BUCK TYPE 70 (Ophthalmic Related) IMPLANT
STOCKINETTE IMPERVIOUS 9X36 MD (GAUZE/BANDAGES/DRESSINGS) ×4 IMPLANT
STOPCOCK 4 WAY LG BORE MALE ST (IV SETS) IMPLANT
SUT MERSILENE 5 0 RD 1 DA (SUTURE) ×4 IMPLANT
SUT SILK 2 0 (SUTURE) ×2
SUT SILK 2-0 18XBRD TIE 12 (SUTURE) ×2 IMPLANT
SUT VICRYL 7 0 TG140 8 (SUTURE) ×2 IMPLANT
SYR 20ML ECCENTRIC (SYRINGE) ×2 IMPLANT
SYR 30ML SLIP (SYRINGE) IMPLANT
SYR 5ML LL (SYRINGE) IMPLANT
SYR TB 1ML LUER SLIP (SYRINGE) IMPLANT
TIRE 9 BICON SCLERAL TYPE 287 (Ophthalmic Related) IMPLANT
TOWEL GREEN STERILE FF (TOWEL DISPOSABLE) ×2 IMPLANT
WATER STERILE IRR 1000ML POUR (IV SOLUTION) ×2 IMPLANT
WIPE INSTRUMENT VISIWIPE 73X73 (MISCELLANEOUS) IMPLANT

## 2023-01-06 NOTE — H&P (Signed)
History of Present Illness General:        77 year old female, history of hypertension, diabetes, COPD,   New onset vision loss right eye, with history of retinal tear treated 20 years ago     She is on oxygen at nighttime for COPD, does not need oxygen throughout the day.        patient states  She is doing better.        she saw cardiologist and got full workup   Current MedicationsTakingCalcium 600 MG Tablet 1 tablet with meals Orally Twice a day , Notes to Pharmacist: every other daymetFORMIN HCl ER 500 MG Tablet Extended Release 24 Hour 2 tablets by mouth once a day with evening mealProlia(Denosumab) 60 MG/ML Solution Prefilled Syringe 60 mg Subcutaneous every 6 monthsTrelegy Ellipta(Fluticasone-Umeclidin-Vilant) 100-62.5-25 MCG/INH Aerosol Powder Breath Activated 1 puff Inhalation Once a day , Notes to Pharmacist: sampleFreeStyle Freedom Lite(Blood Glucose Monitoring Suppl) w/Device Kit as directed finger stick as directedFreeStyle Lancets(Lancets) - Miscellaneous as directed finger stick once a dayFreeStyle Lite Test(Glucose Blood) - Strip as directed In Vitro once a day in the morning before breakfastLosartan Potassium 50 MG Tablet TAKE 1 TABLET BY MOUTH Orally twice a dayvalACYclovir HCl 500 MG Tablet TAKE 1 TABLET BY MOUTH EVERY 12 HOURS FOR 3 DAYS AS NEEDED FOR OUTBREAKRosuvastatin Calcium 10 MG Tablet 1 tablet Orally Once a dayFlonase Allergy Relief(Fluticasone Propionate) 50 MCG/ACT Suspension 1 spray in each nostril Nasally Once a daySteriLance TL(Lancets) - Miscellaneous USE AS DIRECTEDAspir-Low 81 MG Tablet Delayed Release 1 tablet Orally once a dayCetirizine HCl 10 MG Tablet 1 tablet Orally Once a dayCranberry 500 MG Capsule as directed Orally once a dayLysine 500 MG Tablet as directed Orally once a dayNebulizerOmeprazole 20 MG Capsule Delayed Release 1 capsule Orally every other day , Notes to Pharmacist: every other dayOxygen 1-6 liters as directed , Notes to Pharmacist: 2  litersVentolin HFA(Albuterol Sulfate HFA) 108 (90 Base) MCG/ACT Aerosol Solution 1 puff as needed Inhalation every 4 hrsVitamin D 25 MCG (1000 UT) Tablet 1 tablet Orally Once a dayWomens 50+ Multi Vitamin/Min - Tablet as directed Orally once a dayMelatonin 5 MG Capsule 1 capsule at bedtime as needed Orally Once a day , Notes to Pharmacist: 3mg  not 5mg Acetaminophen 325 MG Capsule 2 tablet as needed Orally every 6 hrsMiraLax(Polyethylene Glycol 3350) 17 GM Packet 1 packet mixed with 8 ounces of fluid Orally every 3 daysCentrum Silver 50+Women(Multiple Vitamins-Minerals) - Tablet as directed OrallyIbuprofen 200 MG Tablet 1 tablet with food or milk as needed Orally Three times a dayTramadol 50 mg capsules 1-2 capsules by mouth - MUST BE PREPARED FREE OF ALL MAMMALIAN PRODUCTS every 6 hrs, as needed for painMedication List reviewed and reconciled with the patientPast Medical History     hypertension/ [facility].     COPD-Dr. Gwenette Greet now Dr. [place] Bells Dr. Shearon Stalls .     DM dx 2010/ [facility].     Hyperlipidemia.     Allergies.     osteoporosis with fragility vertebral compression fracture of T7 Dr. Buddy Duty.     Genital herpes, valtrex as needed.     h/o migraines/tension HA.     colonoscopy, 04/12/2012, normal repeat 5 years due to history of polyps, Dr. Amedeo Plenty.     12/30/2014 eye exam, no retinopathy [facility] [place].     12/31/2015 no retinopathy seen on eye exam Dr. Sharen Counter. .     eye exam,/17/2018, no retinopathy, Dr. Sharen Counter.     colonoscopy 05/28/2018 3 tubular adenoma and one  serrated sessile adenoma repeat 3 years Dr. Therisa Doyne.     Inner fluid.     2023, march right breast cancer Dr. Chryl Heck.     endocrinologist Dr. Buddy Duty (osteoporosis).     cardiologist Dr. Terri Skains.Surgical History      breast lump removed, benign      dental work Shirlean Mylar of teeth      retinal tear, laser surgery      Colonoscopy 04-12-12      colonoscopy 05/28/18      breast biospy Dr. Ninfa Linden 01/2022      lumpectomy and lymph node biospsy Dr. Ninfa Linden 02/16/2022      I  had outpatient surgery for removal of breast cancer May 3rd, 2023Family HistoryFather: deceased, alcoholic, hx not well knownMother: deceased 42 yrs, COPD/smoker, osteoporosis with fragility vertebral compression fracturePaternal Grand Father: unknownPaternal Grand Mother: unknownMaternal Grand Father: deceased, unknown hxMaternal Grand Mother: deceased, unknown hxBrother 1: deceased, complication of alcoholismSister 1: deceased 41 yrs, complication of alcoholismSister 2: alive, CAD /MI, COPD lives in [place] brother(s) , 2 sister(s) .no children, neg GI family history husband passed 2018.Social HistoryGeneral: Tobacco use  cigarettes:  Former smoker, Quit in year  1998, Pack-year Hx:  73, Tobacco history last updated  09/15/2022, Vaping  No. Alcohol: no. Caffeine: coffee 3 kcups per day. DIET: regular. Exercise: treadmill limited by her lung disease. DENTAL CARE: Yearly visits. Marital Status: Married, 1996 [place] widowed Oct 14, 2017. Children: stepchildren x 3 grandchildren x 5 and two great grandchildren. EDUCATION: Some College. OCCUPATION: retired from working at [employer] living July 2012. COMMUNICATION BARRIERS: no hearing, vision or cognition issues on oxygen new. Religion: Media. Tobacco Exposure: mother smoked, and at work. Smoking: no. Quit smoking: no.AllergiesBacitracin: rash/ scarring - AllergyLevaquin: rash/itching - AllergyTelithromycin: stomach upset - Side EffectsHospitalization/Major Diagnostic Procedurenot in the past year 11/2023Review of SystemsGI PROCEDURE:        Pacemaker/ AICD no.  Artificial heart valves no.  MI/heart attack no.  Abnormal heart rhythm no.  Angina no.  CVA no.  Hypertension YES.  Hypotension no.  Asthma, COPD YES  COPD.  Sleep apnea no.  Seizure disorders no.  Artificial joints no.  Severe DJD no.  Diabetes YES, type II.  Significant headaches YES.  Vertigo YES.  Depression/anxiety no.  Abnormal bleeding YES, Taking blood thinners  81 mg.  Kidney  Disease no.  Liver disease no.  Chance of pregnancy no.  Blood transfusion no   Vital Signs Wt: 151.2, Wt  Examination  Ophthalmology: Vision  OD:  CF3 ft  IOP: 14  Exam:        GENERAL APPEARANCE: Well developed, well nourished, no active distress, pleasant, no acute distress .  EYES: Lids and conjunctiva normal. Sclera normal, pupils equal and reactive.  RESPIRATORY Breath sounds normal. Respiration even and unlabored.  CARDIOVASCULAR Normal RRR w/o murmers or gallops. No peripheral edema.  SKIN Warm and dry.  PSYCHIATRIC Alert and oriented x3, mood and affect appear normal..   Moderate NSC cataract OD. RETINA:  Temporal to inferior retinal detachment from 10 meridian to 6 meridian inferiorly.  Breaks at 10, and 7 meridian, macula off detachment  Imp: Rhegmatogenous retinal detachment right eye, multiple breaks NSC cataract OD Plan: Repair retinal detachment via scleral buckle, cryopexy and possible gas injection OD, under general anesthesia.

## 2023-01-06 NOTE — Anesthesia Preprocedure Evaluation (Addendum)
Anesthesia Evaluation  Patient identified by MRN, date of birth, ID band Patient awake    Reviewed: Allergy & Precautions, NPO status , Patient's Chart, lab work & pertinent test results  Airway Mallampati: I  TM Distance: >3 FB Neck ROM: Full    Dental  (+) Edentulous Upper, Partial Lower, Missing, Dental Advisory Given   Pulmonary shortness of breath, with exertion and Long-Term Oxygen Therapy, COPD,  COPD inhaler and oxygen dependent, former smoker 50 pack year history, 2LPM only at night   Pulmonary exam normal breath sounds clear to auscultation       Cardiovascular hypertension (151/69 preop, doesnt know where she normally runs), Pt. on medications Normal cardiovascular exam Rhythm:Regular Rate:Normal     Neuro/Psych  Headaches  negative psych ROS   GI/Hepatic Neg liver ROS,GERD  Controlled and Medicated,,  Endo/Other  diabetes, Well Controlled, Type 2, Oral Hypoglycemic Agents  A1c 6.9  Renal/GU negative Renal ROS  negative genitourinary   Musculoskeletal negative musculoskeletal ROS (+)    Abdominal   Peds  Hematology negative hematology ROS (+)   Anesthesia Other Findings   Reproductive/Obstetrics negative OB ROS                              Anesthesia Physical Anesthesia Plan  ASA: 4  Anesthesia Plan: General   Post-op Pain Management: Tylenol PO (pre-op)*   Induction: Intravenous  PONV Risk Score and Plan: 3 and Ondansetron, Dexamethasone and Treatment may vary due to age or medical condition  Airway Management Planned: Oral ETT  Additional Equipment: None  Intra-op Plan:   Post-operative Plan: Extubation in OR  Informed Consent: I have reviewed the patients History and Physical, chart, labs and discussed the procedure including the risks, benefits and alternatives for the proposed anesthesia with the patient or authorized representative who has indicated his/her  understanding and acceptance.     Dental advisory given  Plan Discussed with: CRNA  Anesthesia Plan Comments:          Anesthesia Quick Evaluation

## 2023-01-06 NOTE — Transfer of Care (Signed)
Immediate Anesthesia Transfer of Care Note  Patient: Yolanda Morris  Procedure(s) Performed: SCLERAL BUCKLE WITH CRYO PEXY (Right)  Patient Location: PACU  Anesthesia Type:General  Level of Consciousness: awake, alert , and oriented  Airway & Oxygen Therapy: Patient Spontanous Breathing  Post-op Assessment: Report given to RN and Post -op Vital signs reviewed and stable  Post vital signs: Reviewed and stable  Last Vitals:  Vitals Value Taken Time  BP 147/66 01/06/23 2018  Temp    Pulse 109 01/06/23 2025  Resp 21 01/06/23 2025  SpO2 93 % 01/06/23 2025  Vitals shown include unvalidated device data.  Last Pain:  Vitals:   01/06/23 1431  TempSrc: Oral  PainSc: 0-No pain         Complications: No notable events documented.

## 2023-01-06 NOTE — Anesthesia Procedure Notes (Signed)
Procedure Name: Intubation Date/Time: 01/06/2023 5:47 PM  Performed by: Elvin So, CRNAPre-anesthesia Checklist: Patient identified, Emergency Drugs available, Suction available and Patient being monitored Patient Re-evaluated:Patient Re-evaluated prior to induction Oxygen Delivery Method: Circle System Utilized Preoxygenation: Pre-oxygenation with 100% oxygen Induction Type: IV induction Ventilation: Mask ventilation without difficulty Laryngoscope Size: Mac and 3 Grade View: Grade I Tube type: Oral Tube size: 7.0 mm Number of attempts: 1 Airway Equipment and Method: Stylet and Oral airway Placement Confirmation: ETT inserted through vocal cords under direct vision, positive ETCO2 and breath sounds checked- equal and bilateral Secured at: 21 cm Tube secured with: Tape Dental Injury: Teeth and Oropharynx as per pre-operative assessment

## 2023-01-06 NOTE — Anesthesia Postprocedure Evaluation (Signed)
Anesthesia Post Note  Patient: Yolanda Morris  Procedure(s) Performed: SCLERAL BUCKLE WITH CRYO PEXY (Right) INSERTION OF GAS C3F8 (Right: Eye)     Patient location during evaluation: PACU Anesthesia Type: General Level of consciousness: sedated and patient cooperative Pain management: pain level controlled Vital Signs Assessment: post-procedure vital signs reviewed and stable Respiratory status: spontaneous breathing Cardiovascular status: stable Anesthetic complications: no Comments: Some mild hypoxemia in PACU. Pt denies SOB. Pt has oximeter at home and home O2 she wears at night. Encourged her to monitor her SpO2 closely and to wear O2 for SpO2< 90% and F/U with pulmonology ASAP    No notable events documented.  Last Vitals:  Vitals:   01/06/23 2100 01/06/23 2115  BP: (!) 166/78 (!) 151/69  Pulse: 95 83  Resp: 19 17  Temp:    SpO2: 99% 93%    Last Pain:  Vitals:   01/06/23 1431  TempSrc: Oral  PainSc: 0-No pain                 Nolon Nations

## 2023-01-07 ENCOUNTER — Telehealth: Payer: Self-pay | Admitting: Internal Medicine

## 2023-01-07 ENCOUNTER — Encounter (HOSPITAL_COMMUNITY): Payer: Self-pay | Admitting: Ophthalmology

## 2023-01-07 DIAGNOSIS — J441 Chronic obstructive pulmonary disease with (acute) exacerbation: Secondary | ICD-10-CM

## 2023-01-07 MED ORDER — PREDNISONE 20 MG PO TABS: 40.0000 mg | ORAL_TABLET | Freq: Every day | ORAL | 0 refills | Status: DC

## 2023-01-07 NOTE — Telephone Encounter (Signed)
Patient had emergent surgery with general anesthesia for retinal detachment. Reporting sats in recovery in the mid 80s. Overnight she kept wearing oxygen and was having worsening congestion and cough. No fevers or chills. Oxygen saturations 93-94% on 2LNC. She is taking trelegy and nebulizer treatments x1 today and did feel better with this. Sounds like copd exacerbation with recent general anesthesia as the trigger. Recommending q4 hour albuterol nebs and go ahead and start prednisone 40 mg x 5 days if no improvement with this. Sent to pharmacy. Will have her follow up in office.

## 2023-01-09 NOTE — Telephone Encounter (Signed)
Called and spoke with patient. She stated that she was feeling much better this morning. She checked her O2 this morning and it was at 92% on room air. She started the prednisone yesterday as well as increased her breathing treatments. She has gone back to using 2L of O2 only at night.    I attempted to get her scheduled for an appt for this week but she stated that she wanted to wait. She has an appt with her surgeon this morning around 1030am and she will see what he has to say. She will call us back after the appt to get scheduled for an appt.    She is aware that she can call us back if anything is needed.   Nothing further needed at time of call.

## 2023-01-10 DIAGNOSIS — Z9889 Other specified postprocedural states: Secondary | ICD-10-CM | POA: Diagnosis not present

## 2023-01-10 DIAGNOSIS — H2513 Age-related nuclear cataract, bilateral: Secondary | ICD-10-CM | POA: Diagnosis not present

## 2023-01-10 DIAGNOSIS — H3341 Traction detachment of retina, right eye: Secondary | ICD-10-CM | POA: Diagnosis not present

## 2023-01-10 NOTE — Op Note (Signed)
[id number]  dictation number completed

## 2023-01-10 NOTE — Brief Op Note (Signed)
PreOp DX:  Rhegmatogenous Retinal detachment, macula off OD Post OP DX:  Same  Procedure:  Scleral Buckle , cryopexy, external drainage of subretinal fluid and injection C3F8 gas, 0.1 cc 100% concentration, inferonasally.   Surgeon:  Delanna Ahmadi, MD  Anesthesia:  GETA  Complications : none Blood loss: none   287 tire temporally, encircling band 240, with sleeve superonasally.

## 2023-01-10 NOTE — Op Note (Signed)
NAMEELETHA, FROH MEDICAL RECORD NO: [id number] ACCOUNT NO: [id number] DATE OF BIRTH: 1946-01-27 FACILITY: [facility] LOCATION: [facility] PHYSICIAN: Clent Demark. Drae Mitzel, MD  Operative Report   DATE OF PROCEDURE: 01/06/2023  PREOPERATIVE DIAGNOSES:   1.  Rhegmatogenous retinal detachment, macula-off, right eye. 2.  Cataract, right eye.  POSTOPERATIVE DIAGNOSES: 1.  Rhegmatogenous retinal detachment, macula-off, right eye. 2.  Cataract, right eye.  PROCEDURE PERFORMED:  Scleral buckle with retinal cryopexy, external drainage subretinal fluid, injection of C3F8 100% concentration, 0.1 mL volume via inferonasal quadrant.  ANESTHESIA:  General endotracheal anesthesia.  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  Macula off retinal detachment detected and the patient proceeds with repair via scleral buckle and this cataractous state.  The patient understands the risks of anesthesia including recurrence, death, loss of the eye from the underlying condition including but not limited to hemorrhage, infection, scarring, need for another surgery, no change of vision, loss of vision and  progression of disease by intervention.  Appropriate signed consent was obtained.  DESCRIPTION OF PROCEDURE:  The patient was taken to the operating room.  In the operating room appropriate monitoring was followed by mild sedation.  Surgical timeout was carried out with the staff.  General endotracheal anesthesia was then switched out  without difficulty to the right eye.  Sterile prep and drape was carried out.  Conjunctival peritomy was then fashioned 360 degrees.  Each of the quadrants were entered with tenotomy scissors.  Inspection of the sclerae disclosed significant scleral dehiscence and show of the black choroid  in the temporal quadrant in the site  of the breaks and detachment.  At this time, retinal cryopexy was applied to the edges of the retinal break that are found there around the previous chorioretinal scarring as well as at the 3 o'clock and 9 o'clock as well as around the chorioretinal scarring at 10 to 11 o'clock.  At  this time, a [id number] silicone explant was selected and placed beneath the inferior lateral and superior rectus muscles with a 240 encircling band and a 70 sleeve temporarily secured in the supranasal quadrant.  Preplaced 5-0 Mersilene sutures were then  placed in the temporal quadrant as well as each of the superior and inferior quadrants and were tied temporarily.  A cutdown sclerotomy was then carried out in the bed of the buckle using diathermy instrumentation, with a sharp diathermy needle, the  choroid was pierced and external drainage of subretinal fluid proceeded with significant softening of the globe and significant removal of most of the subretinal fluid.  At this time, the buckle was then placed back into position and permanently tied  with excellent scleral indentation superiorly and temporally and inferiorly.  The band was then secured to proper tension.  At this time, the bed of the buckle was irrigated with bug juice.  Indirect ophthalmoscopy confirmed that there was only a shallow amount of subretinal fluid in the macular region and then slightly some inferiorly.  I judge that it was best to place  intravitreal gas injection and this was placed in the inferonasal quadrant via the pars plana with a 30 gauge needle filtered C3F8 0.1 mL in the inferonasal quadrant to avoid the detached retina.  At this time, intraocular pressure was assessed and found to be adequate.  There is no need to perform an AC tap.  At this time,  the conjunctiva was then closed with tenons to each of the wings of the rectus muscles in a 2 layer fashion.  Thereafter,  with conjunctival closure to the limbus.  Subconjunctival irrigation of bug  juice was carried out and then a subconjunctival injection of Decadron were placed.  Sterile patch and Fox shield applied.  The patient tolerated the procedure without  complication taken to the PACU in good and stable condition.   PUS D: 01/10/2023 1:05:53 pm T: 01/10/2023 1:27:00 pm  JOB: [id number]

## 2023-01-11 DIAGNOSIS — H3341 Traction detachment of retina, right eye: Secondary | ICD-10-CM | POA: Diagnosis not present

## 2023-01-19 DIAGNOSIS — H33021 Retinal detachment with multiple breaks, right eye: Secondary | ICD-10-CM | POA: Diagnosis not present

## 2023-01-19 DIAGNOSIS — H2513 Age-related nuclear cataract, bilateral: Secondary | ICD-10-CM | POA: Diagnosis not present

## 2023-01-19 DIAGNOSIS — H3341 Traction detachment of retina, right eye: Secondary | ICD-10-CM | POA: Diagnosis not present

## 2023-01-20 DIAGNOSIS — J449 Chronic obstructive pulmonary disease, unspecified: Secondary | ICD-10-CM | POA: Diagnosis not present

## 2023-01-20 DIAGNOSIS — I10 Essential (primary) hypertension: Secondary | ICD-10-CM | POA: Diagnosis not present

## 2023-01-20 DIAGNOSIS — E119 Type 2 diabetes mellitus without complications: Secondary | ICD-10-CM | POA: Diagnosis not present

## 2023-01-20 DIAGNOSIS — E785 Hyperlipidemia, unspecified: Secondary | ICD-10-CM | POA: Diagnosis not present

## 2023-01-27 DIAGNOSIS — L821 Other seborrheic keratosis: Secondary | ICD-10-CM | POA: Diagnosis not present

## 2023-01-27 DIAGNOSIS — D1801 Hemangioma of skin and subcutaneous tissue: Secondary | ICD-10-CM | POA: Diagnosis not present

## 2023-01-27 DIAGNOSIS — L814 Other melanin hyperpigmentation: Secondary | ICD-10-CM | POA: Diagnosis not present

## 2023-01-27 DIAGNOSIS — Z85828 Personal history of other malignant neoplasm of skin: Secondary | ICD-10-CM | POA: Diagnosis not present

## 2023-01-27 DIAGNOSIS — L820 Inflamed seborrheic keratosis: Secondary | ICD-10-CM | POA: Diagnosis not present

## 2023-01-27 DIAGNOSIS — L570 Actinic keratosis: Secondary | ICD-10-CM | POA: Diagnosis not present

## 2023-03-06 ENCOUNTER — Ambulatory Visit: Payer: Self-pay

## 2023-03-07 ENCOUNTER — Ambulatory Visit: Payer: Medicare Other | Attending: Hematology and Oncology | Admitting: Physical Therapy

## 2023-03-07 ENCOUNTER — Encounter: Payer: Self-pay | Admitting: Physical Therapy

## 2023-03-07 DIAGNOSIS — Z170 Estrogen receptor positive status [ER+]: Secondary | ICD-10-CM | POA: Insufficient documentation

## 2023-03-07 DIAGNOSIS — C50411 Malignant neoplasm of upper-outer quadrant of right female breast: Secondary | ICD-10-CM | POA: Insufficient documentation

## 2023-03-07 NOTE — Therapy (Signed)
OUTPATIENT PHYSICAL THERAPY SOZO SCREENING NOTE   Patient Name: Yolanda Morris MRN: [id number] DOB:30-Sep-1946, 77 y.o., female Today's Date: 03/07/2023  PCP: Merri Brunette, MD REFERRING PROVIDER: Rachel Moulds, MD   PT End of Session - 03/07/23 1303     Visit Number 13   unchanged due to screen only   Number of Visits 13    PT Start Time 1239    PT Stop Time 1243    PT Time Calculation (min) 4 min    Activity Tolerance Patient tolerated treatment well    Behavior During Therapy WFL for tasks assessed/performed             Past Medical History:  Diagnosis Date   Cancer (HCC)    skin   COPD (chronic obstructive pulmonary disease) (HCC)    DM (diabetes mellitus) (HCC)    Dyspnea    increased activity    Family history of adverse reaction to anesthesia    Family history of ovarian cancer 02/03/2022   Family history of stomach cancer 02/03/2022   Genital herpes    GERD (gastroesophageal reflux disease)    Hyperlipidemia    Migraines    Osteoporosis    Pneumonia    Unspecified essential hypertension    Past Surgical History:  Procedure Laterality Date   BREAST BIOPSY     BREAST EXCISIONAL BIOPSY     over 20 years ago- benign   BREAST LUMPECTOMY WITH RADIOACTIVE SEED AND SENTINEL LYMPH NODE BIOPSY Right 02/16/2022   Procedure: RIGHT BREAST LUMPECTOMY WITH RADIOACTIVE SEED AND SENTINEL LYMPH NODE BIOPSY;  Surgeon: Abigail Miyamoto, MD;  Location: [facility] OR;  Service: General;  Laterality: Right;   BUNIONECTOMY Left 2004   and hammer toe repair Left foot   COLON SURGERY     removal of polyps   COLONOSCOPY WITH PROPOFOL N/A 12/20/2022   Procedure: COLONOSCOPY WITH PROPOFOL;  Surgeon: Kerin Salen, MD;  Location: [facility];  Service: Gastroenterology;  Laterality: N/A;   DENTAL SURGERY     GAS INSERTION Right 01/06/2023   Procedure: INSERTION OF GAS C3F8;  Surgeon: Edmon Crape, MD;  Location: [facility] OR;  Service: Ophthalmology;  Laterality: Right;   POLYPECTOMY   12/20/2022   Procedure: POLYPECTOMY;  Surgeon: Kerin Salen, MD;  Location: [facility];  Service: Gastroenterology;;  polypectomy with cold forceps   RETINAL TEAR REPAIR CRYOTHERAPY     SCLERAL BUCKLE WITH CRYO Right 01/06/2023   Procedure: SCLERAL BUCKLE WITH CRYO PEXY;  Surgeon: Edmon Crape, MD;  Location: [facility];  Service: Ophthalmology;  Laterality: Right;   Patient Active Problem List   Diagnosis Date Noted   Genetic testing 02/09/2022   Family history of ovarian cancer 02/03/2022   Family history of stomach cancer 02/03/2022   Malignant neoplasm of upper-outer quadrant of right breast in female, estrogen receptor positive (HCC) 01/28/2022   Medication management 07/21/2020   Healthcare maintenance 07/21/2020   Body aches 07/22/2019   Chronic respiratory failure with hypoxia (HCC) 06/17/2019   Allergic rhinitis 11/13/2018   Shortness of breath 10/23/2018   COPD exacerbation (HCC) 01/07/2015   Cough 07/05/2013   Acute sinusitis 02/12/2013   Acute bronchitis 07/08/2011   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) 01/05/2011    REFERRING DIAG: right breast cancer at risk for lymphedema  THERAPY DIAG: Malignant neoplasm of upper-outer quadrant of right breast in female, estrogen receptor positive (HCC)  PERTINENT HISTORY: Patient was diagnosed on 12/27/2021 with right grade 2 invasive ductal carcinoma breast  cancer. It measures 8 mm and is located in the upper outer quadrant. It is weakly ER positive, PR negative, and HER2 negative with a Ki67 of 20%. She has stage 4 COPD and is on 2 liters of oxygen. Post Rt breast lumpectomy with no carcinoma in 4 removed nodes on 02/16/22. Will be having radiation.   PRECAUTIONS: right UE Lymphedema risk, None  SUBJECTIVE: Pt returns for her 3 month L-Dex screen.   PAIN:  Are you having pain? No  SOZO SCREENING: Patient was assessed today using the SOZO machine to determine the lymphedema index score. This was compared to her baseline score. It  was determined that she is within the recommended range when compared to her baseline and no further action is needed at this time. She will continue SOZO screenings. These are done every 3 months for 2 years post operatively followed by every 6 months for 2 years, and then annually.       [facility], PT 03/07/2023, 1:04 PM

## 2023-03-16 DIAGNOSIS — H2511 Age-related nuclear cataract, right eye: Secondary | ICD-10-CM | POA: Diagnosis not present

## 2023-03-16 DIAGNOSIS — H33021 Retinal detachment with multiple breaks, right eye: Secondary | ICD-10-CM | POA: Diagnosis not present

## 2023-03-16 DIAGNOSIS — H3341 Traction detachment of retina, right eye: Secondary | ICD-10-CM | POA: Diagnosis not present

## 2023-04-03 DIAGNOSIS — M810 Age-related osteoporosis without current pathological fracture: Secondary | ICD-10-CM | POA: Diagnosis not present

## 2023-04-03 DIAGNOSIS — Z9981 Dependence on supplemental oxygen: Secondary | ICD-10-CM | POA: Diagnosis not present

## 2023-04-03 DIAGNOSIS — D0511 Intraductal carcinoma in situ of right breast: Secondary | ICD-10-CM | POA: Diagnosis not present

## 2023-04-03 DIAGNOSIS — Z1331 Encounter for screening for depression: Secondary | ICD-10-CM | POA: Diagnosis not present

## 2023-04-03 DIAGNOSIS — E1169 Type 2 diabetes mellitus with other specified complication: Secondary | ICD-10-CM | POA: Diagnosis not present

## 2023-04-03 DIAGNOSIS — E785 Hyperlipidemia, unspecified: Secondary | ICD-10-CM | POA: Diagnosis not present

## 2023-04-03 DIAGNOSIS — I10 Essential (primary) hypertension: Secondary | ICD-10-CM | POA: Diagnosis not present

## 2023-04-03 DIAGNOSIS — J449 Chronic obstructive pulmonary disease, unspecified: Secondary | ICD-10-CM | POA: Diagnosis not present

## 2023-04-03 DIAGNOSIS — Z0000 Encounter for general adult medical examination without abnormal findings: Secondary | ICD-10-CM | POA: Diagnosis not present

## 2023-05-25 ENCOUNTER — Telehealth: Payer: Self-pay | Admitting: Internal Medicine

## 2023-05-25 NOTE — Telephone Encounter (Signed)
Needs a a new Prescription faxed into [facility] for her Trelegy 100 Fax: [phone]

## 2023-05-26 NOTE — Telephone Encounter (Signed)
Pt calling back to see if her forms has been faxed over to [facility] as of yet. Informed Pt we have not fax it yet and as soon as we do we will give her a call .

## 2023-05-29 DIAGNOSIS — H33001 Unspecified retinal detachment with retinal break, right eye: Secondary | ICD-10-CM | POA: Diagnosis not present

## 2023-05-29 DIAGNOSIS — H2513 Age-related nuclear cataract, bilateral: Secondary | ICD-10-CM | POA: Diagnosis not present

## 2023-05-29 DIAGNOSIS — H33301 Unspecified retinal break, right eye: Secondary | ICD-10-CM | POA: Diagnosis not present

## 2023-05-31 NOTE — Telephone Encounter (Signed)
Patient calling back to see if form has been faxed for [facility].

## 2023-06-01 DIAGNOSIS — T8529XA Other mechanical complication of intraocular lens, initial encounter: Secondary | ICD-10-CM | POA: Diagnosis not present

## 2023-06-01 DIAGNOSIS — Z48810 Encounter for surgical aftercare following surgery on the sense organs: Secondary | ICD-10-CM | POA: Diagnosis not present

## 2023-06-01 DIAGNOSIS — H3341 Traction detachment of retina, right eye: Secondary | ICD-10-CM | POA: Diagnosis not present

## 2023-06-01 DIAGNOSIS — H2589 Other age-related cataract: Secondary | ICD-10-CM | POA: Diagnosis not present

## 2023-06-01 DIAGNOSIS — Z978 Presence of other specified devices: Secondary | ICD-10-CM | POA: Diagnosis not present

## 2023-06-01 DIAGNOSIS — Z8669 Personal history of other diseases of the nervous system and sense organs: Secondary | ICD-10-CM | POA: Diagnosis not present

## 2023-06-05 ENCOUNTER — Other Ambulatory Visit: Payer: Self-pay

## 2023-06-05 MED ORDER — TRELEGY ELLIPTA 100-62.5-25 MCG/ACT IN AEPB: 1.0000 | INHALATION_SPRAY | Freq: Every day | RESPIRATORY_TRACT | 3 refills | Status: DC

## 2023-06-05 NOTE — Telephone Encounter (Signed)
Pt still has a couple of weeks, unless she doesn't get it in time she will take the offer

## 2023-06-05 NOTE — Telephone Encounter (Signed)
ATC X1 LVM for patient Please advise Trelegy script has been sent to [facility]. I apologize for the delay. Please ask if she is out of Trelegy. If so I will leave her samples up front since the delay is our fault

## 2023-06-05 NOTE — Telephone Encounter (Signed)
Closing encounter for now since nfn

## 2023-06-08 DIAGNOSIS — H2511 Age-related nuclear cataract, right eye: Secondary | ICD-10-CM | POA: Diagnosis not present

## 2023-06-08 DIAGNOSIS — T85398A Other mechanical complication of other ocular prosthetic devices, implants and grafts, initial encounter: Secondary | ICD-10-CM | POA: Diagnosis not present

## 2023-06-08 DIAGNOSIS — H33021 Retinal detachment with multiple breaks, right eye: Secondary | ICD-10-CM | POA: Diagnosis not present

## 2023-06-08 DIAGNOSIS — Z9889 Other specified postprocedural states: Secondary | ICD-10-CM | POA: Diagnosis not present

## 2023-06-08 DIAGNOSIS — H3341 Traction detachment of retina, right eye: Secondary | ICD-10-CM | POA: Diagnosis not present

## 2023-06-08 DIAGNOSIS — H2512 Age-related nuclear cataract, left eye: Secondary | ICD-10-CM | POA: Diagnosis not present

## 2023-06-09 ENCOUNTER — Encounter: Payer: Self-pay | Admitting: Primary Care

## 2023-06-09 ENCOUNTER — Ambulatory Visit: Payer: Medicare Other | Admitting: Primary Care

## 2023-06-09 VITALS — BP 110/60 | HR 70 | Ht 63.0 in | Wt 147.8 lb

## 2023-06-09 DIAGNOSIS — J4489 Other specified chronic obstructive pulmonary disease: Secondary | ICD-10-CM

## 2023-06-09 DIAGNOSIS — J439 Emphysema, unspecified: Secondary | ICD-10-CM | POA: Diagnosis not present

## 2023-06-09 MED ORDER — ALBUTEROL SULFATE HFA 108 (90 BASE) MCG/ACT IN AERS: 2.0000 | INHALATION_SPRAY | Freq: Four times a day (QID) | RESPIRATORY_TRACT | 3 refills | Status: AC | PRN

## 2023-06-09 NOTE — Patient Instructions (Addendum)
Recommendations: - Continue Trelegy one puff daily in the morning - Use Albuterol rescue inhaler or nebulizer every 4-6 hours as  - Continue to wear 2L oxygen with exertion and at bedtime  - Up to date with pneumonia and RSV vaccine. Make sure to get annual covid and flu vaccines   Follow-up: - 6 months with Dr. Celine Mans or sooner if needed   COPD and Physical Activity Chronic obstructive pulmonary disease (COPD) is a long-term, or chronic, condition that affects the lungs. COPD is a general term that can be used to describe many problems that cause inflammation of the lungs and limit airflow. These conditions include chronic bronchitis and emphysema. The main symptom of COPD is shortness of breath, which makes it harder to do even simple tasks. This can also make it harder to exercise and stay active. Talk with your health care provider about treatments to help you breathe better and actions you can take to prevent breathing problems during physical activity. What are the benefits of exercising when you have COPD? Exercising regularly is an important part of a healthy lifestyle. You can still exercise and do physical activities even though you have COPD. Exercise and physical activity improve your shortness of breath by increasing blood flow (circulation). This causes your heart to pump more oxygen through your body. Moderate exercise can: Improve oxygen use. Increase your energy level. Help with shortness of breath. Strengthen your breathing muscles. Improve heart health. Help with sleep. Improve your self-esteem and feelings of self-worth. Lower depression, stress, and anxiety. Exercise can benefit everyone with COPD. The severity of your disease may affect how hard you can exercise, especially at first, but everyone can benefit. Talk with your health care provider about how much exercise is safe for you, and which activities and exercises are safe for you. What actions can I take to  prevent breathing problems during physical activity? Sign up for a pulmonary rehabilitation program. This type of program may include: Education about lung diseases. Exercise classes that teach you how to exercise and be more active while improving your breathing. This usually involves: Exercise using your lower extremities, such as a stationary bicycle. About 30 minutes of exercise, 2 to 5 times per week, for 6 to 12 weeks. Strength training, such as push-ups or leg lifts. Nutrition education. Group classes in which you can talk with others who also have COPD and learn ways to manage stress. If you use an oxygen tank, you should use it while you exercise. Work with your health care provider to adjust your oxygen for your physical activity. Your resting flow rate is different from your flow rate during physical activity. How to manage your breathing while exercising While you are exercising: Take slow breaths. Pace yourself, and do nottry to go too fast. Purse your lips while breathing out. Pursing your lips is similar to a kissing or whistling position. If doing exercise that uses a quick burst of effort, such as weight lifting: Breathe in before starting the exercise. Breathe out during the hardest part of the exercise, such as raising the weights. Where to find support You can find support for exercising with COPD from: Your health care provider. A pulmonary rehabilitation program. Your local health department or community health programs. Support groups, either online or in-person. Your health care provider may be able to recommend support groups. Where to find more information You can find more information about exercising with COPD from: American Lung Association: lung.org COPD Foundation: copdfoundation.org Contact  a health care provider if: Your symptoms get worse. You have nausea. You have a fever. You want to start a new exercise program or a new activity. Get help right  away if: You have chest pain. You cannot breathe. These symptoms may represent a serious problem that is an emergency. Do not wait to see if the symptoms will go away. Get medical help right away. Call your local emergency services (911 in the [place] Do not drive yourself to the hospital. Summary COPD is a general term that can be used to describe many different lung problems that cause lung inflammation and limit airflow. This includes chronic bronchitis and emphysema. Exercise and physical activity improve your shortness of breath by increasing blood flow (circulation). This causes your heart to provide more oxygen to your body. Contact your health care provider before starting any exercise program or new activity. Ask your health care provider what exercises and activities are safe for you. This information is not intended to replace advice given to you by your health care provider. Make sure you discuss any questions you have with your health care provider. Document Revised: 08/11/2020 Document Reviewed: 08/11/2020 Elsevier Patient Education  2024 [employer].

## 2023-06-09 NOTE — Progress Notes (Signed)
@Patient  ID: Yolanda Morris, female    DOB: 08-22-1946, 77 y.o.   MRN: [id number]  Chief Complaint  Patient presents with   Follow-up    prednisone phosphate0.5, Moxiflaxin 0.5%, Bromfenac 0.075% eye drops (could not add to med list)    Referring provider: Merri Brunette, MD  HPI: 77 year old female, former smoker quit 1998 (50-pack-year history).  Past medical history significant for COPD, chronic respiratory failure O2 dependent, allergic rhinitis, breast cancer s/p lumpectomy with radiation in July 2023.  Patient of Dr. Celine Mans, last seen on 06/09/22.   06/09/2023 Patient presents today for overdue follow-up. Patient has severe COPD and chronic respiratory failure. She is doing well breathing wise, compliant with maintenance inhaler. She uses nebulizer every morning before using Trelegy. She carries rescue inhaler with her at all times but has not needed to use in quite some time. She has her portable oxygen with her. O2 93% RA. Wearing oxygen at night. She is getting patient assistance with [employer].   She had retina/cataract surgery last week, using three separate eye drops (prednisone phosphate0.5, Moxiflaxin 0.5%, Bromfenac 0.075%).  Medications- Trelegy  SABA use- Albuterol nebulizer daily in the morning; Rare HFA use  Cough- rare upper airway cough, no sputum production Oxygen - 2L supplemental oxygen  DME- [employer]  Vaccines- up to date with RSV and pneumococcal vaccine   Allergies  Allergen Reactions   Telithromycin     GI Upset    Bacitracin Rash    Scarring     Levofloxacin Rash    Immunization History  Administered Date(s) Administered   Fluad Quad(high Dose 65+) 06/17/2019, 06/30/2021   Influenza Split 06/18/2011, 07/10/2012, 07/30/2013, 08/17/2013, 07/15/2014, 06/18/2015, 07/05/2016, 07/21/2020   Influenza, High Dose Seasonal PF 06/09/2011, 07/10/2012, 07/12/2017, 07/11/2018, 07/21/2020   Influenza,inj,Quad PF,6+ Mos 07/07/2015, 07/11/2016    Influenza-Unspecified 07/16/2014, 07/16/2018   Moderna SARS-COV2 Booster Vaccination 03/09/2021   Moderna Sars-Covid-2 Vaccination 03/09/2021   PFIZER(Purple Top)SARS-COV-2 Vaccination 11/21/2019, 12/17/2019, 08/04/2020   Pfizer Covid-19 Vaccine Bivalent Booster 9yrs & up 07/20/2021, 04/14/2022   Pneumococcal Conjugate-13 12/18/2013   Pneumococcal Polysaccharide-23 07/10/2012   Respiratory Syncytial Virus Vaccine,Recomb Aduvanted(Arexvy) 07/12/2022   Tdap 07/10/2012, 03/22/2022   Zoster Recombinant(Shingrix) 12/16/2018, 04/16/2019   Zoster, Live 04/24/2008, 12/16/2018, 04/16/2019    Past Medical History:  Diagnosis Date   Cancer (HCC)    skin   COPD (chronic obstructive pulmonary disease) (HCC)    DM (diabetes mellitus) (HCC)    Dyspnea    increased activity    Family history of adverse reaction to anesthesia    Family history of ovarian cancer 02/03/2022   Family history of stomach cancer 02/03/2022   Genital herpes    GERD (gastroesophageal reflux disease)    Hyperlipidemia    Migraines    Osteoporosis    Pneumonia    Unspecified essential hypertension     Tobacco History: Social History   Tobacco Use  Smoking Status Former   Current packs/day: 0.00   Average packs/day: 2.0 packs/day for 25.0 years (50.0 ttl pk-yrs)   Types: Cigarettes   Start date: 10/18/1971   Quit date: 10/17/1996   Years since quitting: 26.6  Smokeless Tobacco Never   Counseling given: Not Answered   Outpatient Medications Prior to Visit  Medication Sig Dispense Refill   b complex vitamins capsule Take 1 capsule by mouth daily.     acetaminophen (TYLENOL) 325 MG tablet Take 650 mg by mouth every 6 (six) hours as needed for moderate pain.  albuterol (PROVENTIL) (2.5 MG/3ML) 0.083% nebulizer solution Take 3 mLs (2.5 mg total) by nebulization every 6 (six) hours as needed for wheezing or shortness of breath. (Patient taking differently: Take 2.5 mg by nebulization daily.) 360 mL 12   aspirin  EC 81 MG EC tablet Take 81 mg by mouth daily.     CALCIUM CITRATE PO Take 600 mg by mouth every other day.     cetirizine (ZYRTEC) 10 MG tablet Take 10 mg by mouth daily.     Cholecalciferol (VITAMIN D3) 1000 UNITS CAPS Take 1,000 Units by mouth daily.     COVID-19 mRNA bivalent vaccine, Pfizer, (PFIZER COVID-19 VAC BIVALENT) injection Inject into the muscle. 0.3 mL 0   Cranberry 500 MG TABS Take 500 mg by mouth daily.     denosumab (PROLIA) 60 MG/ML SOSY injection Inject 60 mg into the skin every 6 (six) months.     fluticasone (FLONASE) 50 MCG/ACT nasal spray Place 1 spray into both nostrils daily.     Fluticasone-Umeclidin-Vilant (TRELEGY ELLIPTA) 100-62.5-25 MCG/ACT AEPB Inhale 1 puff into the lungs daily. 60 each 3   hydrocortisone cream 1 % Apply 1 Application topically 2 (two) times daily as needed for itching (rash).     ibuprofen (ADVIL) 200 MG tablet Take 200 mg by mouth every 6 (six) hours as needed for moderate pain.     IGLUCOSE TEST STRIPS test strip See admin instructions.     L-Lysine 500 MG CAPS Take 500 mg by mouth daily.     Lancets (STERILANCE TL) MISC See admin instructions.     losartan (COZAAR) 50 MG tablet TAKE 1 TABLET(50 MG) BY MOUTH TWICE DAILY 180 tablet 0   Melatonin 3 MG CAPS Take 3 mg by mouth at bedtime.     metFORMIN (GLUCOPHAGE-XR) 500 MG 24 hr tablet Take 1,000 mg by mouth daily after supper.     Multiple Vitamins-Minerals (CENTRUM SILVER PO) Take 1 tablet by mouth daily.  Women 50+     omeprazole (PRILOSEC) 20 MG capsule Take 20 mg by mouth every other day.      OXYGEN Inhale 2 L into the lungs daily as needed (SOB).     polyethylene glycol (MIRALAX / GLYCOLAX) 17 g packet Take 17 g by mouth daily as needed for moderate constipation.     predniSONE (DELTASONE) 20 MG tablet Take 2 tablets (40 mg total) by mouth daily with breakfast. 10 tablet 0   rosuvastatin (CRESTOR) 10 MG tablet Take 1 tablet (10 mg total) by mouth at bedtime. 90 tablet 0   RSV vaccine  recomb adjuvanted (AREXVY) 120 MCG/0.5ML injection Inject into the muscle. 1 each 0   tobramycin-dexamethasone (TOBRADEX) ophthalmic solution Place 1 drop into the right eye in the morning, at noon, and at bedtime.     traMADol (ULTRAM) 50 MG tablet Take 1 tablet (50 mg total) by mouth every 6 (six) hours as needed. 20 tablet 0   triamcinolone cream (KENALOG) 0.5 % Apply 1 Application topically daily as needed (cracked skin).     valACYclovir (VALTREX) 500 MG tablet Take 500 mg by mouth 2 (two) times daily as needed (outbreaks). For 3 days     albuterol (VENTOLIN HFA) 108 (90 Base) MCG/ACT inhaler Inhale 2 puffs into the lungs every 6 (six) hours as needed for wheezing or shortness of breath. 3 each 3   No facility-administered medications prior to visit.   Review of Systems  Review of Systems  Constitutional: Negative.   HENT: Negative.  Respiratory:  Negative for shortness of breath and wheezing.     Physical Exam  BP 110/60 (BP Location: Left Arm, Cuff Size: Normal)   Pulse 70   Ht 5\' 3"  (1.6 m)   Wt 147 lb 12.8 oz (67 kg)   SpO2 93%   BMI 26.18 kg/m  Physical Exam Constitutional:      Appearance: Normal appearance.  HENT:     Head: Normocephalic and atraumatic.     Mouth/Throat:     Mouth: Mucous membranes are moist.     Pharynx: Oropharynx is clear.  Cardiovascular:     Rate and Rhythm: Normal rate and regular rhythm.  Pulmonary:     Effort: Pulmonary effort is normal.     Breath sounds: Normal breath sounds. No wheezing, rhonchi or rales.  Skin:    General: Skin is warm and dry.  Neurological:     General: No focal deficit present.     Mental Status: She is alert and oriented to person, place, and time. Mental status is at baseline.  Psychiatric:        Mood and Affect: Mood normal.        Behavior: Behavior normal.        Thought Content: Thought content normal.        Judgment: Judgment normal.      Lab Results:  CBC    Component Value Date/Time    WBC 8.9 01/06/2023 1503   RBC 4.76 01/06/2023 1503   HGB 14.6 01/06/2023 1503   HGB 14.9 07/07/2022 0855   HCT 42.9 01/06/2023 1503   PLT 265 01/06/2023 1503   PLT 249 07/07/2022 0855   MCV 90.1 01/06/2023 1503   MCH 30.7 01/06/2023 1503   MCHC 34.0 01/06/2023 1503   RDW 12.7 01/06/2023 1503   LYMPHSABS 1.4 07/07/2022 0855   MONOABS 0.6 07/07/2022 0855   EOSABS 0.2 07/07/2022 0855   BASOSABS 0.0 07/07/2022 0855    BMET    Component Value Date/Time   NA 137 01/06/2023 1503   NA 141 12/30/2021 0928   K 4.0 01/06/2023 1503   CL 100 01/06/2023 1503   CO2 23 01/06/2023 1503   GLUCOSE 117 (H) 01/06/2023 1503   BUN 8 01/06/2023 1503   BUN 11 12/30/2021 0928   CREATININE 0.52 01/06/2023 1503   CREATININE 0.59 07/07/2022 0855   CALCIUM 9.8 01/06/2023 1503   GFRNONAA >60 01/06/2023 1503   GFRNONAA >60 07/07/2022 0855    BNP No results found for: "BNP"  ProBNP    Component Value Date/Time   PROBNP 18.0 10/23/2018 1226    Imaging: No results found.   Assessment & Plan:   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) Stable; Doing well breathing wise. No recent exacerbations. No sputum production. Uses Albuterol nebulizer in the morning prior to Trelegy. No additon SABA need. Getting patient assistance through [employer].   Recommendations: - Continue Trelegy one puff daily in the morning - Use Albuterol rescue inhaler or nebulizer every 4-6 hours as  - Continue to wear 2L oxygen with exertion and at bedtime  - Up to date with pneumonia and RSV vaccine. Make sure to get annual covid and flu vaccines   Follow-up: - 6 months with Dr. Celine Mans or sooner if needed   Glenford Bayley, NP 06/17/2023

## 2023-06-17 NOTE — Assessment & Plan Note (Signed)
Stable; Doing well breathing wise. No recent exacerbations. No sputum production. Uses Albuterol nebulizer in the morning prior to Trelegy. No additon SABA need. Getting patient assistance through [employer].   Recommendations: - Continue Trelegy one puff daily in the morning - Use Albuterol rescue inhaler or nebulizer every 4-6 hours as  - Continue to wear 2L oxygen with exertion and at bedtime  - Up to date with pneumonia and RSV vaccine. Make sure to get annual covid and flu vaccines   Follow-up: - 6 months with Dr. Celine Mans or sooner if needed

## 2023-06-22 DIAGNOSIS — Z23 Encounter for immunization: Secondary | ICD-10-CM | POA: Diagnosis not present

## 2023-06-26 ENCOUNTER — Ambulatory Visit: Payer: Medicare Other | Attending: Hematology and Oncology

## 2023-06-26 VITALS — Wt 147.2 lb

## 2023-06-26 DIAGNOSIS — Z483 Aftercare following surgery for neoplasm: Secondary | ICD-10-CM | POA: Insufficient documentation

## 2023-06-26 NOTE — Therapy (Signed)
OUTPATIENT PHYSICAL THERAPY SOZO SCREENING NOTE   Patient Name: Yolanda Morris MRN: [id number] DOB:December 31, 1945, 77 y.o., female Today's Date: 06/26/2023  PCP: Merri Brunette, MD REFERRING PROVIDER: Rachel Moulds, MD   PT End of Session - 06/26/23 1045     Visit Number 13   # unchanged due to screen only   PT Start Time 1042    PT Stop Time 1047    PT Time Calculation (min) 5 min    Activity Tolerance Patient tolerated treatment well    Behavior During Therapy WFL for tasks assessed/performed             Past Medical History:  Diagnosis Date   Cancer (HCC)    skin   COPD (chronic obstructive pulmonary disease) (HCC)    DM (diabetes mellitus) (HCC)    Dyspnea    increased activity    Family history of adverse reaction to anesthesia    Family history of ovarian cancer 02/03/2022   Family history of stomach cancer 02/03/2022   Genital herpes    GERD (gastroesophageal reflux disease)    Hyperlipidemia    Migraines    Osteoporosis    Pneumonia    Unspecified essential hypertension    Past Surgical History:  Procedure Laterality Date   BREAST BIOPSY     BREAST EXCISIONAL BIOPSY     over 20 years ago- benign   BREAST LUMPECTOMY WITH RADIOACTIVE SEED AND SENTINEL LYMPH NODE BIOPSY Right 02/16/2022   Procedure: RIGHT BREAST LUMPECTOMY WITH RADIOACTIVE SEED AND SENTINEL LYMPH NODE BIOPSY;  Surgeon: Abigail Miyamoto, MD;  Location: [facility] OR;  Service: General;  Laterality: Right;   BUNIONECTOMY Left 2004   and hammer toe repair Left foot   COLON SURGERY     removal of polyps   COLONOSCOPY WITH PROPOFOL N/A 12/20/2022   Procedure: COLONOSCOPY WITH PROPOFOL;  Surgeon: Kerin Salen, MD;  Location: [facility];  Service: Gastroenterology;  Laterality: N/A;   DENTAL SURGERY     GAS INSERTION Right 01/06/2023   Procedure: INSERTION OF GAS C3F8;  Surgeon: Edmon Crape, MD;  Location[facility] OR;  Service: Ophthalmology;  Laterality: Right;   POLYPECTOMY  12/20/2022   Procedure:  POLYPECTOMY;  Surgeon: Kerin Salen, MD;  Location: [facility]COPY;  Service: Gastroenterology;;  polypectomy with cold forceps   RETINAL TEAR REPAIR CRYOTHERAPY     SCLERAL BUCKLE WITH CRYO Right 01/06/2023   Procedure: SCLERAL BUCKLE WITH CRYO PEXY;  Surgeon: Edmon Crape, MD;  Location: [facility] OR;  Service: Ophthalmology;  Laterality: Right;   Patient Active Problem List   Diagnosis Date Noted   Genetic testing 02/09/2022   Family history of ovarian cancer 02/03/2022   Family history of stomach cancer 02/03/2022   Malignant neoplasm of upper-outer quadrant of right breast in female, estrogen receptor positive (HCC) 01/28/2022   Medication management 07/21/2020   Healthcare maintenance 07/21/2020   Body aches 07/22/2019   Chronic respiratory failure with hypoxia (HCC) 06/17/2019   Allergic rhinitis 11/13/2018   Shortness of breath 10/23/2018   COPD exacerbation (HCC) 01/07/2015   Cough 07/05/2013   Acute sinusitis 02/12/2013   Acute bronchitis 07/08/2011   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) 01/05/2011    REFERRING DIAG: right breast cancer at risk for lymphedema  THERAPY DIAG: Aftercare following surgery for neoplasm  PERTINENT HISTORY: Patient was diagnosed on 12/27/2021 with right grade 2 invasive ductal carcinoma breast cancer. It measures 8 mm and is located in the upper outer quadrant. It is  weakly ER positive, PR negative, and HER2 negative with a Ki67 of 20%. She has stage 4 COPD and is on 2 liters of oxygen. Post Rt breast lumpectomy with no carcinoma in 4 removed nodes on 02/16/22. Will be having radiation.   PRECAUTIONS: right UE Lymphedema risk, None  SUBJECTIVE: Pt returns for her 3 month L-Dex screen.   PAIN:  Are you having pain? No  SOZO SCREENING: Patient was assessed today using the SOZO machine to determine the lymphedema index score. This was compared to her baseline score. It was determined that she is within the recommended range when compared to her  baseline and no further action is needed at this time. She will continue SOZO screenings. These are done every 3 months for 2 years post operatively followed by every 6 months for 2 years, and then annually.   L-DEX FLOWSHEETS - 06/26/23 1000       L-DEX LYMPHEDEMA SCREENING   Measurement Type Unilateral    L-DEX MEASUREMENT EXTREMITY Upper Extremity    POSITION  Standing    DOMINANT SIDE Right    At Risk Side Right    BASELINE SCORE (UNILATERAL) -9    L-DEX SCORE (UNILATERAL) -7.5    VALUE CHANGE (UNILAT) 1.5                [name], PTA 06/26/2023, 10:46 AM

## 2023-06-27 DIAGNOSIS — H40011 Open angle with borderline findings, low risk, right eye: Secondary | ICD-10-CM | POA: Diagnosis not present

## 2023-06-27 DIAGNOSIS — H2513 Age-related nuclear cataract, bilateral: Secondary | ICD-10-CM | POA: Diagnosis not present

## 2023-06-27 DIAGNOSIS — H3341 Traction detachment of retina, right eye: Secondary | ICD-10-CM | POA: Diagnosis not present

## 2023-06-27 DIAGNOSIS — T85398A Other mechanical complication of other ocular prosthetic devices, implants and grafts, initial encounter: Secondary | ICD-10-CM | POA: Diagnosis not present

## 2023-06-27 DIAGNOSIS — Z9889 Other specified postprocedural states: Secondary | ICD-10-CM | POA: Diagnosis not present

## 2023-06-27 DIAGNOSIS — H33021 Retinal detachment with multiple breaks, right eye: Secondary | ICD-10-CM | POA: Diagnosis not present

## 2023-07-08 LAB — AMB RESULTS CONSOLE CBG: Glucose: 119

## 2023-07-11 ENCOUNTER — Inpatient Hospital Stay: Payer: Medicare Other | Attending: Hematology and Oncology | Admitting: Hematology and Oncology

## 2023-07-11 VITALS — BP 162/60 | HR 89 | Temp 97.7°F | Resp 17 | Wt 149.0 lb

## 2023-07-11 DIAGNOSIS — R200 Anesthesia of skin: Secondary | ICD-10-CM | POA: Insufficient documentation

## 2023-07-11 DIAGNOSIS — I8391 Asymptomatic varicose veins of right lower extremity: Secondary | ICD-10-CM | POA: Diagnosis not present

## 2023-07-11 DIAGNOSIS — Z87891 Personal history of nicotine dependence: Secondary | ICD-10-CM | POA: Diagnosis not present

## 2023-07-11 DIAGNOSIS — Z8041 Family history of malignant neoplasm of ovary: Secondary | ICD-10-CM | POA: Diagnosis not present

## 2023-07-11 DIAGNOSIS — Z800 Family history of malignant neoplasm of digestive organs: Secondary | ICD-10-CM | POA: Diagnosis not present

## 2023-07-11 DIAGNOSIS — Z170 Estrogen receptor positive status [ER+]: Secondary | ICD-10-CM | POA: Diagnosis not present

## 2023-07-11 DIAGNOSIS — C50411 Malignant neoplasm of upper-outer quadrant of right female breast: Secondary | ICD-10-CM | POA: Insufficient documentation

## 2023-07-11 NOTE — Progress Notes (Signed)
[facility] CONSULT NOTE  Patient Care Team: Merri Brunette, MD as PCP - General (Family Medicine) Abigail Miyamoto, MD as Consulting Physician (General Surgery) Rachel Moulds, MD as Consulting Physician (Hematology and Oncology) Lonie Peak, MD as Attending Physician (Radiation Oncology) Talmage Coin, MD as Consulting Physician (Endocrinology) Tessa Lerner, DO as Consulting Physician (Cardiology) Charlott Holler, MD as Consulting Physician (Pulmonary Disease)  CHIEF COMPLAINTS/PURPOSE OF CONSULTATION:  Newly diagnosed breast cancer  HISTORY OF PRESENTING ILLNESS:  Yolanda Morris 77 y.o. female is here because of recent diagnosis of right breast IDC  I reviewed her records extensively and collaborated the history with the patient.  SUMMARY OF ONCOLOGIC HISTORY: Oncology History  Malignant neoplasm of upper-outer quadrant of right breast in female, estrogen receptor positive (HCC)  12/27/2021 Mammogram   Screening mammogram showed a right breast asymmetry warranting further investigation. Diagnostic mammogram showed 6 x 7 x 8 mm irregular hypoechoic right breast mass at 10 o'clock position 5 cm from the nipple.  No right axillary adenopathy   01/24/2022 Pathology Results   Pathology results showed right breast invasive ductal carcinoma, grade 2.  Prognostic showed ER 30%, positive weak staining intensity.  PR 0%, negative.  Ki-67 of 20%.  Tumor cells are negative for HER2 1+    01/28/2022 Initial Diagnosis   Malignant neoplasm of upper-outer quadrant of right breast in female, estrogen receptor positive (HCC)   02/02/2022 Cancer Staging   Staging form: Breast, AJCC 8th Edition - Pathologic: Stage IB (pT1b, pN0, cM0, G2, ER-, PR-, HER2-) - Signed by Rachel Moulds, MD on 03/01/2022 Histologic grading system: 3 grade system   02/08/2022 Genetic Testing   Negative hereditary cancer genetic testing: no pathogenic variants detected in [facility] BRCAPlus Panel and [employer]  CustomNext-Cancer +RNAinsight.  Report dates are February 08, 2022 and February 13, 2022.   The BRCAplus panel offered by [employer] and includes sequencing and deletion/duplication analysis for the following 8 genes: ATM, BRCA1, BRCA2, CDH1, CHEK2, PALB2, PTEN, and TP53.  The CustomNext-Cancer+RNAinsight panel offered by [employer] includes sequencing and rearrangement analysis for the following 47 genes:  APC, ATM, AXIN2, BARD1, BMPR1A, BRCA1, BRCA2, BRIP1, CDH1, CDK4, CDKN2A, CHEK2, DICER1, EPCAM, GREM1, HOXB13, MEN1, MLH1, MSH2, MSH3, MSH6, MUTYH, NBN, NF1, NF2, NTHL1, PALB2, PMS2, POLD1, POLE, PTEN, RAD51C, RAD51D, RECQL, RET, SDHA, SDHAF2, SDHB, SDHC, SDHD, SMAD4, SMARCA4, STK11, TP53, TSC1, TSC2, and VHL.  RNA data is routinely analyzed for use in variant interpretation for all genes.   02/16/2022 Surgery   Patient had right breast lumpectomy, deep sentinel lymph node biopsy on 02/16/2022.  Pathology showed invasive ductal carcinoma, 0.8 cm in maximal dimension, and 0.4 cm of the closest resection margin, DCIS coming to less than 0.1 cm of the closest resection margin.  No metastatic carcinoma in 4 out of 4 lymph nodes.  Prior prognostic showed ER 30% positive weak staining, PR 0% negative.  Repeat prognostics from final pathology showed ER negative, PR negative, KI of 15% and HER2 negative   03/31/2022 - 04/28/2022 Radiation Therapy   Site Technique Total Dose (Gy) Dose per Fx (Gy) Completed Fx Beam Energies  Breast, Right: Breast_R 3D 40.05/40.05 2.67 15/15 10XFFF  Breast, Right: Breast_R_Bst 3D 10/10 2 5/5 6X, 10X      History of Present Illness    The patient, with a history of breast cancer and recent eye surgeries, presents with persistent blurry vision in the right eye. The patient underwent three surgeries for retinal detachment and cataract. The first  surgery involved the placement of a scleral buckle and an air bubble, which did not successfully reposition the retina. The second surgery  involved the removal of fluid from the eye and the introduction of oil, which successfully repositioned the retina but worsened the patient's pre-existing cataract. The third surgery involved the removal of the cataract and the oil. The patient reports that her vision was initially clear following the third surgery but has since become blurry. The patient has been advised to take a B complex vitamin to potentially improve the blurriness, but it is unclear if this will be effective.  The patient also reports pain in the right leg and foot, which she attributes to varicose veins and possibly arthritis. The patient has had major dental work done recently, which has resulted in the insertion of permanent metal implants. The patient has been advised that she will not be able to undergo MRI scans due to these implants. The patient also reports numbness and pain in the right breast, which she understands to be expected following her cancer treatment.   Rest of the pertinent 10 point ROS reviewed and negative  MEDICAL HISTORY:  Past Medical History:  Diagnosis Date   Cancer (HCC)    skin   COPD (chronic obstructive pulmonary disease) (HCC)    DM (diabetes mellitus) (HCC)    Dyspnea    increased activity    Family history of adverse reaction to anesthesia    Family history of ovarian cancer 02/03/2022   Family history of stomach cancer 02/03/2022   Genital herpes    GERD (gastroesophageal reflux disease)    Hyperlipidemia    Migraines    Osteoporosis    Pneumonia    Unspecified essential hypertension     SURGICAL HISTORY: Past Surgical History:  Procedure Laterality Date   BREAST BIOPSY     BREAST EXCISIONAL BIOPSY     over 20 years ago- benign   BREAST LUMPECTOMY WITH RADIOACTIVE SEED AND SENTINEL LYMPH NODE BIOPSY Right 02/16/2022   Procedure: RIGHT BREAST LUMPECTOMY WITH RADIOACTIVE SEED AND SENTINEL LYMPH NODE BIOPSY;  Surgeon: Abigail Miyamoto, MD;  Location: [facility] OR;  Service: General;   Laterality: Right;   BUNIONECTOMY Left 2004   and hammer toe repair Left foot   COLON SURGERY     removal of polyps   COLONOSCOPY WITH PROPOFOL N/A 12/20/2022   Procedure: COLONOSCOPY WITH PROPOFOL;  Surgeon: Kerin Salen, MD;  Location: [facility];  Service: Gastroenterology;  Laterality: N/A;   DENTAL SURGERY     GAS INSERTION Right 01/06/2023   Procedure: INSERTION OF GAS C3F8;  Surgeon: Edmon Crape, MD;  [facility] OR;  Service: Ophthalmology;  Laterality: Right;   POLYPECTOMY  12/20/2022   Procedure: POLYPECTOMY;  Surgeon: Kerin Salen, MD;  Location: [facility];  Service: Gastroenterology;;  polypectomy with cold forceps   RETINAL TEAR REPAIR CRYOTHERAPY     SCLERAL BUCKLE WITH CRYO Right 01/06/2023   Procedure: SCLERAL BUCKLE WITH CRYO PEXY;  Surgeon: Edmon Crape, MD;  Location: [facility] OR;  Service: Ophthalmology;  Laterality: Right;    SOCIAL HISTORY: Social History   Socioeconomic History   Marital status: Widowed    Spouse name: Not on file   Number of children: Not on file   Years of education: Not on file   Highest education level: Not on file  Occupational History   Occupation: Marketing executive  Tobacco Use   Smoking status: Former    Current packs/day: 0.00    Average  packs/day: 2.0 packs/day for 25.0 years (50.0 ttl pk-yrs)    Types: Cigarettes    Start date: 10/18/1971    Quit date: 10/17/1996    Years since quitting: 26.7   Smokeless tobacco: Never  Vaping Use   Vaping status: Never Used  Substance and Sexual Activity   Alcohol use: No    Alcohol/week: 0.0 standard drinks of alcohol   Drug use: No   Sexual activity: Not Currently    Birth control/protection: Post-menopausal  Other Topics Concern   Not on file  Social History Narrative   No children   Social Determinants of Health   Financial Resource Strain: Not on file  Food Insecurity: Not on file  Transportation Needs: Not on file  Physical Activity: Not on file  Stress: Not on file   Social Connections: Not on file  Intimate Partner Violence: Not on file    FAMILY HISTORY: Family History  Problem Relation Age of Onset   COPD Mother        deceased   Heart disease Mother    Alcohol abuse Father    Alcohol abuse Sister        deceased   Heart attack Sister    Alcohol abuse Brother        deceased   Cancer Maternal Aunt        unknown type; d. 74   Cancer Maternal Uncle        unknown type; d. 51s   Ovarian cancer Paternal Aunt        d. 71   Stomach cancer Paternal Aunt 75   Breast cancer Neg Hx     ALLERGIES:  is allergic to telithromycin, bacitracin, and levofloxacin.  MEDICATIONS:  Current Outpatient Medications  Medication Sig Dispense Refill   acetaminophen (TYLENOL) 325 MG tablet Take 650 mg by mouth every 6 (six) hours as needed for moderate pain.     albuterol (PROVENTIL) (2.5 MG/3ML) 0.083% nebulizer solution Take 3 mLs (2.5 mg total) by nebulization every 6 (six) hours as needed for wheezing or shortness of breath. (Patient taking differently: Take 2.5 mg by nebulization daily.) 360 mL 12   albuterol (VENTOLIN HFA) 108 (90 Base) MCG/ACT inhaler Inhale 2 puffs into the lungs every 6 (six) hours as needed for wheezing or shortness of breath. Fill upon patient request 3 each 3   aspirin EC 81 MG EC tablet Take 81 mg by mouth daily.     b complex vitamins capsule Take 1 capsule by mouth daily.     CALCIUM CITRATE PO Take 600 mg by mouth every other day.     cetirizine (ZYRTEC) 10 MG tablet Take 10 mg by mouth daily.     Cholecalciferol (VITAMIN D3) 1000 UNITS CAPS Take 1,000 Units by mouth daily.     COVID-19 mRNA bivalent vaccine, Pfizer, (PFIZER COVID-19 VAC BIVALENT) injection Inject into the muscle. 0.3 mL 0   Cranberry 500 MG TABS Take 500 mg by mouth daily.     denosumab (PROLIA) 60 MG/ML SOSY injection Inject 60 mg into the skin every 6 (six) months.     fluticasone (FLONASE) 50 MCG/ACT nasal spray Place 1 spray into both nostrils daily.      Fluticasone-Umeclidin-Vilant (TRELEGY ELLIPTA) 100-62.5-25 MCG/ACT AEPB Inhale 1 puff into the lungs daily. 60 each 3   hydrocortisone cream 1 % Apply 1 Application topically 2 (two) times daily as needed for itching (rash).     ibuprofen (ADVIL) 200 MG tablet Take 200 mg by  mouth every 6 (six) hours as needed for moderate pain.     IGLUCOSE TEST STRIPS test strip See admin instructions.     L-Lysine 500 MG CAPS Take 500 mg by mouth daily.     Lancets (STERILANCE TL) MISC See admin instructions.     losartan (COZAAR) 50 MG tablet TAKE 1 TABLET(50 MG) BY MOUTH TWICE DAILY 180 tablet 0   Melatonin 3 MG CAPS Take 3 mg by mouth at bedtime.     metFORMIN (GLUCOPHAGE-XR) 500 MG 24 hr tablet Take 1,000 mg by mouth daily after supper.     Multiple Vitamins-Minerals (CENTRUM SILVER PO) Take 1 tablet by mouth daily.  Women 50+     omeprazole (PRILOSEC) 20 MG capsule Take 20 mg by mouth every other day.      OXYGEN Inhale 2 L into the lungs daily as needed (SOB).     polyethylene glycol (MIRALAX / GLYCOLAX) 17 g packet Take 17 g by mouth daily as needed for moderate constipation.     rosuvastatin (CRESTOR) 10 MG tablet Take 1 tablet (10 mg total) by mouth at bedtime. 90 tablet 0   RSV vaccine recomb adjuvanted (AREXVY) 120 MCG/0.5ML injection Inject into the muscle. 1 each 0   traMADol (ULTRAM) 50 MG tablet Take 1 tablet (50 mg total) by mouth every 6 (six) hours as needed. 20 tablet 0   triamcinolone cream (KENALOG) 0.5 % Apply 1 Application topically daily as needed (cracked skin).     valACYclovir (VALTREX) 500 MG tablet Take 500 mg by mouth 2 (two) times daily as needed (outbreaks). For 3 days     No current facility-administered medications for this visit.    REVIEW OF SYSTEMS:   Constitutional: Denies fevers, chills or abnormal night sweats Eyes: Denies blurriness of vision, double vision or watery eyes Ears, nose, mouth, throat, and face: Denies mucositis or sore throat Respiratory: Denies  cough, dyspnea or wheezes Cardiovascular: Denies palpitation, chest discomfort or lower extremity swelling Gastrointestinal:  Denies nausea, heartburn or change in bowel habits Skin: Denies abnormal skin rashes Lymphatics: Denies new lymphadenopathy or easy bruising Neurological:Denies numbness, tingling or new weaknesses Behavioral/Psych: Mood is stable, no new changes  Breast: Denies any palpable lumps or discharge All other systems were reviewed with the patient and are negative.  PHYSICAL EXAMINATION: ECOG PERFORMANCE STATUS: 1 - Symptomatic but completely ambulatory  Vitals:   07/11/23 1137  BP: (!) 162/60  Pulse: 89  Resp: 17  Temp: 97.7 F (36.5 C)  SpO2: 91%    Filed Weights   07/11/23 1137  Weight: 149 lb (67.6 kg)   Physical Exam Constitutional:      Appearance: Normal appearance.  Chest:     Comments: Bilateral breasts inspected.  Scattered density noted.  Postsurgical changes.  Otherwise no palpable masses or regional adenopathy. Musculoskeletal:        General: Normal range of motion.     Cervical back: Normal range of motion and neck supple. No rigidity.     Comments: Varicose veins  Lymphadenopathy:     Cervical: No cervical adenopathy.  Neurological:     Mental Status: She is alert.      LABORATORY DATA:  I have reviewed the data as listed Lab Results  Component Value Date   WBC 8.9 01/06/2023   HGB 14.6 01/06/2023   HCT 42.9 01/06/2023   MCV 90.1 01/06/2023   PLT 265 01/06/2023   Lab Results  Component Value Date   NA 137 01/06/2023  K 4.0 01/06/2023   CL 100 01/06/2023   CO2 23 01/06/2023    RADIOGRAPHIC STUDIES: I have personally reviewed the radiological reports and agreed with the findings in the report.  ASSESSMENT AND PLAN:  Malignant neoplasm of upper-outer quadrant of right breast in female, estrogen receptor positive (HCC) This is a very pleasant 77 year old postmenopausal female patient with newly diagnosed right breast  upper outer quadrant invasive ductal carcinoma, ER 30% weak, PR negative and HER2 negative KI of 20% presented in the breast MDC for recommendations.  Given the very small size of the tumor, we have recommended considering surgery upfront followed by consideration for adjuvant chemotherapy based on the final pathology.   She is now status post surgery, had right breast lumpectomy, final pathology showed 8 mm invasive ductal carcinoma, grade 2, triple negative, Ki67 of 15% Given final pathology showing tumor of 8 mm, we have discussed about considering adjuvant chemotherapy.  We have discussed about docetaxel and cyclophosphamide every 21 days for 4 cycles.  She understands that the role of chemotherapy is to reduce the risk of distant metastasis as well as local recurrence. She declined adjuvant chemo.  She completed adjuvant radiation and is now here for follow-up.      Post-operative Retinal Detachment Multiple surgeries including scleral buckle, air bubble, and silicone oil placement. Subsequent cataract formation and removal. Current complaint of persistent blurriness despite surgeries. -Continue B complex vitamins as recommended by ophthalmologist. -Follow up with ophthalmologist on 07/18/2023.  Breast Cancer Post-Operative Complaints of numbness and pain in the right breast. No concerning findings on physical examination. -Continue current management. -Order mammogram for March 2025. -Return for follow-up in six months. -Report any persistent or worsening breast changes, new bone pain, or new persistent cough.  Varicose Veins Complaints of pain in the right leg and foot. -Consider evaluation by a vascular specialist if symptoms persist or worsen.           Rachel Moulds, MD 07/11/23

## 2023-07-11 NOTE — Assessment & Plan Note (Signed)
This is a very pleasant 77 year old postmenopausal female patient with newly diagnosed right breast upper outer quadrant invasive ductal carcinoma, ER 30% weak, PR negative and HER2 negative KI of 20% presented in the breast MDC for recommendations.  Given the very small size of the tumor, we have recommended considering surgery upfront followed by consideration for adjuvant chemotherapy based on the final pathology.   She is now status post surgery, had right breast lumpectomy, final pathology showed 8 mm invasive ductal carcinoma, grade 2, triple negative, Ki67 of 15% Given final pathology showing tumor of 8 mm, we have discussed about considering adjuvant chemotherapy.  We have discussed about docetaxel and cyclophosphamide every 21 days for 4 cycles.  She understands that the role of chemotherapy is to reduce the risk of distant metastasis as well as local recurrence. She declined adjuvant chemo.  She completed adjuvant radiation and is now here for follow-up.      Post-operative Retinal Detachment Multiple surgeries including scleral buckle, air bubble, and silicone oil placement. Subsequent cataract formation and removal. Current complaint of persistent blurriness despite surgeries. -Continue B complex vitamins as recommended by ophthalmologist. -Follow up with ophthalmologist on 07/18/2023.  Breast Cancer Post-Operative Complaints of numbness and pain in the right breast. No concerning findings on physical examination. -Continue current management. -Order mammogram for March 2025. -Return for follow-up in six months. -Report any persistent or worsening breast changes, new bone pain, or new persistent cough.  Varicose Veins Complaints of pain in the right leg and foot. -Consider evaluation by a vascular specialist if symptoms persist or worsen.

## 2023-08-03 DIAGNOSIS — Z23 Encounter for immunization: Secondary | ICD-10-CM | POA: Diagnosis not present

## 2023-08-14 DIAGNOSIS — H33301 Unspecified retinal break, right eye: Secondary | ICD-10-CM | POA: Diagnosis not present

## 2023-08-14 DIAGNOSIS — H401133 Primary open-angle glaucoma, bilateral, severe stage: Secondary | ICD-10-CM | POA: Diagnosis not present

## 2023-08-14 DIAGNOSIS — H2512 Age-related nuclear cataract, left eye: Secondary | ICD-10-CM | POA: Diagnosis not present

## 2023-08-14 DIAGNOSIS — H33001 Unspecified retinal detachment with retinal break, right eye: Secondary | ICD-10-CM | POA: Diagnosis not present

## 2023-08-22 DIAGNOSIS — H401123 Primary open-angle glaucoma, left eye, severe stage: Secondary | ICD-10-CM | POA: Diagnosis not present

## 2023-08-25 ENCOUNTER — Telehealth: Payer: Self-pay | Admitting: Internal Medicine

## 2023-08-25 NOTE — Telephone Encounter (Signed)
PT spoke w/[facility] and needs a new signed Dr's order. Be sure to make it for 3 mo and they have to include a cover sheet.   Her number is [phone]   Fax for [facility]- [phone]

## 2023-08-28 ENCOUNTER — Encounter: Payer: Self-pay | Admitting: Internal Medicine

## 2023-08-28 MED ORDER — TRELEGY ELLIPTA 100-62.5-25 MCG/ACT IN AEPB: 1.0000 | INHALATION_SPRAY | Freq: Every day | RESPIRATORY_TRACT | 4 refills | Status: DC

## 2023-08-28 NOTE — Telephone Encounter (Signed)
Patient checking on message for [facility] for patient assistance Trelegy. Patient phone number is [phone].

## 2023-08-28 NOTE — Telephone Encounter (Signed)
Called and spoke with patient and advised her that the new script had been faxed.  She verbalized understanding.  Received confirmation fax that it was sent successfully.  Nothing further needed.

## 2023-08-28 NOTE — Telephone Encounter (Signed)
New prescription printed and signed by Ames Dura NP.  Faxed to [facility] at [phone].  Will await confirmation fax that it was sent successfully.

## 2023-08-29 NOTE — Telephone Encounter (Signed)
See Telephone Encounter dated 08/25/23. This has been done.

## 2023-09-04 ENCOUNTER — Telehealth: Payer: Self-pay | Admitting: Cardiology

## 2023-09-04 DIAGNOSIS — H2512 Age-related nuclear cataract, left eye: Secondary | ICD-10-CM | POA: Diagnosis not present

## 2023-09-04 DIAGNOSIS — H33301 Unspecified retinal break, right eye: Secondary | ICD-10-CM | POA: Diagnosis not present

## 2023-09-04 DIAGNOSIS — E119 Type 2 diabetes mellitus without complications: Secondary | ICD-10-CM | POA: Diagnosis not present

## 2023-09-04 DIAGNOSIS — Z961 Presence of intraocular lens: Secondary | ICD-10-CM | POA: Diagnosis not present

## 2023-09-04 DIAGNOSIS — H401133 Primary open-angle glaucoma, bilateral, severe stage: Secondary | ICD-10-CM | POA: Diagnosis not present

## 2023-09-04 DIAGNOSIS — H33001 Unspecified retinal detachment with retinal break, right eye: Secondary | ICD-10-CM | POA: Diagnosis not present

## 2023-09-04 NOTE — Telephone Encounter (Signed)
Pt c/o medication issue:  1. Name of Medication:   losartan (COZAAR) 50 MG tablet    2. How are you currently taking this medication (dosage and times per day)? TAKE 1 TABLET(50 MG) BY MOUTH TWICE DAILY   3. Are you having a reaction (difficulty breathing--STAT)? No  4. What is your medication issue?  Patient stated she takes 50 MG in the morning and 50 MG in the evening. Patient stated when she saw her ophthalmologist today, she was informed she should take it in the morning (the whole 100 MG) and would like to know if it would be okay. Please advise.

## 2023-09-04 NOTE — Telephone Encounter (Signed)
Agree, I also recommended that to her in the past.  But I recall she wanted to take it twice a day as she felt comfortable that way.  Nilan Iddings Sebree, DO, [facility]

## 2023-09-25 ENCOUNTER — Ambulatory Visit: Payer: Medicare Other | Attending: Hematology and Oncology

## 2023-09-25 ENCOUNTER — Ambulatory Visit: Payer: Medicare Other | Attending: Cardiology | Admitting: Cardiology

## 2023-09-25 ENCOUNTER — Encounter: Payer: Self-pay | Admitting: Cardiology

## 2023-09-25 VITALS — BP 123/69 | HR 79 | Resp 16 | Ht 63.0 in | Wt 148.0 lb

## 2023-09-25 VITALS — Wt 146.2 lb

## 2023-09-25 DIAGNOSIS — E782 Mixed hyperlipidemia: Secondary | ICD-10-CM | POA: Insufficient documentation

## 2023-09-25 DIAGNOSIS — I10 Essential (primary) hypertension: Secondary | ICD-10-CM | POA: Diagnosis not present

## 2023-09-25 DIAGNOSIS — E119 Type 2 diabetes mellitus without complications: Secondary | ICD-10-CM | POA: Insufficient documentation

## 2023-09-25 DIAGNOSIS — Z483 Aftercare following surgery for neoplasm: Secondary | ICD-10-CM | POA: Insufficient documentation

## 2023-09-25 DIAGNOSIS — I6523 Occlusion and stenosis of bilateral carotid arteries: Secondary | ICD-10-CM | POA: Diagnosis not present

## 2023-09-25 NOTE — Patient Instructions (Signed)
Medication Instructions:  Your physician recommends that you continue on your current medications as directed. Please refer to the Current Medication list given to you today.  *If you need a refill on your cardiac medications before your next appointment, please call your pharmacy*  Lab Work: None ordered today. If you have labs (blood work) drawn today and your tests are completely normal, you will receive your results only by: MyChart Message (if you have MyChart) OR A paper copy in the mail If you have any lab test that is abnormal or we need to change your treatment, we will call you to review the results.  Testing/Procedures: Your physician has requested that you have a carotid duplex. This test is an ultrasound of the carotid arteries in your neck. It looks at blood flow through these arteries that supply the brain with blood. Allow one hour for this exam. There are no restrictions or special instructions.   Follow-Up: At [facility], you and your health needs are our priority.  As part of our continuing mission to provide you with exceptional heart care, we have created designated Provider Care Teams.  These Care Teams include your primary Cardiologist (physician) and Advanced Practice Providers (APPs -  Physician Assistants and Nurse Practitioners) who all work together to provide you with the care you need, when you need it.  Your next appointment:   7 month(s) (July 2025)  The format for your next appointment:   In Person  Provider:   Tessa Lerner, DO {

## 2023-09-25 NOTE — Progress Notes (Signed)
Cardiology Office Note:  .   Date:  09/25/2023  ID:  Yolanda Morris, DOB [birth year], MRN [id number] PCP:  Merri Brunette, MD  Former Cardiology Providers: NA [facility] Providers Cardiologist:  Tessa Lerner, DO , [facility] (established care September 2022) Electrophysiologist:  None  Click to update primary MD,subspecialty MD or APP then REFRESH:1}    Chief Complaint  Patient presents with   Carotid atherosclerosis, bilateral   Follow-up    1 year     History of Present Illness: .   Yolanda Morris is a 77 y.o. 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   Patient is being followed by the practice given her carotid artery atherosclerosis.  She presents today for a 1 year follow-up visit.  Denies any anginal chest pain, heart failure symptoms, or focal neurological deficits.  Due to concerns for falls secondary to osteoporosis patient is been taking aspirin 81 mg twice a week.  Review of Systems: .   Review of Systems  Cardiovascular:  Negative for chest pain, claudication, irregular heartbeat, leg swelling, near-syncope, orthopnea, palpitations, paroxysmal nocturnal dyspnea and syncope.  Respiratory:  Negative for shortness of breath.   Hematologic/Lymphatic: Negative for bleeding problem.    Studies Reviewed:   EKG: EKG Interpretation Date/Time:  Monday September 25 2023 10:46:54 EST Text Interpretation: Normal sinus rhythm Low voltage QRS No previous ECGs available Confirmed by Tessa Lerner [id number]) on 09/25/2023 10:53:50 AM  Echocardiogram: 07/27/2021: Left ventricle cavity is normal in size. Mild concentric hypertrophy of the left ventricle. Normal global wall motion. Normal LV  systolic function with EF 61%. Doppler evidence of grade I (impaired) diastolic dysfunction, normal LAP.  Mild (Grade I) mitral regurgitation. Estimated right atrial pressure 8 mmHg.   Modified Bruce/Lexiscan Tetrofosmin stress test 07/14/2021: Lexiscan/modified Bruce nuclear stress test performed using 1-day protocol. Patient achieved 2.2 METS on modified Bruce protocol and reached 108% MPHR. Stress EKG at 108% MPHR showed sinus tachycardia, no ST-T wave abnormality. Normal myocardial perfusion. Stress LVEF 63%. Low risk study.  Carotid artery duplex 10/03/2022:  Duplex suggests stenosis in the right internal carotid artery (minimal).  Duplex suggests stenosis in the left internal carotid artery (minimal).  There is very mild homogenous plaque in the right  and heterogeneous plaque with shadowing in the left carotid artery.  Antegrade right vertebral artery flow. Antegrade left vertebral artery flow.  Compared to the study done on 07/27/2021, no significant change.  Left ICA  showed a 50 to 49% stenosis. Further studies if clinically indicated.   RADIOLOGY: NA  Risk Assessment/Calculations:   NA   Labs:       Latest Ref Rng & Units 01/06/2023    3:03 PM 07/07/2022    8:55 AM 02/02/2022   12:12 PM  CBC  WBC 4.0 - 10.5 K/uL 8.9  6.7  7.7   Hemoglobin 12.0 - 15.0 g/dL 84.6  96.2  95.2   Hematocrit 36.0 - 46.0 % 42.9  43.4  43.0   Platelets 150 - 400 K/uL 265  249  267        Latest Ref Rng & Units 01/06/2023    3:03 PM 07/07/2022    8:55 AM 02/02/2022   12:12 PM  BMP  Glucose 70 - 99 mg/dL 841  324  401  BUN 8 - 23 mg/dL 8  12  11    Creatinine 0.44 - 1.00 mg/dL 1.61  0.96  0.45   Sodium 135 - 145 mmol/L 137  140  140   Potassium 3.5 - 5.1 mmol/L 4.0  4.5  3.9   Chloride 98 - 111 mmol/L 100  102  101   CO2 22 - 32 mmol/L 23  33  33   Calcium 8.9 - 10.3 mg/dL 9.8  40.9  9.8       Latest Ref Rng & Units 01/06/2023    3:03 PM 07/07/2022    8:55 AM 02/02/2022   12:12 PM  CMP   Glucose 70 - 99 mg/dL 811  914  782   BUN 8 - 23 mg/dL 8  12  11    Creatinine 0.44 - 1.00 mg/dL 9.56  2.13  0.86   Sodium 135 - 145 mmol/L 137  140  140   Potassium 3.5 - 5.1 mmol/L 4.0  4.5  3.9   Chloride 98 - 111 mmol/L 100  102  101   CO2 22 - 32 mmol/L 23  33  33   Calcium 8.9 - 10.3 mg/dL 9.8  57.8  9.8   Total Protein 6.5 - 8.1 g/dL  7.2  7.6   Total Bilirubin 0.3 - 1.2 mg/dL  0.4  0.4   Alkaline Phos 38 - 126 U/L  64  74   AST 15 - 41 U/L  17  19   ALT 0 - 44 U/L  15  18     Lab Results  Component Value Date   CHOL 187 12/30/2021   HDL 69 12/30/2021   LDLCALC 94 12/30/2021   LDLDIRECT 84 12/30/2021   TRIG 137 12/30/2021   CHOLHDL 2.7 12/30/2021   No results for input(s): "LIPOA" in the last 8760 hours. No components found for: "NTPROBNP" No results for input(s): "PROBNP" in the last 8760 hours. No results for input(s): "TSH" in the last 8760 hours.   Physical Exam:    Today's Vitals   09/25/23 1039  BP: 123/69  Pulse: 79  Resp: 16  SpO2: 93%  Weight: 148 lb (67.1 kg)  Height: 5\' 3"  (1.6 m)   Body mass index is 26.22 kg/m. Wt Readings from Last 3 Encounters:  09/25/23 148 lb (67.1 kg)  07/11/23 149 lb (67.6 kg)  06/26/23 147 lb 4 oz (66.8 kg)    Physical Exam  Constitutional: No distress.  Age appropriate, hemodynamically stable.   Neck: No JVD present.  Cardiovascular: Normal rate, regular rhythm, S1 normal, S2 normal, intact distal pulses and normal pulses. Exam reveals no gallop, no S3 and no S4.  No murmur heard. Pulses:      Dorsalis pedis pulses are 2+ on the right side and 2+ on the left side.       Posterior tibial pulses are 2+ on the right side and 2+ on the left side.  Pulmonary/Chest: Effort normal and breath sounds normal. No stridor. She has no wheezes. She has no rales.  Thoracic spine illustrates kyphosis.  Abdominal: Soft. Bowel sounds are normal. She exhibits no distension. There is no abdominal tenderness.  Musculoskeletal:         General: No edema.     Cervical back: Neck supple.  Neurological: She is alert and oriented to person, place, and time. She has intact cranial nerves (2-12).  Skin: Skin is warm and moist.   Impression & Recommendation(s):  Impression:   ICD-10-CM  1. Carotid atherosclerosis, bilateral  I65.23 EKG 12-Lead    VAS US CAROTID    2. Mixed hyperlipidemia  E78.2     3. Benign hypertension  I10     4. Type 2 diabetes mellitus without complication, without long-term current use of insulin (HCC)  E11.9        Recommendation(s):  Carotid atherosclerosis, bilateral In the past noted to have bilateral carotid artery atherosclerosis. However, most recent carotid duplex notes improvement with very minimal atherosclerosis bilaterally. Given the concerns for possible bleed due to fall risk she is taking aspirin 81 mg twice a week. Since the carotid duplex has noted improvement in disease burden recommended discontinuation of aspirin for reasons mentioned above and to continue with lipid-lowering agents.  However, prior to discontinuation of aspirin she would like to repeat her carotid duplex to verify that bilateral disease burden is minimal.   For now we will proceed forward with a carotid duplex.   Mixed hyperlipidemia Currently on rosuvastatin.   She denies myalgia or other side effects. Most recent lipids dated March 2023, independently reviewed as noted above. Will have repeat lipids with PCP.  Benign hypertension Office blood pressures are very well-controlled. Continue losartan 100 mg p.o. every morning  Type 2 diabetes mellitus without complication, without long-term current use of insulin (HCC) Reemphasized importance of glycemic control. Currently on metformin, ARB, statin therapy.   Orders Placed:  Orders Placed This Encounter  Procedures   EKG 12-Lead    Final Medication List:   No orders of the defined types were placed in this encounter.   Medications Discontinued  During This Encounter  Medication Reason   Fluticasone-Umeclidin-Vilant (TRELEGY ELLIPTA) 100-62.5-25 MCG/ACT AEPB Duplicate     Current Outpatient Medications:    acetaminophen (TYLENOL) 325 MG tablet, Take 650 mg by mouth every 6 (six) hours as needed for moderate pain., Disp: , Rfl:    albuterol (PROVENTIL) (2.5 MG/3ML) 0.083% nebulizer solution, Take 3 mLs (2.5 mg total) by nebulization every 6 (six) hours as needed for wheezing or shortness of breath. (Patient taking differently: Take 2.5 mg by nebulization daily.), Disp: 360 mL, Rfl: 12   albuterol (VENTOLIN HFA) 108 (90 Base) MCG/ACT inhaler, Inhale 2 puffs into the lungs every 6 (six) hours as needed for wheezing or shortness of breath. Fill upon patient request, Disp: 3 each, Rfl: 3   aspirin EC 81 MG EC tablet, Take 81 mg by mouth 2 (two) times a week. Tues and Sat, Disp: , Rfl:    b complex vitamins capsule, Take 1 capsule by mouth daily., Disp: , Rfl:    CALCIUM CITRATE PO, Take 600 mg by mouth every other day., Disp: , Rfl:    cetirizine (ZYRTEC) 10 MG tablet, Take 10 mg by mouth daily., Disp: , Rfl:    Cholecalciferol (VITAMIN D3) 1000 UNITS CAPS, Take 1,000 Units by mouth daily., Disp: , Rfl:    COVID-19 mRNA bivalent vaccine, Pfizer, (PFIZER COVID-19 VAC BIVALENT) injection, Inject into the muscle., Disp: 0.3 mL, Rfl: 0   Cranberry 500 MG TABS, Take 500 mg by mouth daily., Disp: , Rfl:    denosumab (PROLIA) 60 MG/ML SOSY injection, Inject 60 mg into the skin every 6 (six) months., Disp: , Rfl:    fluticasone (FLONASE) 50 MCG/ACT nasal spray, Place 1 spray into both nostrils daily., Disp: , Rfl:    Fluticasone-Umeclidin-Vilant (TRELEGY ELLIPTA) 100-62.5-25 MCG/ACT AEPB, Inhale 1 puff into the lungs daily., Disp: 60 each, Rfl: 3   hydrocortisone cream 1 %,  Apply 1 Application topically 2 (two) times daily as needed for itching (rash)., Disp: , Rfl:    ibuprofen (ADVIL) 200 MG tablet, Take 200 mg by mouth every 6 (six) hours as  needed for moderate pain., Disp: , Rfl:    IGLUCOSE TEST STRIPS test strip, See admin instructions., Disp: , Rfl:    L-Lysine 500 MG CAPS, Take 500 mg by mouth daily., Disp: , Rfl:    Lancets (STERILANCE TL) MISC, See admin instructions., Disp: , Rfl:    losartan (COZAAR) 50 MG tablet, TAKE 1 TABLET(50 MG) BY MOUTH TWICE DAILY (Patient taking differently: Take 100 mg by mouth daily.), Disp: 180 tablet, Rfl: 0   Melatonin 3 MG CAPS, Take 3 mg by mouth at bedtime., Disp: , Rfl:    metFORMIN (GLUCOPHAGE-XR) 500 MG 24 hr tablet, Take 1,000 mg by mouth daily after supper., Disp: , Rfl:    Multiple Vitamins-Minerals (CENTRUM SILVER PO), Take 1 tablet by mouth daily.  Women 50+, Disp: , Rfl:    omeprazole (PRILOSEC) 20 MG capsule, Take 20 mg by mouth every other day. , Disp: , Rfl:    OXYGEN, Inhale 2 L into the lungs daily as needed (SOB)., Disp: , Rfl:    polyethylene glycol (MIRALAX / GLYCOLAX) 17 g packet, Take 17 g by mouth daily as needed for moderate constipation., Disp: , Rfl:    RSV vaccine recomb adjuvanted (AREXVY) 120 MCG/0.5ML injection, Inject into the muscle., Disp: 1 each, Rfl: 0   traMADol (ULTRAM) 50 MG tablet, Take 1 tablet (50 mg total) by mouth every 6 (six) hours as needed., Disp: 20 tablet, Rfl: 0   triamcinolone cream (KENALOG) 0.5 %, Apply 1 Application topically daily as needed (cracked skin)., Disp: , Rfl:    valACYclovir (VALTREX) 500 MG tablet, Take 500 mg by mouth 2 (two) times daily as needed (outbreaks). For 3 days, Disp: , Rfl:    rosuvastatin (CRESTOR) 10 MG tablet, Take 1 tablet (10 mg total) by mouth at bedtime., Disp: 90 tablet, Rfl: 0  Consent:   NA  Disposition:   1 year follow up  Patient may be asked to follow-up sooner based on the results of the above-mentioned testing.  Her questions and concerns were addressed to her satisfaction. She voices understanding of the recommendations provided during this encounter.    Signed, Tessa Lerner, DO, [facility]  [facility]  [address] [place], [place] [address] 09/25/2023 12:48 PM

## 2023-09-25 NOTE — Therapy (Signed)
OUTPATIENT PHYSICAL THERAPY SOZO SCREENING NOTE   Patient Name: Yolanda Morris MRN: [id number] DOB:1946-01-03, 77 y.o., female Today's Date: 09/25/2023  PCP: Merri Brunette, MD REFERRING PROVIDER: Rachel Moulds, MD   PT End of Session - 09/25/23 1517     Visit Number 13   # unchanged due to screen only   PT Start Time 1515    PT Stop Time 1520    PT Time Calculation (min) 5 min    Activity Tolerance Patient tolerated treatment well    Behavior During Therapy WFL for tasks assessed/performed             Past Medical History:  Diagnosis Date   Cancer (HCC)    skin   COPD (chronic obstructive pulmonary disease) (HCC)    DM (diabetes mellitus) (HCC)    Dyspnea    increased activity    Family history of adverse reaction to anesthesia    Family history of ovarian cancer 02/03/2022   Family history of stomach cancer 02/03/2022   Genital herpes    GERD (gastroesophageal reflux disease)    Hyperlipidemia    Migraines    Osteoporosis    Pneumonia    Unspecified essential hypertension    Past Surgical History:  Procedure Laterality Date   BREAST BIOPSY     BREAST EXCISIONAL BIOPSY     over 20 years ago- benign   BREAST LUMPECTOMY WITH RADIOACTIVE SEED AND SENTINEL LYMPH NODE BIOPSY Right 02/16/2022   Procedure: RIGHT BREAST LUMPECTOMY WITH RADIOACTIVE SEED AND SENTINEL LYMPH NODE BIOPSY;  Surgeon: Abigail Miyamoto, MD;  Location: [facility] OR;  Service: General;  Laterality: Right;   BUNIONECTOMY Left 2004   and hammer toe repair Left foot   COLON SURGERY     removal of polyps   COLONOSCOPY WITH PROPOFOL N/A 12/20/2022   Procedure: COLONOSCOPY WITH PROPOFOL;  Surgeon: Kerin Salen, MD;  Location: [facility];  Service: Gastroenterology;  Laterality: N/A;   DENTAL SURGERY     GAS INSERTION Right 01/06/2023   Procedure: INSERTION OF GAS C3F8;  Surgeon: Edmon Crape, MD;  Location: [facility] OR;  Service: Ophthalmology;  Laterality: Right;   POLYPECTOMY  12/20/2022   Procedure:  POLYPECTOMY;  Surgeon: Kerin Salen, MD;  Location: [facility];  Service: Gastroenterology;;  polypectomy with cold forceps   RETINAL TEAR REPAIR CRYOTHERAPY     SCLERAL BUCKLE WITH CRYO Right 01/06/2023   Procedure: SCLERAL BUCKLE WITH CRYO PEXY;  Surgeon: Edmon Crape, MD;  Location: [facility] OR;  Service: Ophthalmology;  Laterality: Right;   Patient Active Problem List   Diagnosis Date Noted   Genetic testing 02/09/2022   Family history of ovarian cancer 02/03/2022   Family history of stomach cancer 02/03/2022   Malignant neoplasm of upper-outer quadrant of right breast in female, estrogen receptor positive (HCC) 01/28/2022   Medication management 07/21/2020   Healthcare maintenance 07/21/2020   Body aches 07/22/2019   Chronic respiratory failure with hypoxia (HCC) 06/17/2019   Allergic rhinitis 11/13/2018   Shortness of breath 10/23/2018   COPD exacerbation (HCC) 01/07/2015   Cough 07/05/2013   Acute sinusitis 02/12/2013   Acute bronchitis 07/08/2011   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) 01/05/2011    REFERRING DIAG: right breast cancer at risk for lymphedema  THERAPY DIAG: Aftercare following surgery for neoplasm  PERTINENT HISTORY: Patient was diagnosed on 12/27/2021 with right grade 2 invasive ductal carcinoma breast cancer. It measures 8 mm and is located in the upper outer quadrant. It is  weakly ER positive, PR negative, and HER2 negative with a Ki67 of 20%. She has stage 4 COPD and is on 2 liters of oxygen. Post Rt breast lumpectomy with no carcinoma in 4 removed nodes on 02/16/22. Will be having radiation.   PRECAUTIONS: right UE Lymphedema risk, None  SUBJECTIVE: Pt returns for her 3 month L-Dex screen.   PAIN:  Are you having pain? No  SOZO SCREENING: Patient was assessed today using the SOZO machine to determine the lymphedema index score. This was compared to her baseline score. It was determined that she is within the recommended range when compared to her  baseline and no further action is needed at this time. She will continue SOZO screenings. These are done every 3 months for 2 years post operatively followed by every 6 months for 2 years, and then annually.   L-DEX FLOWSHEETS - 09/25/23 1500       L-DEX LYMPHEDEMA SCREENING   Measurement Type Unilateral    L-DEX MEASUREMENT EXTREMITY Upper Extremity    POSITION  Standing    DOMINANT SIDE Right    At Risk Side Right    BASELINE SCORE (UNILATERAL) -9    L-DEX SCORE (UNILATERAL) -11.8    VALUE CHANGE (UNILAT) -2.8                [name], PTA 09/25/2023, 3:19 PM

## 2023-09-28 DIAGNOSIS — Z6827 Body mass index (BMI) 27.0-27.9, adult: Secondary | ICD-10-CM | POA: Diagnosis not present

## 2023-09-28 DIAGNOSIS — J069 Acute upper respiratory infection, unspecified: Secondary | ICD-10-CM | POA: Diagnosis not present

## 2023-09-28 DIAGNOSIS — R059 Cough, unspecified: Secondary | ICD-10-CM | POA: Diagnosis not present

## 2023-09-28 DIAGNOSIS — R053 Chronic cough: Secondary | ICD-10-CM | POA: Diagnosis not present

## 2023-10-02 ENCOUNTER — Encounter: Payer: Self-pay | Admitting: Adult Health

## 2023-10-02 ENCOUNTER — Ambulatory Visit: Payer: Medicare Other | Admitting: Adult Health

## 2023-10-02 ENCOUNTER — Ambulatory Visit (INDEPENDENT_AMBULATORY_CARE_PROVIDER_SITE_OTHER): Payer: Medicare Other

## 2023-10-02 VITALS — BP 126/74 | HR 106 | Temp 99.0°F | Ht 62.0 in | Wt 147.8 lb

## 2023-10-02 DIAGNOSIS — Z9981 Dependence on supplemental oxygen: Secondary | ICD-10-CM

## 2023-10-02 DIAGNOSIS — J441 Chronic obstructive pulmonary disease with (acute) exacerbation: Secondary | ICD-10-CM | POA: Diagnosis not present

## 2023-10-02 DIAGNOSIS — J309 Allergic rhinitis, unspecified: Secondary | ICD-10-CM

## 2023-10-02 DIAGNOSIS — R918 Other nonspecific abnormal finding of lung field: Secondary | ICD-10-CM | POA: Diagnosis not present

## 2023-10-02 DIAGNOSIS — J9610 Chronic respiratory failure, unspecified whether with hypoxia or hypercapnia: Secondary | ICD-10-CM

## 2023-10-02 DIAGNOSIS — J069 Acute upper respiratory infection, unspecified: Secondary | ICD-10-CM

## 2023-10-02 DIAGNOSIS — J9611 Chronic respiratory failure with hypoxia: Secondary | ICD-10-CM

## 2023-10-02 DIAGNOSIS — R0602 Shortness of breath: Secondary | ICD-10-CM | POA: Diagnosis not present

## 2023-10-02 MED ORDER — AMOXICILLIN-POT CLAVULANATE 875-125 MG PO TABS: 1.0000 | ORAL_TABLET | Freq: Two times a day (BID) | ORAL | 0 refills | Status: DC

## 2023-10-02 MED ORDER — PREDNISONE 10 MG PO TABS: ORAL_TABLET | ORAL | 0 refills | Status: DC

## 2023-10-02 NOTE — Progress Notes (Signed)
@Patient  ID: Yolanda Morris, female    DOB: May 09, 1946, 77 y.o.   MRN: [id number]  Chief Complaint  Patient presents with   Acute Visit   Discussed the use of AI scribe software for clinical note transcription with the patient, who gave verbal consent to proceed  Referring provider: Merri Brunette, MD  HPI: 77 year old female former smoker followed for COPD and chronic respiratory failure on oxygen Medical history significant for breast cancer status postlumpectomy with radiation (July 2023)  TEST/EVENTS :   10/02/2023 Acute OV ; COPD, O2 RF Patient presents for an acute office visit.  The patient, with a history of COPD and Respiratory failure on on home oxygen therapy 2l/m with activity and At bedtime She , presents with a five-day history of feeling unwell. The illness began with a sore throat and ear discomfort, which typically precedes her illnesses, and progressed to a cough. The patient sought care at an [facility] walk in clinic where they were tested for RSV, COVID, and influenza, all of which were negative. At that time, the patient was not febrile and the cough was not as severe as it is currently.  The patient reports that their oxygen saturation has been dropping more frequently with this illness, even reaching as low as 84. She is suppose to be on Oxygen with activity and At bedtime  but her nose is stopped up and draining making it hard to tolerate O2. They have been struggling to use their oxygen therapy due to the significant sinus drainage they are experiencing. The patient also reports increased coughing and sneezing, and has been producing discolored sputum. They have also had a low-grade fever.  The patient has been managing their symptoms with Mucinex PM, cetirizine, and Flonase.  The patient has been trying to rest and stay hydrated, and has been using Tylenol for fever and discomfort. They report that their ribs have started to hurt due to the severity of their coughing  and sneezing.     She remains on Trelegy inhaler daily.  No known sick contact.  No recent travel.  Appetite is okay with no nausea vomiting or diarrhea  Allergies  Allergen Reactions   Telithromycin     GI Upset    Bacitracin Rash    Scarring     Levofloxacin Rash    Immunization History  Administered Date(s) Administered   Fluad Quad(high Dose 65+) 06/17/2019, 06/30/2021   Influenza Split 06/18/2011, 07/10/2012, 07/30/2013, 08/17/2013, 07/15/2014, 06/18/2015, 07/05/2016, 07/21/2020   Influenza, High Dose Seasonal PF 06/09/2011, 07/10/2012, 07/12/2017, 07/11/2018, 07/21/2020   Influenza,inj,Quad PF,6+ Mos 07/07/2015, 07/11/2016   Influenza-Unspecified 07/16/2014, 07/16/2018   Moderna SARS-COV2 Booster Vaccination 03/09/2021   Moderna Sars-Covid-2 Vaccination 03/09/2021   PFIZER(Purple Top)SARS-COV-2 Vaccination 11/21/2019, 12/17/2019, 08/04/2020   Pfizer Covid-19 Vaccine Bivalent Booster 47yrs & up 07/20/2021, 04/14/2022   Pneumococcal Conjugate-13 12/18/2013   Pneumococcal Polysaccharide-23 07/10/2012   Respiratory Syncytial Virus Vaccine,Recomb Aduvanted(Arexvy) 07/12/2022   Tdap 07/10/2012, 03/22/2022   Zoster Recombinant(Shingrix) 12/16/2018, 04/16/2019   Zoster, Live 04/24/2008, 12/16/2018, 04/16/2019    Past Medical History:  Diagnosis Date   Cancer (HCC)    skin   COPD (chronic obstructive pulmonary disease) (HCC)    DM (diabetes mellitus) (HCC)    Dyspnea    increased activity    Family history of adverse reaction to anesthesia    Family history of ovarian cancer 02/03/2022   Family history of stomach cancer 02/03/2022   Genital herpes    GERD (gastroesophageal reflux disease)  Hyperlipidemia    Migraines    Osteoporosis    Pneumonia    Unspecified essential hypertension     Tobacco History: Social History   Tobacco Use  Smoking Status Former   Current packs/day: 0.00   Average packs/day: 2.0 packs/day for 25.0 years (50.0 ttl pk-yrs)   Types:  Cigarettes   Start date: 10/18/1971   Quit date: 10/17/1996   Years since quitting: 26.9  Smokeless Tobacco Never   Counseling given: Not Answered   Outpatient Medications Prior to Visit  Medication Sig Dispense Refill   acetaminophen (TYLENOL) 325 MG tablet Take 650 mg by mouth every 6 (six) hours as needed for moderate pain.     albuterol (PROVENTIL) (2.5 MG/3ML) 0.083% nebulizer solution Take 3 mLs (2.5 mg total) by nebulization every 6 (six) hours as needed for wheezing or shortness of breath. (Patient taking differently: Take 2.5 mg by nebulization daily.) 360 mL 12   albuterol (VENTOLIN HFA) 108 (90 Base) MCG/ACT inhaler Inhale 2 puffs into the lungs every 6 (six) hours as needed for wheezing or shortness of breath. Fill upon patient request 3 each 3   aspirin EC 81 MG EC tablet Take 81 mg by mouth 2 (two) times a week. Tues and Sat     b complex vitamins capsule Take 1 capsule by mouth daily.     CALCIUM CITRATE PO Take 600 mg by mouth every other day.     cetirizine (ZYRTEC) 10 MG tablet Take 10 mg by mouth daily.     Cholecalciferol (VITAMIN D3) 1000 UNITS CAPS Take 1,000 Units by mouth daily.     COVID-19 mRNA bivalent vaccine, Pfizer, (PFIZER COVID-19 VAC BIVALENT) injection Inject into the muscle. 0.3 mL 0   Cranberry 500 MG TABS Take 500 mg by mouth daily.     denosumab (PROLIA) 60 MG/ML SOSY injection Inject 60 mg into the skin every 6 (six) months.     fluticasone (FLONASE) 50 MCG/ACT nasal spray Place 1 spray into both nostrils daily.     Fluticasone-Umeclidin-Vilant (TRELEGY ELLIPTA) 100-62.5-25 MCG/ACT AEPB Inhale 1 puff into the lungs daily. 60 each 3   hydrocortisone cream 1 % Apply 1 Application topically 2 (two) times daily as needed for itching (rash).     ibuprofen (ADVIL) 200 MG tablet Take 200 mg by mouth every 6 (six) hours as needed for moderate pain.     IGLUCOSE TEST STRIPS test strip See admin instructions.     L-Lysine 500 MG CAPS Take 500 mg by mouth daily.      Lancets (STERILANCE TL) MISC See admin instructions.     losartan (COZAAR) 50 MG tablet TAKE 1 TABLET(50 MG) BY MOUTH TWICE DAILY (Patient taking differently: Take 100 mg by mouth daily.) 180 tablet 0   Melatonin 3 MG CAPS Take 3 mg by mouth at bedtime.     metFORMIN (GLUCOPHAGE-XR) 500 MG 24 hr tablet Take 1,000 mg by mouth daily after supper.     Multiple Vitamins-Minerals (CENTRUM SILVER PO) Take 1 tablet by mouth daily.  Women 50+     omeprazole (PRILOSEC) 20 MG capsule Take 20 mg by mouth every other day.      OXYGEN Inhale 2 L into the lungs daily as needed (SOB).     polyethylene glycol (MIRALAX / GLYCOLAX) 17 g packet Take 17 g by mouth daily as needed for moderate constipation.     RSV vaccine recomb adjuvanted (AREXVY) 120 MCG/0.5ML injection Inject into the muscle. 1 each 0  traMADol (ULTRAM) 50 MG tablet Take 1 tablet (50 mg total) by mouth every 6 (six) hours as needed. 20 tablet 0   triamcinolone cream (KENALOG) 0.5 % Apply 1 Application topically daily as needed (cracked skin).     valACYclovir (VALTREX) 500 MG tablet Take 500 mg by mouth 2 (two) times daily as needed (outbreaks). For 3 days     rosuvastatin (CRESTOR) 10 MG tablet Take 1 tablet (10 mg total) by mouth at bedtime. 90 tablet 0   No facility-administered medications prior to visit.     Review of Systems:   Constitutional:   No  weight loss, night sweats,  Fevers, chills, fatigue, or  lassitude.  HEENT:   No headaches,  Difficulty swallowing,  Tooth/dental problems, or  Sore throat,                No sneezing, itching, ear ache,  +nasal congestion, post nasal drip,   CV:  No chest pain,  Orthopnea, PND, swelling in lower extremities, anasarca, dizziness, palpitations, syncope.   GI  No heartburn, indigestion, abdominal pain, nausea, vomiting, diarrhea, change in bowel habits, loss of appetite, bloody stools.   Resp: .  No chest wall deformity  Skin: no rash or lesions.  GU: no dysuria, change in color  of urine, no urgency or frequency.  No flank pain, no hematuria   MS:  No joint pain or swelling.  No decreased range of motion.  No back pain.    Physical Exam  BP 126/74 (BP Location: Left Arm, Patient Position: Sitting, Cuff Size: Small)   Pulse (!) 106   Temp 99 F (37.2 C) (Oral)   Ht 5\' 2"  (1.575 m)   Wt 147 lb 12.8 oz (67 kg)   SpO2 97%   BMI 27.03 kg/m   GEN: A/Ox3; pleasant , NAD, well nourished    HEENT:  [place]/AT,   NOSE-clear, THROAT-clear, no lesions, no postnasal drip or exudate noted.   NECK:  Supple w/ fair ROM; no JVD; normal carotid impulses w/o bruits; no thyromegaly or nodules palpated; no lymphadenopathy.    RESP  Clear  P & A; w/o, wheezes/ rales/ or rhonchi. no accessory muscle use, no dullness to percussion  CARD:  RRR, no m/r/g, no peripheral edema, pulses intact, no cyanosis or clubbing.  GI:   Soft & nt; nml bowel sounds; no organomegaly or masses detected.   Musco: Warm bil, no deformities or joint swelling noted.   Neuro: alert, no focal deficits noted.    Skin: Warm, no lesions or rashes    Lab Results:  CBC   BNP No results found for: "BNP"  ProBNP   Imaging: No results found.  Administration History     None          Latest Ref Rng & Units 09/09/2019    9:02 AM  PFT Results  FVC-Pre L 1.69   FVC-Predicted Pre % 63   FVC-Post L 1.77   FVC-Predicted Post % 66   Pre FEV1/FVC % % 58   Post FEV1/FCV % % 57   FEV1-Pre L 0.99   FEV1-Predicted Pre % 49   FEV1-Post L 1.00   DLCO uncorrected ml/min/mmHg 16.62   DLCO UNC% % 92   DLVA Predicted % 115   TLC L 5.98   TLC % Predicted % 125   RV % Predicted % 185     No results found for: "NITRICOXIDE"      Assessment & Plan:  Assessment and  Plan    Acute Upper Respiratory Infection with Possible Pneumonia They present with a 5-day history of sore throat, cough, and low-grade fever, with initial tests for COVID-19, RSV, and influenza swabs returning negative at  urgent care. Their cough has increased with discolored sputum and oxygen saturation levels have worsened, especially with activity. Given these symptoms and their COPD, there is concern for possible pneumonia. We discussed the risks of untreated pneumonia, including potential worsening of their respiratory status and the possibility of hospitalization. We will order a chest x-ray, prescribe Augmentin and a prednisone taper, and advise the use of Mucinex DM. We recommend saline nasal spray before Flonase, encourage the use of oxygen during activity and at night, and advise hot showers and steam inhalation for symptom relief.   Chronic Obstructive Pulmonary Disease (COPD) Exacerbation  Will treat with a prednisone taper.  Continue Trelegy daily  Use albuterol inhaler and neb As needed. .   Chronic respiratory failure continue on oxygen to maintain O2 saturations greater than 88 to 90%.  Encouraged on oxygen compliance.  Allergic Rhinitis-flare  They use cetirizine (Zyrtec) and Flonase, report sinus drainage, and have been using Mucinex DM for symptom relief. They will continue cetirizine daily and Flonase with proper administration technique, use saline nasal spray before Flonase, and are advised hot showers and steam inhalation for symptom relief.  General Health Maintenance We advised maintaining hydration, rest, and using Tylenol for fever and discomfort.  Follow-up They are to follow up with Dr. Celine Mans in February, contact the clinic if symptoms worsen or do not improve,         Rubye Oaks, NP 10/02/2023

## 2023-10-02 NOTE — Patient Instructions (Addendum)
Augmentin 875mg  Twice daily  for 1 week, take with food.  Mucinex DM Twice daily  As needed  cough/congestion  Prednisone taper over next week.  Saline nasal rinses As needed   Flonase nasal spray daily  Zyrtec daily  Chest xray today  Oxygen 2l/m with activity and At bedtime  (goal is to have O2 sats >88-90%)  Continue on Trelegy 1 puff daily, rinse after use.  Albuterol inhaler or neb As needed   Follow up with Dr. Celine Mans in 2 months and As needed   Please contact office for sooner follow up if symptoms do not improve or worsen or seek emergency care

## 2023-10-03 ENCOUNTER — Ambulatory Visit: Payer: Medicare Other | Admitting: Internal Medicine

## 2023-10-20 ENCOUNTER — Ambulatory Visit (HOSPITAL_COMMUNITY)
Admission: RE | Admit: 2023-10-20 | Discharge: 2023-10-20 | Disposition: A | Discharge status: Home / Self Care | Payer: Medicare Other | Source: Ambulatory Visit | Attending: Cardiology | Admitting: Cardiology

## 2023-10-20 DIAGNOSIS — I6523 Occlusion and stenosis of bilateral carotid arteries: Secondary | ICD-10-CM | POA: Insufficient documentation

## 2023-10-24 DIAGNOSIS — D0511 Intraductal carcinoma in situ of right breast: Secondary | ICD-10-CM | POA: Diagnosis not present

## 2023-10-24 DIAGNOSIS — E119 Type 2 diabetes mellitus without complications: Secondary | ICD-10-CM | POA: Diagnosis not present

## 2023-10-24 DIAGNOSIS — M810 Age-related osteoporosis without current pathological fracture: Secondary | ICD-10-CM | POA: Diagnosis not present

## 2023-10-24 DIAGNOSIS — J9611 Chronic respiratory failure with hypoxia: Secondary | ICD-10-CM | POA: Diagnosis not present

## 2023-10-24 DIAGNOSIS — E785 Hyperlipidemia, unspecified: Secondary | ICD-10-CM | POA: Diagnosis not present

## 2023-10-24 DIAGNOSIS — Z9981 Dependence on supplemental oxygen: Secondary | ICD-10-CM | POA: Diagnosis not present

## 2023-10-24 DIAGNOSIS — J449 Chronic obstructive pulmonary disease, unspecified: Secondary | ICD-10-CM | POA: Diagnosis not present

## 2023-10-24 DIAGNOSIS — I10 Essential (primary) hypertension: Secondary | ICD-10-CM | POA: Diagnosis not present

## 2023-10-24 DIAGNOSIS — Z23 Encounter for immunization: Secondary | ICD-10-CM | POA: Diagnosis not present

## 2023-10-24 DIAGNOSIS — B009 Herpesviral infection, unspecified: Secondary | ICD-10-CM | POA: Diagnosis not present

## 2023-10-30 ENCOUNTER — Other Ambulatory Visit: Payer: Self-pay | Admitting: Family Medicine

## 2023-10-30 DIAGNOSIS — H401133 Primary open-angle glaucoma, bilateral, severe stage: Secondary | ICD-10-CM | POA: Diagnosis not present

## 2023-10-30 DIAGNOSIS — H33301 Unspecified retinal break, right eye: Secondary | ICD-10-CM | POA: Diagnosis not present

## 2023-10-30 DIAGNOSIS — H33001 Unspecified retinal detachment with retinal break, right eye: Secondary | ICD-10-CM | POA: Diagnosis not present

## 2023-10-30 DIAGNOSIS — H2512 Age-related nuclear cataract, left eye: Secondary | ICD-10-CM | POA: Diagnosis not present

## 2023-10-30 DIAGNOSIS — M810 Age-related osteoporosis without current pathological fracture: Secondary | ICD-10-CM

## 2023-10-30 DIAGNOSIS — Z961 Presence of intraocular lens: Secondary | ICD-10-CM | POA: Diagnosis not present

## 2023-10-30 DIAGNOSIS — E119 Type 2 diabetes mellitus without complications: Secondary | ICD-10-CM | POA: Diagnosis not present

## 2023-11-01 ENCOUNTER — Encounter: Payer: Self-pay | Admitting: Cardiology

## 2023-11-02 NOTE — Telephone Encounter (Signed)
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  Ideally she would benefit from aspirin but given her risk of osteoporosis and falls we will hold aspirin for now.  Timi Reeser Chantilly, DO, [facility]

## 2023-11-02 NOTE — Progress Notes (Signed)
Please see the telephone encounter from 11/02/2023.  Yolanda Morris Tedrow, DO, [facility]

## 2023-11-06 ENCOUNTER — Ambulatory Visit
Admission: RE | Admit: 2023-11-06 | Discharge: 2023-11-06 | Disposition: A | Discharge status: Home / Self Care | Payer: Medicare Other | Source: Ambulatory Visit | Attending: Family Medicine | Admitting: Family Medicine

## 2023-11-06 DIAGNOSIS — E2839 Other primary ovarian failure: Secondary | ICD-10-CM | POA: Diagnosis not present

## 2023-11-06 DIAGNOSIS — M810 Age-related osteoporosis without current pathological fracture: Secondary | ICD-10-CM

## 2023-11-06 DIAGNOSIS — J449 Chronic obstructive pulmonary disease, unspecified: Secondary | ICD-10-CM | POA: Diagnosis not present

## 2023-11-06 DIAGNOSIS — N958 Other specified menopausal and perimenopausal disorders: Secondary | ICD-10-CM | POA: Diagnosis not present

## 2023-11-06 DIAGNOSIS — M8588 Other specified disorders of bone density and structure, other site: Secondary | ICD-10-CM | POA: Diagnosis not present

## 2023-11-13 ENCOUNTER — Other Ambulatory Visit: Payer: Self-pay | Admitting: Primary Care

## 2023-11-17 DIAGNOSIS — M810 Age-related osteoporosis without current pathological fracture: Secondary | ICD-10-CM | POA: Diagnosis not present

## 2023-11-17 DIAGNOSIS — Z8781 Personal history of (healed) traumatic fracture: Secondary | ICD-10-CM | POA: Diagnosis not present

## 2023-12-04 ENCOUNTER — Ambulatory Visit (INDEPENDENT_AMBULATORY_CARE_PROVIDER_SITE_OTHER): Payer: Medicare Other | Admitting: Internal Medicine

## 2023-12-04 ENCOUNTER — Encounter: Payer: Self-pay | Admitting: Internal Medicine

## 2023-12-04 VITALS — BP 158/78 | HR 69 | Ht 62.0 in | Wt 146.0 lb

## 2023-12-04 DIAGNOSIS — J4489 Other specified chronic obstructive pulmonary disease: Secondary | ICD-10-CM

## 2023-12-04 DIAGNOSIS — J439 Emphysema, unspecified: Secondary | ICD-10-CM

## 2023-12-04 DIAGNOSIS — J9611 Chronic respiratory failure with hypoxia: Secondary | ICD-10-CM

## 2023-12-04 DIAGNOSIS — H26493 Other secondary cataract, bilateral: Secondary | ICD-10-CM

## 2023-12-04 DIAGNOSIS — K219 Gastro-esophageal reflux disease without esophagitis: Secondary | ICD-10-CM | POA: Diagnosis not present

## 2023-12-04 NOTE — Patient Instructions (Addendum)
It was a pleasure to see you today!  Please schedule follow up scheduled with myself in 6 months.  If my schedule is not open yet, we will contact you with a reminder closer to that time. Please call [phone] if you haven't heard from [place] a month before, and always call us sooner if issues or concerns arise. You can also send [place] a message through MyChart, but but aware that this is not to be used for urgent issues and it may take up to 5-7 days to receive a reply. Please be aware that you will likely be able to view your results before I have a chance to respond to them. Please give [place] 5 business days to respond to any non-urgent results.   Continue trelegy 1 puff once a day with albuterol nebulizer or inhaler as needed.  Continue wearing oxygen to keep saturations over 90%.  Continue pantoprazole for the reflux Call me if you need me sooner.

## 2023-12-04 NOTE — Progress Notes (Signed)
Yolanda Morris    [id number]    [birth year]  Primary Care Physician:Smith, Sonny Masters, MD Date of Appointment: 12/04/2023 Established Patient Visit  Chief complaint:   Chief Complaint  Patient presents with   Follow-up     HPI: Yolanda Morris is a 78 y.o. woman with history COPD and COVID infection. On home oxygen 2LNC.  Lives at home with her dog. Husband passed away in 01-06-18 from cancer She has had lumpectomy and adjuvant radiation for breast cancer in 01-06-2022. She has done pulmonary rehabilitation twice and really enjoyed it.  DME [employer]   Interval Updates: Here for follow up for COPD.  She had a copd exacerbation following emergency surgery for retinal detachment and and another exacerbation in December 2024. Felt better immediately after starting prednisone.   Still on trelegy with [employer] assistance. Taking albuterol as needed.  Feeling good today.   I have reviewed the patient's family social and past medical history and updated as appropriate.   Past Medical History:  Diagnosis Date   Cancer (HCC)    skin   COPD (chronic obstructive pulmonary disease) (HCC)    DM (diabetes mellitus) (HCC)    Dyspnea    increased activity    Family history of adverse reaction to anesthesia    Family history of ovarian cancer 02/03/2022   Family history of stomach cancer 02/03/2022   Genital herpes    GERD (gastroesophageal reflux disease)    Hyperlipidemia    Migraines    Osteoporosis    Pneumonia    Unspecified essential hypertension     Past Surgical History:  Procedure Laterality Date   BREAST BIOPSY     BREAST EXCISIONAL BIOPSY     over 20 years ago- benign   BREAST LUMPECTOMY WITH RADIOACTIVE SEED AND SENTINEL LYMPH NODE BIOPSY Right 02/16/2022   Procedure: RIGHT BREAST LUMPECTOMY WITH RADIOACTIVE SEED AND SENTINEL LYMPH NODE BIOPSY;  Surgeon: Abigail Miyamoto, MD;  Location: [facility] OR;  Service: General;  Laterality: Right;   BUNIONECTOMY Left 01-06-2003   and hammer  toe repair Left foot   COLON SURGERY     removal of polyps   COLONOSCOPY WITH PROPOFOL N/A 12/20/2022   Procedure: COLONOSCOPY WITH PROPOFOL;  Surgeon: Kerin Salen, MD;  Location: [facility]COPY;  Service: Gastroenterology;  Laterality: N/A;   DENTAL SURGERY     GAS INSERTION Right 01/06/2023   Procedure: INSERTION OF GAS C3F8;  Surgeon: Edmon Crape, MD;  Location: [facility] OR;  Service: Ophthalmology;  Laterality: Right;   POLYPECTOMY  12/20/2022   Procedure: POLYPECTOMY;  Surgeon: Kerin Salen, MD;  Location: [facility]COPY;  Service: Gastroenterology;;  polypectomy with cold forceps   RETINAL TEAR REPAIR CRYOTHERAPY     SCLERAL BUCKLE WITH CRYO Right 01/06/2023   Procedure: SCLERAL BUCKLE WITH CRYO PEXY;  Surgeon: Edmon Crape, MD;  Location: [facility] OR;  Service: Ophthalmology;  Laterality: Right;    Family History  Problem Relation Age of Onset   COPD Mother        deceased   Heart disease Mother    Alcohol abuse Father    Alcohol abuse Sister        deceased   Heart attack Sister    Alcohol abuse Brother        deceased   Cancer Maternal Aunt        unknown type; d. 65   Cancer Maternal Uncle        unknown  type; d. 37s   Ovarian cancer Paternal Aunt        d. 95   Stomach cancer Paternal Aunt 68   Breast cancer Neg Hx     Social History   Occupational History   Occupation: Marketing executive  Tobacco Use   Smoking status: Former    Current packs/day: 0.00    Average packs/day: 2.0 packs/day for 25.0 years (50.0 ttl pk-yrs)    Types: Cigarettes    Start date: 10/18/1971    Quit date: 10/17/1996    Years since quitting: 27.1   Smokeless tobacco: Never  Vaping Use   Vaping status: Never Used  Substance and Sexual Activity   Alcohol use: No    Alcohol/week: 0.0 standard drinks of alcohol   Drug use: No   Sexual activity: Not Currently    Birth control/protection: Post-menopausal     Physical Exam: Blood pressure (!) 158/78, pulse 69, height 5\' 2"  (1.575 m), weight  146 lb (66.2 kg), SpO2 93%. Gen:NAD CV: RRR no mrg Resp: diminished    Data Reviewed: Imaging: I have personally reviewed the chest xray October 2021 which shows hyperinflation   PFTs:      Latest Ref Rng & Units 09/09/2019    9:02 AM  PFT Results  FVC-Pre L 1.69   FVC-Predicted Pre % 63   FVC-Post L 1.77   FVC-Predicted Post % 66   Pre FEV1/FVC % % 58   Post FEV1/FCV % % 57   FEV1-Pre L 0.99   FEV1-Predicted Pre % 49   FEV1-Post L 1.00   DLCO uncorrected ml/min/mmHg 16.62   DLCO UNC% % 92   DLVA Predicted % 115   TLC L 5.98   TLC % Predicted % 125   RV % Predicted % 185    I have personally reviewed the patient's PFTs and they show severe airflow limitation.   Labs: Lab Results  Component Value Date   WBC 8.9 01/06/2023   HGB 14.6 01/06/2023   HCT 42.9 01/06/2023   MCV 90.1 01/06/2023   PLT 265 01/06/2023     Immunization status: Immunization History  Administered Date(s) Administered   Fluad Quad(high Dose 65+) 06/17/2019, 06/30/2021   Influenza Split 06/18/2011, 07/10/2012, 07/30/2013, 08/17/2013, 07/15/2014, 06/18/2015, 07/05/2016, 07/21/2020   Influenza, High Dose Seasonal PF 06/09/2011, 07/10/2012, 07/12/2017, 07/11/2018, 07/21/2020   Influenza,inj,Quad PF,6+ Mos 07/07/2015, 07/11/2016   Influenza-Unspecified 07/16/2014, 07/16/2018   Moderna SARS-COV2 Booster Vaccination 03/09/2021   Moderna Sars-Covid-2 Vaccination 03/09/2021   PFIZER(Purple Top)SARS-COV-2 Vaccination 11/21/2019, 12/17/2019, 08/04/2020   Pfizer Covid-19 Vaccine Bivalent Booster 67yrs & up 07/20/2021, 04/14/2022   Pneumococcal Conjugate-13 12/18/2013   Pneumococcal Polysaccharide-23 07/10/2012   Respiratory Syncytial Virus Vaccine,Recomb Aduvanted(Arexvy) 07/12/2022   Tdap 07/10/2012, 03/22/2022   Zoster Recombinant(Shingrix) 12/16/2018, 04/16/2019   Zoster, Live 04/24/2008, 12/16/2018, 04/16/2019    Assessment:  COPD with severe airflow limitation FEV1 44% of  predicted Chronic respiratory failure on 2LNC History of tobacco use disorder quit 1998 Breast Cancer - s/p lumpectomy and radiation July 2023  Plan/Recommendations:  Continue trelegy 1 puff once a day with albuterol nebulizer or inhaler as needed.  Continue wearing oxygen to keep saturations over 90%.  Continue pantoprazole for the reflux Call me if you need me sooner.   Return to Care: Return in about 6 months (around 06/02/2024).   Durel Salts, MD Pulmonary and Critical Care Medicine [facility] Office:[phone]

## 2023-12-06 ENCOUNTER — Ambulatory Visit: Payer: Medicare Other | Admitting: Internal Medicine

## 2023-12-28 ENCOUNTER — Telehealth: Payer: Self-pay | Admitting: Primary Care

## 2023-12-29 NOTE — Telephone Encounter (Signed)
 Fax confirmation received 12/29/23

## 2024-01-01 ENCOUNTER — Ambulatory Visit
Admission: RE | Admit: 2024-01-01 | Discharge: 2024-01-01 | Disposition: A | Discharge status: Home / Self Care | Payer: Medicare Other | Source: Ambulatory Visit | Attending: Hematology and Oncology | Admitting: Hematology and Oncology

## 2024-01-01 DIAGNOSIS — Z853 Personal history of malignant neoplasm of breast: Secondary | ICD-10-CM | POA: Diagnosis not present

## 2024-01-01 DIAGNOSIS — C50411 Malignant neoplasm of upper-outer quadrant of right female breast: Secondary | ICD-10-CM

## 2024-01-01 DIAGNOSIS — Z08 Encounter for follow-up examination after completed treatment for malignant neoplasm: Secondary | ICD-10-CM | POA: Diagnosis not present

## 2024-01-05 ENCOUNTER — Telehealth: Payer: Self-pay

## 2024-01-05 NOTE — Telephone Encounter (Signed)
 Left message on patient voicemail for appointment on 01/08/24

## 2024-01-08 ENCOUNTER — Other Ambulatory Visit: Payer: Self-pay | Admitting: *Deleted

## 2024-01-08 ENCOUNTER — Inpatient Hospital Stay: Attending: Hematology and Oncology | Admitting: Hematology and Oncology

## 2024-01-08 ENCOUNTER — Inpatient Hospital Stay

## 2024-01-08 ENCOUNTER — Ambulatory Visit: Payer: Medicare Other

## 2024-01-08 ENCOUNTER — Inpatient Hospital Stay: Payer: Medicare Other | Admitting: Hematology and Oncology

## 2024-01-08 VITALS — BP 140/52 | HR 81 | Temp 97.9°F | Resp 17 | Wt 145.3 lb

## 2024-01-08 DIAGNOSIS — C50411 Malignant neoplasm of upper-outer quadrant of right female breast: Secondary | ICD-10-CM | POA: Insufficient documentation

## 2024-01-08 DIAGNOSIS — I10 Essential (primary) hypertension: Secondary | ICD-10-CM | POA: Diagnosis not present

## 2024-01-08 DIAGNOSIS — Z1732 Human epidermal growth factor receptor 2 negative status: Secondary | ICD-10-CM | POA: Diagnosis not present

## 2024-01-08 DIAGNOSIS — J449 Chronic obstructive pulmonary disease, unspecified: Secondary | ICD-10-CM | POA: Diagnosis not present

## 2024-01-08 DIAGNOSIS — Z87891 Personal history of nicotine dependence: Secondary | ICD-10-CM | POA: Diagnosis not present

## 2024-01-08 DIAGNOSIS — Z170 Estrogen receptor positive status [ER+]: Secondary | ICD-10-CM | POA: Diagnosis not present

## 2024-01-08 DIAGNOSIS — E119 Type 2 diabetes mellitus without complications: Secondary | ICD-10-CM | POA: Diagnosis not present

## 2024-01-08 DIAGNOSIS — E1169 Type 2 diabetes mellitus with other specified complication: Secondary | ICD-10-CM | POA: Diagnosis not present

## 2024-01-08 DIAGNOSIS — Z171 Estrogen receptor negative status [ER-]: Secondary | ICD-10-CM | POA: Diagnosis not present

## 2024-01-08 NOTE — Assessment & Plan Note (Addendum)
 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  Given the very small size of the tumor, we have recommended considering surgery upfront followed by consideration for adjuvant chemotherapy based on the final pathology.   She is s/p right breast lumpectomy, final pathology showed 8 mm invasive ductal carcinoma, grade 2, triple negative, Ki67 of 15% Given final pathology showing tumor of 8 mm, we have discussed about considering adjuvant chemotherapy.  We have discussed about docetaxel and cyclophosphamide every 21 days for 4 cycles.  She understands that the role of chemotherapy is to reduce the risk of distant metastasis as well as local recurrence. She declined adjuvant chemo.  She completed adjuvant radiation and is now here for follow-up.   Breast cancer follow-up Recent mammogram negative for cancer. Dense breast tissue complicates detection. Interested in [place] reveal for MRD testing. - Provide information and paperwork for new blood test. - Perform blood test if consented.  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 Left eye managed with laser surgery to preserve vision. - Continue B complex vitamin as advised. - Monitor intraocular pressure in both eyes. - FU with ophthalmology as recommended.  Hypertension On losartan, adjusted to morning dosing for potential eye pressure benefit. - Continue losartan 100 mg in the morning. - Monitor blood pressure regularly.  Rash on breasts Rash likely from compression garment, causing itchiness. - Apply hydrocortisone cream to alleviate itching.  Fall risk Lives alone, concerned about falling. Obtained fall detection necklace. - Encourage wearing fall detection necklace   Health monitoring program Participating in  program with regular check-ins and dietary guidance. - Participate in health monitoring program. - Use blood pressure machine to track blood pressure. -RTC in 6 months.

## 2024-01-08 NOTE — Progress Notes (Signed)
 [facility] CONSULT NOTE  Patient Care Team: Merri Brunette, MD as PCP - General (Family Medicine) Tessa Lerner, DO as PCP - Cardiology (Cardiology) Abigail Miyamoto, MD as Consulting Physician (General Surgery) Rachel Moulds, MD as Consulting Physician (Hematology and Oncology) Lonie Peak, MD as Attending Physician (Radiation Oncology) Talmage Coin, MD as Consulting Physician (Endocrinology) Tessa Lerner, DO as Consulting Physician (Cardiology) Charlott Holler, MD as Consulting Physician (Pulmonary Disease)  CHIEF COMPLAINTS/PURPOSE OF CONSULTATION:  Newly diagnosed breast cancer  HISTORY OF PRESENTING ILLNESS:  Yolanda Morris 78 y.o. female is here because of recent diagnosis of right breast IDC  I reviewed her records extensively and collaborated the history with the patient.  SUMMARY OF ONCOLOGIC HISTORY: Oncology History  Malignant neoplasm of upper-outer quadrant of right breast in female, estrogen receptor positive (HCC)  12/27/2021 Mammogram   Screening mammogram showed a right breast asymmetry warranting further investigation. Diagnostic mammogram showed 6 x 7 x 8 mm irregular hypoechoic right breast mass at 10 o'clock position 5 cm from the nipple.  No right axillary adenopathy   01/24/2022 Pathology Results   Pathology results showed right breast invasive ductal carcinoma, grade 2.  Prognostic showed ER 30%, positive weak staining intensity.  PR 0%, negative.  Ki-67 of 20%.  Tumor cells are negative for HER2 1+    01/28/2022 Initial Diagnosis   Malignant neoplasm of upper-outer quadrant of right breast in female, estrogen receptor positive (HCC)   02/02/2022 Cancer Staging   Staging form: Breast, AJCC 8th Edition - Pathologic: Stage IB (pT1b, pN0, cM0, G2, ER-, PR-, HER2-) - Signed by Rachel Moulds, MD on 03/01/2022 Histologic grading system: 3 grade system   02/08/2022 Genetic Testing   Negative hereditary cancer genetic testing: no pathogenic  variants detected in [facility] BRCAPlus Panel and [employer] CustomNext-Cancer +RNAinsight.  Report dates are February 08, 2022 and February 13, 2022.   The BRCAplus panel offered by [employer] and includes sequencing and deletion/duplication analysis for the following 8 genes: ATM, BRCA1, BRCA2, CDH1, CHEK2, PALB2, PTEN, and TP53.  The CustomNext-Cancer+RNAinsight panel offered by [employer] includes sequencing and rearrangement analysis for the following 47 genes:  APC, ATM, AXIN2, BARD1, BMPR1A, BRCA1, BRCA2, BRIP1, CDH1, CDK4, CDKN2A, CHEK2, DICER1, EPCAM, GREM1, HOXB13, MEN1, MLH1, MSH2, MSH3, MSH6, MUTYH, NBN, NF1, NF2, NTHL1, PALB2, PMS2, POLD1, POLE, PTEN, RAD51C, RAD51D, RECQL, RET, SDHA, SDHAF2, SDHB, SDHC, SDHD, SMAD4, SMARCA4, STK11, TP53, TSC1, TSC2, and VHL.  RNA data is routinely analyzed for use in variant interpretation for all genes.   02/16/2022 Surgery   Patient had right breast lumpectomy, deep sentinel lymph node biopsy on 02/16/2022.  Pathology showed invasive ductal carcinoma, 0.8 cm in maximal dimension, and 0.4 cm of the closest resection margin, DCIS coming to less than 0.1 cm of the closest resection margin.  No metastatic carcinoma in 4 out of 4 lymph nodes.  Prior prognostic showed ER 30% positive weak staining, PR 0% negative.  Repeat prognostics from final pathology showed ER negative, PR negative, KI of 15% and HER2 negative   03/31/2022 - 04/28/2022 Radiation Therapy   Site Technique Total Dose (Gy) Dose per Fx (Gy) Completed Fx Beam Energies  Breast, Right: Breast_R 3D 40.05/40.05 2.67 15/15 10XFFF  Breast, Right: Breast_R_Bst 3D 10/10 2 5/5 6X, 10X      History of Present Illness     Discussed the use of AI scribe software for clinical note transcription with the patient, who gave verbal consent to proceed.  History of Present  Illness         Yolanda Morris is a 78 year old female with breast cancer, glaucoma and retinal detachment who presents for follow-up on her eye  conditions and medication management.  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 Laser surgery was performed on the left eye to manage intraocular pressure, and further evaluation for the right eye is pending. Occasional blurriness affects her ability to drive at night, on highways, or in heavy rain. She continues to take a B complex vitamin.  Her losartan intake has been adjusted to 100 mg in the morning instead of splitting the dose between morning and night. She also changed her calcium citrate intake, now taking it daily instead of every other day.  She experiences itching and rash on both breasts, attributed to wearing a compression garment. Despite discontinuing its use, symptoms persist. She inquires about using hydrocortisone cream to alleviate the itching.  A recent mammogram on January 01, 2024, showed no signs of cancer, but she received a letter indicating dense breast tissue. She is interested in a blood test that can detect cancer in the blood.  She discusses a new health program which includes receiving a blood pressure machine to help manage her health. She also mentions using a fall detection necklace at home due to living alone. Rest of the pertinent 10 point ROS reviewed and negative  MEDICAL HISTORY:  Past Medical History:  Diagnosis Date   Cancer (HCC)    skin   COPD (chronic obstructive pulmonary disease) (HCC)    DM (diabetes mellitus) (HCC)    Dyspnea    increased activity    Family history of adverse reaction to anesthesia    Family history of ovarian cancer 02/03/2022   Family history of stomach cancer 02/03/2022   Genital herpes    GERD (gastroesophageal reflux disease)    Hyperlipidemia    Migraines    Osteoporosis    Pneumonia    Unspecified essential hypertension     SURGICAL HISTORY: Past Surgical History:  Procedure Laterality Date   BREAST BIOPSY     BREAST EXCISIONAL BIOPSY     over 20 years ago- benign   BREAST  LUMPECTOMY WITH RADIOACTIVE SEED AND SENTINEL LYMPH NODE BIOPSY Right 02/16/2022   Procedure: RIGHT BREAST LUMPECTOMY WITH RADIOACTIVE SEED AND SENTINEL LYMPH NODE BIOPSY;  Surgeon: Abigail Miyamoto, MD;  Location: [facility] OR;  Service: General;  Laterality: Right;   BUNIONECTOMY Left 2004   and hammer toe repair Left foot   COLON SURGERY     removal of polyps   COLONOSCOPY WITH PROPOFOL N/A 12/20/2022   Procedure: COLONOSCOPY WITH PROPOFOL;  Surgeon: Kerin Salen, MD;  Location: [facility] ENDOSCOPY;  Service: Gastroenterology;  Laterality: N/A;   DENTAL SURGERY     GAS INSERTION Right 01/06/2023   Procedure: INSERTION OF GAS C3F8;  Surgeon: Edmon Crape, MD;  Location: [facility] OR;  Service: Ophthalmology;  Laterality: Right;   POLYPECTOMY  12/20/2022   Procedure: POLYPECTOMY;  Surgeon: Kerin Salen, MD;  Location: [facility]OSCOPY;  Service: Gastroenterology;;  polypectomy with cold forceps   RETINAL TEAR REPAIR CRYOTHERAPY     SCLERAL BUCKLE WITH CRYO Right 01/06/2023   Procedure: SCLERAL BUCKLE WITH CRYO PEXY;  Surgeon: Edmon Crape, MD;  Location: [facility] OR;  Service: Ophthalmology;  Laterality: Right;    SOCIAL HISTORY: Social History   Socioeconomic History   Marital status: Widowed    Spouse name: Not on file  Number of children: Not on file   Years of education: Not on file   Highest education level: Not on file  Occupational History   Occupation: Marketing executive  Tobacco Use   Smoking status: Former    Current packs/day: 0.00    Average packs/day: 2.0 packs/day for 25.0 years (50.0 ttl pk-yrs)    Types: Cigarettes    Start date: 10/18/1971    Quit date: 10/17/1996    Years since quitting: 27.2   Smokeless tobacco: Never  Vaping Use   Vaping status: Never Used  Substance and Sexual Activity   Alcohol use: No    Alcohol/week: 0.0 standard drinks of alcohol   Drug use: No   Sexual activity: Not Currently    Birth control/protection: Post-menopausal  Other Topics Concern   Not on file   Social History Narrative   No children   Social Drivers of Corporate investment banker Strain: Not on file  Food Insecurity: Not on file  Transportation Needs: Not on file  Physical Activity: Not on file  Stress: Not on file  Social Connections: Not on file  Intimate Partner Violence: Not on file    FAMILY HISTORY: Family History  Problem Relation Age of Onset   COPD Mother        deceased   Heart disease Mother    Alcohol abuse Father    Alcohol abuse Sister        deceased   Heart attack Sister    Alcohol abuse Brother        deceased   Cancer Maternal Aunt        unknown type; d. 27   Cancer Maternal Uncle        unknown type; d. 31s   Ovarian cancer Paternal Aunt        d. 37   Stomach cancer Paternal Aunt 60   Breast cancer Neg Hx     ALLERGIES:  is allergic to telithromycin, bacitracin, and levofloxacin.  MEDICATIONS:  Current Outpatient Medications  Medication Sig Dispense Refill   acetaminophen (TYLENOL) 325 MG tablet Take 650 mg by mouth every 6 (six) hours as needed for moderate pain.     albuterol (PROVENTIL) (2.5 MG/3ML) 0.083% nebulizer solution USE 1 VIAL IN NEBULIZER EVERY 6 HOURS - as needed for wheezing or shortness of breath 120 mL 11   albuterol (VENTOLIN HFA) 108 (90 Base) MCG/ACT inhaler Inhale 2 puffs into the lungs every 6 (six) hours as needed for wheezing or shortness of breath. Fill upon patient request 3 each 3   b complex vitamins capsule Take 1 capsule by mouth daily.     CALCIUM CITRATE PO Take 600 mg by mouth daily.     cetirizine (ZYRTEC) 10 MG tablet Take 10 mg by mouth daily.     Cholecalciferol (VITAMIN D3) 1000 UNITS CAPS Take 1,000 Units by mouth daily.     Cranberry 500 MG TABS Take 500 mg by mouth daily.     denosumab (PROLIA) 60 MG/ML SOSY injection Inject 60 mg into the skin every 6 (six) months.     fluticasone (FLONASE) 50 MCG/ACT nasal spray Place 1 spray into both nostrils daily.     Fluticasone-Umeclidin-Vilant  (TRELEGY ELLIPTA) 100-62.5-25 MCG/ACT AEPB Inhale 1 puff into the lungs daily. 60 each 3   hydrocortisone cream 1 % Apply 1 Application topically 2 (two) times daily as needed for itching (rash).     ibuprofen (ADVIL) 200 MG tablet Take 200 mg by mouth  every 6 (six) hours as needed for moderate pain.     IGLUCOSE TEST STRIPS test strip See admin instructions.     L-Lysine 500 MG CAPS Take 500 mg by mouth daily.     Lancets (STERILANCE TL) MISC See admin instructions.     losartan (COZAAR) 50 MG tablet TAKE 1 TABLET(50 MG) BY MOUTH TWICE DAILY (Patient taking differently: Take 100 mg by mouth daily.) 180 tablet 0   Melatonin 3 MG CAPS Take 3 mg by mouth at bedtime.     metFORMIN (GLUCOPHAGE-XR) 500 MG 24 hr tablet Take 1,000 mg by mouth daily after supper.     Multiple Vitamins-Minerals (CENTRUM SILVER PO) Take 1 tablet by mouth daily.  Women 50+     omeprazole (PRILOSEC) 20 MG capsule Take 20 mg by mouth every other day.      OXYGEN Inhale 2 L into the lungs daily as needed (SOB).     polyethylene glycol (MIRALAX / GLYCOLAX) 17 g packet Take 17 g by mouth daily as needed for moderate constipation.     rosuvastatin (CRESTOR) 10 MG tablet Take 1 tablet (10 mg total) by mouth at bedtime. 90 tablet 0   RSV vaccine recomb adjuvanted (AREXVY) 120 MCG/0.5ML injection Inject into the muscle. 1 each 0   traMADol (ULTRAM) 50 MG tablet Take 1 tablet (50 mg total) by mouth every 6 (six) hours as needed. 20 tablet 0   triamcinolone cream (KENALOG) 0.5 % Apply 1 Application topically daily as needed (cracked skin).     valACYclovir (VALTREX) 500 MG tablet Take 500 mg by mouth 2 (two) times daily as needed (outbreaks). For 3 days     No current facility-administered medications for this visit.    REVIEW OF SYSTEMS:   Constitutional: Denies fevers, chills or abnormal night sweats Eyes: Denies blurriness of vision, double vision or watery eyes Ears, nose, mouth, throat, and face: Denies mucositis or sore  throat Respiratory: Denies cough, dyspnea or wheezes Cardiovascular: Denies palpitation, chest discomfort or lower extremity swelling Gastrointestinal:  Denies nausea, heartburn or change in bowel habits Skin: Denies abnormal skin rashes Lymphatics: Denies new lymphadenopathy or easy bruising Neurological:Denies numbness, tingling or new weaknesses Behavioral/Psych: Mood is stable, no new changes  Breast: Denies any palpable lumps or discharge All other systems were reviewed with the patient and are negative.  PHYSICAL EXAMINATION: ECOG PERFORMANCE STATUS: 1 - Symptomatic but completely ambulatory  Vitals:   01/08/24 1245  BP: (!) 140/52  Pulse: 81  Resp: 17  Temp: 97.9 F (36.6 C)  SpO2: 93%    Filed Weights   01/08/24 1245  Weight: 145 lb 4.8 oz (65.9 kg)   Physical Exam Constitutional:      Appearance: Normal appearance.  Chest:     Comments: Bilateral breasts inspected.  Scattered density noted.  Postsurgical changes.  Otherwise no palpable masses or regional adenopathy. Musculoskeletal:        General: Normal range of motion.     Cervical back: Normal range of motion and neck supple. No rigidity.     Comments: Varicose veins  Lymphadenopathy:     Cervical: No cervical adenopathy.  Neurological:     Mental Status: She is alert.      LABORATORY DATA:  I have reviewed the data as listed Lab Results  Component Value Date   WBC 8.9 01/06/2023   HGB 14.6 01/06/2023   HCT 42.9 01/06/2023   MCV 90.1 01/06/2023   PLT 265 01/06/2023   Lab Results  Component Value Date   NA 137 01/06/2023   K 4.0 01/06/2023   CL 100 01/06/2023   CO2 23 01/06/2023    RADIOGRAPHIC STUDIES: I have personally reviewed the radiological reports and agreed with the findings in the report.  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  Given the very small size of the tumor, we have recommended considering surgery upfront followed by consideration for adjuvant chemotherapy based on the final pathology.   She is s/p right breast lumpectomy, final pathology showed 8 mm invasive ductal carcinoma, grade 2, triple negative, Ki67 of 15% Given final pathology showing tumor of 8 mm, we have discussed about considering adjuvant chemotherapy.  We have discussed about docetaxel and cyclophosphamide every 21 days for 4 cycles.  She understands that the role of chemotherapy is to reduce the risk of distant metastasis as well as local recurrence. She declined adjuvant chemo.  She completed adjuvant radiation and is now here for follow-up.   Breast cancer follow-up Recent mammogram negative for cancer. Dense breast tissue complicates detection. Interested in [facility] reveal for MRD testing. - Provide information and paperwork for new blood test. - Perform blood test if consented.  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 Left eye managed with laser surgery to preserve vision. - Continue B complex vitamin as advised. - Monitor intraocular pressure in both eyes. - FU with ophthalmology as recommended.  Hypertension On losartan, adjusted to morning dosing for potential eye pressure benefit. - Continue losartan 100 mg in the morning. - Monitor blood pressure regularly.  Rash on breasts Rash likely from compression garment, causing itchiness. - Apply hydrocortisone cream to alleviate itching.  Fall risk Lives alone, concerned about falling. Obtained fall detection necklace. - Encourage wearing fall detection necklace   Health monitoring program Participating in program with regular check-ins and dietary guidance. -  Participate in health monitoring program. - Use blood pressure machine to track blood pressure. -RTC in 6 months.      Rachel Moulds, MD 01/08/24

## 2024-01-11 ENCOUNTER — Encounter: Payer: Self-pay | Admitting: Internal Medicine

## 2024-01-12 ENCOUNTER — Ambulatory Visit: Admitting: Hematology and Oncology

## 2024-01-15 DIAGNOSIS — E119 Type 2 diabetes mellitus without complications: Secondary | ICD-10-CM | POA: Diagnosis not present

## 2024-01-15 DIAGNOSIS — I10 Essential (primary) hypertension: Secondary | ICD-10-CM | POA: Diagnosis not present

## 2024-01-15 DIAGNOSIS — E785 Hyperlipidemia, unspecified: Secondary | ICD-10-CM | POA: Diagnosis not present

## 2024-01-15 DIAGNOSIS — J449 Chronic obstructive pulmonary disease, unspecified: Secondary | ICD-10-CM | POA: Diagnosis not present

## 2024-01-15 DIAGNOSIS — E1169 Type 2 diabetes mellitus with other specified complication: Secondary | ICD-10-CM | POA: Diagnosis not present

## 2024-01-16 ENCOUNTER — Other Ambulatory Visit: Payer: Self-pay | Admitting: Internal Medicine

## 2024-01-16 DIAGNOSIS — L821 Other seborrheic keratosis: Secondary | ICD-10-CM | POA: Diagnosis not present

## 2024-01-16 DIAGNOSIS — L300 Nummular dermatitis: Secondary | ICD-10-CM | POA: Diagnosis not present

## 2024-01-16 DIAGNOSIS — L570 Actinic keratosis: Secondary | ICD-10-CM | POA: Diagnosis not present

## 2024-01-16 DIAGNOSIS — L814 Other melanin hyperpigmentation: Secondary | ICD-10-CM | POA: Diagnosis not present

## 2024-01-16 DIAGNOSIS — C44212 Basal cell carcinoma of skin of right ear and external auricular canal: Secondary | ICD-10-CM | POA: Diagnosis not present

## 2024-01-16 DIAGNOSIS — D2271 Melanocytic nevi of right lower limb, including hip: Secondary | ICD-10-CM | POA: Diagnosis not present

## 2024-01-16 MED ORDER — TRELEGY ELLIPTA 100-62.5-25 MCG/ACT IN AEPB: 1.0000 | INHALATION_SPRAY | Freq: Every day | RESPIRATORY_TRACT | 3 refills | Status: DC

## 2024-01-16 NOTE — Telephone Encounter (Signed)
 Copied from CRM [id number]. Topic: Clinical - Medication Refill >> Jan 16, 2024  3:11 PM Para March B wrote: Most Recent Primary Care Visit:   Medication: Fluticasone-Umeclidin-Vilant (TRELEGY ELLIPTA) 100-62.5-25 MCG/ACT AEPB  Has the patient contacted their pharmacy? Yes (Agent: If no, request that the patient contact the pharmacy for the refill. If patient does not wish to contact the pharmacy document the reason why and proceed with request.) (Agent: If yes, when and what did the pharmacy advise?)  Is this the correct pharmacy for this prescription? Yes If no, delete pharmacy and type the correct one.  This is the patient's preferred pharmacy:  [id number] - [place], [place] - [address] [place] [address] Phone: [phone] Fax: [phone]    Has the prescription been filled recently? No  Is the patient out of the medication? No  Has the patient been seen for an appointment in the last year OR does the patient have an upcoming appointment? Yes  Can we respond through MyChart? Yes  Agent: Please be advised that Rx refills may take up to 3 business days. We ask that you follow-up with your pharmacy.

## 2024-01-18 ENCOUNTER — Telehealth: Payer: Self-pay

## 2024-01-18 ENCOUNTER — Other Ambulatory Visit: Payer: Self-pay | Admitting: Family Medicine

## 2024-01-18 MED ORDER — TRELEGY ELLIPTA 100-62.5-25 MCG/ACT IN AEPB: 1.0000 | INHALATION_SPRAY | Freq: Every day | RESPIRATORY_TRACT | 3 refills | Status: DC

## 2024-01-18 NOTE — Telephone Encounter (Signed)
 Copied from CRM [id number]. Topic: Clinical - Medication Refill >> Jan 17, 2024 11:41 AM Renie Ora wrote: Most Recent Primary Care Visit:   Medication: (TRELEGY ELLIPTA) 100-62.5-25 MCG/ACT AEP  Has the patient contacted their pharmacy? Yes (Agent: If no, request that the patient contact the pharmacy for the refill. If patient does not wish to contact the pharmacy document the reason why and proceed with request.) (Agent: If yes, when and what did the pharmacy advise?)  Is this the correct pharmacy for this prescription? Yes If no, delete pharmacy and type the correct one.  This is the patient's preferred pharmacy:  [id number] - [place], [place] - [address] [place] [address] Phone: [phone] Fax: [phone]  [facility] - [place], [place] - [address] [employer]. [address] [place] [address] Phone: [phone] Fax: [phone]   Has the prescription been filled recently? Yes  Is the patient out of the medication? No, patient has two days left.  Has the patient been seen for an appointment in the last year OR does the patient have an upcoming appointment? Yes  Can we respond through MyChart? Yes  Agent: Please be advised that Rx refills may take up to 3 business days. We ask that you follow-up with your pharmacy.

## 2024-01-18 NOTE — Addendum Note (Signed)
 Addended by: Joylene Draft R on: 01/18/2024 11:13 AM   Modules accepted: Orders

## 2024-01-18 NOTE — Telephone Encounter (Signed)
 Rx corrected and sent Pt is aware of rx being sent

## 2024-01-18 NOTE — Telephone Encounter (Unsigned)
 Copied from CRM [id number]. Topic: Clinical - Prescription Issue >> Jan 18, 2024 10:20 AM Payton Doughty wrote: Reason for CRM: pt has been looking for her tr: Fluticasone-Umeclidin-Vilant (TRELEGY ELLIPTA) 100-62.5-25 MCG/ACT AEPBelegy since 04/01. The Rx states it was "printed".  Pt has 2 days left and is VERY concerned she will run.  She would appreciate a call back or mychart message when this is resent.  [facility]

## 2024-01-18 NOTE — Telephone Encounter (Signed)
Refill has been sent to Pharmacy.

## 2024-01-18 NOTE — Telephone Encounter (Signed)
 Copied from CRM [id number]. Topic: Clinical - Medical Advice >> Jan 17, 2024 11:44 AM Renie Ora wrote: Reason for CRM: Patient called and stated [facility] recommended she get a covid-19 shot and she wondered if she should get another covid-19 shot being that it has been over a year.   Spoke w/ pt let her know it was okay since its been over a year and that the pharmacy staff was letting her know she was due for it

## 2024-01-18 NOTE — Telephone Encounter (Signed)
 Called pt per MD to advise [facility] Reve[facility] testing was negative/not detected. Pt verbalized understanding of results and knows [facility] will be in touch to schedule 6 mo repeat lab.

## 2024-01-25 ENCOUNTER — Encounter: Payer: Self-pay | Admitting: Dermatology

## 2024-01-29 DIAGNOSIS — Z23 Encounter for immunization: Secondary | ICD-10-CM | POA: Diagnosis not present

## 2024-02-01 ENCOUNTER — Encounter: Payer: Self-pay | Admitting: Hematology and Oncology

## 2024-02-01 LAB — GUARDANT REVEAL

## 2024-02-06 DIAGNOSIS — J449 Chronic obstructive pulmonary disease, unspecified: Secondary | ICD-10-CM | POA: Diagnosis not present

## 2024-02-06 DIAGNOSIS — E119 Type 2 diabetes mellitus without complications: Secondary | ICD-10-CM | POA: Diagnosis not present

## 2024-02-06 DIAGNOSIS — E1169 Type 2 diabetes mellitus with other specified complication: Secondary | ICD-10-CM | POA: Diagnosis not present

## 2024-02-06 DIAGNOSIS — I10 Essential (primary) hypertension: Secondary | ICD-10-CM | POA: Diagnosis not present

## 2024-02-12 ENCOUNTER — Ambulatory Visit: Attending: Hematology and Oncology

## 2024-02-12 VITALS — Wt 146.2 lb

## 2024-02-12 DIAGNOSIS — Z483 Aftercare following surgery for neoplasm: Secondary | ICD-10-CM | POA: Insufficient documentation

## 2024-02-12 NOTE — Therapy (Signed)
 OUTPATIENT PHYSICAL THERAPY SOZO SCREENING NOTE   Patient Name: Yolanda Morris MRN: [id number] DOB:05-Apr-1946, 78 y.o., female Today's Date: 02/12/2024  PCP: Faustina Hood, MD REFERRING PROVIDER: Murleen Arms, MD   PT End of Session - 02/12/24 1529     Visit Number 13   # unchanged due to screen only   PT Start Time 1527    PT Stop Time 1531    PT Time Calculation (min) 4 min    Activity Tolerance Patient tolerated treatment well    Behavior During Therapy WFL for tasks assessed/performed             Past Medical History:  Diagnosis Date   Cancer (HCC)    skin   COPD (chronic obstructive pulmonary disease) (HCC)    DM (diabetes mellitus) (HCC)    Dyspnea    increased activity    Family history of adverse reaction to anesthesia    Family history of ovarian cancer 02/03/2022   Family history of stomach cancer 02/03/2022   Genital herpes    GERD (gastroesophageal reflux disease)    Hyperlipidemia    Migraines    Osteoporosis    Pneumonia    Unspecified essential hypertension    Past Surgical History:  Procedure Laterality Date   BREAST BIOPSY     BREAST EXCISIONAL BIOPSY     over 20 years ago- benign   BREAST LUMPECTOMY WITH RADIOACTIVE SEED AND SENTINEL LYMPH NODE BIOPSY Right 02/16/2022   Procedure: RIGHT BREAST LUMPECTOMY WITH RADIOACTIVE SEED AND SENTINEL LYMPH NODE BIOPSY;  Surgeon: Oza Blumenthal, MD;  Location: [facility] OR;  Service: General;  Laterality: Right;   BUNIONECTOMY Left 2004   and hammer toe repair Left foot   COLON SURGERY     removal of polyps   COLONOSCOPY WITH PROPOFOL  N/A 12/20/2022   Procedure: COLONOSCOPY WITH PROPOFOL ;  Surgeon: Genell Ken, MD;  Location: [facility];  Service: Gastroenterology;  Laterality: N/A;   DENTAL SURGERY     GAS INSERTION Right 01/06/2023   Procedure: INSERTION OF GAS C3F8;  Surgeon: Shon Downing, MD;  Location: [facility] OR;  Service: Ophthalmology;  Laterality: Right;   POLYPECTOMY  12/20/2022   Procedure:  POLYPECTOMY;  Surgeon: Genell Ken, MD;  Location: [facility];  Service: Gastroenterology;;  polypectomy with cold forceps   RETINAL TEAR REPAIR CRYOTHERAPY     SCLERAL BUCKLE WITH CRYO Right 01/06/2023   Procedure: SCLERAL BUCKLE WITH CRYO PEXY;  Surgeon: Shon Downing, MD;  Location: [facility] OR;  Service: Ophthalmology;  Laterality: Right;   Patient Active Problem List   Diagnosis Date Noted   Genetic testing 02/09/2022   Family history of ovarian cancer 02/03/2022   Family history of stomach cancer 02/03/2022   Malignant neoplasm of upper-outer quadrant of right breast in female, estrogen receptor positive (HCC) 01/28/2022   Medication management 07/21/2020   Healthcare maintenance 07/21/2020   Body aches 07/22/2019   Chronic respiratory failure with hypoxia (HCC) 06/17/2019   Allergic rhinitis 11/13/2018   Shortness of breath 10/23/2018   COPD exacerbation (HCC) 01/07/2015   Cough 07/05/2013   Acute sinusitis 02/12/2013   Acute bronchitis 07/08/2011   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) 01/05/2011    REFERRING DIAG: right breast cancer at risk for lymphedema  THERAPY DIAG: Aftercare following surgery for neoplasm  PERTINENT HISTORY: Patient was diagnosed on 12/27/2021 with right grade 2 invasive ductal carcinoma breast cancer. It measures 8 mm and is located in the upper outer quadrant. It is  weakly ER positive, PR negative, and HER2 negative with a Ki67 of 20%. She has stage 4 COPD and is on 2 liters of oxygen . Post Rt breast lumpectomy with no carcinoma in 4 removed nodes on 02/16/22. Will be having radiation.   PRECAUTIONS: right UE Lymphedema risk, None  SUBJECTIVE: Pt returns for her last 3 month L-Dex screen.   PAIN:  Are you having pain? No  SOZO SCREENING: Patient was assessed today using the SOZO machine to determine the lymphedema index score. This was compared to her baseline score. It was determined that she is within the recommended range when compared to her  baseline and no further action is needed at this time. She will continue SOZO screenings. These are done every 3 months for 2 years post operatively followed by every 6 months for 2 years, and then annually.   L-DEX FLOWSHEETS - 02/12/24 1500       L-DEX LYMPHEDEMA SCREENING   Measurement Type Unilateral    L-DEX MEASUREMENT EXTREMITY Upper Extremity    POSITION  Standing    DOMINANT SIDE Right    At Risk Side Right    BASELINE SCORE (UNILATERAL) -9    L-DEX SCORE (UNILATERAL) -11.2    VALUE CHANGE (UNILAT) -2.2                Denyce Flank, PTA 02/12/2024, 3:30 PM

## 2024-02-13 DIAGNOSIS — H2512 Age-related nuclear cataract, left eye: Secondary | ICD-10-CM | POA: Diagnosis not present

## 2024-02-13 DIAGNOSIS — H2511 Age-related nuclear cataract, right eye: Secondary | ICD-10-CM | POA: Diagnosis not present

## 2024-02-13 DIAGNOSIS — H3341 Traction detachment of retina, right eye: Secondary | ICD-10-CM | POA: Diagnosis not present

## 2024-02-13 DIAGNOSIS — H33021 Retinal detachment with multiple breaks, right eye: Secondary | ICD-10-CM | POA: Diagnosis not present

## 2024-02-14 DIAGNOSIS — E785 Hyperlipidemia, unspecified: Secondary | ICD-10-CM | POA: Diagnosis not present

## 2024-02-14 DIAGNOSIS — Z85828 Personal history of other malignant neoplasm of skin: Secondary | ICD-10-CM | POA: Diagnosis not present

## 2024-02-14 DIAGNOSIS — J449 Chronic obstructive pulmonary disease, unspecified: Secondary | ICD-10-CM | POA: Diagnosis not present

## 2024-02-14 DIAGNOSIS — C44212 Basal cell carcinoma of skin of right ear and external auricular canal: Secondary | ICD-10-CM | POA: Diagnosis not present

## 2024-02-14 DIAGNOSIS — I10 Essential (primary) hypertension: Secondary | ICD-10-CM | POA: Diagnosis not present

## 2024-02-14 DIAGNOSIS — E1169 Type 2 diabetes mellitus with other specified complication: Secondary | ICD-10-CM | POA: Diagnosis not present

## 2024-02-14 DIAGNOSIS — E119 Type 2 diabetes mellitus without complications: Secondary | ICD-10-CM | POA: Diagnosis not present

## 2024-02-27 ENCOUNTER — Telehealth: Payer: Self-pay | Admitting: Primary Care

## 2024-02-27 NOTE — Telephone Encounter (Signed)
 Rc'd fax from [facility] for Filter for compressor. Will fwd to Ms. Sueanne Emerald for [employer].

## 2024-02-29 NOTE — Telephone Encounter (Signed)
 x

## 2024-03-01 DIAGNOSIS — Z961 Presence of intraocular lens: Secondary | ICD-10-CM | POA: Diagnosis not present

## 2024-03-01 DIAGNOSIS — H2512 Age-related nuclear cataract, left eye: Secondary | ICD-10-CM | POA: Diagnosis not present

## 2024-03-01 DIAGNOSIS — E119 Type 2 diabetes mellitus without complications: Secondary | ICD-10-CM | POA: Diagnosis not present

## 2024-03-01 DIAGNOSIS — H33301 Unspecified retinal break, right eye: Secondary | ICD-10-CM | POA: Diagnosis not present

## 2024-03-01 DIAGNOSIS — H401133 Primary open-angle glaucoma, bilateral, severe stage: Secondary | ICD-10-CM | POA: Diagnosis not present

## 2024-03-07 DIAGNOSIS — E1169 Type 2 diabetes mellitus with other specified complication: Secondary | ICD-10-CM | POA: Diagnosis not present

## 2024-03-07 DIAGNOSIS — E119 Type 2 diabetes mellitus without complications: Secondary | ICD-10-CM | POA: Diagnosis not present

## 2024-03-07 DIAGNOSIS — I10 Essential (primary) hypertension: Secondary | ICD-10-CM | POA: Diagnosis not present

## 2024-03-07 DIAGNOSIS — J449 Chronic obstructive pulmonary disease, unspecified: Secondary | ICD-10-CM | POA: Diagnosis not present

## 2024-03-16 DIAGNOSIS — E119 Type 2 diabetes mellitus without complications: Secondary | ICD-10-CM | POA: Diagnosis not present

## 2024-03-16 DIAGNOSIS — I10 Essential (primary) hypertension: Secondary | ICD-10-CM | POA: Diagnosis not present

## 2024-03-16 DIAGNOSIS — J449 Chronic obstructive pulmonary disease, unspecified: Secondary | ICD-10-CM | POA: Diagnosis not present

## 2024-03-16 DIAGNOSIS — E785 Hyperlipidemia, unspecified: Secondary | ICD-10-CM | POA: Diagnosis not present

## 2024-03-16 DIAGNOSIS — E1169 Type 2 diabetes mellitus with other specified complication: Secondary | ICD-10-CM | POA: Diagnosis not present

## 2024-04-06 DIAGNOSIS — E119 Type 2 diabetes mellitus without complications: Secondary | ICD-10-CM | POA: Diagnosis not present

## 2024-04-06 DIAGNOSIS — E1169 Type 2 diabetes mellitus with other specified complication: Secondary | ICD-10-CM | POA: Diagnosis not present

## 2024-04-06 DIAGNOSIS — J449 Chronic obstructive pulmonary disease, unspecified: Secondary | ICD-10-CM | POA: Diagnosis not present

## 2024-04-06 DIAGNOSIS — I10 Essential (primary) hypertension: Secondary | ICD-10-CM | POA: Diagnosis not present

## 2024-04-15 DIAGNOSIS — I10 Essential (primary) hypertension: Secondary | ICD-10-CM | POA: Diagnosis not present

## 2024-04-15 DIAGNOSIS — J449 Chronic obstructive pulmonary disease, unspecified: Secondary | ICD-10-CM | POA: Diagnosis not present

## 2024-04-15 DIAGNOSIS — E119 Type 2 diabetes mellitus without complications: Secondary | ICD-10-CM | POA: Diagnosis not present

## 2024-04-15 DIAGNOSIS — E1169 Type 2 diabetes mellitus with other specified complication: Secondary | ICD-10-CM | POA: Diagnosis not present

## 2024-04-15 DIAGNOSIS — E785 Hyperlipidemia, unspecified: Secondary | ICD-10-CM | POA: Diagnosis not present

## 2024-04-16 DIAGNOSIS — I10 Essential (primary) hypertension: Secondary | ICD-10-CM | POA: Diagnosis not present

## 2024-04-16 DIAGNOSIS — Z1331 Encounter for screening for depression: Secondary | ICD-10-CM | POA: Diagnosis not present

## 2024-04-16 DIAGNOSIS — E1169 Type 2 diabetes mellitus with other specified complication: Secondary | ICD-10-CM | POA: Diagnosis not present

## 2024-04-16 DIAGNOSIS — E119 Type 2 diabetes mellitus without complications: Secondary | ICD-10-CM | POA: Diagnosis not present

## 2024-04-16 DIAGNOSIS — E785 Hyperlipidemia, unspecified: Secondary | ICD-10-CM | POA: Diagnosis not present

## 2024-04-16 DIAGNOSIS — Z853 Personal history of malignant neoplasm of breast: Secondary | ICD-10-CM | POA: Diagnosis not present

## 2024-04-16 DIAGNOSIS — M810 Age-related osteoporosis without current pathological fracture: Secondary | ICD-10-CM | POA: Diagnosis not present

## 2024-04-16 DIAGNOSIS — Z0000 Encounter for general adult medical examination without abnormal findings: Secondary | ICD-10-CM | POA: Diagnosis not present

## 2024-04-16 DIAGNOSIS — J449 Chronic obstructive pulmonary disease, unspecified: Secondary | ICD-10-CM | POA: Diagnosis not present

## 2024-05-06 DIAGNOSIS — J449 Chronic obstructive pulmonary disease, unspecified: Secondary | ICD-10-CM | POA: Diagnosis not present

## 2024-05-06 DIAGNOSIS — I10 Essential (primary) hypertension: Secondary | ICD-10-CM | POA: Diagnosis not present

## 2024-05-06 DIAGNOSIS — E119 Type 2 diabetes mellitus without complications: Secondary | ICD-10-CM | POA: Diagnosis not present

## 2024-05-06 DIAGNOSIS — E1169 Type 2 diabetes mellitus with other specified complication: Secondary | ICD-10-CM | POA: Diagnosis not present

## 2024-05-16 DIAGNOSIS — E119 Type 2 diabetes mellitus without complications: Secondary | ICD-10-CM | POA: Diagnosis not present

## 2024-05-16 DIAGNOSIS — E1169 Type 2 diabetes mellitus with other specified complication: Secondary | ICD-10-CM | POA: Diagnosis not present

## 2024-05-16 DIAGNOSIS — J449 Chronic obstructive pulmonary disease, unspecified: Secondary | ICD-10-CM | POA: Diagnosis not present

## 2024-05-16 DIAGNOSIS — E785 Hyperlipidemia, unspecified: Secondary | ICD-10-CM | POA: Diagnosis not present

## 2024-05-16 DIAGNOSIS — I10 Essential (primary) hypertension: Secondary | ICD-10-CM | POA: Diagnosis not present

## 2024-05-23 DIAGNOSIS — M810 Age-related osteoporosis without current pathological fracture: Secondary | ICD-10-CM | POA: Diagnosis not present

## 2024-05-28 ENCOUNTER — Telehealth: Payer: Self-pay

## 2024-05-28 NOTE — Telephone Encounter (Signed)
 Copied from CRM [id number]. Topic: General - Other >> May 28, 2024 12:11 PM Whitney O wrote: Reason for CRM: christina from [employer] is calling cause he relected her oxygen  equipment and just wanted to know did doctor want fax or parachuate . Relayed fax would be fine . And ms Tawni is sending over the fax today

## 2024-05-28 NOTE — Telephone Encounter (Signed)
 Will forward to the nurses.

## 2024-05-29 NOTE — Telephone Encounter (Signed)
 Noted, and the fax will be addressed once received. Closing encounter.

## 2024-06-03 ENCOUNTER — Ambulatory Visit (INDEPENDENT_AMBULATORY_CARE_PROVIDER_SITE_OTHER): Admitting: Internal Medicine

## 2024-06-03 ENCOUNTER — Encounter: Payer: Self-pay | Admitting: Internal Medicine

## 2024-06-03 VITALS — BP 126/70 | HR 71 | Temp 98.3°F | Ht 62.0 in | Wt 142.0 lb

## 2024-06-03 DIAGNOSIS — J4489 Other specified chronic obstructive pulmonary disease: Secondary | ICD-10-CM

## 2024-06-03 DIAGNOSIS — J9611 Chronic respiratory failure with hypoxia: Secondary | ICD-10-CM | POA: Diagnosis not present

## 2024-06-03 DIAGNOSIS — K219 Gastro-esophageal reflux disease without esophagitis: Secondary | ICD-10-CM | POA: Diagnosis not present

## 2024-06-03 NOTE — Patient Instructions (Addendum)
 It was a pleasure to see you today!  Please schedule follow up with myself in 6 months.  If my schedule is not open yet, we will contact you with a reminder closer to that time. Please call [phone] if you haven't heard from us  a month before, and always call us  sooner if issues or concerns arise. You can also send us  a message through MyChart, but but aware that this is not to be used for urgent issues and it may take up to 5-7 days to receive a reply. Please be aware that you will likely be able to view your results before I have a chance to respond to them. Please give us  5 business days to respond to any non-urgent results.   Continue trelegy 1 puff once a day with albuterol  nebulizer or inhaler as needed.  Continue wearing oxygen  to keep saturations over 90%.  Continue pantoprazole for the reflux Call me if you need me sooner.   Ok to use your portable oxygen  concentrator at night while sleeping for short periods of time while traveling, but I prefer you use the larger concentrator because it is continuous instead of puls.

## 2024-06-03 NOTE — Progress Notes (Signed)
 Yolanda Morris    [id number]    02/28/1946  Primary Care Physician:Smith, [place], MD Date of Appointment: 06/03/2024 Established Patient Visit  Chief complaint:   Chief Complaint  Patient presents with   COPD    Patient states she's still having sob walking.     HPI: Yolanda Morris is a 78 y.o. woman with history COPD and COVID infection. On home oxygen  2LNC with exertion.  Lives at home with her dog. Husband passed away in 2018-06-28 from cancer. She has had lumpectomy and adjuvant radiation for breast cancer in 06-28-2022. She has done pulmonary rehabilitation twice and really enjoyed it.  DME [employer]   Interval Updates: Here for follow up for COPD.  Has had Moh's surgery on her right ear for skin cancer.   Had a few weeks without air conditioning which was difficult on her breathing.   Taking trelegy 1 puff once daily, on financial assistance.  Minimal prn albuterol  use.   Having intermittent palpitations when she lies down but hasn't been diagnosed with A. Fib. EKG in Dec 2024 NSR no ectopic beats. It is not daily. About 1-2 times/week   I have reviewed the patient's family social and past medical history and updated as appropriate.   Past Medical History:  Diagnosis Date   Cancer (HCC)    skin   COPD (chronic obstructive pulmonary disease) (HCC)    DM (diabetes mellitus) (HCC)    Dyspnea    increased activity    Family history of adverse reaction to anesthesia    Family history of ovarian cancer 02/03/2022   Family history of stomach cancer 02/03/2022   Genital herpes    GERD (gastroesophageal reflux disease)    Hyperlipidemia    Migraines    Osteoporosis    Pneumonia    Unspecified essential hypertension     Past Surgical History:  Procedure Laterality Date   BREAST BIOPSY     BREAST EXCISIONAL BIOPSY     over 20 years ago- benign   BREAST LUMPECTOMY WITH RADIOACTIVE SEED AND SENTINEL LYMPH NODE BIOPSY Right 02/16/2022   Procedure: RIGHT BREAST  LUMPECTOMY WITH RADIOACTIVE SEED AND SENTINEL LYMPH NODE BIOPSY;  Surgeon: Vernetta Berg, MD;  Location: [facility] OR;  Service: General;  Laterality: Right;   BUNIONECTOMY Left Jun 29, 2003   and hammer toe repair Left foot   COLON SURGERY     removal of polyps   COLONOSCOPY WITH PROPOFOL  N/A 12/20/2022   Procedure: COLONOSCOPY WITH PROPOFOL ;  Surgeon: Saintclair Jasper, MD;  Location: [facility];  Service: Gastroenterology;  Laterality: N/A;   DENTAL SURGERY     GAS INSERTION Right 01/06/2023   Procedure: INSERTION OF GAS C3F8;  Surgeon: Elner Arley LABOR, MD;  Location: [facility] OR;  Service: Ophthalmology;  Laterality: Right;   POLYPECTOMY  12/20/2022   Procedure: POLYPECTOMY;  Surgeon: Saintclair Jasper, MD;  Location: [facility];  Service: Gastroenterology;;  polypectomy with cold forceps   RETINAL TEAR REPAIR CRYOTHERAPY     SCLERAL BUCKLE WITH CRYO Right 01/06/2023   Procedure: SCLERAL BUCKLE WITH CRYO PEXY;  Surgeon: Elner Arley LABOR, MD;  Location[facility] OR;  Service: Ophthalmology;  Laterality: Right;    Family History  Problem Relation Age of Onset   COPD Mother        deceased   Heart disease Mother    Alcohol abuse Father    Alcohol abuse Sister        deceased   Heart  attack Sister    Alcohol abuse Brother        deceased   Cancer Maternal Aunt        unknown type; d. 34   Cancer Maternal Uncle        unknown type; d. 38s   Ovarian cancer Paternal Aunt        d. 37   Stomach cancer Paternal Aunt 70   Breast cancer Neg Hx     Social History   Occupational History   Occupation: Marketing executive  Tobacco Use   Smoking status: Former    Current packs/day: 0.00    Average packs/day: 2.0 packs/day for 25.0 years (50.0 ttl pk-yrs)    Types: Cigarettes    Start date: 10/18/1971    Quit date: 10/17/1996    Years since quitting: 27.6   Smokeless tobacco: Never  Vaping Use   Vaping status: Never Used  Substance and Sexual Activity   Alcohol use: No    Alcohol/week: 0.0 standard drinks of  alcohol   Drug use: No   Sexual activity: Not Currently    Birth control/protection: Post-menopausal     Physical Exam: Blood pressure 126/70, pulse 71, temperature 98.3 F (36.8 C), temperature source Oral, height 5' 2 (1.575 m), weight 142 lb (64.4 kg), SpO2 92%.  Gen:NAD, kyphosis CV: RRR no mrg Resp: diminished, no wheeze Ext: bilateral varicose veins, no edema  Data Reviewed: Imaging: I have personally reviewed the chest xray Dec 2024 hyperinflation and emphysema  PFTs:      Latest Ref Rng & Units 09/09/2019    9:02 AM  PFT Results  FVC-Pre L 1.69   FVC-Predicted Pre % 63   FVC-Post L 1.77   FVC-Predicted Post % 66   Pre FEV1/FVC % % 58   Post FEV1/FCV % % 57   FEV1-Pre L 0.99   FEV1-Predicted Pre % 49   FEV1-Post L 1.00   DLCO uncorrected ml/min/mmHg 16.62   DLCO UNC% % 92   DLVA Predicted % 115   TLC L 5.98   TLC % Predicted % 125   RV % Predicted % 185    I have personally reviewed the patient's PFTs and they show severe airflow limitation.   Labs: Lab Results  Component Value Date   WBC 8.9 01/06/2023   HGB 14.6 01/06/2023   HCT 42.9 01/06/2023   MCV 90.1 01/06/2023   PLT 265 01/06/2023     Immunization status: Immunization History  Administered Date(s) Administered   Fluad Quad(high Dose 65+) 06/17/2019, 06/30/2021   Fluzone Influenza virus vaccine,trivalent (IIV3), split virus 07/30/2013, 07/15/2014, 06/18/2015, 07/05/2016   Influenza Split 06/18/2011, 07/10/2012, 08/17/2013, 07/21/2020   Influenza, High Dose Seasonal PF 06/09/2011, 07/10/2012, 07/12/2017, 07/11/2018, 07/21/2020   Influenza,inj,Quad PF,6+ Mos 07/07/2015, 07/11/2016   Influenza-Unspecified 07/16/2014, 07/16/2018   Moderna SARS-COV2 Booster Vaccination 03/09/2021   Moderna Sars-Covid-2 Vaccination 03/09/2021   PFIZER(Purple Top)SARS-COV-2 Vaccination 11/21/2019, 12/17/2019, 08/04/2020   Pfizer Covid-19 Vaccine Bivalent Booster 13yrs & up 07/20/2021, 04/14/2022    Pfizer(Comirnaty)Fall Seasonal Vaccine 12 years and older 01/29/2024   Pneumococcal Conjugate-13 12/18/2013   Pneumococcal Polysaccharide-23 07/10/2012   Respiratory Syncytial Virus Vaccine ,Recomb Aduvanted(Arexvy ) 07/12/2022   Tdap 07/10/2012, 03/22/2022   Zoster Recombinant(Shingrix) 12/16/2018, 04/16/2019   Zoster, Live 04/24/2008, 12/16/2018, 04/16/2019    Assessment:  COPD with severe airflow limitation FEV1 44% of predicted Chronic respiratory failure on 2LNC History of tobacco use disorder quit 1998 Breast Cancer - s/p lumpectomy and radiation July 2023 GERD well controlled  Plan/Recommendations:  Continue trelegy 1 puff once a day with albuterol  nebulizer or inhaler as needed.  Continue wearing oxygen  to keep saturations over 90%.  Continue pantoprazole for the reflux Call me if you need me sooner.   Ok to use your portable oxygen  concentrator at night while sleeping for short periods of time while traveling, but I prefer you use the larger concentrator because it is continuous instead of puls.   Will await CMN for her oxygen  to [employer].   Return to Care: Return in about 6 months (around 12/04/2024).   Verdon Gore, MD Pulmonary and Critical Care Medicine [facility] [phone]

## 2024-06-04 ENCOUNTER — Telehealth: Payer: Self-pay | Admitting: *Deleted

## 2024-06-04 NOTE — Telephone Encounter (Signed)
 Received CMN for oxygen  from [employer].  Placed in CMN folder for signature.

## 2024-06-05 DIAGNOSIS — E1169 Type 2 diabetes mellitus with other specified complication: Secondary | ICD-10-CM | POA: Diagnosis not present

## 2024-06-05 DIAGNOSIS — J449 Chronic obstructive pulmonary disease, unspecified: Secondary | ICD-10-CM | POA: Diagnosis not present

## 2024-06-05 DIAGNOSIS — I10 Essential (primary) hypertension: Secondary | ICD-10-CM | POA: Diagnosis not present

## 2024-06-05 DIAGNOSIS — E119 Type 2 diabetes mellitus without complications: Secondary | ICD-10-CM | POA: Diagnosis not present

## 2024-06-06 ENCOUNTER — Telehealth: Payer: Self-pay

## 2024-06-06 NOTE — Telephone Encounter (Addendum)
 Oxygen  therapy standard written order signed and faxed to [employer] at [phone]. Received fax confirmation.

## 2024-06-10 ENCOUNTER — Telehealth: Payer: Self-pay

## 2024-06-10 NOTE — Telephone Encounter (Signed)
 Pt called to let us  know she received a call from [employer] to schedule her mobile phlebotomy. She is asking what this is for. Reminded pt of previous tests and she agreed. She will call them back to schedule.

## 2024-06-16 DIAGNOSIS — E119 Type 2 diabetes mellitus without complications: Secondary | ICD-10-CM | POA: Diagnosis not present

## 2024-06-16 DIAGNOSIS — I10 Essential (primary) hypertension: Secondary | ICD-10-CM | POA: Diagnosis not present

## 2024-06-16 DIAGNOSIS — E1169 Type 2 diabetes mellitus with other specified complication: Secondary | ICD-10-CM | POA: Diagnosis not present

## 2024-06-16 DIAGNOSIS — E785 Hyperlipidemia, unspecified: Secondary | ICD-10-CM | POA: Diagnosis not present

## 2024-06-16 DIAGNOSIS — J449 Chronic obstructive pulmonary disease, unspecified: Secondary | ICD-10-CM | POA: Diagnosis not present

## 2024-06-20 DIAGNOSIS — E119 Type 2 diabetes mellitus without complications: Secondary | ICD-10-CM | POA: Diagnosis not present

## 2024-06-20 DIAGNOSIS — H2512 Age-related nuclear cataract, left eye: Secondary | ICD-10-CM | POA: Diagnosis not present

## 2024-06-20 DIAGNOSIS — H401133 Primary open-angle glaucoma, bilateral, severe stage: Secondary | ICD-10-CM | POA: Diagnosis not present

## 2024-06-26 ENCOUNTER — Ambulatory Visit: Attending: Cardiology | Admitting: Cardiology

## 2024-06-26 ENCOUNTER — Encounter: Payer: Self-pay | Admitting: Cardiology

## 2024-06-26 VITALS — BP 139/76 | HR 71 | Resp 16 | Ht 62.0 in | Wt 143.4 lb

## 2024-06-26 DIAGNOSIS — E782 Mixed hyperlipidemia: Secondary | ICD-10-CM | POA: Diagnosis not present

## 2024-06-26 DIAGNOSIS — E119 Type 2 diabetes mellitus without complications: Secondary | ICD-10-CM

## 2024-06-26 DIAGNOSIS — I6523 Occlusion and stenosis of bilateral carotid arteries: Secondary | ICD-10-CM | POA: Diagnosis not present

## 2024-06-26 DIAGNOSIS — I10 Essential (primary) hypertension: Secondary | ICD-10-CM | POA: Diagnosis not present

## 2024-06-26 NOTE — Patient Instructions (Signed)
 Medication Instructions:  Continue same medications *If you need a refill on your cardiac medications before your next appointment, please call your pharmacy*  Lab Work: None ordered  Testing/Procedures: Schedule Carotid dopplers  05/2025  Follow-Up: At [facility], you and your health needs are our priority.  As part of our continuing mission to provide you with exceptional heart care, our providers are all part of one team.  This team includes your primary Cardiologist (physician) and Advanced Practice Providers or APPs (Physician Assistants and Nurse Practitioners) who all work together to provide you with the care you need, when you need it.  Your next appointment:  1 year    Call in May to schedule Sept appointment     Provider:  Dr.Tolia    We recommend signing up for the patient portal called MyChart.  Sign up information is provided on this After Visit Summary.  MyChart is used to connect with patients for Virtual Visits (Telemedicine).  Patients are able to view lab/test results, encounter notes, upcoming appointments, etc.  Non-urgent messages can be sent to your provider as well.   To learn more about what you can do with MyChart, go to [email or web address].

## 2024-06-26 NOTE — Progress Notes (Signed)
 Cardiology Office Note:  .   ID:  Yolanda Morris, DOB 11-Dec-1945, MRN [id number] PCP:  Claudene Pellet, MD  Former Cardiology Providers: [facility] Providers Cardiologist:  Madonna Large, DO , [facility] (established care September 2022) Electrophysiologist:  None  Click to update primary MD,subspecialty MD or APP then REFRESH:1}    Chief Complaint  Patient presents with   Carotid atherosclerosis, bilateral   Follow-up    History of Present Illness: .   Yolanda Morris is a 78 y.o. Caucasian female whose past medical history and cardiovascular risk factors includes: hx of right retinal detachment, glaucoma, Right Breast IDC (s/p surgery and radiation), bilateral carotid artery atherosclerosis, hypertension, COPD on home oxygen  2L [place], diabetes, hyperlipidemia, genital herpes, history of migraines, former smoker, postmenopausal female, advance age.   Patient is being followed by the practice given her carotid artery atherosclerosis.    Since last office visit patient denies any anginal chest pain or heart failure symptoms.  No hospitalizations or urgent care visits for cardiovascular reasons.  She has been compliant with her medical therapy. She has been following up with ophthalmology for retinal detachment and glaucoma.   Outside labs reviewed.   Review of Systems: .   Review of Systems  Cardiovascular:  Negative for chest pain, claudication, irregular heartbeat, leg swelling, near-syncope, orthopnea, palpitations, paroxysmal nocturnal dyspnea and syncope.  Respiratory:  Negative for shortness of breath.   Hematologic/Lymphatic: Negative for bleeding problem.    Studies Reviewed:   EKG: EKG Interpretation Date/Time:  Wednesday June 26 2024 10:27:22 EDT Text Interpretation: Sinus rhythm with marked sinus arrhythmia When compared with ECG of 25-Sep-2023 10:46, No significant change was found Confirmed by Large Madonna [phone]) on 06/26/2024 10:40:00  AM  Echocardiogram: 07/27/2021: Left ventricle cavity is normal in size. Mild concentric hypertrophy of the left ventricle. Normal global wall motion. Normal LV systolic function with EF 61%. Doppler evidence of grade I (impaired) diastolic dysfunction, normal LAP.  Mild (Grade I) mitral regurgitation. Estimated right atrial pressure 8 mmHg.   Modified Bruce/Lexiscan  Tetrofosmin  stress test 07/14/2021: Lexiscan /modified Bruce nuclear stress test performed using 1-day protocol. Patient achieved 2.2 METS on modified Bruce protocol and reached 108% MPHR. Stress EKG at 108% MPHR showed sinus tachycardia, no ST-T wave abnormality. Normal myocardial perfusion. Stress LVEF 63%. Low risk study.  Carotid artery duplex  07/27/2021: Right ICA (1-15%) atherosclerosis, left ICA <50% stenosis.  See report for additional details  10/03/2022: Minimal bilateral carotid artery atherosclerosis.  See report for additional details  Carotid Duplex 10/20/2023 Right Carotid: Velocities in the right ICA are consistent with a 1-39% stenosis. Left Carotid: Velocities in the left ICA are consistent with a 1-39% stenosis. Vertebrals: Bilateral vertebral arteries demonstrate antegrade flow. Subclavians: Right subclavian artery flow was disturbed. Normal flow hemodynamics were seen in the left subclavian artery.   RADIOLOGY: NA  Risk Assessment/Calculations:   NA   Labs:       Latest Ref Rng & Units 01/06/2023    3:03 PM 07/07/2022    8:55 AM 02/02/2022   12:12 PM  CBC  WBC 4.0 - 10.5 K/uL 8.9  6.7  7.7   Hemoglobin 12.0 - 15.0 g/dL 85.3  85.0  85.8   Hematocrit 36.0 - 46.0 % 42.9  43.4  43.0   Platelets 150 - 400 K/uL 265  249  267        Latest Ref Rng & Units 01/06/2023    3:03 PM 07/07/2022    8:55 AM 02/02/2022  12:12 PM  BMP  Glucose 70 - 99 mg/dL 882  880  797   BUN 8 - 23 mg/dL 8  12  11    Creatinine 0.44 - 1.00 mg/dL 9.47  9.40  9.35   Sodium 135 - 145 mmol/L 137  140  140   Potassium 3.5  - 5.1 mmol/L 4.0  4.5  3.9   Chloride 98 - 111 mmol/L 100  102  101   CO2 22 - 32 mmol/L 23  33  33   Calcium  8.9 - 10.3 mg/dL 9.8  89.8  9.8       Latest Ref Rng & Units 01/06/2023    3:03 PM 07/07/2022    8:55 AM 02/02/2022   12:12 PM  CMP  Glucose 70 - 99 mg/dL 882  880  797   BUN 8 - 23 mg/dL 8  12  11    Creatinine 0.44 - 1.00 mg/dL 9.47  9.40  9.35   Sodium 135 - 145 mmol/L 137  140  140   Potassium 3.5 - 5.1 mmol/L 4.0  4.5  3.9   Chloride 98 - 111 mmol/L 100  102  101   CO2 22 - 32 mmol/L 23  33  33   Calcium  8.9 - 10.3 mg/dL 9.8  89.8  9.8   Total Protein 6.5 - 8.1 g/dL  7.2  7.6   Total Bilirubin 0.3 - 1.2 mg/dL  0.4  0.4   Alkaline Phos 38 - 126 U/L  64  74   AST 15 - 41 U/L  17  19   ALT 0 - 44 U/L  15  18     Lab Results  Component Value Date   CHOL 187 12/30/2021   HDL 69 12/30/2021   LDLCALC 94 12/30/2021   LDLDIRECT 84 12/30/2021   TRIG 137 12/30/2021   CHOLHDL 2.7 12/30/2021   No results for input(s): LIPOA in the last 8760 hours. No components found for: NTPROBNP No results for input(s): PROBNP in the last 8760 hours. No results for input(s): TSH in the last 8760 hours.  External Labs: Collected: April 16, 2024 [facility] database. Total cholesterol 154, triglycerides 158, HDL 66, LDL 62 Hemoglobin 85.6 g/dL Potassium 4.4 TSH 8.78  Physical Exam:    Today's Vitals   06/26/24 1018  BP: 139/76  Pulse: 71  Resp: 16  SpO2: 96%  Weight: 143 lb 6.4 oz (65 kg)  Height: 5' 2 (1.575 m)   Body mass index is 26.23 kg/m. Wt Readings from Last 3 Encounters:  06/26/24 143 lb 6.4 oz (65 kg)  06/03/24 142 lb (64.4 kg)  02/12/24 146 lb 4 oz (66.3 kg)    Physical Exam  Constitutional: No distress.  Age appropriate, hemodynamically stable.   Neck: No JVD present.  Cardiovascular: Normal rate, regular rhythm, S1 normal, S2 normal, intact distal pulses and normal pulses. Exam reveals no gallop, no S3 and no S4.  No murmur heard. Pulses:      Dorsalis  pedis pulses are 2+ on the right side and 2+ on the left side.       Posterior tibial pulses are 2+ on the right side and 2+ on the left side.  Pulmonary/Chest: Effort normal and breath sounds normal. No stridor. She has no wheezes. She has no rales.  Thoracic spine illustrates kyphosis.  Abdominal: Soft. Bowel sounds are normal. She exhibits no distension. There is no abdominal tenderness.  Musculoskeletal:        General:  No edema.     Cervical back: Neck supple.  Neurological: She is alert and oriented to person, place, and time. She has intact cranial nerves (2-12).  Skin: Skin is warm and moist.   Impression & Recommendation(s):  Impression:   ICD-10-CM   1. Carotid atherosclerosis, bilateral  I65.23 EKG 12-Lead    VAS US  CAROTID    2. Mixed hyperlipidemia  E78.2     3. Benign hypertension  I10     4. Type 2 diabetes mellitus without complication, without long-term current use of insulin  (HCC)  E11.9        Recommendation(s):  Carotid atherosclerosis, bilateral Mild stenosis <40% bilaterally, stable with no progression. Continue rosuvastatin  10 mg p.o. daily - Schedule carotid duplex ultrasound in August 2026. - Follow up in September 2026.  Mixed hyperlipidemia Currently on rosuvastatin  10 mg p.o. daily.   External labs from July 2025 independently reviewed via [facility] database, LDL 62 mg/dL, triglycerides 841 mg/dL.  Benign hypertension Office blood pressures are not well-controlled. Home blood pressures are better controlled. Given her history of retinal detachment and glaucoma have asked her to follow-up with ophthalmology to see if there is a particular blood pressure numbers daily would prefer. Ideally would recommend a goal SBP around 130 mmHg. Continue losartan  100 mg p.o. every morning  Type 2 diabetes mellitus without complication, without long-term current use of insulin  (HCC) Reemphasized importance of glycemic control. Currently on metformin, ARB, statin  therapy. Most recent hemoglobin A1c 6.6% as of July 2025   Orders Placed:  Orders Placed This Encounter  Procedures   EKG 12-Lead    Final Medication List:   No orders of the defined types were placed in this encounter.   Medications Discontinued During This Encounter  Medication Reason   losartan  (COZAAR ) 50 MG tablet Dose change   RSV vaccine recomb adjuvanted (AREXVY ) 120 MCG/0.5ML injection Completed Course     Current Outpatient Medications:    acetaminophen  (TYLENOL ) 325 MG tablet, Take 650 mg by mouth every 6 (six) hours as needed for moderate pain., Disp: , Rfl:    albuterol  (PROVENTIL ) (2.5 MG/3ML) 0.083% nebulizer solution, USE 1 VIAL IN NEBULIZER EVERY 6 HOURS - as needed for wheezing or shortness of breath, Disp: 120 mL, Rfl: 11   albuterol  (VENTOLIN  HFA) 108 (90 Base) MCG/ACT inhaler, Inhale 2 puffs into the lungs every 6 (six) hours as needed for wheezing or shortness of breath. Fill upon patient request, Disp: 3 each, Rfl: 3   b complex vitamins capsule, Take 1 capsule by mouth daily., Disp: , Rfl:    CALCIUM  CITRATE PO, Take 600 mg by mouth daily., Disp: , Rfl:    cetirizine (ZYRTEC) 10 MG tablet, Take 10 mg by mouth daily., Disp: , Rfl:    Cholecalciferol (VITAMIN D3) 1000 UNITS CAPS, Take 1,000 Units by mouth daily., Disp: , Rfl:    Cranberry 500 MG TABS, Take 500 mg by mouth daily., Disp: , Rfl:    denosumab  (PROLIA ) 60 MG/ML SOSY injection, Inject 60 mg into the skin every 6 (six) months., Disp: , Rfl:    fluticasone  (FLONASE) 50 MCG/ACT nasal spray, Place 1 spray into both nostrils daily., Disp: , Rfl:    Fluticasone -Umeclidin-Vilant (TRELEGY ELLIPTA ) 100-62.5-25 MCG/ACT AEPB, Inhale 1 puff into the lungs daily., Disp: 60 each, Rfl: 3   hydrocortisone cream 1 %, Apply 1 Application topically 2 (two) times daily as needed for itching (rash)., Disp: , Rfl:    ibuprofen (ADVIL) 200 MG tablet, Take  200 mg by mouth every 6 (six) hours as needed for moderate pain.,  Disp: , Rfl:    IGLUCOSE TEST STRIPS test strip, See admin instructions., Disp: , Rfl:    L-Lysine 500 MG CAPS, Take 500 mg by mouth daily., Disp: , Rfl:    Lancets (STERILANCE TL) MISC, See admin instructions., Disp: , Rfl:    latanoprost (XALATAN) 0.005 % ophthalmic solution, Place 1 drop into both eyes at bedtime., Disp: , Rfl:    losartan  (COZAAR ) 100 MG tablet, Take 100 mg by mouth daily., Disp: , Rfl:    Melatonin 3 MG CAPS, Take 3 mg by mouth at bedtime., Disp: , Rfl:    metFORMIN (GLUCOPHAGE-XR) 500 MG 24 hr tablet, Take 1,000 mg by mouth daily after supper., Disp: , Rfl:    Multiple Vitamins-Minerals (CENTRUM SILVER PO), Take 1 tablet by mouth daily.  Women 50+, Disp: , Rfl:    omeprazole (PRILOSEC) 20 MG capsule, Take 20 mg by mouth every other day. , Disp: , Rfl:    OXYGEN , Inhale 2 L into the lungs daily as needed (SOB)., Disp: , Rfl:    polyethylene glycol (MIRALAX / GLYCOLAX) 17 g packet, Take 17 g by mouth daily as needed for moderate constipation., Disp: , Rfl:    rosuvastatin  (CRESTOR ) 10 MG tablet, Take 1 tablet (10 mg total) by mouth at bedtime., Disp: 90 tablet, Rfl: 0   traMADol  (ULTRAM ) 50 MG tablet, Take 1 tablet (50 mg total) by mouth every 6 (six) hours as needed. (Patient not taking: Reported on 06/26/2024), Disp: 20 tablet, Rfl: 0   triamcinolone cream (KENALOG) 0.5 %, Apply 1 Application topically daily as needed (cracked skin). (Patient not taking: Reported on 06/26/2024), Disp: , Rfl:    valACYclovir (VALTREX) 500 MG tablet, Take 500 mg by mouth 2 (two) times daily as needed (outbreaks). For 3 days (Patient not taking: Reported on 06/26/2024), Disp: , Rfl:   Consent:   NA  Disposition:   1 year follow up  Patient may be asked to follow-up sooner based on the results of the above-mentioned testing.  Her questions and concerns were addressed to her satisfaction. She voices understanding of the recommendations provided during this encounter.    Signed, Madonna Michele HAS, [facility]  A Division of [facility] [address] [place], [place] [address]

## 2024-07-01 ENCOUNTER — Telehealth: Payer: Self-pay | Admitting: *Deleted

## 2024-07-01 NOTE — Telephone Encounter (Signed)
 Called to make pt aware of negative Guardant Reveal results. Pt verbalized understanding

## 2024-07-05 DIAGNOSIS — I10 Essential (primary) hypertension: Secondary | ICD-10-CM | POA: Diagnosis not present

## 2024-07-05 DIAGNOSIS — E1169 Type 2 diabetes mellitus with other specified complication: Secondary | ICD-10-CM | POA: Diagnosis not present

## 2024-07-05 DIAGNOSIS — J449 Chronic obstructive pulmonary disease, unspecified: Secondary | ICD-10-CM | POA: Diagnosis not present

## 2024-07-05 DIAGNOSIS — E119 Type 2 diabetes mellitus without complications: Secondary | ICD-10-CM | POA: Diagnosis not present

## 2024-07-06 ENCOUNTER — Encounter: Payer: Self-pay | Admitting: Cardiology

## 2024-07-12 ENCOUNTER — Inpatient Hospital Stay: Attending: Hematology and Oncology | Admitting: Hematology and Oncology

## 2024-07-12 VITALS — BP 138/46 | HR 66 | Temp 98.1°F | Resp 18 | Ht 62.0 in | Wt 144.6 lb

## 2024-07-12 DIAGNOSIS — Z87891 Personal history of nicotine dependence: Secondary | ICD-10-CM | POA: Insufficient documentation

## 2024-07-12 DIAGNOSIS — Z800 Family history of malignant neoplasm of digestive organs: Secondary | ICD-10-CM | POA: Diagnosis not present

## 2024-07-12 DIAGNOSIS — Z8041 Family history of malignant neoplasm of ovary: Secondary | ICD-10-CM | POA: Insufficient documentation

## 2024-07-12 DIAGNOSIS — M810 Age-related osteoporosis without current pathological fracture: Secondary | ICD-10-CM | POA: Diagnosis not present

## 2024-07-12 DIAGNOSIS — C50411 Malignant neoplasm of upper-outer quadrant of right female breast: Secondary | ICD-10-CM | POA: Insufficient documentation

## 2024-07-12 DIAGNOSIS — Z170 Estrogen receptor positive status [ER+]: Secondary | ICD-10-CM | POA: Insufficient documentation

## 2024-07-12 NOTE — Assessment & Plan Note (Addendum)
 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  Given the very small size of the tumor, we have recommended considering surgery upfront followed by consideration for adjuvant chemotherapy based on the final pathology.   She is s/p right breast lumpectomy, final pathology showed 8 mm invasive ductal carcinoma, grade 2, triple negative, Ki67 of 15% Given final pathology showing tumor of 8 mm, we have discussed about considering adjuvant chemotherapy.  We have discussed about docetaxel and cyclophosphamide every 21 days for 4 cycles.  She understands that the role of chemotherapy is to reduce the risk of distant metastasis as well as local recurrence. She declined adjuvant chemo.  She completed adjuvant radiation and is now here for follow-up.   She is doing well. No concerns for breast cancer recurrence. Continue annual mammograms as recommended No role for antiestrogen therapy Continue to work with ophthalmology regarding glaucoma management. RTC as scheduled.

## 2024-07-12 NOTE — Progress Notes (Signed)
  Cancer Center CONSULT NOTE  Patient Care Team: Claudene Pellet, MD as PCP - General (Family Medicine) Michele Richardson, DO as PCP - Cardiology (Cardiology) Vernetta Berg, MD as Consulting Physician (General Surgery) Loretha Ash, MD as Consulting Physician (Hematology and Oncology) Izell Domino, MD as Attending Physician (Radiation Oncology) Faythe Purchase, MD as Consulting Physician (Endocrinology) Michele Richardson, DO as Consulting Physician (Cardiology) Meade Verdon RAMAN, MD as Consulting Physician (Pulmonary Disease)  CHIEF COMPLAINTS/PURPOSE OF CONSULTATION:  Newly diagnosed breast cancer  HISTORY OF PRESENTING ILLNESS:  Yolanda Morris 78 y.o. female is here because of recent diagnosis of right breast IDC  I reviewed her records extensively and collaborated the history with the patient.  SUMMARY OF ONCOLOGIC HISTORY: Oncology History  Malignant neoplasm of upper-outer quadrant of right breast in female, estrogen receptor positive (HCC)  12/27/2021 Mammogram   Screening mammogram showed a right breast asymmetry warranting further investigation. Diagnostic mammogram showed 6 x 7 x 8 mm irregular hypoechoic right breast mass at 10 o'clock position 5 cm from the nipple.  No right axillary adenopathy   01/24/2022 Pathology Results   Pathology results showed right breast invasive ductal carcinoma, grade 2.  Prognostic showed ER 30%, positive weak staining intensity.  PR 0%, negative.  Ki-67 of 20%.  Tumor cells are negative for HER2 1+    01/28/2022 Initial Diagnosis   Malignant neoplasm of upper-outer quadrant of right breast in female, estrogen receptor positive (HCC)   02/02/2022 Cancer Staging   Staging form: Breast, AJCC 8th Edition - Pathologic: Stage IB (pT1b, pN0, cM0, G2, ER-, PR-, HER2-) - Signed by Loretha Ash, MD on 03/01/2022 Histologic grading system: 3 grade system   02/08/2022 Genetic Testing   Negative hereditary cancer genetic testing: no pathogenic  variants detected in [facility] BRCAPlus Panel and [employer] CustomNext-Cancer +RNAinsight.  Report dates are February 08, 2022 and February 13, 2022.   The BRCAplus panel offered by [employer] and includes sequencing and deletion/duplication analysis for the following 8 genes: ATM, BRCA1, BRCA2, CDH1, CHEK2, PALB2, PTEN, and TP53.  The CustomNext-Cancer+RNAinsight panel offered by [employer] includes sequencing and rearrangement analysis for the following 47 genes:  APC, ATM, AXIN2, BARD1, BMPR1A, BRCA1, BRCA2, BRIP1, CDH1, CDK4, CDKN2A, CHEK2, DICER1, EPCAM, GREM1, HOXB13, MEN1, MLH1, MSH2, MSH3, MSH6, MUTYH, NBN, NF1, NF2, NTHL1, PALB2, PMS2, POLD1, POLE, PTEN, RAD51C, RAD51D, RECQL, RET, SDHA, SDHAF2, SDHB, SDHC, SDHD, SMAD4, SMARCA4, STK11, TP53, TSC1, TSC2, and VHL.  RNA data is routinely analyzed for use in variant interpretation for all genes.   02/16/2022 Surgery   Patient had right breast lumpectomy, deep sentinel lymph node biopsy on 02/16/2022.  Pathology showed invasive ductal carcinoma, 0.8 cm in maximal dimension, and 0.4 cm of the closest resection margin, DCIS coming to less than 0.1 cm of the closest resection margin.  No metastatic carcinoma in 4 out of 4 lymph nodes.  Prior prognostic showed ER 30% positive weak staining, PR 0% negative.  Repeat prognostics from final pathology showed ER negative, PR negative, KI of 15% and HER2 negative   03/31/2022 - 04/28/2022 Radiation Therapy   Site Technique Total Dose (Gy) Dose per Fx (Gy) Completed Fx Beam Energies  Breast, Right: Breast_R 3D 40.05/40.05 2.67 15/15 10XFFF  Breast, Right: Breast_R_Bst 3D 10/10 2 5/5 6X, 10X      History of Present Illness    Discussed the use of AI scribe software for clinical note transcription with the patient, who gave verbal consent to proceed.  History of Present Illness  Yolanda Morris is a 78 year old female with history of breast cancer, glaucoma who presents with concerns about her eye pressure and vision  changes. She is under the care of Dr. Octavia, an ophthalmologist, for her glaucoma management.  She has been diagnosed with glaucoma and has undergone laser surgery on her left eye. Recent evaluations have shown increased intraocular pressure in both eyes, with the left eye slightly better than the right. She was previously using Refresh PF. A follow-up appointment is scheduled for next month.  She experiences significant vision issues, particularly in her right eye, where she describes everything as 'blurry' and lacks peripheral vision. Despite these challenges, she was able to pass a driver's vision test, although she feels nervous driving, especially with passengers, due to her vision problems. She mentions that her right eye sometimes feels like it is trying to dominate, and she is concerned about the potential progression of glaucoma to the center of her vision.  She has been experiencing some pain in both breasts, more so in the right one, but not constantly. She attributes this to stress, as she has been dealing with multiple home-related issues, including a high utility bill and recent plumbing problems. She reports that her mammogram in March was reported as normal to her, and she recently had a blood test, which was reported as good.  She is dealing with stress from home maintenance issues, including a costly air conditioning replacement and plumbing problems. She mentions financial strain due to these unexpected expenses. She lives alone and has been managing home maintenance issues since the loss of her husband a few years ago.  Rest of the pertinent 10 point ROS reviewed and negative  MEDICAL HISTORY:  Past Medical History:  Diagnosis Date   Cancer (HCC)    skin   COPD (chronic obstructive pulmonary disease) (HCC)    DM (diabetes mellitus) (HCC)    Dyspnea    increased activity    Family history of adverse reaction to anesthesia    Family history of ovarian cancer 02/03/2022    Family history of stomach cancer 02/03/2022   Genital herpes    GERD (gastroesophageal reflux disease)    Hyperlipidemia    Migraines    Osteoporosis    Pneumonia    Unspecified essential hypertension     SURGICAL HISTORY: Past Surgical History:  Procedure Laterality Date   BREAST BIOPSY     BREAST EXCISIONAL BIOPSY     over 20 years ago- benign   BREAST LUMPECTOMY WITH RADIOACTIVE SEED AND SENTINEL LYMPH NODE BIOPSY Right 02/16/2022   Procedure: RIGHT BREAST LUMPECTOMY WITH RADIOACTIVE SEED AND SENTINEL LYMPH NODE BIOPSY;  Surgeon: Vernetta Berg, MD;  Location: [facility] OR;  Service: General;  Laterality: Right;   BUNIONECTOMY Left 2004   and hammer toe repair Left foot   COLON SURGERY     removal of polyps   COLONOSCOPY WITH PROPOFOL  N/A 12/20/2022   Procedure: COLONOSCOPY WITH PROPOFOL ;  Surgeon: Saintclair Jasper, MD;  Location: [facility];  Service: Gastroenterology;  Laterality: N/A;   DENTAL SURGERY     GAS INSERTION Right 01/06/2023   Procedure: INSERTION OF GAS C3F8;  Surgeon: Elner Arley LABOR, MD;  Location: [facility] OR;  Service: Ophthalmology;  Laterality: Right;   POLYPECTOMY  12/20/2022   Procedure: POLYPECTOMY;  Surgeon: Saintclair Jasper, MD;  Location: [facility];  Service: Gastroenterology;;  polypectomy with cold forceps   RETINAL TEAR REPAIR CRYOTHERAPY     SCLERAL BUCKLE WITH CRYO Right 01/06/2023  Procedure: SCLERAL BUCKLE WITH CRYO PEXY;  Surgeon: Elner Arley LABOR, MD;  Location: [facility] OR;  Service: Ophthalmology;  Laterality: Right;    SOCIAL HISTORY: Social History   Socioeconomic History   Marital status: Widowed    Spouse name: Not on file   Number of children: Not on file   Years of education: Not on file   Highest education level: Not on file  Occupational History   Occupation: Marketing executive  Tobacco Use   Smoking status: Former    Current packs/day: 0.00    Average packs/day: 2.0 packs/day for 25.0 years (50.0 ttl pk-yrs)    Types: Cigarettes    Start  date: 10/18/1971    Quit date: 10/17/1996    Years since quitting: 27.7   Smokeless tobacco: Never  Vaping Use   Vaping status: Never Used  Substance and Sexual Activity   Alcohol use: No    Alcohol/week: 0.0 standard drinks of alcohol   Drug use: No   Sexual activity: Not Currently    Birth control/protection: Post-menopausal  Other Topics Concern   Not on file  Social History Narrative   No children   Social Drivers of Corporate investment banker Strain: Not on file  Food Insecurity: Not on file  Transportation Needs: Not on file  Physical Activity: Not on file  Stress: Not on file  Social Connections: Not on file  Intimate Partner Violence: Not on file    FAMILY HISTORY: Family History  Problem Relation Age of Onset   COPD Mother        deceased   Heart disease Mother    Alcohol abuse Father    Alcohol abuse Sister        deceased   Heart attack Sister    Alcohol abuse Brother        deceased   Cancer Maternal Aunt        unknown type; d. 39   Cancer Maternal Uncle        unknown type; d. 32s   Ovarian cancer Paternal Aunt        d. 76   Stomach cancer Paternal Aunt 86   Breast cancer Neg Hx     ALLERGIES:  is allergic to telithromycin, bacitracin, and levofloxacin .  MEDICATIONS:  Current Outpatient Medications  Medication Sig Dispense Refill   acetaminophen  (TYLENOL ) 325 MG tablet Take 650 mg by mouth every 6 (six) hours as needed for moderate pain.     albuterol  (PROVENTIL ) (2.5 MG/3ML) 0.083% nebulizer solution USE 1 VIAL IN NEBULIZER EVERY 6 HOURS - as needed for wheezing or shortness of breath 120 mL 11   albuterol  (VENTOLIN  HFA) 108 (90 Base) MCG/ACT inhaler Inhale 2 puffs into the lungs every 6 (six) hours as needed for wheezing or shortness of breath. Fill upon patient request 3 each 3   b complex vitamins capsule Take 1 capsule by mouth daily.     CALCIUM  CITRATE PO Take 600 mg by mouth daily.     cetirizine (ZYRTEC) 10 MG tablet Take 10 mg by mouth  daily.     Cholecalciferol (VITAMIN D3) 1000 UNITS CAPS Take 1,000 Units by mouth daily.     Cranberry 500 MG TABS Take 500 mg by mouth daily.     denosumab  (PROLIA ) 60 MG/ML SOSY injection Inject 60 mg into the skin every 6 (six) months.     fluticasone  (FLONASE) 50 MCG/ACT nasal spray Place 1 spray into both nostrils daily.     Fluticasone -Umeclidin-Vilant (  TRELEGY ELLIPTA ) 100-62.5-25 MCG/ACT AEPB Inhale 1 puff into the lungs daily. 60 each 3   hydrocortisone cream 1 % Apply 1 Application topically 2 (two) times daily as needed for itching (rash).     ibuprofen (ADVIL) 200 MG tablet Take 200 mg by mouth every 6 (six) hours as needed for moderate pain.     IGLUCOSE TEST STRIPS test strip See admin instructions.     L-Lysine 500 MG CAPS Take 500 mg by mouth daily.     Lancets (STERILANCE TL) MISC See admin instructions.     latanoprost (XALATAN) 0.005 % ophthalmic solution Place 1 drop into both eyes at bedtime.     losartan  (COZAAR ) 100 MG tablet Take 100 mg by mouth daily.     Melatonin 3 MG CAPS Take 3 mg by mouth at bedtime.     metFORMIN (GLUCOPHAGE-XR) 500 MG 24 hr tablet Take 1,000 mg by mouth daily after supper.     Multiple Vitamins-Minerals (CENTRUM SILVER PO) Take 1 tablet by mouth daily.  Women 50+     omeprazole (PRILOSEC) 20 MG capsule Take 20 mg by mouth every other day.      OXYGEN  Inhale 2 L into the lungs daily as needed (SOB).     polyethylene glycol (MIRALAX / GLYCOLAX) 17 g packet Take 17 g by mouth daily as needed for moderate constipation.     rosuvastatin  (CRESTOR ) 10 MG tablet Take 1 tablet (10 mg total) by mouth at bedtime. 90 tablet 0   traMADol  (ULTRAM ) 50 MG tablet Take 1 tablet (50 mg total) by mouth every 6 (six) hours as needed. (Patient not taking: Reported on 06/26/2024) 20 tablet 0   triamcinolone cream (KENALOG) 0.5 % Apply 1 Application topically daily as needed (cracked skin). (Patient not taking: Reported on 06/26/2024)     valACYclovir (VALTREX) 500 MG  tablet Take 500 mg by mouth 2 (two) times daily as needed (outbreaks). For 3 days (Patient not taking: Reported on 06/26/2024)     No current facility-administered medications for this visit.    REVIEW OF SYSTEMS:   Constitutional: Denies fevers, chills or abnormal night sweats Eyes: Denies blurriness of vision, double vision or watery eyes Ears, nose, mouth, throat, and face: Denies mucositis or sore throat Respiratory: Denies cough, dyspnea or wheezes Cardiovascular: Denies palpitation, chest discomfort or lower extremity swelling Gastrointestinal:  Denies nausea, heartburn or change in bowel habits Skin: Denies abnormal skin rashes Lymphatics: Denies new lymphadenopathy or easy bruising Neurological:Denies numbness, tingling or new weaknesses Behavioral/Psych: Mood is stable, no new changes  Breast: Denies any palpable lumps or discharge All other systems were reviewed with the patient and are negative.  PHYSICAL EXAMINATION: ECOG PERFORMANCE STATUS: 1 - Symptomatic but completely ambulatory  Vitals:   07/12/24 1133  BP: (!) 138/46  Pulse: 66  Resp: 18  Temp: 98.1 F (36.7 C)  SpO2: 95%     Filed Weights   07/12/24 1133  Weight: 144 lb 9.6 oz (65.6 kg)    Physical Exam Constitutional:      Appearance: Normal appearance.  Chest:     Comments: Bilateral breasts inspected.  Scattered density noted.  Postsurgical changes.  Otherwise no palpable masses or regional adenopathy. Musculoskeletal:        General: Normal range of motion.     Cervical back: Normal range of motion and neck supple. No rigidity.     Comments: Varicose veins  Lymphadenopathy:     Cervical: No cervical adenopathy.  Neurological:  Mental Status: She is alert.      LABORATORY DATA:  I have reviewed the data as listed Lab Results  Component Value Date   WBC 8.9 01/06/2023   HGB 14.6 01/06/2023   HCT 42.9 01/06/2023   MCV 90.1 01/06/2023   PLT 265 01/06/2023   Lab Results  Component  Value Date   NA 137 01/06/2023   K 4.0 01/06/2023   CL 100 01/06/2023   CO2 23 01/06/2023    RADIOGRAPHIC STUDIES: I have personally reviewed the radiological reports and agreed with the findings in the report.  Malignant neoplasm of upper-outer quadrant of right breast in female, estrogen receptor positive (HCC) This is a very pleasant 78 year old postmenopausal female patient with newly diagnosed right breast upper outer quadrant invasive ductal carcinoma, ER 30% weak, PR negative and HER2 negative KI of 20% presented in the breast MDC for recommendations.  Given the very small size of the tumor, we have recommended considering surgery upfront followed by consideration for adjuvant chemotherapy based on the final pathology.   She is s/p right breast lumpectomy, final pathology showed 8 mm invasive ductal carcinoma, grade 2, triple negative, Ki67 of 15% Given final pathology showing tumor of 8 mm, we have discussed about considering adjuvant chemotherapy.  We have discussed about docetaxel and cyclophosphamide every 21 days for 4 cycles.  She understands that the role of chemotherapy is to reduce the risk of distant metastasis as well as local recurrence. She declined adjuvant chemo.  She completed adjuvant radiation and is now here for follow-up.   She is doing well. No concerns for breast cancer recurrence. Continue annual mammograms as recommended No role for antiestrogen therapy Continue to work with ophthalmology regarding glaucoma management. RTC as scheduled.    Time spent: 30 min   Amber Stalls, MD 07/12/24

## 2024-07-15 DIAGNOSIS — Z23 Encounter for immunization: Secondary | ICD-10-CM | POA: Diagnosis not present

## 2024-07-16 DIAGNOSIS — I10 Essential (primary) hypertension: Secondary | ICD-10-CM | POA: Diagnosis not present

## 2024-07-16 DIAGNOSIS — J449 Chronic obstructive pulmonary disease, unspecified: Secondary | ICD-10-CM | POA: Diagnosis not present

## 2024-07-16 DIAGNOSIS — E119 Type 2 diabetes mellitus without complications: Secondary | ICD-10-CM | POA: Diagnosis not present

## 2024-07-16 DIAGNOSIS — E785 Hyperlipidemia, unspecified: Secondary | ICD-10-CM | POA: Diagnosis not present

## 2024-07-16 DIAGNOSIS — E1169 Type 2 diabetes mellitus with other specified complication: Secondary | ICD-10-CM | POA: Diagnosis not present

## 2024-07-22 ENCOUNTER — Other Ambulatory Visit: Payer: Self-pay | Admitting: *Deleted

## 2024-07-22 ENCOUNTER — Telehealth: Payer: Self-pay | Admitting: Internal Medicine

## 2024-07-22 DIAGNOSIS — Z23 Encounter for immunization: Secondary | ICD-10-CM | POA: Diagnosis not present

## 2024-07-22 MED ORDER — ALBUTEROL SULFATE (2.5 MG/3ML) 0.083% IN NEBU: 2.5000 mg | INHALATION_SOLUTION | Freq: Four times a day (QID) | RESPIRATORY_TRACT | 11 refills | Status: AC | PRN

## 2024-07-22 NOTE — Telephone Encounter (Signed)
 Copied from CRM #[id number]. Topic: Clinical - Medication Prior Auth >> Jul 22, 2024 12:39 PM Devaughn RAMAN wrote: Reason for CRM: Patient is calling regarding a new authorization for her albuterol  (PROVENTIL ) (2.5 MG/3ML) 0.083% nebulizer solution medication. Patient stated [facility] advised she needs a new authorization for the medication.  Publishing copy on [employer] Fax-[phone] Phone-[phone]

## 2024-07-22 NOTE — Telephone Encounter (Signed)
 Called and spoke with patient, advised her that I had sent the refill for her albuterol  nebulizer solution electronically to [facility].  She verbalized understanding.  Nothing further needed.

## 2024-07-31 DIAGNOSIS — H401133 Primary open-angle glaucoma, bilateral, severe stage: Secondary | ICD-10-CM | POA: Diagnosis not present

## 2024-07-31 DIAGNOSIS — H2512 Age-related nuclear cataract, left eye: Secondary | ICD-10-CM | POA: Diagnosis not present

## 2024-07-31 DIAGNOSIS — Z961 Presence of intraocular lens: Secondary | ICD-10-CM | POA: Diagnosis not present

## 2024-08-04 DIAGNOSIS — J449 Chronic obstructive pulmonary disease, unspecified: Secondary | ICD-10-CM | POA: Diagnosis not present

## 2024-08-04 DIAGNOSIS — E1169 Type 2 diabetes mellitus with other specified complication: Secondary | ICD-10-CM | POA: Diagnosis not present

## 2024-08-04 DIAGNOSIS — E119 Type 2 diabetes mellitus without complications: Secondary | ICD-10-CM | POA: Diagnosis not present

## 2024-08-04 DIAGNOSIS — I10 Essential (primary) hypertension: Secondary | ICD-10-CM | POA: Diagnosis not present

## 2024-08-12 ENCOUNTER — Encounter: Payer: Self-pay | Admitting: Rehabilitation

## 2024-08-12 ENCOUNTER — Ambulatory Visit: Attending: Hematology and Oncology | Admitting: Rehabilitation

## 2024-08-12 DIAGNOSIS — Z483 Aftercare following surgery for neoplasm: Secondary | ICD-10-CM | POA: Insufficient documentation

## 2024-08-12 NOTE — Therapy (Signed)
 OUTPATIENT PHYSICAL THERAPY SOZO SCREENING NOTE   Patient Name: Yolanda Morris MRN: [id number] DOB:[birth year], 78 y.o., female Today's Date: 08/12/2024  PCP: Claudene Pellet, MD REFERRING PROVIDER: Loretha Ash, MD   PT End of Session - 08/12/24 1046     Visit Number 13    PT Start Time 1044    PT Stop Time 1050    PT Time Calculation (min) 6 min    Activity Tolerance Patient tolerated treatment well    Behavior During Therapy WFL for tasks assessed/performed          Past Medical History:  Diagnosis Date   Cancer (HCC)    skin   COPD (chronic obstructive pulmonary disease) (HCC)    DM (diabetes mellitus) (HCC)    Dyspnea    increased activity    Family history of adverse reaction to anesthesia    Family history of ovarian cancer 02/03/2022   Family history of stomach cancer 02/03/2022   Genital herpes    GERD (gastroesophageal reflux disease)    Hyperlipidemia    Migraines    Osteoporosis    Pneumonia    Unspecified essential hypertension    Past Surgical History:  Procedure Laterality Date   BREAST BIOPSY     BREAST EXCISIONAL BIOPSY     over 20 years ago- benign   BREAST LUMPECTOMY WITH RADIOACTIVE SEED AND SENTINEL LYMPH NODE BIOPSY Right 02/16/2022   Procedure: RIGHT BREAST LUMPECTOMY WITH RADIOACTIVE SEED AND SENTINEL LYMPH NODE BIOPSY;  Surgeon: Vernetta Berg, MD;  Location: [facility] OR;  Service: General;  Laterality: Right;   BUNIONECTOMY Left 2004   and hammer toe repair Left foot   COLON SURGERY     removal of polyps   COLONOSCOPY WITH PROPOFOL  N/A 12/20/2022   Procedure: COLONOSCOPY WITH PROPOFOL ;  Surgeon: Saintclair Jasper, MD;  Location: [facility];  Service: Gastroenterology;  Laterality: N/A;   DENTAL SURGERY     GAS INSERTION Right 01/06/2023   Procedure: INSERTION OF GAS C3F8;  Surgeon: Elner Arley LABOR, MD;  Location: [facility] OR;  Service: Ophthalmology;  Laterality: Right;   POLYPECTOMY  12/20/2022   Procedure: POLYPECTOMY;  Surgeon: Saintclair Jasper, MD;   Location: [facility];  Service: Gastroenterology;;  polypectomy with cold forceps   RETINAL TEAR REPAIR CRYOTHERAPY     SCLERAL BUCKLE WITH CRYO Right 01/06/2023   Procedure: SCLERAL BUCKLE WITH CRYO PEXY;  Surgeon: Elner Arley LABOR, MD;  Location: [facility] OR;  Service: Ophthalmology;  Laterality: Right;   Patient Active Problem List   Diagnosis Date Noted   Genetic testing 02/09/2022   Family history of ovarian cancer 02/03/2022   Family history of stomach cancer 02/03/2022   Malignant neoplasm of upper-outer quadrant of right breast in female, estrogen receptor positive (HCC) 01/28/2022   Medication management 07/21/2020   Healthcare maintenance 07/21/2020   Body aches 07/22/2019   Chronic respiratory failure with hypoxia (HCC) 06/17/2019   Allergic rhinitis 11/13/2018   Shortness of breath 10/23/2018   COPD exacerbation (HCC) 01/07/2015   Cough 07/05/2013   Acute sinusitis 02/12/2013   Acute bronchitis 07/08/2011   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) 01/05/2011    REFERRING DIAG: right breast cancer at risk for lymphedema  THERAPY DIAG: Aftercare following surgery for neoplasm  PERTINENT HISTORY: Patient was diagnosed on 12/27/2021 with right grade 2 invasive ductal carcinoma breast cancer. It measures 8 mm and is located in the upper outer quadrant. It is weakly ER positive, PR negative, and HER2 negative with a  Ki67 of 20%. She has stage 4 COPD and is on 2 liters of oxygen . Post Rt breast lumpectomy with no carcinoma in 4 removed nodes on 02/16/22. Will be having radiation.   PRECAUTIONS: right UE Lymphedema risk, None  SUBJECTIVE: Pt returns for her last 3 month L-Dex screen.   PAIN:  Are you having pain? No  SOZO SCREENING: Patient was assessed today using the SOZO machine to determine the lymphedema index score. This was compared to her baseline score. It was determined that she is within the recommended range when compared to her baseline and no further action is needed at  this time. She will continue SOZO screenings. These are done every 3 months for 2 years post operatively followed by every 6 months for 2 years, and then annually.   L-DEX FLOWSHEETS - 08/12/24 1000       L-DEX LYMPHEDEMA SCREENING   Measurement Type Unilateral    L-DEX MEASUREMENT EXTREMITY Upper Extremity    POSITION  Standing    DOMINANT SIDE Right    At Risk Side Right    BASELINE SCORE (UNILATERAL) -9    L-DEX SCORE (UNILATERAL) -11.1    VALUE CHANGE (UNILAT) -2.1             Yolanda Morris R, PT 08/12/2024, 10:50 AM

## 2024-08-13 DIAGNOSIS — H3341 Traction detachment of retina, right eye: Secondary | ICD-10-CM | POA: Diagnosis not present

## 2024-08-13 DIAGNOSIS — T85398A Other mechanical complication of other ocular prosthetic devices, implants and grafts, initial encounter: Secondary | ICD-10-CM | POA: Diagnosis not present

## 2024-08-13 DIAGNOSIS — H2511 Age-related nuclear cataract, right eye: Secondary | ICD-10-CM | POA: Diagnosis not present

## 2024-08-13 DIAGNOSIS — H33021 Retinal detachment with multiple breaks, right eye: Secondary | ICD-10-CM | POA: Diagnosis not present

## 2024-08-13 DIAGNOSIS — H401130 Primary open-angle glaucoma, bilateral, stage unspecified: Secondary | ICD-10-CM | POA: Diagnosis not present

## 2024-08-13 DIAGNOSIS — H2512 Age-related nuclear cataract, left eye: Secondary | ICD-10-CM | POA: Diagnosis not present

## 2024-08-16 DIAGNOSIS — E119 Type 2 diabetes mellitus without complications: Secondary | ICD-10-CM | POA: Diagnosis not present

## 2024-08-16 DIAGNOSIS — J449 Chronic obstructive pulmonary disease, unspecified: Secondary | ICD-10-CM | POA: Diagnosis not present

## 2024-08-16 DIAGNOSIS — E785 Hyperlipidemia, unspecified: Secondary | ICD-10-CM | POA: Diagnosis not present

## 2024-08-16 DIAGNOSIS — E1169 Type 2 diabetes mellitus with other specified complication: Secondary | ICD-10-CM | POA: Diagnosis not present

## 2024-08-16 DIAGNOSIS — I10 Essential (primary) hypertension: Secondary | ICD-10-CM | POA: Diagnosis not present

## 2024-08-20 NOTE — Telephone Encounter (Signed)
 CMN signed by Dr. Meade and faxed to [facility] on 08/06/2024.  Received fax confirmation that the fax was sent successfully.  Copy made and put in cabinet.  Original sent to scan.  Nothing further needed.

## 2024-08-26 ENCOUNTER — Telehealth: Payer: Self-pay | Admitting: Internal Medicine

## 2024-08-26 NOTE — Telephone Encounter (Signed)
 Copied from CRM [id number]. Topic: General - Other >> Aug 26, 2024  7:59 AM Benton KIDD wrote: Reason for CRM: patient is calling because she received a letter about dr meade leaving and wanted to know who do the dr recommend another  provider within the clinic . Please give patient a call back concerning this information [phone]    Does Dr.Desai have any recommendations

## 2024-08-27 NOTE — Telephone Encounter (Signed)
 I called and spoke with patient, she wanted recommendations on a provider once Dr. Meade leaves at the end of December.  I advised her that she can see anyone in the practice, we have some new female providers if she would prefer a female provider or we have providers that have been here for some time.  I looked at what was available in February when she is due for f/u.  I scheduled her to see Dr. Kara on 11/25/24 at 10 am, advised to arrive by 9:45 am for check in.  I let her know if she nerds something in the meantime to give us  a call and we always have Nps that can see her.  She verbalized understanding.  Nothing further needed.

## 2024-09-03 DIAGNOSIS — E119 Type 2 diabetes mellitus without complications: Secondary | ICD-10-CM | POA: Diagnosis not present

## 2024-09-03 DIAGNOSIS — I10 Essential (primary) hypertension: Secondary | ICD-10-CM | POA: Diagnosis not present

## 2024-09-03 DIAGNOSIS — E1169 Type 2 diabetes mellitus with other specified complication: Secondary | ICD-10-CM | POA: Diagnosis not present

## 2024-09-03 DIAGNOSIS — J449 Chronic obstructive pulmonary disease, unspecified: Secondary | ICD-10-CM | POA: Diagnosis not present

## 2024-09-15 DIAGNOSIS — E785 Hyperlipidemia, unspecified: Secondary | ICD-10-CM | POA: Diagnosis not present

## 2024-09-15 DIAGNOSIS — J449 Chronic obstructive pulmonary disease, unspecified: Secondary | ICD-10-CM | POA: Diagnosis not present

## 2024-09-15 DIAGNOSIS — E119 Type 2 diabetes mellitus without complications: Secondary | ICD-10-CM | POA: Diagnosis not present

## 2024-09-15 DIAGNOSIS — E1169 Type 2 diabetes mellitus with other specified complication: Secondary | ICD-10-CM | POA: Diagnosis not present

## 2024-09-15 DIAGNOSIS — I10 Essential (primary) hypertension: Secondary | ICD-10-CM | POA: Diagnosis not present

## 2024-09-20 ENCOUNTER — Other Ambulatory Visit: Payer: Self-pay

## 2024-09-20 ENCOUNTER — Telehealth: Payer: Self-pay

## 2024-09-20 MED ORDER — TRELEGY ELLIPTA 100-62.5-25 MCG/ACT IN AEPB: 1.0000 | INHALATION_SPRAY | Freq: Every day | RESPIRATORY_TRACT | 11 refills | Status: AC

## 2024-09-20 NOTE — Telephone Encounter (Signed)
 Copied from CRM [id number]. Topic: Clinical - Prescription Issue >> Sep 18, 2024 10:28 AM Rozanna MATSU wrote: Reason for CRM: Pt stated [facility] is requesting a new prescription and it has to be a cover sheet pts name, dob, address, and phone number faxed to [phone] and their phone number is [phone]. Pt would like a call back in regards to this please. >> Sep 20, 2024  8:13 AM Corean SAUNDERS wrote: Patient states she has called 3 times regarding the [employer] form - patient is very kind but is asking for someone to call her once this completed so that she may have some peace of mind.  >> Sep 19, 2024  9:12 AM Isabell A wrote: Patient wants to make sure prescription states 90 days supply.  >> Sep 19, 2024  8:12 AM Isabell A wrote: Patient is calling back for any updates.     Spoke with patient VBU, new Rx was faxed to [facility]

## 2024-11-08 ENCOUNTER — Telehealth: Payer: Self-pay | Admitting: Internal Medicine
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
## 2024-11-12 ENCOUNTER — Telehealth: Payer: Self-pay

## 2024-11-12 NOTE — Telephone Encounter (Signed)
 Received CMN from [facility] and placed in Dr. Adrien Guan sign folder.  CMN signed and faxed back to [facility] at [phone]

## 2024-11-25 ENCOUNTER — Encounter: Admitting: Pulmonary Disease

## 2025-01-01 ENCOUNTER — Encounter

## 2025-01-10 ENCOUNTER — Inpatient Hospital Stay: Admitting: Hematology and Oncology

## 2025-02-10 ENCOUNTER — Ambulatory Visit

## 2025-05-19 ENCOUNTER — Encounter (HOSPITAL_COMMUNITY)
